# Patient Record
Sex: Female | Born: 1940 | Race: White | Hispanic: No | Smoking: Never smoker
Health system: Southern US, Community
[De-identification: ages and names within clinical notes are randomized; demographics above are authoritative.]

## PROBLEM LIST (undated history)

## (undated) DIAGNOSIS — F419 Anxiety disorder, unspecified: Secondary | ICD-10-CM

## (undated) DIAGNOSIS — K219 Gastro-esophageal reflux disease without esophagitis: Secondary | ICD-10-CM

## (undated) DIAGNOSIS — F32A Depression, unspecified: Secondary | ICD-10-CM

## (undated) DIAGNOSIS — E049 Nontoxic goiter, unspecified: Secondary | ICD-10-CM

## (undated) DIAGNOSIS — K5790 Diverticulosis of intestine, part unspecified, without perforation or abscess without bleeding: Secondary | ICD-10-CM

## (undated) DIAGNOSIS — C50919 Malignant neoplasm of unspecified site of unspecified female breast: Secondary | ICD-10-CM

## (undated) DIAGNOSIS — D126 Benign neoplasm of colon, unspecified: Secondary | ICD-10-CM

## (undated) DIAGNOSIS — M4800 Spinal stenosis, site unspecified: Secondary | ICD-10-CM

## (undated) DIAGNOSIS — M1990 Unspecified osteoarthritis, unspecified site: Secondary | ICD-10-CM

## (undated) DIAGNOSIS — E7800 Pure hypercholesterolemia, unspecified: Secondary | ICD-10-CM

## (undated) DIAGNOSIS — G4730 Sleep apnea, unspecified: Secondary | ICD-10-CM

## (undated) DIAGNOSIS — I10 Essential (primary) hypertension: Secondary | ICD-10-CM

## (undated) DIAGNOSIS — H02402 Unspecified ptosis of left eyelid: Secondary | ICD-10-CM

## (undated) DIAGNOSIS — M109 Gout, unspecified: Secondary | ICD-10-CM

## (undated) DIAGNOSIS — R112 Nausea with vomiting, unspecified: Secondary | ICD-10-CM

## (undated) DIAGNOSIS — M48061 Spinal stenosis, lumbar region without neurogenic claudication: Secondary | ICD-10-CM

## (undated) DIAGNOSIS — Z9889 Other specified postprocedural states: Secondary | ICD-10-CM

## (undated) DIAGNOSIS — E669 Obesity, unspecified: Secondary | ICD-10-CM

## (undated) DIAGNOSIS — R269 Unspecified abnormalities of gait and mobility: Principal | ICD-10-CM

## (undated) DIAGNOSIS — R5382 Chronic fatigue, unspecified: Secondary | ICD-10-CM

## (undated) DIAGNOSIS — F329 Major depressive disorder, single episode, unspecified: Secondary | ICD-10-CM

## (undated) DIAGNOSIS — R413 Other amnesia: Secondary | ICD-10-CM

## (undated) DIAGNOSIS — K559 Vascular disorder of intestine, unspecified: Secondary | ICD-10-CM

## (undated) HISTORY — DX: Spinal stenosis, lumbar region without neurogenic claudication: M48.061

## (undated) HISTORY — DX: Benign neoplasm of colon, unspecified: D12.6

## (undated) HISTORY — PX: ABDOMINAL HYSTERECTOMY: SHX81

## (undated) HISTORY — DX: Unspecified osteoarthritis, unspecified site: M19.90

## (undated) HISTORY — DX: Gout, unspecified: M10.9

## (undated) HISTORY — DX: Anxiety disorder, unspecified: F41.9

## (undated) HISTORY — DX: Unspecified abnormalities of gait and mobility: R26.9

## (undated) HISTORY — DX: Vascular disorder of intestine, unspecified: K55.9

## (undated) HISTORY — DX: Chronic fatigue, unspecified: R53.82

## (undated) HISTORY — DX: Spinal stenosis, site unspecified: M48.00

## (undated) HISTORY — PX: TUBAL LIGATION: SHX77

## (undated) HISTORY — DX: Major depressive disorder, single episode, unspecified: F32.9

## (undated) HISTORY — DX: Obesity, unspecified: E66.9

## (undated) HISTORY — DX: Other amnesia: R41.3

## (undated) HISTORY — DX: Essential (primary) hypertension: I10

## (undated) HISTORY — PX: BREAST CYST EXCISION: SHX579

## (undated) HISTORY — DX: Pure hypercholesterolemia, unspecified: E78.00

## (undated) HISTORY — DX: Depression, unspecified: F32.A

## (undated) HISTORY — DX: Gastro-esophageal reflux disease without esophagitis: K21.9

## (undated) HISTORY — DX: Diverticulosis of intestine, part unspecified, without perforation or abscess without bleeding: K57.90

## (undated) HISTORY — DX: Malignant neoplasm of unspecified site of unspecified female breast: C50.919

## (undated) HISTORY — DX: Sleep apnea, unspecified: G47.30

---

## 1968-12-26 HISTORY — PX: TUBAL LIGATION: SHX77

## 1998-06-29 ENCOUNTER — Ambulatory Visit: Admission: RE | Admit: 1998-06-29 | Discharge: 1998-06-29 | Payer: Self-pay | Admitting: Pulmonary Disease

## 2001-09-25 ENCOUNTER — Encounter: Payer: Self-pay | Admitting: Internal Medicine

## 2001-09-25 ENCOUNTER — Ambulatory Visit (HOSPITAL_COMMUNITY): Admission: RE | Admit: 2001-09-25 | Discharge: 2001-09-25 | Payer: Self-pay | Admitting: Internal Medicine

## 2001-10-01 ENCOUNTER — Encounter: Payer: Self-pay | Admitting: Internal Medicine

## 2001-10-01 ENCOUNTER — Ambulatory Visit (HOSPITAL_COMMUNITY): Admission: RE | Admit: 2001-10-01 | Discharge: 2001-10-01 | Payer: Self-pay | Admitting: Internal Medicine

## 2001-10-12 ENCOUNTER — Encounter: Payer: Self-pay | Admitting: General Surgery

## 2001-10-12 ENCOUNTER — Ambulatory Visit (HOSPITAL_COMMUNITY): Admission: RE | Admit: 2001-10-12 | Discharge: 2001-10-12 | Payer: Self-pay | Admitting: General Surgery

## 2002-03-21 ENCOUNTER — Ambulatory Visit (HOSPITAL_COMMUNITY): Admission: RE | Admit: 2002-03-21 | Discharge: 2002-03-21 | Payer: Self-pay | Admitting: Orthopaedic Surgery

## 2003-02-05 ENCOUNTER — Encounter: Payer: Self-pay | Admitting: Internal Medicine

## 2003-02-05 ENCOUNTER — Ambulatory Visit (HOSPITAL_COMMUNITY): Admission: RE | Admit: 2003-02-05 | Discharge: 2003-02-05 | Payer: Self-pay | Admitting: Internal Medicine

## 2003-02-12 ENCOUNTER — Encounter: Payer: Self-pay | Admitting: Internal Medicine

## 2003-02-12 ENCOUNTER — Ambulatory Visit (HOSPITAL_COMMUNITY): Admission: RE | Admit: 2003-02-12 | Discharge: 2003-02-12 | Payer: Self-pay | Admitting: Internal Medicine

## 2003-08-21 ENCOUNTER — Encounter: Payer: Self-pay | Admitting: Internal Medicine

## 2003-08-21 ENCOUNTER — Ambulatory Visit (HOSPITAL_COMMUNITY): Admission: RE | Admit: 2003-08-21 | Discharge: 2003-08-21 | Payer: Self-pay | Admitting: Internal Medicine

## 2003-08-26 ENCOUNTER — Ambulatory Visit (HOSPITAL_COMMUNITY): Admission: RE | Admit: 2003-08-26 | Discharge: 2003-08-26 | Payer: Self-pay | Admitting: Internal Medicine

## 2003-08-26 ENCOUNTER — Encounter: Payer: Self-pay | Admitting: Internal Medicine

## 2003-12-05 ENCOUNTER — Ambulatory Visit (HOSPITAL_COMMUNITY): Admission: RE | Admit: 2003-12-05 | Discharge: 2003-12-05 | Payer: Self-pay | Admitting: Internal Medicine

## 2004-09-21 ENCOUNTER — Ambulatory Visit (HOSPITAL_COMMUNITY): Admission: RE | Admit: 2004-09-21 | Discharge: 2004-09-21 | Payer: Self-pay | Admitting: Internal Medicine

## 2005-11-23 ENCOUNTER — Ambulatory Visit (HOSPITAL_COMMUNITY): Admission: RE | Admit: 2005-11-23 | Discharge: 2005-11-23 | Payer: Self-pay | Admitting: Internal Medicine

## 2006-12-05 ENCOUNTER — Ambulatory Visit (HOSPITAL_COMMUNITY): Admission: RE | Admit: 2006-12-05 | Discharge: 2006-12-05 | Payer: Self-pay | Admitting: Internal Medicine

## 2007-08-26 ENCOUNTER — Inpatient Hospital Stay (HOSPITAL_COMMUNITY): Admission: EM | Admit: 2007-08-26 | Discharge: 2007-08-28 | Payer: Self-pay | Admitting: Emergency Medicine

## 2007-08-27 ENCOUNTER — Ambulatory Visit: Payer: Self-pay | Admitting: Internal Medicine

## 2007-08-27 DIAGNOSIS — D126 Benign neoplasm of colon, unspecified: Secondary | ICD-10-CM

## 2007-08-27 HISTORY — PX: COLONOSCOPY: SHX174

## 2007-08-27 HISTORY — DX: Benign neoplasm of colon, unspecified: D12.6

## 2007-08-28 ENCOUNTER — Ambulatory Visit: Payer: Self-pay | Admitting: Gastroenterology

## 2007-09-03 ENCOUNTER — Ambulatory Visit: Payer: Self-pay | Admitting: Internal Medicine

## 2007-09-03 ENCOUNTER — Ambulatory Visit (HOSPITAL_COMMUNITY): Admission: RE | Admit: 2007-09-03 | Discharge: 2007-09-03 | Payer: Self-pay | Admitting: Internal Medicine

## 2007-09-03 ENCOUNTER — Encounter: Payer: Self-pay | Admitting: Internal Medicine

## 2008-04-11 ENCOUNTER — Ambulatory Visit (HOSPITAL_COMMUNITY): Admission: RE | Admit: 2008-04-11 | Discharge: 2008-04-11 | Payer: Self-pay | Admitting: Internal Medicine

## 2009-05-11 ENCOUNTER — Ambulatory Visit (HOSPITAL_COMMUNITY): Admission: RE | Admit: 2009-05-11 | Discharge: 2009-05-11 | Payer: Self-pay | Admitting: Internal Medicine

## 2009-05-18 ENCOUNTER — Ambulatory Visit (HOSPITAL_COMMUNITY): Admission: RE | Admit: 2009-05-18 | Discharge: 2009-05-18 | Payer: Self-pay | Admitting: Internal Medicine

## 2009-10-28 ENCOUNTER — Emergency Department (HOSPITAL_COMMUNITY): Admission: EM | Admit: 2009-10-28 | Discharge: 2009-10-28 | Payer: Self-pay | Admitting: Emergency Medicine

## 2010-07-02 ENCOUNTER — Ambulatory Visit (HOSPITAL_COMMUNITY): Admission: RE | Admit: 2010-07-02 | Discharge: 2010-07-02 | Payer: Self-pay | Admitting: Internal Medicine

## 2011-05-10 NOTE — H&P (Signed)
NAMETEDRA, COPPERNOLL                ACCOUNT NO.:  [id number]   MEDICAL RECORD NO.:  [id number]          PATIENT TYPE:  INP   LOCATION:  A310                          FACILITY:  [facility]   PHYSICIAN:  [name]. [name], MD       DATE OF BIRTH:  08/24/1948   DATE OF ADMISSION:  08/26/2007  DATE OF DISCHARGE:  09/02/2008LH                              HISTORY & PHYSICAL   CHIEF COMPLAINT:  Rectal bleeding.   HISTORY OF PRESENT ILLNESS:  This patient is a 70 year old white female  who presented to the emergency room after developing bloody diarrhea.  The patient had  been in her usual state of health until the morning of  admission.  She had gone to church and about 10 minutes into the service  felt lower abdominal discomfort.  She left the service, went home and  then developed diarrhea and nausea and vomiting.  She then developed  rectal bleeding.  She denied fever.  She had previously had a  colonoscopy in 2004 which showed no polyps.  She was evaluated in the  emergency room and ultimately had a CT scan which revealed evidence of  thickening of the wall of the descending colon consistent with colitis.   PAST MEDICAL HISTORY:  1. Hypertension.  2. Hyperlipidemia.  3. Sleep apnea.  4. Multinodular goiter.  5. Spinal stenosis.  6. GERD.  7. Depression/fatigue.  8. Hysterectomy.  9. Negative breast biopsies.   MEDICATIONS:  1. Plendil 10 mg daily.  2. Micardis HCT 80/25mg  daily.  3. Wellbutrin SR 150 mg b.i.d.  4. Prozac 60 mg daily.  5. Ritalin 10 mg b.i.d.  6. Multivitamin daily.  7. Calcium and vitamin D b.i.d.  8. Claritin 10 mg daily.  9. __________ .  10.Prilosec 20 mg daily.   ALLERGIES:  None.   SOCIAL HISTORY:  She does not smoke or drink.  She lives in [place] with her husband.  She does not use recreational drugs.   FAMILY HISTORY:  Her father had heart disease.  Her mother has had  atrial flutter and a meningioma.  A brother died of non-Hodgkin's  lymphoma  and a brother has had diabetes and heart disease.   REVIEW OF SYSTEMS:  No syncope, shortness of breath or chest pain.  She  did initially develop a bout of sweating.  No dysuria or hematuria.   PHYSICAL EXAMINATION:  VITAL SIGNS:  Temperature 98.3, pulse 102,  respirations 20, blood pressure 162/83.  GENERAL:  Alert and oriented.  HEENT:  No scleral icterus.  Pharynx is unremarkable.  NECK:  No JVD.  She has a multinodular goiter.  LUNGS:  Clear.  HEART:  Regular with no murmurs.  ABDOMEN:  Active bowel sounds.  Nondistended.  No hepatosplenomegaly.  There is mild diffuse tenderness.  EXTREMITIES:  No cyanosis, clubbing or edema.  NEUROLOGIC:  Intact.  LYMPH NODES:  No cervical or supraclavicular enlargement.  SKIN:  Warm and dry.   LABORATORY DATA:  White count 9,  hemoglobin 13.1, platelets 183,000.  Sodium 138, potassium 3.3, bicarb 27, BUN 6,  creatinine 0.7, glucose  110, calcium 8.4.  Prothrombin time 12.6.  Urinalysis reveals 7-10 white  cells, 7-10 red cells, many bacteria, negative nitrite, and a trace  leukocyte esterase.   IMPRESSION:  1. Colitis,  likely ischemic in origin.  She was started empirically      in the emergency room on Cipro and Flagyl.  She has been treated      with morphine, if needed, and Zofran, if needed.  A GI consultation      has been requested.  Stool studies will be obtained.  2. Hypokalemia.  Will supplement intravenously.  3. Hypertension.  Continue Micardis and Plendil.  4. Sleep apnea.  Continue CPAP.  5. Multinodular goiter.  6. Spinal stenosis.  7. GERD.  Continue PPI.  8. History of depression/fatigue.  Continue Prozac, Wellbutrin SR and      Ritalin.      [name]. [name], MD  Electronically Signed     ROF/MEDQ  D:  08/27/2007  T:  08/28/2007  Job:  [id number]

## 2011-05-10 NOTE — Op Note (Signed)
Crystal [name], Crystal [name]                ACCOUNT NO.:  [id number]   MEDICAL RECORD NO.:  [id number]          PATIENT TYPE:  AMB   LOCATION:  DAY                           FACILITY:  [facility]   PHYSICIAN:  R. Roetta Sessions, M.D. DATE OF BIRTH:  1941-08-16   DATE OF PROCEDURE:  08/31/2007  DATE OF DISCHARGE:                               OPERATIVE REPORT   PROCEDURE:  Colonoscopy with biopsy and snare polypectomy.   INDICATIONS FOR PROCEDURE:  A 70 year old lady admitted to the hospital  recently with abdominal cramps, diarrhea followed by a gross  hematochezia. CT demonstrated thickening of the descending colon. Her  symptoms have resolved with conservative therapy which would be expected  with ischemic or segmental colitis. She does not smoke. Colonoscopy is  now being done to further evaluate her symptoms and CT findings. This  approach has been discussed with the patient at length. The potential  risks, benefits, and alternatives have been reviewed, questions answered  and she is agreeable. Please see documentation in the medical records.   MONITORING:  O2 saturation, blood pressure, pulse and respirations were  monitored throughout the entire procedure.   CONSCIOUS SEDATION:  Versed 6 mg IV, Demerol 100 mg IV in divided doses.   INSTRUMENT:  Pentax video chip system.   FINDINGS:  Digital rectal exam revealed no abnormalities.   ENDOSCOPIC FINDINGS:  The prep was good.   COLON:  The colonic mucosa was surveyed from the rectosigmoid junction  through the left transverse and right colon to the area of the  appendiceal orifice, ileocecal valve and cecum. These structures were  well seen and photographed for the record. From this level the scope was  slowly withdrawn and all previously mentioned mucosal surfaces were  again seen. The patient had extensive left-sided diverticula. She had  inflammatory changes involving a good 20-cm segment if descending colon  with granularity, friability  and loss of normal vascular pattern. There  were no ulcers and this segment was markedly inflamed up stream of the  splenic flexure. The colonic mucosa appeared entirely normal until the  cecum was reached. In the cecum, there was a 6-mm polyp in the base with  the cecum adjacent to the appendix. It was partially removed with cold  snare technique and subsequently cold biopsied. The inflamed segment of  descending colon was biopsied for histologic study. The remainder of the  colonic mucosa appeared normal. The scope was pulled into the rectum  where a thorough examination of the rectal mucosa including a rectal  view of the anal verge demonstrated no abnormalities. The patient  tolerated the procedure well and was reacted in endoscopy.   IMPRESSION:  Normal rectum, left-sided diverticula, inflamed segment of  descending colon consistent with segmental ischemic colon, it is  biopsied. Cecal polyp status post removal as described above.   RECOMMENDATIONS:  1. Diverticulosis literature provided to Ms. Brubacher. Followup on path.  2. Her segmental colitis should be a self limiting phenomena. If she      has any future symptoms then further evaluation of her mesenteric  vascular supply would be in order.      Jonathon Bellows, M.D.  Electronically Signed     RMR/MEDQ  D:  09/03/2007  T:  09/04/2007  Job:  [id number]   cc:   Rhae Lerner. Margretta Ditty, M.D.  [address]  [place]  [place] [address]   [name]. [name], MD  Fax: [phone]

## 2011-05-10 NOTE — Consult Note (Signed)
[name], [name]                ACCOUNT NO.:  [id number]   MEDICAL RECORD NO.:  [id number]          PATIENT TYPE:  INP   LOCATION:  A310                          FACILITY:  [facility]   PHYSICIAN:  R. Roetta Sessions, M.D. DATE OF BIRTH:  1941-05-29   DATE OF CONSULTATION:  08/27/2007  DATE OF DISCHARGE:                                 CONSULTATION   REASON FOR CONSULTATION:  1. Abdominal pain.  2. Rectal bleeding.  3. Abnormal CT.   HISTORY OF PRESENT ILLNESS:  Crystal [name] is a pleasant 70-year-  old Caucasian female, who was in her usual state of health until she was  in church yesterday, late morning, and developed lower abdominal cramps  and an urgent need to have a bowel movement.  She went home and had  multiple loose, non-bloody stools, which rapidly turned into gross  blood.  She has had multiple bloody bowel movements over the past 24  hours.  She presented to the ED last night and saw Dr. Margretta Ditty, where  she underwent evaluation.   She was found to have a white count of 9000, H&H of 13.5 and 39.5.  She  had some mild diffuse abdominal pain and she underwent a CT scan of the  abdomen and pelvis, which demonstrated thickening and inflammatory  changes involving the descending colon from hepatic flexure down to the  sigmoid.  No obvious abscess or anything concerning for neoplastic  process.  Dr. Margretta Ditty called me, where he had felt this was most likely  ischemic colitis and has set her up for a colonoscopy later in the week,  but this morning I came in and noted that she subsequently had been  admitted to the hospital.  She has remained quite stable over night.  This morning, her white count is 9000, her H&H is 13.1 and 38.2, MCH  91.2.  Abdominal cramps have diminished with morphine and she has had a  couple of episodes, small amounts of blood per rectum this morning.   She is a nonsmoker.  She has never had any similar symptoms previously.  In reviewing the old  records, it is notable that I performed a screening  colonoscopy on this nice lady back on December 05, 2003.  She had a long-  capacious colon with sigmoid diverticular.  It was recommended that she  return for a routine repeat colonoscopy in ten years.   PAST MEDICAL HISTORY:  Her past medical history is significant for:  1. Hypertension.  2. Gastroesophageal reflux disease.  3. She has a history of osteoarthritis.   PAST SURGICAL HISTORY:  Past surgeries include:  1. Bilateral breast biopsies for benign disease.  2. Bilateral tubal ligation.  3. Partial hysterectomy.   CURRENT MEDICATIONS:  Bupropion, fluoxetine, Omeprazole, felodipine, ASA  81 mg daily, Micardis and HCTZ.   ALLERGIES:  No known drug allergies.   FAMILY HISTORY:  Negative for chronic GI or liver illness, presumably  negative for colon cancer or inflammatory bowel disease.   REVIEW OF SYSTEMS:  Negative for chest pain, dyspnea on exertion, no  fever or  chills.  No odynophagia or dysphagia.  Has not had any melena.  Chronically, does not have any bowel dysfunction.   PHYSICAL EXAMINATION:  GENERAL APPEARANCE:  A 70 year old lady, resting  comfortably, accompanied by her husband and sister.  VITAL SIGNS:  Temperature 98.3, pulse of 90, respiratory rate of 20, BP  140/68.  SKIN:  Warm and dry.  There is no jaundice.  HEENT EXAM:  No scleral icterus.  Conjunctivae are pink.  Oral cavity  with no lesions.  CHEST:  Lungs are clear to auscultation.  HEART:  Regular rate and rhythm without murmurs, gallops or rubs.  ABDOMEN:  Obese.  Positive bowel sounds, soft, really nontender to deep  palpation without appreciable mass or organomegaly.  EXTREMITIES:  Have no edema.   LABORATORY DATA:  CBC is as outlined above.  BMP today, sodium 138,  potassium 3.3, chloride 104, CO2 27, glucose 110, BUN 6, calcium 8.4.  ProTime on admission 0.9.  Urinalysis is negative.   I have reviewed the CT scan via telephone with Dr.  Margo Aye, a radiologist  down in [place].  Currently, the CT scan images are not available on  the computer system here.  Again, this confirms the thickening and  inflammatory changes about the descending segment with sparing of the  more distant proximal colon.  No obvious metastatic disease and no  abscess.  The mesenteric vasculature is patent with minimal plaquing.   IMPRESSION:  Crystal [name] is a very pleasant 70 year old lady, who  most certainly has presented with ischemic or segmental colitis.  Her CT  findings and clinical presentation are classic.  She does not really  appear to be acutely toxic at this time.  We discussed outpatient  evaluation to some length last evening with Dr. Margretta Ditty.   RECOMMENDATIONS:  Would rapidly advance her from a clear liquid to a low-  residue diet in the next 24 hours.  She has been started on Cipro and  Flagyl, but the antibiotics are really superfluous in this setting and I  have taken the liberty of stopping them.  Hopefully, this nice lady can  go home within the next 24 hours on relatively mild oral analgesics.  We  will make plans to go ahead and perform a diagnostic colonoscopy in the  very near future (i.e.: at  the end of the week), to confirm the diagnosis.  I explained to Mrs.  Crystal [name] and her family members the underlying cause of this illness and it  is almost always self-limiting and non-recurrent.  Her outlook I feel is  excellent.   I would like to thank Dr. Carylon Perches for allowing me to see this nice  lady today.      Jonathon Bellows, M.D.  Electronically Signed     RMR/MEDQ  D:  08/27/2007  T:  08/27/2007  Job:  [id number]   cc:   Rhae Lerner. Margretta Ditty, M.D.  [address]  [place]  [place] [address]   [name]. [name], MD  Fax: [phone]

## 2011-05-10 NOTE — Discharge Summary (Signed)
Crystal [name], Crystal [name]                ACCOUNT NO.:  [id number]   MEDICAL RECORD NO.:  [id number]          PATIENT TYPE:  INP   LOCATION:  A310                          FACILITY:  [facility]   PHYSICIAN:  [name]. [name], MD       DATE OF BIRTH:  1941/05/12   DATE OF ADMISSION:  08/26/2007  DATE OF DISCHARGE:  09/02/2008LH                               DISCHARGE SUMMARY   DISCHARGE DIAGNOSES:  1. Ischemic colitis.  2. Hypertension.  3. Gastroesophageal reflux disease.  4. Hypokalemia.  5. Depression/fatigue.  6. Urinary tract infection.  7. Gallstones.  8. Degenerative joint disease/spinal stenosis.   PROCEDURES:  Abdominal and pelvic CT.   HOSPITAL COURSE:  This patient is a 70 year old female who presented  with abdominal pain and hematochezia.  Her hemoglobin was 13.5.  She was  normotensive and afebrile.  She underwent a CT scan of the abdomen and  pelvis in the ER and was found to have wall thickening of the descending  colon.  This was felt to be consistent with ischemic colitis.  She was  seen in consultation by Dr. Jena Gauss.  A followup colonoscopy is planned.  Her repeat hemoglobins remained in the normal range.  She was mildly  hyperkalemic at 3.3 and was supplemented intravenously.  Aspirin was  held.  Her symptoms improved.  She was felt be stable for discharge on  the 2nd.   Her abdominal CT also revealed a hiatal hernia, gallstones and DJD of  the spine.   Her urinalysis revealed 7-10 white cells and red cells with many  bacteria.   Stool studies were obtained and are pending except for the lactoferrin  which was positive.   DISCHARGE MEDICATIONS:  1. Cipro 250 b.i.d. for 1 week.  2. Wellbutrin SR 150 mg b.i.d.  3. Prozac 60 mg daily  4. Prilosec 20 mg daily  5. Loratadine 10 mg daily  6. Plendil 10 mg daily  7. Micardis HCT 80/25 mg daily  8. Calcium with vitamin D b.i.d.  9. Multivitamin daily  10.Ritalin 10 mg b.i.d.  11.Fish oil  12.Horse chestnut  13.Osteo  Bi-Flex  14.Tylenol as needed.  15.She will hold aspirin for 1 week.   FOLLOW UP:  She will be seen by Dr. Jena Gauss for colonoscopy.  She will  follow up with me as scheduled.  She was encouraged to eat a low residue  diet.      [name]. [name], MD  Electronically Signed     ROF/MEDQ  D:  08/28/2007  T:  08/28/2007  Job:  [id number]   cc:   R. Roetta Sessions, M.D.  [address]  [place]  [place] [address]

## 2011-05-13 NOTE — H&P (Signed)
[facility]  Patient:    Crystal [name], Crystal [name] Visit Number: [id number] MRN: [id number]          Service Type: OUT Location: RAD Attending Physician:  Carylon Perches Dictated by:   Franky Macho, M.D. Admit Date:  10/01/2001 Discharge Date: 10/01/2001   CC:         Carylon Perches, M.D.   History and Physical  AGE:  70 years old.  CHIEF COMPLAINT:  Abnormal mammogram of left breast.  HISTORY OF PRESENT ILLNESS:  Patient is a 70 year old white female who was referred for evaluation and treatment of an abnormal mammogram of the left breast.  Microcalcifications were seen in the left breast, new since previous mammogram.  She has a history of breast biopsy on both breasts in the past, negative for malignancy.  She does not feel a lump.  No nipple discharge or family history of breast carcinoma is noted.  She has three children, first pregnant at age 35, never breast-fed.  She had a hysterectomy 12 years ago.  PAST MEDICAL HISTORY:  Hypertension, reflux disease, osteoarthritis.  PAST SURGICAL HISTORY:  Right breast biopsy, left breast biopsy, partial hysterectomy.  CURRENT MEDICATIONS:  Effexor, hydrochlorothiazide, Paxil, Plendil, Nexium, baby aspirin, calcium supplements, Unicap vitamin.  ALLERGIES:  No known drug allergies.  REVIEW OF SYSTEMS:  Patient denies drinking or smoking.  She denies any other cardiopulmonary difficulties, MI, chest pain, breathing disorder, shortness of breath, CVA, or diabetes mellitus.  PHYSICAL EXAMINATION:  GENERAL:  Patient is a well-developed, well-nourished white female in no acute distress.  She is afebrile.  VITAL SIGNS:  Stable.  LUNGS:  Clear to auscultation with equal breath sounds bilaterally.  HEART:  Examination reveals a regular rate and rhythm without S3, S4, or murmurs.  NECK:  Supple without lymphadenopathy.  BREASTS:  Right breast examination reveals no dominant mass, nipple discharge, or dimpling.  The  axilla is negative for palpable nodes.  Left breast examination reveals no dominant mass, nipple discharge, or dimpling.  The axilla is negative for palpable nodes.  LABORATORY DATA:  Mammography reveals suspicious microcalcifications in the lateral aspect of the left breast, new since 2001.  IMPRESSION:  Left breast mass, nonpalpable.  PLAN:  The patient is scheduled to undergo left breast biopsy after needle localization on October 12, 2001.  The risks and benefits of the procedure including bleeding and infection were fully explained to the patient, gained informed consent. Dictated by:   Franky Macho, M.D. Attending Physician:  Carylon Perches DD:  10/09/01 TD:  10/09/01 Job: [id number]

## 2011-05-13 NOTE — Op Note (Signed)
[facility]  Patient:    Crystal [name], Crystal [name] Visit Number: [id number] MRN: [id number]          Service Type: DSU Location: DAY Attending Physician:  Dalia Heading Dictated by:   Franky Macho, M.D. Proc. Date: 10/12/01 Admit Date:  10/12/2001   CC:         Carylon Perches, M.D.   Operative Report  AGE:  70 years old.  PREOPERATIVE DIAGNOSIS:  Left breast mass, nonpalpable.  POSTOPERATIVE DIAGNOSIS:  Left breast mass, nonpalpable.  PROCEDURE:  Left breast biopsy after needle localization.  SURGEON:  Franky Macho, M.D.  ANESTHESIA:  MAC.  INDICATIONS:  Patient is a 70 year old white female who presents with new microcalcifications in the left breast.  The risks and benefits of the procedure including bleeding, infection, and recurrence of the mass were fully explained to the patient, who gave informed consent.  DESCRIPTION OF PROCEDURE:  Patient was placed in the supine position after undergoing needle localization in the x-ray department.  The left breast was prepped and draped using the usual sterile technique with Betadine.  Xylocaine 1% was used for local anesthesia.  An incision was made where the guidewire was placed, which was at the 2 oclock position, just outside the areola.  The dissection was taken down and the guidewire was followed to a representative section of breast tissue. This was sent and the initial biopsy did not have the microcalcifications contained.  Additional breast tissue was removed and this did contain the microcalcifications that were suspicious.  Any bleeding was controlled using Bovie electrocautery.  The skin was closed using a 4-0 Vicryl subcuticular suture.  Steri-Strips and dry sterile dressing were applied.  All ______ and needle counts were correct at the end of the procedure.  The patient was awakened and transferred to PACU in stable condition.  COMPLICATIONS:  None.  SPECIMEN:  Left breast  mass.  BLOOD LOSS:  Minimal. Dictated by:   Franky Macho, M.D. Attending Physician:  Dalia Heading DD:  10/12/01 TD:  10/13/01 Job: [id number]

## 2011-05-13 NOTE — Op Note (Signed)
NAME:  Crystal [name], Crystal [name]                          ACCOUNT NO.:  [id number]   MEDICAL RECORD NO.:  [id number]                   PATIENT TYPE:  AMB   LOCATION:  DAY                                  FACILITY:  [facility]   PHYSICIAN:  R. Roetta Sessions, M.D.              DATE OF BIRTH:  1941/03/04   DATE OF PROCEDURE:  12/05/2003  DATE OF DISCHARGE:                                 OPERATIVE REPORT   PROCEDURE:  Screening colonoscopy.   INDICATIONS FOR PROCEDURE:  The patient is a 70 year old lady devoid of any  lower GI tract symptoms who is sent over by the courtesy of Dr. Carylon Perches  for colorectal cancer screening.  There is no family history of colorectal  neoplasia.  She has sigmoidoscopy performed by Dr. [name] previously but has  never had her entire lower GI tract imaged.  Consequently, colonoscopy is  now being performed.  This approach has been discussed with the patient at  length previously and again today at the bedside.  The potential risks,  benefits, and alternatives have been reviewed and questions answered.  She  is agreeable.  Please see my handwritten H&P for more information.   PROCEDURE:  O2 saturation, blood pressure, pulses, and respirations were  monitored throughout the entire procedure.  Conscious sedation was with  Versed 4 mg IV, Demerol 100 mg IV in divided doses.  The instrument used was  the Olympus video chip pediatric colonoscope.   FINDINGS:  Digital rectal examination revealed no abnormalities.   ENDOSCOPIC FINDINGS:  The prep was marginal, particularly in the right  colon.   Rectum:  Examination of the rectal mucosa including retroflex view of the  anal verge revealed no abnormalities.   Colon:  The colonic mucosa was surveyed from the rectosigmoid junction  through the left, transverse, right colon to the area of the appendiceal  orifice, ileocecal valve, and cecum.  These structures were well-seen and  photographed for the record.  From this level, the  scope was slowly  withdrawn.  All previously mentioned mucosal surfaces were again seen.   The patient had a relatively tight sigmoid colon with numerous left-sided  diverticula.  The more proximal colon was somewhat elongated and capacious.  The mucosa, however, appeared normal.  The patient tolerated the procedure  well and was reactive in endoscopy.   IMPRESSION:  1. Normal rectum.  2. Sigmoid diverticula.  3. Elongated, tortuous, capacious more proximal colon.  Otherwise normal     mucosa.   RECOMMENDATIONS:  1. Diverticulosis literature provided to Ms. Moronta.  2. Repeat colonoscopy in 10 years.      ___________________________________________                                            Jonathon Bellows, M.D.  RMR/MEDQ  D:  12/05/2003  T:  12/05/2003  Job:  [id number]   cc:   [name]. [name], M.D.  [address]  [place]  [place] [address]  Fax: [phone]

## 2011-05-28 ENCOUNTER — Inpatient Hospital Stay (HOSPITAL_COMMUNITY)
Admission: EM | Admit: 2011-05-28 | Discharge: 2011-05-30 | DRG: 394 | Disposition: A | Discharge status: Home / Self Care | Payer: Medicare Other | Attending: Internal Medicine | Admitting: Internal Medicine

## 2011-05-28 ENCOUNTER — Emergency Department (HOSPITAL_COMMUNITY): Payer: Medicare Other

## 2011-05-28 DIAGNOSIS — I10 Essential (primary) hypertension: Secondary | ICD-10-CM | POA: Diagnosis present

## 2011-05-28 DIAGNOSIS — K559 Vascular disorder of intestine, unspecified: Secondary | ICD-10-CM

## 2011-05-28 DIAGNOSIS — R5383 Other fatigue: Secondary | ICD-10-CM | POA: Diagnosis present

## 2011-05-28 DIAGNOSIS — E876 Hypokalemia: Secondary | ICD-10-CM | POA: Diagnosis not present

## 2011-05-28 DIAGNOSIS — F329 Major depressive disorder, single episode, unspecified: Secondary | ICD-10-CM | POA: Diagnosis present

## 2011-05-28 DIAGNOSIS — R109 Unspecified abdominal pain: Secondary | ICD-10-CM

## 2011-05-28 DIAGNOSIS — N390 Urinary tract infection, site not specified: Secondary | ICD-10-CM | POA: Diagnosis present

## 2011-05-28 DIAGNOSIS — K219 Gastro-esophageal reflux disease without esophagitis: Secondary | ICD-10-CM | POA: Diagnosis present

## 2011-05-28 DIAGNOSIS — R5381 Other malaise: Secondary | ICD-10-CM | POA: Diagnosis present

## 2011-05-28 DIAGNOSIS — M1990 Unspecified osteoarthritis, unspecified site: Secondary | ICD-10-CM | POA: Diagnosis present

## 2011-05-28 DIAGNOSIS — F3289 Other specified depressive episodes: Secondary | ICD-10-CM | POA: Diagnosis present

## 2011-05-28 LAB — TYPE AND SCREEN: Antibody Screen: NEGATIVE

## 2011-05-28 LAB — BASIC METABOLIC PANEL
BUN: 13 mg/dL (ref 6–23)
CO2: 28 mEq/L (ref 19–32)
Calcium: 10.2 mg/dL (ref 8.4–10.5)
Chloride: 97 mEq/L (ref 96–112)
GFR calc Af Amer: >60 mL/min (ref >60)
GFR calc non Af Amer: >60 mL/min (ref >60)
Sodium: 137 mEq/L (ref 135–145)

## 2011-05-28 LAB — URINALYSIS, ROUTINE W REFLEX MICROSCOPIC
Bilirubin Urine: NEGATIVE
Nitrite: NEGATIVE
Protein, ur: 100 mg/dL — AB
Urobilinogen, UA: 1 mg/dL (ref 0.0–1.0)
pH: 8.5 — ABNORMAL HIGH (ref 5.0–8.0)

## 2011-05-28 LAB — HEPATIC FUNCTION PANEL
ALT: 23 U/L (ref 0–35)
AST: 26 U/L (ref 0–37)
Bilirubin, Direct: 0.2 mg/dL (ref 0.0–0.3)

## 2011-05-28 LAB — URINE MICROSCOPIC-ADD ON

## 2011-05-28 LAB — DIFFERENTIAL
Basophils Relative: 0 % (ref 0–1)
Lymphs Abs: 1 10*3/uL (ref 0.7–4.0)
Monocytes Absolute: 0.9 10*3/uL (ref 0.1–1.0)
Neutrophils Relative %: 87 % — ABNORMAL HIGH (ref 43–77)

## 2011-05-28 LAB — CBC
HCT: 41.2 % (ref 36.0–46.0)
MCV: 92.6 fL (ref 78.0–100.0)
Platelets: 223 10*3/uL (ref 150–400)
RDW: 13.1 % (ref 11.5–15.5)

## 2011-05-28 MED ORDER — IOHEXOL 300 MG/ML  SOLN
100.0000 mL | Freq: Once | INTRAMUSCULAR | Status: AC | PRN
  Administered 2011-05-28: 100 mL via INTRAVENOUS

## 2011-05-29 LAB — DIFFERENTIAL
Basophils Absolute: 0 10*3/uL (ref 0.0–0.1)
Basophils Relative: 0 % (ref 0–1)
Lymphs Abs: 2.8 10*3/uL (ref 0.7–4.0)
Monocytes Absolute: 0.9 10*3/uL (ref 0.1–1.0)
Monocytes Relative: 8 % (ref 3–12)
Neutrophils Relative %: 67 % (ref 43–77)

## 2011-05-29 LAB — CBC
HCT: 37.6 % (ref 36.0–46.0)
MCHC: 33.5 g/dL (ref 30.0–36.0)
MCV: 93.3 fL (ref 78.0–100.0)
Platelets: 172 10*3/uL (ref 150–400)
RDW: 13.3 % (ref 11.5–15.5)
WBC: 11.6 10*3/uL — ABNORMAL HIGH (ref 4.0–10.5)

## 2011-05-29 LAB — URINE CULTURE: Colony Count: 30000

## 2011-05-29 LAB — BASIC METABOLIC PANEL
BUN: 7 mg/dL (ref 6–23)
Chloride: 99 mEq/L (ref 96–112)
Creatinine, Ser: 0.73 mg/dL (ref 0.4–1.2)
GFR calc non Af Amer: >60 mL/min (ref >60)
Potassium: 3.1 mEq/L — ABNORMAL LOW (ref 3.5–5.1)

## 2011-05-30 ENCOUNTER — Encounter: Payer: Self-pay | Admitting: Urgent Care

## 2011-05-30 ENCOUNTER — Telehealth: Payer: Self-pay | Admitting: Urgent Care

## 2011-05-30 LAB — CBC
HCT: 34.9 % — ABNORMAL LOW (ref 36.0–46.0)
Hemoglobin: 11.7 g/dL — ABNORMAL LOW (ref 12.0–15.0)
MCHC: 33.5 g/dL (ref 30.0–36.0)
MCV: 93.3 fL (ref 78.0–100.0)
RBC: 3.74 MIL/uL — ABNORMAL LOW (ref 3.87–5.11)

## 2011-05-30 LAB — DIFFERENTIAL
Eosinophils Relative: 4 % (ref 0–5)
Monocytes Relative: 10 % (ref 3–12)

## 2011-05-30 NOTE — Telephone Encounter (Signed)
Pt is aware of OV for 06/27/11 @ 3 with KJ

## 2011-05-30 NOTE — Telephone Encounter (Signed)
Pt being d/c'd from [facility] today. Will need FU OV w/ [place] 4 weeks re:FU ischemic colitis

## 2011-06-12 NOTE — Discharge Summary (Signed)
  [name], Crystal [name]                ACCOUNT NO.:  [id number]  MEDICAL RECORD NO.:  [id number]  LOCATION:  A302                          FACILITY:  [facility]  PHYSICIAN:  [name]. [name], MD       DATE OF BIRTH:  [birth year]  DATE OF ADMISSION:  05/28/2011 DATE OF DISCHARGE:  06/04/2012LH                              DISCHARGE SUMMARY   DISCHARGE DIAGNOSES: 1. Ischemic colitis. 2. Hypertension. 3. Depression. 4. Gastroesophageal reflux disease. 5. Hypokalemia. 6. Chronic fatigue. 7. Urinary tract infection. 8. Osteoarthritis. 9. Spinal stenosis.  PROCEDURES:  CT of the abdomen and pelvis.  DISCHARGE MEDICATIONS: 1. Cipro 500 mg b.i.d. for 5 days. 2. Bupropion SR 150 mg b.i.d. 3. Calcium carbonate b.i.d. 4. Felodipine 10 mg daily. 5. Fish oil 1000 mg b.i.d. 6. Fluoxetine 60 mg daily. 7. Hydrocodone/homatropine 5/1.5, 1 teaspoon q.4 p.r.n. 8. Loratadine 10 mg daily. 9. Micardis HCT 80/25 mg daily. 10.Omeprazole 20 mg daily. 11.Simvastatin 20 mg daily. 12.Tylenol Arthritis 2 tablets b.i.d. 13.Stop methylphenidate.  HOSPITAL COURSE:  This patient is a 70 year old female with history of ischemic colitis 4 years ago who presented with left-sided abdominal pain followed by rectal bleeding.  She presented to the emergency room where she was found to have a hemoglobin of 14 with a white count of 14.9.  She underwent a CT scan of the abdomen and pelvis which revealed diffuse acute colitis in the descending colon.  There is no evidence of perforation or obstruction.  She was seen in Gastroenterology consultation by Dr. Karilyn Cota.  Her hemoglobin dropped to 12.6, then 11.7. Her bleeding stopped.  Her abdominal pain resolved.  She was hemodynamically stable.  Her white count normalized at 8.6.  She had an abnormal urinalysis on admission with 21-50 white cells and too numerous to count RBCs and many bacteria.  Her culture has shown 30,000 colonies of multiple species.  She has been treated  with Cipro.  Her diet has been advanced to full liquids which she has tolerated well. She was hyperkalemic at 3.1 on the third.  She has been supplemented with oral potassium.  Her potassium had been normal at 3.6 on admission.  Her condition is significantly improved.  She is stable for discharge on the 4th.  She will be seen in followup in my office in a week.  She will stop aspirin.  She will also stop methylphenidate.     [name]. [name], MD     ROF/MEDQ  D:  05/30/2011  T:  05/30/2011  Job:  [id number]  Electronically Signed by Carylon Perches MD on 06/12/2011 10:43:48 AM

## 2011-06-20 NOTE — H&P (Signed)
  NAMEELEXIS, POLLAK                ACCOUNT NO.:  [id number]  MEDICAL RECORD NO.:  [id number]           PATIENT TYPE:  I  LOCATION:  A302                          FACILITY:  [facility]  PHYSICIAN:  GEBRESELASSIE NIDA, MD DATE OF BIRTH:  [birth year]  DATE OF ADMISSION:  05/28/2011 DATE OF DISCHARGE:  LH                             HISTORY & PHYSICAL   REASON FOR HOSPITALIZATION:  Descending colitis and urinary tract infection.  HISTORY OF PRESENT ILLNESS:  This is a 70 year old Caucasian female with medical history significant for hypertension, anxiety, prior episode of descending colitis   Dictation ended at this point.                                           ______________________________ Purcell Nails, MD     GN/MEDQ  D:  05/28/2011  T:  05/29/2011  Job:  [id number]  Electronically Signed by Purcell Nails MD on 06/20/2011 10:22:11 AM

## 2011-06-20 NOTE — H&P (Signed)
Crystal [name], HOLBEIN                ACCOUNT NO.:  [id number]  MEDICAL RECORD NO.:  [id number]           PATIENT TYPE:  I  LOCATION:  A302                          FACILITY:  [facility]  PHYSICIAN:  Tatyana Biber, MD DATE OF BIRTH:  14-Aug-1941  DATE OF ADMISSION:  05/28/2011 DATE OF DISCHARGE:  LH                             HISTORY & PHYSICAL   REASON FOR ADMISSION:  Colitis and urinary tract infection.  HISTORY OF PRESENT ILLNESS:  She is a 70 year old female patient with medical history significant for hypertension, hyperlipidemia, prior diverticulosis, and descending colitis who comes in complaining of abdominal pain which was progressive in onset, pattern was waxing and waning, mainly on the left lower quadrant.  Since last night, she had hematochezia of approximately 6 times, which triggered the ER visit. She did not have any vomiting, however, had some loose stools associated with the rectal bleeding.  Denies any trauma.  The patient had significant prior similar history approximately 5 years ago for which she was treated for descending colitis.  PAST MEDICAL HISTORY:  As above.  PAST SURGICAL HISTORY:  Colonoscopy, hysterectomy, and tubal ligation.  SOCIAL HISTORY:  Negative for smoking, alcohol, or drug use.  REVIEW OF SYSTEMS:  Negative for fevers or chills.  No nausea.  No vomiting.  No cough.  No shortness of breath.  She had abdominal pain, see HPI.  She has no rash.  No polyuria or polydipsia, however, she had sign of urinary tract infection on ER evaluation.  FAMILY HISTORY:  Noncontributory.  HOME MEDICATIONS: 1. Loratadine 10 mg once a day. 2. Fluoxetine 60 mg once a day. 3. Simvastatin 20 mg once a day. 4. Tylenol Arthritis. 5. Hydrocodone. 6. Calcium plus D. 7. Osteo Bi-Flex. 8. Fish oil. 9. Felodipine. 10.Omeprazole. 11.Methylene. 12.Bupropion. 13.Aspirin. 14.Micardis.  PHYSICAL EXAMINATION:  GENERAL:  She is alert and oriented x3. VITAL SIGNS:   Blood pressure 153/76, pulse rate 73, temperature 98.4, and oxygen saturation 95. HEENT:  Dry mucous membranes. NECK:  Negative for JVD or thyromegaly. CHEST:  Clear to auscultation bilaterally. CARDIOVASCULAR:  Normal S1 and S2.  No murmur, no gallop. ABDOMEN:  Tender on the left lower quadrant.  No rebound tenderness.  No sign of organomegaly or ascites. EXTREMITIES:  No edema. CENTRAL NERVOUS SYSTEM:  Nonfocal.  She moves all extremities with no limitation.  LABORATORY DATA:  Sodium 137, potassium 3.6, chloride 97, bicarb 28, BUN 13, creatinine 0.78, lipase 10, calcium 10.2.  AST and ALT normal.  WBC 14.9, hemoglobin 14, hematocrit 41, and platelet count 223.  Her CT of abdomen and pelvis with contrast showed diffuse acute colitis of the left descending colon, etiology unclear, suspect infectious inflammatory or ischemic.  She has no associated obstruction, perforation, or fluid collection.  She had small hiatal hernia and cystic enlargement of the right kidney.  ASSESSMENT AND PLAN: 1. 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  I will     keep the patient n.p.o., however, with dextrose support and     adequate hydration.  She will be allowed for ice chips by mouth.     Surgical consult  is already requested.  She may need mesenteric     angiography to evaluate atherosclerotic occlusion leading to     ischemic colitis. 2. Urinary tract infection as documented by urine studies which showed     21-50 wbc per high-powered field considered significant as well as     many bacteria per high powered field.  I will continue with Cipro     400 mg IV b.i.d. 3. Hypertension.  We will continue on her home medications involving     Micardis and felodipine. 4. Hyperlipidemia.  We will continue her home medications, simvastatin     20 mg daily. 5. DVT prophylaxis will not be done due to rectal bleeding. 6. Rectal bleeding due to descending colitis.  We will continue to      monitor hemoglobin/hematocrit every 12 hours. 7. Diet and nutrition.  N.p.o. for bowel rest and awaiting surgical     evaluation.                                           ______________________________ Purcell Nails, MD     GN/MEDQ  D:  05/28/2011  T:  05/29/2011  Job:  [id number]  Electronically Signed by Purcell Nails MD on 06/20/2011 10:22:06 AM

## 2011-06-26 NOTE — Consult Note (Signed)
NAMEGERLINE, Crystal [name]                ACCOUNT NO.:  [id number]  MEDICAL RECORD NO.:  [id number]           PATIENT TYPE:  I  LOCATION:  A302                          FACILITY:  [facility]  PHYSICIAN:  Lionel December, M.D.    DATE OF BIRTH:  May 16, 1941  DATE OF CONSULTATION:  05/28/2011 DATE OF DISCHARGE:                                CONSULTATION   REASON FOR CONSULTATION:  Ischemic colitis.  HISTORY OF PRESENT ILLNESS:  Crystal [name] is a 70 year old Caucasian female, who is admitted to [facility] via emergency room by Dr. Fransico Him, who is covering for Dr. [name].  She was in usual state of health until about 3 p.m. yesterday when she noted severe abdominal cramps.  She went to the bathroom, but did not have a bowel movement.  She then developed nausea and vomiting and began to have bowel movements.  Initially she had normal stool and then loose stools and then she began to pass blood. She had multiple bowel movements during the night.  She states she was up all night.  She came to emergency room about 7 a.m.  She was evaluated.  She had a leukocytosis with normal H and H.  She had abdominopelvic CT, which revealed thickening to descending colon, changes felt to be consistent with ischemic colitis.  She had similar changes on CT of September 2008 when she was admitted for similar condition.  The patient states that she is feeling better.  Her abdominal soreness is decreased.  She is hungry.  She was able to tolerate clear liquids. Since she has been on the floor she has had about 6 bowel movements and each time she has passed small clots in blood that is not large amount. She states she ate out yesterday and had lunch consisting of fried fat meat.  She states that her sister-in-law did not get sick though.  No history of prior antibiotic use or travel out of country.  She says generally her bowels move regularly.  She does not take any OTC NSAIDs. She has had good appetite and denies any  involuntary weight loss.  HOME MEDICATIONS: 1. Tylenol Arthritis 1.3 g b.i.d. 2. Micardis HCT 80/25 daily. 3. Methylphenidate 10 mg p.o. b.i.d. 4. Bupropion 150 mg p.o. b.i.d. 5. Fluoxetine 60 mg daily. 6. Fish oil 1 g p.o. b.i.d. 7. Hydrocodone/homatropine liquid 5 mg/1.5 mg per 5 mL q.4 p.r.n. for     cough. 8. Felodipine 10 mg p.o. daily. 9. Simvastatin 10 mg p.o. daily. 10.ASA 81 mg daily. 11.Calcium carbonate 1 tablet b.i.d. 12.Loratadine 10 mg daily p.r.n. 13.Omeprazole 20 mg p.o. q.a.m.  CURRENT MEDICATIONS: 1. Cipro 400 mg IV q.12 h. 2. Benicar 40 mg p.o. daily. 3. Bupropion 150 mg p.o. b.i.d. 4. Fluoxetine 60 mg p.o. daily. 5. She is also on IV fluids. 6. Zofran 4 mg IV q.6 p.r.n.  PAST MEDICAL HISTORY:  She has been hypertensive for more than 20 years. She has had symptoms of GERD for 2-3 years.  She has osteoarthrosis primarily involving joints of her hands and knees, but lately it has been doing well.  She has  history of depression and stress disorder. She has sleep apnea and uses CPAP.  History of obesity.  She states her peak weight was 270 pounds, she is now down to less than 240.  History of ischemic colitis in September 2008 when she had colonoscopy by Dr. Jena Gauss and had a 6-mm adenoma removed from her cecum and she had typical changes of ischemic colitis involving descending colon and she also had left-sided diverticulosis.  Her prior colonoscopy was in December 2004, also by Dr. Jena Gauss, for screening, and no polyps were found.  She has had 3 benign lesions removed from her breast, one from the right side and two from the left.  She had tubal ligation in 1970 and hysterectomy about 20 years ago.  History of mild thyroid enlargement possibly goiter.  ALLERGIES:  NK.  FAMILY HISTORY:  One brother died within a year of diagnosis of cancer at age 42, no details are available.  She has a brother age 4  who has had CABG and he is also diabetic.  Sister is in  good health.  SOCIAL HISTORY:  She is widowed.  She has three children, one daughter has fibromyalgia, other daughter and son are in good health.  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  She has been retired since 1998.  She has never smoked cigarettes or drank alcohol.  PHYSICAL EXAMINATION:  VITAL SIGNS:  Admission weight 110.7 kg, she is 65 inches tall, pulse 73 per minute, blood pressure 128/72, temperature is 98.4, and respirations 18. HEENT:  Conjunctivae is pink.  Sclerae nonicteric.  Oral pharyngeal mucosa is normal. NECK:  No neck masses or thyromegaly evident.  She has multiple small skin tags, primarily in the right side of the neck. CARDIAC:  With regular rhythm.  Normal S1 and S2.  No murmur or gallop noted. LUNGS:  Clear to auscultation. ABDOMEN:  Full, bowel sounds are normal.  No bruits noted.  On palpation, soft abdomen with mild-to-moderate tenderness in left upper quadrant without guarding or rebound.  No organomegaly or masses. RECTAL:  Deferred.  No peripheral edema or clubbing noted.  She does have some prominence to second MCP joints in both hands.  LAB DATA FROM ADMISSION:  WBC 14.9, H and H is 14 and 41.2, platelet count is 223 K, segs 87%, sodium 137, potassium 3.6, chloride 97, CO2 is 28, glucose 136, BUN 13, creatinine 0.78, bilirubin is 100, AP 84, SGOT 26, SGPT 23, total protein 8 with albumin of 4.6, calcium 10.2. Urinalysis revealed 21-50 WBCS, many bacteria, and urine culture is pending.  Abdominopelvic CT reviewed, it shows thickening to most of the descending colon with stranding surrounding colonic wall, no fluid collection noted.  Both celiac trunk and SMA wide opened.  There is some calcification at take off of celiac trunk.  ASSESSMENT:  Crystal [name] is a 70 year old Caucasian female who has typical signs and symptoms and CT findings of ischemic colitis.  This is  a second episode.  Her first episode was in September 2008 and confirmed via colonoscopy.  She does not appear to have obvious trigger or etiology.  While infection needs to be ruled out going through list of medicines my only concern would be one with methylphenidate.  Therefore, if possible this medicine should be discontinued.  She appears to be quite stable and I do not feel there is any need or indication for colonoscopy.  She possibly need one for surveillance in September next year.  The patient is on IV Cipro primarily for urinary tract infection, it may have some role in her ischemic colitis by causing decontamination and reducing the risk of translocation, bacteria, etc.  I do not feel we need to add any other agent at this time.  RECOMMENDATIONS: 1. Clear liquids. 2. We will repeat her CBC and BMET in a.m.  We will also obtain stool     sample for culture and O and P when she has diarrhea or nonbloody     stool. 3. The patient will be reevaluated in a.m. 4. We appreciate the opportunity to participate in the care of this     nice lady.          ______________________________ Lionel December, M.D.     NR/MEDQ  D:  05/28/2011  T:  05/29/2011  Job:  [id number]  cc:   [name]. [name], MD Fax: [phone]  R. Roetta Sessions, MD FACP [facility] [address] [place] [address]  Electronically Signed by Lionel December M.D. on 06/26/2011 12:36:20 PM

## 2011-06-27 ENCOUNTER — Encounter: Payer: Self-pay | Admitting: Urgent Care

## 2011-06-27 ENCOUNTER — Ambulatory Visit (INDEPENDENT_AMBULATORY_CARE_PROVIDER_SITE_OTHER): Payer: PRIVATE HEALTH INSURANCE | Admitting: Urgent Care

## 2011-06-27 VITALS — BP 153/72 | HR 78 | Temp 98.6°F | Ht 65.0 in | Wt 249.8 lb

## 2011-06-27 DIAGNOSIS — K559 Vascular disorder of intestine, unspecified: Secondary | ICD-10-CM

## 2011-06-27 DIAGNOSIS — D369 Benign neoplasm, unspecified site: Secondary | ICD-10-CM

## 2011-06-27 NOTE — Assessment & Plan Note (Signed)
Next colonoscopy 08/2012.

## 2011-06-27 NOTE — Patient Instructions (Signed)
Call if any further problems or to ER

## 2011-06-27 NOTE — Progress Notes (Signed)
Cc to Dr. Fagan 

## 2011-06-27 NOTE — Progress Notes (Signed)
Referring Provider: Carmie End), MD Primary Care Physician:  Carylon Perches, MD Primary Gastroenterologist:  Dr. Jena Gauss  Chief Complaint  Patient presents with  . Follow-up    ischemic colitis    HPI:  Crystal [name] is a 70 y.o. female here for follow up for ischemic colitis.  2nd bout of ischemic colitis admitted to [facility] 6/2.  She responded to supportive measures.  Last colonoscopy as below in 2008.  CT->left-sided colitis, celiac trunk/IMA wide open.  Some calcification off take off celiac trunk.  Pt denies diarrhea, rectal bleeding or constipation,  Appetite "too good."  Denies abd pain.  Overall, feeling very well.  Doing Zumba on a regular basis. Past Medical History  Diagnosis Date  . 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 Diverticulosis   . Tubular adenoma of colon 08/2007  . HTN (hypertension)   . Depression   . GERD (gastroesophageal reflux disease)   . Chronic fatigue   . OA (osteoarthritis)   . Spinal stenosis   . Sleep apnea     cpap  . Obesity    Past Surgical History  Procedure Date  . Breast cyst excision     benign  . Tubal ligation 1970    hysterectomy   Current Outpatient Prescriptions  Medication Sig Dispense Refill  . acetaminophen (TYLENOL ARTHRITIS PAIN) 650 MG CR tablet Take 650 mg by mouth 4 (four) times daily. 2 tabs in the morning and 2 tabs at night       . ALLERGY RELIEF 10 MG tablet       . aspirin 81 MG tablet Take 81 mg by mouth daily.        Marland Kitchen buPROPion (WELLBUTRIN SR) 150 MG 12 hr tablet       . calcium carbonate (OS-CAL) 600 MG TABS Take 600 mg by mouth 2 (two) times daily with a meal.        . felodipine (PLENDIL) 10 MG 24 hr tablet       . fish oil-omega-3 fatty acids 1000 MG capsule Take 2 g by mouth daily.        Marland Kitchen FLUoxetine (PROZAC) 20 MG capsule       . hydrochlorothiazide 25 MG tablet       . MICARDIS HCT 80-25 MG per tablet       . omeprazole (PRILOSEC) 20 MG capsule         Allergies as of 06/27/2011  . (No Known Allergies)   Review of Systems: Gen: Denies any fever, chills, sweats, anorexia, fatigue, weakness, malaise, weight loss, and sleep disorder CV: Denies chest pain, angina, palpitations, syncope, orthopnea, PND, peripheral edema, and claudication. Resp: Denies dyspnea at rest, dyspnea with exercise, cough, sputum, wheezing, coughing up blood, and pleurisy. GI: Denies vomiting blood, jaundice, and fecal incontinence.   Denies dysphagia or odynophagia. Derm: Denies rash, itching, dry skin, hives, moles, warts, or unhealing ulcers.  Psych: Denies depression, anxiety, memory loss, suicidal ideation, hallucinations, paranoia, and confusion. Heme: Denies bruising, bleeding, and enlarged lymph nodes.  Physical Exam: BP 153/72  Pulse 78  Temp(Src) 98.6 F (37 C) (Temporal)  Ht 5\' 5"  (1.651 m)  Wt 249 lb 12.8 oz (113.309 kg)  BMI 41.57 kg/m2 General:   Alert,  Well-developed, obese, pleasant and cooperative in NAD Head:  Normocephalic and atraumatic. Eyes:  Sclera clear, no icterus.   Conjunctiva pink. Mouth:  No deformity or lesions, dentition normal. Neck:  Supple; no  masses or thyromegaly. Heart:  Regular rate and rhythm; no murmurs, clicks, rubs,  or gallops. Abdomen:  Obese, Soft, nontender and nondistended. No masses, hepatosplenomegaly or hernias noted. Normal bowel sounds, without guarding, and without rebound.   Msk:  Symmetrical without gross deformities. Normal posture. Pulses:  Normal pulses noted. Extremities:  Without clubbing or edema. Neurologic:  Alert and  oriented x4;  grossly normal neurologically. Skin:  Intact without significant lesions or rashes. Cervical Nodes:  No significant cervical adenopathy. Psych:  Alert and cooperative. Normal mood and affect.

## 2011-06-27 NOTE — Assessment & Plan Note (Signed)
Resolved w/ supportive measures.  No distinct trigger.  2nd bout of ischemic colitis.  No significant mesenteric ischemia.  Hopefully, she will not have any further episodes.

## 2011-07-05 NOTE — Progress Notes (Signed)
Episode of ischemic colitis? 2o to volume depletion. Pt taking HCTZ 25 mg daily PLUS 25 mg in her MICARDIS. Advise pt to stay hydrated especially when she exercises. She needs to make sure her urine is light yellow. She may want to talk with Dr. [name] about alternative BP med. Continue ASA.

## 2011-07-13 NOTE — Progress Notes (Signed)
Reminder for TCS in epic

## 2011-09-14 ENCOUNTER — Other Ambulatory Visit (HOSPITAL_COMMUNITY): Payer: Self-pay | Admitting: Internal Medicine

## 2011-09-14 DIAGNOSIS — Z139 Encounter for screening, unspecified: Secondary | ICD-10-CM

## 2011-09-19 ENCOUNTER — Ambulatory Visit (HOSPITAL_COMMUNITY)
Admission: RE | Admit: 2011-09-19 | Discharge: 2011-09-19 | Disposition: A | Discharge status: Home / Self Care | Payer: Medicare Other | Source: Ambulatory Visit | Attending: Internal Medicine | Admitting: Internal Medicine

## 2011-09-19 DIAGNOSIS — Z1231 Encounter for screening mammogram for malignant neoplasm of breast: Secondary | ICD-10-CM | POA: Insufficient documentation

## 2011-09-19 DIAGNOSIS — Z139 Encounter for screening, unspecified: Secondary | ICD-10-CM

## 2011-10-07 LAB — URINE CULTURE

## 2011-10-07 LAB — CBC
HCT: 38.2
HCT: 36.8
HCT: 39.5
Hemoglobin: 12.6
MCHC: 34.1
MCHC: 34.4
MCV: 91.2
MCV: 90.1
MCV: 91
Platelets: 183
RBC: 4.08
RBC: 4.34
RDW: 13

## 2011-10-07 LAB — BASIC METABOLIC PANEL
BUN: 6
BUN: 16
CO2: 26
Chloride: 103
Creatinine, Ser: 0.74
GFR calc Af Amer: >60
GFR calc non Af Amer: >60
Glucose, Bld: 110 — ABNORMAL HIGH
Potassium: 3.3 — ABNORMAL LOW

## 2011-10-07 LAB — GIARDIA/CRYPTOSPORIDIUM SCREEN(EIA): Giardia Screen - EIA: NEGATIVE

## 2011-10-07 LAB — DIFFERENTIAL
Basophils Absolute: 0
Basophils Relative: 0
Basophils Relative: 0
Eosinophils Absolute: 0.1
Eosinophils Absolute: 0
Eosinophils Absolute: 0.1
Eosinophils Relative: 1
Eosinophils Relative: 0
Eosinophils Relative: 1
Lymphocytes Relative: 19
Lymphs Abs: 1
Monocytes Absolute: 0.4
Monocytes Absolute: 0.6
Monocytes Relative: 5
Monocytes Relative: 7
Neutrophils Relative %: 85 — ABNORMAL HIGH

## 2011-10-07 LAB — FECAL LACTOFERRIN, QUANT

## 2011-10-07 LAB — STOOL CULTURE

## 2011-10-07 LAB — PROTIME-INR: INR: 0.9

## 2011-10-07 LAB — CLOSTRIDIUM DIFFICILE EIA

## 2011-10-07 LAB — URINALYSIS, ROUTINE W REFLEX MICROSCOPIC
Protein, ur: NEGATIVE
Urobilinogen, UA: 0.2

## 2011-10-07 LAB — URINE MICROSCOPIC-ADD ON

## 2011-11-23 ENCOUNTER — Encounter (HOSPITAL_COMMUNITY): Payer: Self-pay | Admitting: Pharmacy Technician

## 2011-11-24 ENCOUNTER — Encounter (HOSPITAL_COMMUNITY): Payer: Self-pay

## 2011-11-24 ENCOUNTER — Encounter (HOSPITAL_COMMUNITY)
Admission: RE | Admit: 2011-11-24 | Discharge: 2011-11-24 | Disposition: A | Discharge status: Home / Self Care | Payer: Medicare Other | Source: Ambulatory Visit | Attending: Ophthalmology | Admitting: Ophthalmology

## 2011-11-24 HISTORY — DX: Nausea with vomiting, unspecified: Z98.890

## 2011-11-24 HISTORY — DX: Other specified postprocedural states: R11.2

## 2011-11-24 LAB — BASIC METABOLIC PANEL
CO2: 30 mEq/L (ref 19–32)
GFR calc non Af Amer: 70 mL/min — ABNORMAL LOW (ref >90)
Glucose, Bld: 85 mg/dL (ref 70–99)
Potassium: 4.1 mEq/L (ref 3.5–5.1)
Sodium: 138 mEq/L (ref 135–145)

## 2011-11-24 LAB — CBC
Hemoglobin: 13.3 g/dL (ref 12.0–15.0)
RBC: 4.22 MIL/uL (ref 3.87–5.11)

## 2011-11-24 NOTE — Patient Instructions (Signed)
20 SINIYAH EVANGELIST  11/24/2011   Your procedure is scheduled on:  Thursday, 12/01/11  Report to [facility] at 11:00 AM.  Call this number if you have problems the morning of surgery: [phone]   Remember:   Do not eat food:After Midnight.  May have clear liquids:until Midnight .  Clear liquids include soda, tea, black coffee, apple or grape juice, broth.  Take these medicines the morning of surgery with A SIP OF WATER: prozac, micardis, wellbutrin, prilosec   Do not wear jewelry, make-up or nail polish.  Do not wear lotions, powders, or perfumes. You may wear deodorant.  Do not shave 48 hours prior to surgery.  Do not bring valuables to the hospital.  Contacts, dentures or bridgework may not be worn into surgery.  Leave suitcase in the car. After surgery it may be brought to your room.  For patients admitted to the hospital, checkout time is 11:00 AM the day of discharge.   Patients discharged the day of surgery will not be allowed to drive home.  Name and phone number of your driver: driver  Special Instructions: Use eye drops as directed.   Please read over the following fact sheets that you were given: Anesthesia Post-op Instructions   Cataract A cataract is a clouding of the lens of the eye. It is most often related to aging. A cataract is not a "film" over the surface of the eye. The lens is inside the eye and changes size of the pupil. The lens can enlarge to let more light enter the eye in dark environments and contract the size of the pupil to let in bright light. The lens is the part of the eye that helps focus light on the retina. The retina is the eye's light-sensitive layer. It is in the back of the eye that sends visual signals to the brain. In a normal eye, light passes through the lens and gets focused on the retina. To help produce a sharp image, the lens must remain clear. When a lens becomes cloudy, vision is compromised by the degree and nature of the clouding. Certain  cataracts make people more near-sighted as they develop, others increase glare, and all reduce vision to some degree or another. A cataract that is so dense that it becomes milky white and a white opacity can be seen through the pupil. When the white color is seen, it is called a "mature" or "hyper-mature cataract." Such cataracts cause total blindness in the affected eye. The cataract must be removed to prevent damage to the eye itself. Some types of cataracts can cause a secondary disease of the eye, such as certain types of glaucoma. In the early stages, better lighting and eyeglasses may lessen vision problems caused by cataracts. At a certain point, surgery may be needed to improve vision. CAUSES   Aging. However, cataracts may occur at any age, even in newborns.   Certain drugs.   Trauma to the eye.   Certain diseases (such as diabetes).   Inherited or acquired medical syndromes.  SYMPTOMS   Gradual, progressive drop in vision in the affected eye. Cataracts may develop at different rates in each eye. Cataracts may even be in just one eye with the other unaffected.   Cataracts due to trauma may develop quickly, sometimes over a matter or days or even hours. The result is severe and rapid visual loss.  DIAGNOSIS  To detect a cataract, an eye doctor examines the lens. A well developed cataract  can be diagnosed without dilating the pupil. Early cataracts and others of a specific nature are best diagnosed with an exam of the eyes with the pupils dilated by drops. TREATMENT   For an early cataract, vision may improve by using different eyeglasses or stronger lighting.   If the above measures do not help, surgery is the only effective treatment. This treatment removes the cloudy lens and replaces it with a substitute lens (Intraocular lens, or IOL). Newly developed IOL technology allows the implanted lens to improve vision both at a distance and up close. Discuss with your eye surgeon about  the possibility of still needing glasses. Also discuss how visual coordination between both eyes will be affected.  A cataract needs to be removed only when vision loss interferes with your everyday activities such as driving, reading or watching TV. You and your eye doctor can make that decision together. In most cases, waiting until you are ready to have cataract surgery will not harm your eye. If you have cataracts in both eyes, only one should be removed at a time. This allows the operated eye to heal and be out of danger from serious problems (such as infection or poor wound healing) before having the other eye undergo surgery.  Sometimes, a cataract should be removed even if it does not cause problems with your vision. For example, a cataract should be removed if it prevents examination or treatment of another eye problem. Just as you cannot see out of the affected eye well, your doctor cannot see into your eye well through a cataract. The vast majority of people who have cataract surgery have better vision afterward. CATARACT REMOVAL There are two primary ways to remove a cataract. Your doctor can explain the differences and help determine which is best for you:  Phacoemulsification (small incision cataract surgery). This involves making a small cut (incision) on the edge of the clear, dome-shaped surface that covers the front of the eye (the cornea). An injection behind the eye or eye drops are given to make this a painless procedure. The doctor then inserts a tiny probe into the eye. This device emits ultrasound waves that soften and break up the cloudy center of the lens so it can be removed by suction. Most cataract surgery is done this way. The cuts are usually so small and performed in such a manner that often no sutures are needed to keep it closed.   Extracapsular surgery. Your doctor makes a slightly longer incision on the side of the cornea. The doctor removes the hard center of the lens.  The remainder of the lens is then removed by suction. In some cases, extremely fine sutures are needed which the doctor may, or may not remove in the office after the surgery.  When an IOL is implanted, it needs no care. It becomes a permanent part of your eye and cannot be seen or felt.  Some people cannot have an IOL. They may have problems during surgery, or maybe they have another eye disease. For these people, a soft contact lens may be suggested. If an IOL or contact lens cannot be used, very powerful and thick glasses are required after surgery. Since vision is very different through such thick glasses, it is important to have your doctor discuss the impact on your vision after any cataract surgery where there is no plan to implant an IOL. The normal lens of the eye is covered by a clear capsule. Both phacoemulsification and extracapsular surgery  require that the back surface of this lens capsule be left in place. This helps support IOLs and prevents the IOL from dislocating and falling back into the deeper interior of the eye. Right after surgery, and often permanently this "posterior capsule" remains clear. In some cases however, it can become cloudy, presenting the same type of visual compromise that the original cataract did since light is again obstructed as it passes through the clear IOL. This condition is often referred to as an "after-cataract." Fortunately, after-cataracts are easily treated using a painless and very fast laser treatment that is performed without anesthesia or incisions. It is done in a matter of minutes in an outpatient environment. Visual improvement is often immediate.  HOME CARE INSTRUCTIONS   Your surgeon will discuss pre and post operative care with you prior to surgery. The majority of people are able to do almost all normal activities right away. Although, it is often advised to avoid strenuous activity for a period of time.   Postoperative drops and careful  avoidance of infection will be needed. Many surgeons suggest the use of a protective shield during the first few days after surgery.   There is a very small incidence of complication from modern cataract surgery, but it can happen. Infection that spreads to the inside of the eye (endophthalmitis) can result in total visual loss and even loss of the eye itself. In extremely rare instances, the inflammation of endophthalmitis can spread to both eyes (sympathetic ophthalmia). Appropriate post-operative care under the close observation of your surgeon is essential to a successful outcome.  SEEK IMMEDIATE MEDICAL CARE IF:   You have any sudden drop of vision in the operated eye.   You have pain in the operated eye.   You see a large number of floating dots in the field of vision in the operated eye.   You see flashing lights, or if a portion of your side vision in any direction appears black (like a curtain being drawn into your field of vision) in the operated eye.  Document Released: 12/12/2005 Document Revised: 08/24/2011 Document Reviewed: 01/28/2008 [facility] Patient Information 2012 [place], [place].

## 2011-12-01 ENCOUNTER — Encounter (HOSPITAL_COMMUNITY): Payer: Self-pay | Admitting: Anesthesiology

## 2011-12-01 ENCOUNTER — Encounter (HOSPITAL_COMMUNITY): Payer: Self-pay | Admitting: Ophthalmology

## 2011-12-01 ENCOUNTER — Ambulatory Visit (HOSPITAL_COMMUNITY)
Admission: RE | Admit: 2011-12-01 | Discharge: 2011-12-01 | Disposition: A | Discharge status: Home / Self Care | Payer: Medicare Other | Source: Ambulatory Visit | Attending: Ophthalmology | Admitting: Ophthalmology

## 2011-12-01 ENCOUNTER — Encounter (HOSPITAL_COMMUNITY)
Admission: RE | Disposition: A | Discharge status: Home / Self Care | Payer: Self-pay | Source: Ambulatory Visit | Attending: Ophthalmology

## 2011-12-01 ENCOUNTER — Ambulatory Visit (HOSPITAL_COMMUNITY): Payer: Medicare Other | Admitting: Anesthesiology

## 2011-12-01 DIAGNOSIS — I10 Essential (primary) hypertension: Secondary | ICD-10-CM | POA: Insufficient documentation

## 2011-12-01 DIAGNOSIS — Z79899 Other long term (current) drug therapy: Secondary | ICD-10-CM | POA: Insufficient documentation

## 2011-12-01 DIAGNOSIS — Z01812 Encounter for preprocedural laboratory examination: Secondary | ICD-10-CM | POA: Insufficient documentation

## 2011-12-01 DIAGNOSIS — H2510 Age-related nuclear cataract, unspecified eye: Secondary | ICD-10-CM | POA: Insufficient documentation

## 2011-12-01 HISTORY — PX: CATARACT EXTRACTION W/PHACO: SHX586

## 2011-12-01 SURGERY — PHACOEMULSIFICATION, CATARACT, WITH IOL INSERTION
Anesthesia: Monitor Anesthesia Care | Site: Eye | Laterality: Right | Wound class: Clean

## 2011-12-01 MED ORDER — PHENYLEPHRINE HCL 2.5 % OP SOLN
1.0000 [drp] | OPHTHALMIC | Status: AC
  Administered 2011-12-01 (×3): 1 [drp] via OPHTHALMIC

## 2011-12-01 MED ORDER — NEOMYCIN-POLYMYXIN-DEXAMETH 3.5-10000-0.1 OP OINT
TOPICAL_OINTMENT | OPHTHALMIC | Status: AC
  Filled 2011-12-01: qty 3.5

## 2011-12-01 MED ORDER — LACTATED RINGERS IV SOLN
INTRAVENOUS | Status: DC | PRN
  Administered 2011-12-01: 12:00:00 via INTRAVENOUS

## 2011-12-01 MED ORDER — CYCLOPENTOLATE-PHENYLEPHRINE 0.2-1 % OP SOLN
OPHTHALMIC | Status: AC
  Administered 2011-12-01: 1 [drp] via OPHTHALMIC
  Filled 2011-12-01: qty 2

## 2011-12-01 MED ORDER — TETRACAINE HCL 0.5 % OP SOLN
OPHTHALMIC | Status: AC
  Administered 2011-12-01: 1 [drp] via OPHTHALMIC
  Filled 2011-12-01: qty 2

## 2011-12-01 MED ORDER — EPINEPHRINE HCL 1 MG/ML IJ SOLN
INTRAOCULAR | Status: DC | PRN
  Administered 2011-12-01: 13:00:00

## 2011-12-01 MED ORDER — BSS IO SOLN
INTRAOCULAR | Status: DC | PRN
  Administered 2011-12-01: 15 mL via INTRAOCULAR

## 2011-12-01 MED ORDER — LIDOCAINE HCL (PF) 1 % IJ SOLN
INTRAMUSCULAR | Status: AC
  Filled 2011-12-01: qty 2

## 2011-12-01 MED ORDER — LIDOCAINE HCL 3.5 % OP GEL
1.0000 "application " | Freq: Once | OPHTHALMIC | Status: AC
  Administered 2011-12-01: 1 via OPHTHALMIC

## 2011-12-01 MED ORDER — LIDOCAINE HCL (PF) 1 % IJ SOLN
INTRAMUSCULAR | Status: DC | PRN
  Administered 2011-12-01: .4 mL

## 2011-12-01 MED ORDER — MIDAZOLAM HCL 2 MG/2ML IJ SOLN
1.0000 mg | INTRAMUSCULAR | Status: DC | PRN
  Administered 2011-12-01: 2 mg via INTRAVENOUS

## 2011-12-01 MED ORDER — MIDAZOLAM HCL 2 MG/2ML IJ SOLN
INTRAMUSCULAR | Status: AC
  Filled 2011-12-01: qty 2

## 2011-12-01 MED ORDER — NEOMYCIN-POLYMYXIN-DEXAMETH 0.1 % OP OINT
TOPICAL_OINTMENT | OPHTHALMIC | Status: DC | PRN
  Administered 2011-12-01: 1 via OPHTHALMIC

## 2011-12-01 MED ORDER — PHENYLEPHRINE HCL 2.5 % OP SOLN
OPHTHALMIC | Status: AC
  Administered 2011-12-01: 1 [drp] via OPHTHALMIC
  Filled 2011-12-01: qty 2

## 2011-12-01 MED ORDER — EPINEPHRINE HCL 1 MG/ML IJ SOLN
INTRAMUSCULAR | Status: AC
  Filled 2011-12-01: qty 1

## 2011-12-01 MED ORDER — LIDOCAINE 3.5 % OP GEL OPTIME - NO CHARGE
OPHTHALMIC | Status: DC | PRN
  Administered 2011-12-01: 1 [drp] via OPHTHALMIC

## 2011-12-01 MED ORDER — CYCLOPENTOLATE-PHENYLEPHRINE 0.2-1 % OP SOLN
1.0000 [drp] | OPHTHALMIC | Status: AC
  Administered 2011-12-01 (×3): 1 [drp] via OPHTHALMIC

## 2011-12-01 MED ORDER — PROVISC 10 MG/ML IO SOLN: INTRAOCULAR | Status: DC | PRN

## 2011-12-01 MED ORDER — SODIUM HYALURONATE 16 MG/ML IO SOLN
INTRAOCULAR | Status: DC | PRN
  Administered 2011-12-01: 0.8 mL via INTRAOCULAR

## 2011-12-01 MED ORDER — POVIDONE-IODINE 5 % OP SOLN
OPHTHALMIC | Status: DC | PRN
  Administered 2011-12-01: 1 via OPHTHALMIC

## 2011-12-01 MED ORDER — LACTATED RINGERS IV SOLN
INTRAVENOUS | Status: DC
  Administered 2011-12-01: 1000 mL via INTRAVENOUS

## 2011-12-01 MED ORDER — LIDOCAINE HCL 3.5 % OP GEL
OPHTHALMIC | Status: AC
  Administered 2011-12-01: 1 via OPHTHALMIC
  Filled 2011-12-01: qty 5

## 2011-12-01 MED ORDER — TETRACAINE HCL 0.5 % OP SOLN
1.0000 [drp] | OPHTHALMIC | Status: AC
  Administered 2011-12-01 (×3): 1 [drp] via OPHTHALMIC

## 2011-12-01 SURGICAL SUPPLY — 33 items
CAPSULAR TENSION RING-AMO (OPHTHALMIC RELATED) IMPLANT
CLOTH BEACON ORANGE TIMEOUT ST (SAFETY) ×2 IMPLANT
EYE SHIELD UNIVERSAL CLEAR (GAUZE/BANDAGES/DRESSINGS) ×2 IMPLANT
GLOVE BIO SURGEON STRL SZ 6.5 (GLOVE) IMPLANT
GLOVE BIOGEL PI IND STRL 6.5 (GLOVE) ×2 IMPLANT
GLOVE BIOGEL PI IND STRL 7.0 (GLOVE) IMPLANT
GLOVE BIOGEL PI IND STRL 7.5 (GLOVE) IMPLANT
GLOVE BIOGEL PI INDICATOR 6.5 (GLOVE) ×2
GLOVE BIOGEL PI INDICATOR 7.0 (GLOVE)
GLOVE BIOGEL PI INDICATOR 7.5 (GLOVE)
GLOVE ECLIPSE 6.5 STRL STRAW (GLOVE) IMPLANT
GLOVE ECLIPSE 7.0 STRL STRAW (GLOVE) IMPLANT
GLOVE ECLIPSE 7.5 STRL STRAW (GLOVE) IMPLANT
GLOVE EXAM NITRILE LRG STRL (GLOVE) IMPLANT
GLOVE EXAM NITRILE MD LF STRL (GLOVE) ×2 IMPLANT
GLOVE SKINSENSE NS SZ6.5 (GLOVE)
GLOVE SKINSENSE NS SZ7.0 (GLOVE)
GLOVE SKINSENSE STRL SZ6.5 (GLOVE) IMPLANT
GLOVE SKINSENSE STRL SZ7.0 (GLOVE) IMPLANT
GOWN PREVENTION PLUS XLARGE (GOWN DISPOSABLE) ×2 IMPLANT
KIT VITRECTOMY (OPHTHALMIC RELATED) IMPLANT
PAD ARMBOARD 7.5X6 YLW CONV (MISCELLANEOUS) ×2 IMPLANT
PROC W NO LENS (INTRAOCULAR LENS)
PROC W SPEC LENS (INTRAOCULAR LENS)
PROCESS W NO LENS (INTRAOCULAR LENS) IMPLANT
PROCESS W SPEC LENS (INTRAOCULAR LENS) IMPLANT
RING MALYGIN (MISCELLANEOUS) IMPLANT
SIGHTPATH CAT PROC W REG LENS (Ophthalmic Related) ×2 IMPLANT
SYR TB 1ML LL NO SAFETY (SYRINGE) ×2 IMPLANT
TAPE SURG TRANSPORE 1 IN (GAUZE/BANDAGES/DRESSINGS) ×1 IMPLANT
TAPE SURGICAL TRANSPORE 1 IN (GAUZE/BANDAGES/DRESSINGS) ×1
VISCOELASTIC ADDITIONAL (OPHTHALMIC RELATED) IMPLANT
WATER STERILE IRR 250ML POUR (IV SOLUTION) ×2 IMPLANT

## 2011-12-01 NOTE — Anesthesia Procedure Notes (Signed)
Procedure Name: MAC Date/Time: 12/01/2011 12:32 PM Performed by: Carolyne Littles, Cloy Cozzens Pre-anesthesia Checklist: Patient identified, Patient being monitored, Emergency Drugs available, Timeout performed and Suction available Oxygen Delivery Method: Nasal Cannula

## 2011-12-01 NOTE — Anesthesia Postprocedure Evaluation (Signed)
  Anesthesia Post-op Note  Patient: Crystal [name]  Procedure(s) Performed:  CATARACT EXTRACTION PHACO AND INTRAOCULAR LENS PLACEMENT (IOC) - CDE=14.87  Patient Location: PACU and Short Stay  Anesthesia Type: MAC  Level of Consciousness: awake, alert  and oriented  Airway and Oxygen Therapy: Patient Spontanous Breathing  Post-op Pain: none  Post-op Assessment: Post-op Vital signs reviewed, Patient's Cardiovascular Status Stable and Respiratory Function Stable  Post-op Vital Signs: Reviewed and stable  Complications: No apparent anesthesia complications

## 2011-12-01 NOTE — H&P (Signed)
I have reviewed the H&P, the patient was re-examined, and I have identified no interval changes in medical condition and plan of care since the history and physical of record  

## 2011-12-01 NOTE — Transfer of Care (Signed)
Immediate Anesthesia Transfer of Care Note  Patient: Crystal [name]  Procedure(s) Performed:  CATARACT EXTRACTION PHACO AND INTRAOCULAR LENS PLACEMENT (IOC) - CDE=14.87  Patient Location: Short Stay  Anesthesia Type: MAC  Level of Consciousness: awake, alert  and oriented  Airway & Oxygen Therapy: Patient Spontanous Breathing  Post-op Assessment: Report given to PACU RN and Post -op Vital signs reviewed and stable  Post vital signs: Reviewed and stable  Complications: No apparent anesthesia complications

## 2011-12-01 NOTE — Anesthesia Preprocedure Evaluation (Signed)
Anesthesia Evaluation  Patient identified by MRN, date of birth, ID band  History of Anesthesia Complications (+) PONV  Airway Mallampati: II      Dental  (+) Teeth Intact   Pulmonary sleep apnea and Continuous Positive Airway Pressure Ventilation ,    Pulmonary exam normal       Cardiovascular hypertension, Pt. on medications Regular Normal    Neuro/Psych PSYCHIATRIC DISORDERS Depression    GI/Hepatic GERD-  Medicated and Controlled,  Endo/Other    Renal/GU      Musculoskeletal   Abdominal   Peds  Hematology   Anesthesia Other Findings   Reproductive/Obstetrics                           Anesthesia Physical Anesthesia Plan  ASA: III  Anesthesia Plan: MAC   Post-op Pain Management:    Induction:   Airway Management Planned: Nasal Cannula  Additional Equipment:   Intra-op Plan:   Post-operative Plan:   Informed Consent: I have reviewed the patients History and Physical, chart, labs and discussed the procedure including the risks, benefits and alternatives for the proposed anesthesia with the patient or authorized representative who has indicated his/her understanding and acceptance.     Plan Discussed with:   Anesthesia Plan Comments:         Anesthesia Quick Evaluation

## 2011-12-01 NOTE — Discharge Instructions (Signed)
Crystal [name]  12/01/2011     Instructions  1. Use medications as Instructed.  Shake well before use. Wait 5 minutes between drops.  {OPHTHALMIC ANTIBIOTICS:22167} 4 times a day x 1 week.  {OPHTHALMIC ANTI-INFLAMMATORY:22168} 2 times a day x 4 weeks.  {OPHTHALMIC STEROID:22169} 4 times a day - week 1   3 times a day - Week 2, 2 times a day- Week 3, 1 time a day - Week 4.  2. Do not rub the operative eye. Do not swim underwater for 2 weeks.  3. You may remove the clear shield and resume your normal activities the day after  Surgery. Your eyes may feel more comfortable if you wear dark glasses outside.  4. Call our office at [phone] if you have sudden change in vision, extreme redness or pain. Some fluctuation in vision is normal after surgery. If you have an emergency after hours, call Dr. Alto Denver at [phone].  5. It is important that you attend all of your follow-up appointments.        Follow-up:{follow up:32580} with Gemma Payor.   Dr. Lahoma Crocker: [phone]  Dr. Lita Mains: [phone]  Dr. Alto Denver: [phone]   If you find that you cannot contact your physician, but feel that your signs and   Symptoms warrant a physician's attention, call the Emergency Room at   [phone].   Other{NA AND GUYQIHKV:42595}.

## 2011-12-01 NOTE — Brief Op Note (Signed)
Pre-Op Dx: Cataract OD Post-Op Dx: Cataract OD Surgeon: Angelyse Heslin Anesthesia: Topical with MAC Implant: Lenstec, Model Softec HD Blood Loss: None Specimen: None Complications: None 

## 2011-12-01 NOTE — Preoperative (Signed)
Beta Blockers   Reason not to administer Beta Blockers:Not Applicable 

## 2011-12-02 NOTE — Op Note (Signed)
Crystal [name], Crystal [name] NO.:  [id number]  MEDICAL RECORD NO.:  [id number]  LOCATION:  [facility]                          FACILITY:  [facility]  PHYSICIAN:  Susanne Greenhouse, MD       DATE OF BIRTH:  [birth year]  DATE OF PROCEDURE:  12/01/2011 DATE OF DISCHARGE:  12/01/2011                              OPERATIVE REPORT   PREOPERATIVE DIAGNOSIS:  Nuclear cataract, right eye.  Diagnosis code 366.16.  POSTOPERATIVE DIAGNOSIS:  Nuclear cataract, right eye.  Diagnosis code 366.16.  OPERATION PERFORMED:  Phacoemulsification with posterior chamber intraocular lens implantation, right eye.  SURGEON:  Bonne Dolores. Meliss Fleek, MD  ANESTHESIA:  General endotracheal anesthesia.  OPERATIVE SUMMARY:  In the preoperative area, dilating drops were placed into the right eye.  The patient was then brought into the operating room where she was placed under general anesthesia.  The eye was then prepped and draped.  Beginning with a 75 blade, a paracentesis port was made at the surgeon's 2 o'clock position.  The anterior chamber was then filled with a 1% nonpreserved lidocaine solution with epinephrine.  This was followed by Viscoat to deepen the chamber.  A small fornix-based peritomy was performed superiorly.  Next, a single iris hook was placed through the limbus superiorly.  A 2.4-mm keratome blade was then used to make a clear corneal incision over the iris hook.  A bent cystotome needle and Utrata forceps were used to create a continuous tear capsulotomy.  Hydrodissection was performed using balanced salt solution on a fine cannula.  The lens nucleus was then removed using phacoemulsification in a quadrant cracking technique.  The cortical material was then removed with irrigation and aspiration.  The capsular bag and anterior chamber were refilled with Provisc.  The wound was widened to approximately 3 mm and a posterior chamber intraocular lens was placed into the capsular bag without difficulty  using an [employer] lens injecting system.  A single 10-0 nylon suture was then used to close the incision as well as stromal hydration.  The Provisc was removed from the anterior chamber and capsular bag with irrigation and aspiration.  At this point, the wounds were tested for leak, which were negative.  The anterior chamber remained deep and stable.  The patient tolerated the procedure well.  There were no operative complications, and she awoke from general anesthesia without problem.  No surgical specimens.  Prosthetic device used is a Lenstec posterior chamber lens, model Softec HD, power of 28.25, serial number is [id number].          ______________________________ Susanne Greenhouse, MD     KEH/MEDQ  D:  12/01/2011  T:  12/02/2011  Job:  [id number]

## 2011-12-08 ENCOUNTER — Encounter (HOSPITAL_COMMUNITY): Payer: Self-pay | Admitting: Ophthalmology

## 2012-01-31 ENCOUNTER — Encounter (HOSPITAL_COMMUNITY): Payer: Self-pay | Admitting: Pharmacy Technician

## 2012-02-02 NOTE — Patient Instructions (Addendum)
20 Crystal [name]  02/02/2012   Your procedure is scheduled on:  02/06/2012  Report to [facility] at  730  AM.  Call this number if you have problems the morning of surgery: [phone]   Remember:   Do not eat food:After Midnight.  May have clear liquids:until Midnight .  Clear liquids include soda, tea, black coffee, apple or grape juice, broth.  Take these medicines the morning of surgery with A SIP OF WATER:  Wellbutrin,felodipine,prozac,claritin,hyzaar,prilosec   Do not wear jewelry, make-up or nail polish.  Do not wear lotions, powders, or perfumes. You may wear deodorant.  Do not shave 48 hours prior to surgery.  Do not bring valuables to the hospital.  Contacts, dentures or bridgework may not be worn into surgery.  Leave suitcase in the car. After surgery it may be brought to your room.  For patients admitted to the hospital, checkout time is 11:00 AM the day of discharge.   Patients discharged the day of surgery will not be allowed to drive home.  Name and phone number of your driver: family  Special Instructions: N/A   Please read over the following fact sheets that you were given: Pain Booklet, Surgical Site Infection Prevention, Anesthesia Post-op Instructions and Care and Recovery After Surgery Cataract A cataract is a clouding of the lens of the eye. It is most often related to aging. A cataract is not a "film" over the surface of the eye. The lens is inside the eye and changes size of the pupil. The lens can enlarge to let more light enter the eye in dark environments and contract the size of the pupil to let in bright light. The lens is the part of the eye that helps focus light on the retina. The retina is the eye's light-sensitive layer. It is in the back of the eye that sends visual signals to the brain. In a normal eye, light passes through the lens and gets focused on the retina. To help produce a sharp image, the lens must remain clear. When a lens becomes cloudy, vision  is compromised by the degree and nature of the clouding. Certain cataracts make people more near-sighted as they develop, others increase glare, and all reduce vision to some degree or another. A cataract that is so dense that it becomes milky white and a white opacity can be seen through the pupil. When the white color is seen, it is called a "mature" or "hyper-mature cataract." Such cataracts cause total blindness in the affected eye. The cataract must be removed to prevent damage to the eye itself. Some types of cataracts can cause a secondary disease of the eye, such as certain types of glaucoma. In the early stages, better lighting and eyeglasses may lessen vision problems caused by cataracts. At a certain point, surgery may be needed to improve vision. CAUSES   Aging. However, cataracts may occur at any age, even in newborns.   Certain drugs.   Trauma to the eye.   Certain diseases (such as diabetes).   Inherited or acquired medical syndromes.  SYMPTOMS   Gradual, progressive drop in vision in the affected eye. Cataracts may develop at different rates in each eye. Cataracts may even be in just one eye with the other unaffected.   Cataracts due to trauma may develop quickly, sometimes over a matter or days or even hours. The result is severe and rapid visual loss.  DIAGNOSIS  To detect a cataract, an eye doctor examines the  lens. A well developed cataract can be diagnosed without dilating the pupil. Early cataracts and others of a specific nature are best diagnosed with an exam of the eyes with the pupils dilated by drops. TREATMENT   For an early cataract, vision may improve by using different eyeglasses or stronger lighting.   If the above measures do not help, surgery is the only effective treatment. This treatment removes the cloudy lens and replaces it with a substitute lens (Intraocular lens, or IOL). Newly developed IOL technology allows the implanted lens to improve vision both  at a distance and up close. Discuss with your eye surgeon about the possibility of still needing glasses. Also discuss how visual coordination between both eyes will be affected.  A cataract needs to be removed only when vision loss interferes with your everyday activities such as driving, reading or watching TV. You and your eye doctor can make that decision together. In most cases, waiting until you are ready to have cataract surgery will not harm your eye. If you have cataracts in both eyes, only one should be removed at a time. This allows the operated eye to heal and be out of danger from serious problems (such as infection or poor wound healing) before having the other eye undergo surgery.  Sometimes, a cataract should be removed even if it does not cause problems with your vision. For example, a cataract should be removed if it prevents examination or treatment of another eye problem. Just as you cannot see out of the affected eye well, your doctor cannot see into your eye well through a cataract. The vast majority of people who have cataract surgery have better vision afterward. CATARACT REMOVAL There are two primary ways to remove a cataract. Your doctor can explain the differences and help determine which is best for you:  Phacoemulsification (small incision cataract surgery). This involves making a small cut (incision) on the edge of the clear, dome-shaped surface that covers the front of the eye (the cornea). An injection behind the eye or eye drops are given to make this a painless procedure. The doctor then inserts a tiny probe into the eye. This device emits ultrasound waves that soften and break up the cloudy center of the lens so it can be removed by suction. Most cataract surgery is done this way. The cuts are usually so small and performed in such a manner that often no sutures are needed to keep it closed.   Extracapsular surgery. Your doctor makes a slightly longer incision on the side  of the cornea. The doctor removes the hard center of the lens. The remainder of the lens is then removed by suction. In some cases, extremely fine sutures are needed which the doctor may, or may not remove in the office after the surgery.  When an IOL is implanted, it needs no care. It becomes a permanent part of your eye and cannot be seen or felt.  Some people cannot have an IOL. They may have problems during surgery, or maybe they have another eye disease. For these people, a soft contact lens may be suggested. If an IOL or contact lens cannot be used, very powerful and thick glasses are required after surgery. Since vision is very different through such thick glasses, it is important to have your doctor discuss the impact on your vision after any cataract surgery where there is no plan to implant an IOL. The normal lens of the eye is covered by a clear capsule.  Both phacoemulsification and extracapsular surgery require that the back surface of this lens capsule be left in place. This helps support IOLs and prevents the IOL from dislocating and falling back into the deeper interior of the eye. Right after surgery, and often permanently this "posterior capsule" remains clear. In some cases however, it can become cloudy, presenting the same type of visual compromise that the original cataract did since light is again obstructed as it passes through the clear IOL. This condition is often referred to as an "after-cataract." Fortunately, after-cataracts are easily treated using a painless and very fast laser treatment that is performed without anesthesia or incisions. It is done in a matter of minutes in an outpatient environment. Visual improvement is often immediate.  HOME CARE INSTRUCTIONS   Your surgeon will discuss pre and post operative care with you prior to surgery. The majority of people are able to do almost all normal activities right away. Although, it is often advised to avoid strenuous activity for  a period of time.   Postoperative drops and careful avoidance of infection will be needed. Many surgeons suggest the use of a protective shield during the first few days after surgery.   There is a very small incidence of complication from modern cataract surgery, but it can happen. Infection that spreads to the inside of the eye (endophthalmitis) can result in total visual loss and even loss of the eye itself. In extremely rare instances, the inflammation of endophthalmitis can spread to both eyes (sympathetic ophthalmia). Appropriate post-operative care under the close observation of your surgeon is essential to a successful outcome.  SEEK IMMEDIATE MEDICAL CARE IF:   You have any sudden drop of vision in the operated eye.   You have pain in the operated eye.   You see a large number of floating dots in the field of vision in the operated eye.   You see flashing lights, or if a portion of your side vision in any direction appears black (like a curtain being drawn into your field of vision) in the operated eye.  Document Released: 12/12/2005 Document Revised: 08/24/2011 Document Reviewed: 01/28/2008 [facility] Patient Information 2012 [place], [place].PATIENT INSTRUCTIONS POST-ANESTHESIA  IMMEDIATELY FOLLOWING SURGERY:  Do not drive or operate machinery for the first twenty four hours after surgery.  Do not make any important decisions for twenty four hours after surgery or while taking narcotic pain medications or sedatives.  If you develop intractable nausea and vomiting or a severe headache please notify your doctor immediately.  FOLLOW-UP:  Please make an appointment with your surgeon as instructed. You do not need to follow up with anesthesia unless specifically instructed to do so.  WOUND CARE INSTRUCTIONS (if applicable):  Keep a dry clean dressing on the anesthesia/puncture wound site if there is drainage.  Once the wound has quit draining you may leave it open to air.  Generally you  should leave the bandage intact for twenty four hours unless there is drainage.  If the epidural site drains for more than 36-48 hours please call the anesthesia department.  QUESTIONS?:  Please feel free to call your physician or the hospital operator if you have any questions, and they will be happy to assist you.     [facility] Anesthesia Department [address] [place] [phone]

## 2012-02-03 ENCOUNTER — Encounter (HOSPITAL_COMMUNITY)
Admission: RE | Admit: 2012-02-03 | Discharge: 2012-02-03 | Disposition: A | Discharge status: Home / Self Care | Payer: Medicare Other | Source: Ambulatory Visit | Attending: Ophthalmology | Admitting: Ophthalmology

## 2012-02-03 ENCOUNTER — Encounter (HOSPITAL_COMMUNITY): Payer: Self-pay

## 2012-02-03 LAB — HEMOGLOBIN AND HEMATOCRIT, BLOOD: Hemoglobin: 13.1 g/dL (ref 12.0–15.0)

## 2012-02-03 LAB — BASIC METABOLIC PANEL
BUN: 18 mg/dL (ref 6–23)
Calcium: 9.7 mg/dL (ref 8.4–10.5)
GFR calc Af Amer: >90 mL/min (ref >90)
GFR calc non Af Amer: 82 mL/min — ABNORMAL LOW (ref >90)
Glucose, Bld: 97 mg/dL (ref 70–99)
Potassium: 4.2 mEq/L (ref 3.5–5.1)
Sodium: 141 mEq/L (ref 135–145)

## 2012-02-03 MED ORDER — TETRACAINE HCL 0.5 % OP SOLN
OPHTHALMIC | Status: AC
  Filled 2012-02-03: qty 2

## 2012-02-03 MED ORDER — CYCLOPENTOLATE-PHENYLEPHRINE 0.2-1 % OP SOLN
OPHTHALMIC | Status: AC
  Filled 2012-02-03: qty 2

## 2012-02-03 MED ORDER — LIDOCAINE HCL 3.5 % OP GEL
OPHTHALMIC | Status: AC
  Filled 2012-02-03: qty 5

## 2012-02-03 MED ORDER — NEOMYCIN-POLYMYXIN-DEXAMETH 3.5-10000-0.1 OP OINT
TOPICAL_OINTMENT | OPHTHALMIC | Status: AC
  Filled 2012-02-03: qty 3.5

## 2012-02-06 ENCOUNTER — Encounter (HOSPITAL_COMMUNITY)
Admission: RE | Disposition: A | Discharge status: Home / Self Care | Payer: Self-pay | Source: Ambulatory Visit | Attending: Ophthalmology

## 2012-02-06 ENCOUNTER — Ambulatory Visit (HOSPITAL_COMMUNITY): Payer: Medicare Other | Admitting: Anesthesiology

## 2012-02-06 ENCOUNTER — Ambulatory Visit (HOSPITAL_COMMUNITY)
Admission: RE | Admit: 2012-02-06 | Discharge: 2012-02-06 | Disposition: A | Discharge status: Home / Self Care | Payer: Medicare Other | Source: Ambulatory Visit | Attending: Ophthalmology | Admitting: Ophthalmology

## 2012-02-06 ENCOUNTER — Encounter (HOSPITAL_COMMUNITY): Payer: Self-pay | Admitting: Anesthesiology

## 2012-02-06 ENCOUNTER — Encounter (HOSPITAL_COMMUNITY): Payer: Self-pay | Admitting: Ophthalmology

## 2012-02-06 DIAGNOSIS — H2510 Age-related nuclear cataract, unspecified eye: Secondary | ICD-10-CM | POA: Insufficient documentation

## 2012-02-06 DIAGNOSIS — G4733 Obstructive sleep apnea (adult) (pediatric): Secondary | ICD-10-CM | POA: Insufficient documentation

## 2012-02-06 DIAGNOSIS — Z79899 Other long term (current) drug therapy: Secondary | ICD-10-CM | POA: Insufficient documentation

## 2012-02-06 HISTORY — PX: CATARACT EXTRACTION W/PHACO: SHX586

## 2012-02-06 SURGERY — PHACOEMULSIFICATION, CATARACT, WITH IOL INSERTION
Anesthesia: Monitor Anesthesia Care | Site: Eye | Laterality: Left | Wound class: Clean

## 2012-02-06 SURGERY — Surgical Case
Anesthesia: *Unknown

## 2012-02-06 MED ORDER — LIDOCAINE HCL 3.5 % OP GEL
1.0000 "application " | Freq: Once | OPHTHALMIC | Status: AC
  Administered 2012-02-06: 1 via OPHTHALMIC

## 2012-02-06 MED ORDER — PHENYLEPHRINE HCL 2.5 % OP SOLN
1.0000 [drp] | OPHTHALMIC | Status: AC
  Administered 2012-02-06 (×3): 1 [drp] via OPHTHALMIC

## 2012-02-06 MED ORDER — LIDOCAINE HCL 3.5 % OP GEL
OPHTHALMIC | Status: AC
  Filled 2012-02-06: qty 5

## 2012-02-06 MED ORDER — MIDAZOLAM HCL 2 MG/2ML IJ SOLN
INTRAMUSCULAR | Status: AC
  Filled 2012-02-06: qty 2

## 2012-02-06 MED ORDER — LACTATED RINGERS IV SOLN
INTRAVENOUS | Status: DC
  Administered 2012-02-06: 09:00:00 via INTRAVENOUS

## 2012-02-06 MED ORDER — TETRACAINE HCL 0.5 % OP SOLN
1.0000 [drp] | OPHTHALMIC | Status: AC
  Administered 2012-02-06 (×3): 1 [drp] via OPHTHALMIC

## 2012-02-06 MED ORDER — EPINEPHRINE HCL 1 MG/ML IJ SOLN
INTRAMUSCULAR | Status: AC
  Filled 2012-02-06: qty 1

## 2012-02-06 MED ORDER — FENTANYL CITRATE 0.05 MG/ML IJ SOLN: 25.0000 ug | INTRAMUSCULAR | Status: DC | PRN

## 2012-02-06 MED ORDER — PROVISC 10 MG/ML IO SOLN
INTRAOCULAR | Status: DC | PRN
  Administered 2012-02-06: 8.5 mg via INTRAOCULAR

## 2012-02-06 MED ORDER — PHENYLEPHRINE HCL 2.5 % OP SOLN
OPHTHALMIC | Status: AC
  Administered 2012-02-06: 1 [drp] via OPHTHALMIC
  Filled 2012-02-06: qty 2

## 2012-02-06 MED ORDER — POVIDONE-IODINE 5 % OP SOLN
OPHTHALMIC | Status: DC | PRN
  Administered 2012-02-06: 1 via OPHTHALMIC

## 2012-02-06 MED ORDER — LIDOCAINE 3.5 % OP GEL OPTIME - NO CHARGE
OPHTHALMIC | Status: DC | PRN
  Administered 2012-02-06: 2 [drp] via OPHTHALMIC

## 2012-02-06 MED ORDER — LIDOCAINE HCL (PF) 1 % IJ SOLN
INTRAMUSCULAR | Status: DC | PRN
  Administered 2012-02-06: .5 mL

## 2012-02-06 MED ORDER — ONDANSETRON HCL 4 MG/2ML IJ SOLN: 4.0000 mg | Freq: Once | INTRAMUSCULAR | Status: DC | PRN

## 2012-02-06 MED ORDER — CYCLOPENTOLATE-PHENYLEPHRINE 0.2-1 % OP SOLN
1.0000 [drp] | OPHTHALMIC | Status: AC
  Administered 2012-02-06 (×3): 1 [drp] via OPHTHALMIC

## 2012-02-06 MED ORDER — MIDAZOLAM HCL 2 MG/2ML IJ SOLN
1.0000 mg | INTRAMUSCULAR | Status: DC | PRN
  Administered 2012-02-06: 2 mg via INTRAVENOUS

## 2012-02-06 MED ORDER — BSS IO SOLN
INTRAOCULAR | Status: DC | PRN
  Administered 2012-02-06: 15 mL via INTRAOCULAR

## 2012-02-06 MED ORDER — EPINEPHRINE HCL 1 MG/ML IJ SOLN
INTRAOCULAR | Status: DC | PRN
  Administered 2012-02-06: 09:00:00

## 2012-02-06 MED ORDER — NEOMYCIN-POLYMYXIN-DEXAMETH 0.1 % OP OINT
TOPICAL_OINTMENT | OPHTHALMIC | Status: DC | PRN
  Administered 2012-02-06: 1 via OPHTHALMIC

## 2012-02-06 SURGICAL SUPPLY — 30 items
CAPSULAR TENSION RING-AMO (OPHTHALMIC RELATED) IMPLANT
CLOTH BEACON ORANGE TIMEOUT ST (SAFETY) ×2 IMPLANT
EYE SHIELD UNIVERSAL CLEAR (GAUZE/BANDAGES/DRESSINGS) ×2 IMPLANT
GLOVE BIO SURGEON STRL SZ 6.5 (GLOVE) IMPLANT
GLOVE BIOGEL PI IND STRL 6.5 (GLOVE) ×1 IMPLANT
GLOVE BIOGEL PI IND STRL 7.0 (GLOVE) IMPLANT
GLOVE BIOGEL PI IND STRL 7.5 (GLOVE) IMPLANT
GLOVE BIOGEL PI INDICATOR 6.5 (GLOVE) ×1
GLOVE BIOGEL PI INDICATOR 7.0 (GLOVE)
GLOVE BIOGEL PI INDICATOR 7.5 (GLOVE)
GLOVE ECLIPSE 6.5 STRL STRAW (GLOVE) IMPLANT
GLOVE ECLIPSE 7.0 STRL STRAW (GLOVE) IMPLANT
GLOVE ECLIPSE 7.5 STRL STRAW (GLOVE) IMPLANT
GLOVE EXAM NITRILE LRG STRL (GLOVE) IMPLANT
GLOVE EXAM NITRILE MD LF STRL (GLOVE) ×2 IMPLANT
GLOVE SKINSENSE NS SZ6.5 (GLOVE)
GLOVE SKINSENSE NS SZ7.0 (GLOVE)
GLOVE SKINSENSE STRL SZ6.5 (GLOVE) IMPLANT
GLOVE SKINSENSE STRL SZ7.0 (GLOVE) IMPLANT
KIT VITRECTOMY (OPHTHALMIC RELATED) IMPLANT
PAD ARMBOARD 7.5X6 YLW CONV (MISCELLANEOUS) ×2 IMPLANT
PROC W NO LENS (INTRAOCULAR LENS)
PROC W SPEC LENS (INTRAOCULAR LENS)
PROCESS W NO LENS (INTRAOCULAR LENS) IMPLANT
PROCESS W SPEC LENS (INTRAOCULAR LENS) IMPLANT
RING MALYGIN (MISCELLANEOUS) IMPLANT
SIGHTPATH CAT PROC W REG LENS (Ophthalmic Related) ×2 IMPLANT
SYR TB 1ML LL NO SAFETY (SYRINGE) ×2 IMPLANT
VISCOELASTIC ADDITIONAL (OPHTHALMIC RELATED) IMPLANT
WATER STERILE IRR 250ML POUR (IV SOLUTION) ×2 IMPLANT

## 2012-02-06 NOTE — Anesthesia Preprocedure Evaluation (Signed)
Anesthesia Evaluation  Patient identified by MRN, date of birth, ID band Patient awake    History of Anesthesia Complications (+) PONV  Airway Mallampati: II      Dental  (+) Teeth Intact   Pulmonary sleep apnea and Continuous Positive Airway Pressure Ventilation ,    Pulmonary exam normal       Cardiovascular hypertension, Pt. on medications Regular Normal    Neuro/Psych PSYCHIATRIC DISORDERS Depression    GI/Hepatic GERD-  Medicated and Controlled,  Endo/Other    Renal/GU      Musculoskeletal   Abdominal   Peds  Hematology   Anesthesia Other Findings   Reproductive/Obstetrics                           Anesthesia Physical Anesthesia Plan  ASA: III  Anesthesia Plan: MAC   Post-op Pain Management:    Induction: Intravenous  Airway Management Planned: Nasal Cannula  Additional Equipment:   Intra-op Plan:   Post-operative Plan:   Informed Consent: I have reviewed the patients History and Physical, chart, labs and discussed the procedure including the risks, benefits and alternatives for the proposed anesthesia with the patient or authorized representative who has indicated his/her understanding and acceptance.     Plan Discussed with:   Anesthesia Plan Comments:         Anesthesia Quick Evaluation

## 2012-02-06 NOTE — Anesthesia Postprocedure Evaluation (Signed)
  Anesthesia Post-op Note  Patient: Crystal [name]  Procedure(s) Performed:  CATARACT EXTRACTION PHACO AND INTRAOCULAR LENS PLACEMENT (IOC) - CDE 13.00  Patient Location: PACU and Short Stay  Anesthesia Type: MAC  Level of Consciousness: awake, alert , oriented and patient cooperative  Airway and Oxygen Therapy: Patient Spontanous Breathing  Post-op Pain: none  Post-op Assessment: Post-op Vital signs reviewed, Patient's Cardiovascular Status Stable, Respiratory Function Stable, Patent Airway and No signs of Nausea or vomiting  Post-op Vital Signs: Reviewed and stable  Complications: No apparent anesthesia complications

## 2012-02-06 NOTE — Transfer of Care (Signed)
Immediate Anesthesia Transfer of Care Note  Patient: Crystal [name]  Procedure(s) Performed:  CATARACT EXTRACTION PHACO AND INTRAOCULAR LENS PLACEMENT (IOC) - CDE 13.00  Patient Location: PACU and Short Stay  Anesthesia Type: MAC  Level of Consciousness: awake, alert , oriented and patient cooperative  Airway & Oxygen Therapy: Patient Spontanous Breathing  Post-op Assessment: Report given to PACU RN, Post -op Vital signs reviewed and stable and Patient moving all extremities  Post vital signs: Reviewed and stable  Complications: No apparent anesthesia complications

## 2012-02-06 NOTE — Brief Op Note (Signed)
Pre-Op Dx: Cataract OS Post-Op Dx: Cataract OS Surgeon: Leotha Westermeyer Anesthesia: Topical with MAC Implant: Lenstec, Model Softec HD Specimen: None Complications: None 

## 2012-02-06 NOTE — H&P (Signed)
I have reviewed the H&P, the patient was re-examined, and I have identified no interval changes in medical condition and plan of care since the history and physical of record  

## 2012-02-07 NOTE — Op Note (Signed)
[name], [name]                ACCOUNT NO.:  [id number]  MEDICAL RECORD NO.:  [id number]  LOCATION:  [facility]                          FACILITY:  [facility]  PHYSICIAN:  Susanne Greenhouse, MD       DATE OF BIRTH:  [birth year]  DATE OF PROCEDURE:  02/06/2012 DATE OF DISCHARGE:  02/06/2012                              OPERATIVE REPORT   PREOPERATIVE DIAGNOSIS:  Nuclear cataract, left eye, diagnosis code 366.16.  POSTOPERATIVE DIAGNOSIS:  Nuclear cataract, left eye, diagnosis code 366.16.  OPERATION PERFORMED:  Phacoemulsification with posterior chamber intraocular lens implantation, left eye.  SURGEON:  Susanne Greenhouse, MD  ANESTHESIA:  General endotracheal anesthesia.  OPERATIVE SUMMARY:  In the preoperative area, dilating drops were placed into the left eye.  The patient was then brought into the operating room where she was placed under general anesthesia.  The eye was then prepped and draped.  Beginning with a 75 blade, a paracentesis port was made at the surgeon's 2 o'clock position.  The anterior chamber was then filled with a 1% nonpreserved lidocaine solution with epinephrine.  This was followed by Viscoat to deepen the chamber.  A small fornix-based peritomy was performed superiorly.  Next, a single iris hook was placed through the limbus superiorly.  A 2.4-mm keratome blade was then used to make a clear corneal incision over the iris hook.  A bent cystotome needle and Utrata forceps were used to create a continuous tear capsulotomy.  Hydrodissection was performed using balanced salt solution on a fine cannula.  The lens nucleus was then removed using phacoemulsification in a quadrant cracking technique.  The cortical material was then removed with irrigation and aspiration.  The capsular bag and anterior chamber were refilled with Provisc.  The wound was widened to approximately 3 mm and a posterior chamber intraocular lens was placed into the capsular bag without difficulty using  an [employer] lens injecting system.  A single 10-0 nylon suture was then used to close the incision as well as stromal hydration.  The Provisc was removed from the anterior chamber and capsular bag with irrigation and aspiration.  At this point, the wounds were tested for leak, which were negative.  The anterior chamber remained deep and stable.  The patient tolerated the procedure well.  There were no operative complications, and she awoke from general anesthesia without problem.  No surgical specimens.  Prosthetic device used is a Lenstec posterior chamber lens, model Softec HD, power of 20.25, serial number is [id number].          ______________________________ Susanne Greenhouse, MD     KEH/MEDQ  D:  02/06/2012  T:  02/07/2012  Job:  [id number]

## 2012-02-08 ENCOUNTER — Encounter (HOSPITAL_COMMUNITY): Payer: Self-pay | Admitting: Ophthalmology

## 2012-09-04 ENCOUNTER — Encounter: Payer: Self-pay | Admitting: *Deleted

## 2012-09-27 ENCOUNTER — Other Ambulatory Visit (HOSPITAL_COMMUNITY): Payer: Self-pay | Admitting: Internal Medicine

## 2012-09-27 DIAGNOSIS — Z139 Encounter for screening, unspecified: Secondary | ICD-10-CM

## 2012-10-02 ENCOUNTER — Ambulatory Visit (HOSPITAL_COMMUNITY): Payer: Medicare Other

## 2012-10-03 ENCOUNTER — Other Ambulatory Visit: Payer: Self-pay | Admitting: Internal Medicine

## 2012-10-03 ENCOUNTER — Encounter: Payer: Self-pay | Admitting: Gastroenterology

## 2012-10-03 ENCOUNTER — Ambulatory Visit (INDEPENDENT_AMBULATORY_CARE_PROVIDER_SITE_OTHER): Payer: Medicare Other | Admitting: Gastroenterology

## 2012-10-03 VITALS — BP 129/63 | HR 71 | Temp 97.6°F | Ht 64.0 in | Wt 244.0 lb

## 2012-10-03 DIAGNOSIS — D369 Benign neoplasm, unspecified site: Secondary | ICD-10-CM

## 2012-10-03 MED ORDER — PEG-KCL-NACL-NASULF-NA ASC-C 100 G PO SOLR: 1.0000 | ORAL | Status: DC

## 2012-10-03 NOTE — Progress Notes (Signed)
Referring Provider: Carylon Perches, MD Primary Care Physician:  Carylon Perches, MD Primary Gastroenterologist: Dr. Jena Gauss   Chief Complaint  Patient presents with  . Colonoscopy    HPI:   71 year old female with hx of ischemic colitis, last TCS in Sept 2008 with tubular adenoma. Needs to schedule surveillance. Intermittent constipation but "nothing drastic". No diarrhea. No abdominal pain. No blood in stool. Appetite good. Trying to cut back, down 5 lbs from July 2012. Going to line dancing twice a week. Goes Tues and Thurs night. No upper GI symptoms.   Past Medical History  Diagnosis Date  . 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 Diverticulosis   . Tubular adenoma of colon 08/2007  . HTN (hypertension)   . Depression   . GERD (gastroesophageal reflux disease)   . Chronic fatigue   . OA (osteoarthritis)   . Spinal stenosis   . Sleep apnea     cpap  . Obesity   . PONV (postoperative nausea and vomiting)     Past Surgical History  Procedure Date  . Tubal ligation 1970    hysterectomy  . Abdominal hysterectomy     partial  . Breast cyst excision     benign, left x2, right x1  . Cataract extraction w/phaco 12/01/2011    Procedure: CATARACT EXTRACTION PHACO AND INTRAOCULAR LENS PLACEMENT (IOC);  Surgeon: Gemma Payor;  Location: [facility];  Service: Ophthalmology;  Laterality: Right;  CDE=14.87  . Cataract extraction w/phaco 02/06/2012    Procedure: CATARACT EXTRACTION PHACO AND INTRAOCULAR LENS PLACEMENT (IOC);  Surgeon: Gemma Payor, MD;  Location: [facility];  Service: Ophthalmology;  Laterality: Left;  CDE 13.00  . Colonoscopy Sept 2008    Last colonoscopy/bx by Dr Rourk->(08/31/07) descending colon TUBULAR ADENOMA    Current Outpatient Prescriptions  Medication Sig Dispense Refill  . acetaminophen (TYLENOL ARTHRITIS PAIN) 650 MG CR tablet Take 650 mg by mouth 2 (two) times daily. 2 tabs in the morning and 2 tabs at night      .  amLODipine (NORVASC) 10 MG tablet Take 10 mg by mouth daily.       Marland Kitchen aspirin EC 81 MG tablet Take 81 mg by mouth daily.        Marland Kitchen buPROPion (WELLBUTRIN SR) 150 MG 12 hr tablet Take 150 mg by mouth 2 (two) times daily.       . cycloSPORINE (RESTASIS) 0.05 % ophthalmic emulsion Place 1 drop into both eyes 2 (two) times daily as needed. For dry eyes      . felodipine (PLENDIL) 10 MG 24 hr tablet Take 10 mg by mouth daily.       Marland Kitchen FLUoxetine (PROZAC) 20 MG capsule Take 60 mg by mouth daily.       Marland Kitchen loratadine (CLARITIN) 10 MG tablet Take 10 mg by mouth daily.        Marland Kitchen losartan-hydrochlorothiazide (HYZAAR) 100-25 MG per tablet Take 1 tablet by mouth daily.      Marland Kitchen omeprazole (PRILOSEC) 20 MG capsule Take 20 mg by mouth daily.       . simvastatin (ZOCOR) 20 MG tablet Take 20 mg by mouth at bedtime.          Allergies as of 10/03/2012  . (No Known Allergies)    Family History  Problem Relation Age of Onset  . Cancer Brother 35  . Coronary artery disease Brother   . Diabetes Sister   . Anesthesia problems Neg Hx   .  Hypotension Neg Hx   . Pseudochol deficiency Neg Hx   . Malignant hyperthermia Neg Hx   . Colon cancer Neg Hx     History   Social History  . Marital Status: Widowed    Spouse Name: N/A    Number of Children: 2  . Years of Education: N/A   Occupational History  . Lab Tech-retired    Social History Main Topics  . Smoking status: Never Smoker   . Smokeless tobacco: None  . Alcohol Use: No  . Drug Use: No  . Sexually Active: Yes    Birth Control/ Protection: Surgical   Other Topics Concern  . None   Social History Narrative  . None    Review of Systems: Gen: Denies fever, chills, anorexia. Denies fatigue, weakness, weight loss.  CV: Denies chest pain, palpitations, syncope, peripheral edema, and claudication. Resp: Denies dyspnea at rest, cough, wheezing, coughing up blood, and pleurisy. GI: Denies vomiting blood, jaundice, and fecal incontinence.   Denies  dysphagia or odynophagia. Derm: Denies rash, itching, dry skin Psych: Denies depression, anxiety, memory loss, confusion. No homicidal or suicidal ideation.  Heme: Denies bruising, bleeding, and enlarged lymph nodes.  Physical Exam: BP 129/63  Pulse 71  Temp 97.6 F (36.4 C) (Temporal)  Ht 5\' 4"  (1.626 m)  Wt 244 lb (110.678 kg)  BMI 41.88 kg/m2 General:   Alert and oriented. No distress noted. Pleasant and cooperative.  Head:  Normocephalic and atraumatic. Eyes:  Conjuctiva clear without scleral icterus. Mouth:  Oral mucosa pink and moist. Good dentition. No lesions. Neck:  Supple, without mass or thyromegaly. Heart:  S1, S2 present without murmurs, rubs, or gallops. Regular rate and rhythm. Abdomen:  +BS, soft, non-tender and non-distended. No rebound or guarding. No HSM or masses noted. Msk:  Symmetrical without gross deformities. Normal posture. Extremities:  Without edema. Neurologic:  Alert and  oriented x4;  grossly normal neurologically. Skin:  Intact without significant lesions or rashes. Cervical Nodes:  No significant cervical adenopathy. Psych:  Alert and cooperative. Normal mood and affect.

## 2012-10-03 NOTE — Patient Instructions (Addendum)
We have set you up for a colonoscopy with Dr. Rourk in the near future.  Further recommendations to follow once this is completed.  

## 2012-10-03 NOTE — Assessment & Plan Note (Signed)
71 year old female due for surveillance colonoscopy. Tubular adenoma in Sept 2008. Hx of ischemic colitis, last episode in June 2012. Doing well without any lower or upper GI symptoms.   Proceed with TCS with Dr. Jena Gauss in near future: the risks, benefits, and alternatives have been discussed with the patient in detail. The patient states understanding and desires to proceed.

## 2012-10-04 ENCOUNTER — Ambulatory Visit (HOSPITAL_COMMUNITY)
Admission: RE | Admit: 2012-10-04 | Discharge: 2012-10-04 | Disposition: A | Discharge status: Home / Self Care | Payer: Medicare Other | Source: Ambulatory Visit | Attending: Internal Medicine | Admitting: Internal Medicine

## 2012-10-04 DIAGNOSIS — Z1231 Encounter for screening mammogram for malignant neoplasm of breast: Secondary | ICD-10-CM | POA: Insufficient documentation

## 2012-10-04 DIAGNOSIS — Z139 Encounter for screening, unspecified: Secondary | ICD-10-CM

## 2012-10-04 NOTE — Progress Notes (Signed)
Faxed to PCP

## 2012-10-17 ENCOUNTER — Encounter (HOSPITAL_COMMUNITY): Payer: Self-pay | Admitting: Pharmacy Technician

## 2012-10-24 MED ORDER — SODIUM CHLORIDE 0.45 % IV SOLN
INTRAVENOUS | Status: DC
  Administered 2012-10-25: 10:00:00 via INTRAVENOUS

## 2012-10-25 ENCOUNTER — Encounter (HOSPITAL_COMMUNITY): Payer: Self-pay

## 2012-10-25 ENCOUNTER — Encounter (HOSPITAL_COMMUNITY)
Admission: RE | Disposition: A | Discharge status: Home / Self Care | Payer: Self-pay | Source: Ambulatory Visit | Attending: Internal Medicine

## 2012-10-25 ENCOUNTER — Ambulatory Visit (HOSPITAL_COMMUNITY)
Admission: RE | Admit: 2012-10-25 | Discharge: 2012-10-25 | Disposition: A | Discharge status: Home / Self Care | Payer: Medicare Other | Source: Ambulatory Visit | Attending: Internal Medicine | Admitting: Internal Medicine

## 2012-10-25 DIAGNOSIS — Z860100 Personal history of colon polyps, unspecified: Secondary | ICD-10-CM | POA: Insufficient documentation

## 2012-10-25 DIAGNOSIS — K648 Other hemorrhoids: Secondary | ICD-10-CM | POA: Insufficient documentation

## 2012-10-25 DIAGNOSIS — I10 Essential (primary) hypertension: Secondary | ICD-10-CM | POA: Insufficient documentation

## 2012-10-25 DIAGNOSIS — D126 Benign neoplasm of colon, unspecified: Secondary | ICD-10-CM

## 2012-10-25 DIAGNOSIS — K5730 Diverticulosis of large intestine without perforation or abscess without bleeding: Secondary | ICD-10-CM | POA: Insufficient documentation

## 2012-10-25 DIAGNOSIS — D369 Benign neoplasm, unspecified site: Secondary | ICD-10-CM

## 2012-10-25 DIAGNOSIS — Z1211 Encounter for screening for malignant neoplasm of colon: Secondary | ICD-10-CM

## 2012-10-25 HISTORY — PX: COLONOSCOPY: SHX5424

## 2012-10-25 SURGERY — COLONOSCOPY
Anesthesia: Moderate Sedation

## 2012-10-25 MED ORDER — MIDAZOLAM HCL 5 MG/5ML IJ SOLN
INTRAMUSCULAR | Status: DC | PRN
  Administered 2012-10-25: 1 mg via INTRAVENOUS
  Administered 2012-10-25 (×2): 2 mg via INTRAVENOUS

## 2012-10-25 MED ORDER — MIDAZOLAM HCL 5 MG/5ML IJ SOLN
INTRAMUSCULAR | Status: AC
  Filled 2012-10-25: qty 10

## 2012-10-25 MED ORDER — MEPERIDINE HCL 100 MG/ML IJ SOLN
INTRAMUSCULAR | Status: DC | PRN
  Administered 2012-10-25 (×2): 25 mg via INTRAVENOUS
  Administered 2012-10-25: 50 mg via INTRAVENOUS

## 2012-10-25 MED ORDER — STERILE WATER FOR IRRIGATION IR SOLN
Status: DC | PRN
  Administered 2012-10-25: 12:00:00

## 2012-10-25 MED ORDER — MEPERIDINE HCL 100 MG/ML IJ SOLN
INTRAMUSCULAR | Status: AC
  Filled 2012-10-25: qty 2

## 2012-10-25 NOTE — H&P (View-Only) (Signed)
Referring Provider: Fagan, Roy, MD Primary Care Physician:  FAGAN,ROY, MD Primary Gastroenterologist: Dr. Rourk   Chief Complaint  Patient presents with  . Colonoscopy    HPI:   71-year-old female with hx of ischemic colitis, last TCS in Sept 2008 with tubular adenoma. Needs to schedule surveillance. Intermittent constipation but "nothing drastic". No diarrhea. No abdominal pain. No blood in stool. Appetite good. Trying to cut back, down 5 lbs from July 2012. Going to line dancing twice a week. Goes Tues and Thurs night. No upper GI symptoms.   Past Medical History  Diagnosis Date  . 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 Diverticulosis   . Tubular adenoma of colon 08/2007  . HTN (hypertension)   . Depression   . GERD (gastroesophageal reflux disease)   . Chronic fatigue   . OA (osteoarthritis)   . Spinal stenosis   . Sleep apnea     cpap  . Obesity   . PONV (postoperative nausea and vomiting)     Past Surgical History  Procedure Date  . Tubal ligation 1970    hysterectomy  . Abdominal hysterectomy     partial  . Breast cyst excision     benign, left x2, right x1  . Cataract extraction w/phaco 12/01/2011    Procedure: CATARACT EXTRACTION PHACO AND INTRAOCULAR LENS PLACEMENT (IOC);  Surgeon: Kerry Hunt;  Location: [facility];  Service: Ophthalmology;  Laterality: Right;  CDE=14.87  . Cataract extraction w/phaco 02/06/2012    Procedure: CATARACT EXTRACTION PHACO AND INTRAOCULAR LENS PLACEMENT (IOC);  Surgeon: Kerry Hunt, MD;  Location: [facility];  Service: Ophthalmology;  Laterality: Left;  CDE 13.00  . Colonoscopy Sept 2008    Last colonoscopy/bx by Dr Rourk->(08/31/07) descending colon TUBULAR ADENOMA    Current Outpatient Prescriptions  Medication Sig Dispense Refill  . acetaminophen (TYLENOL ARTHRITIS PAIN) 650 MG CR tablet Take 650 mg by mouth 2 (two) times daily. 2 tabs in the morning and 2 tabs at night      .  amLODipine (NORVASC) 10 MG tablet Take 10 mg by mouth daily.       . aspirin EC 81 MG tablet Take 81 mg by mouth daily.        . buPROPion (WELLBUTRIN SR) 150 MG 12 hr tablet Take 150 mg by mouth 2 (two) times daily.       . cycloSPORINE (RESTASIS) 0.05 % ophthalmic emulsion Place 1 drop into both eyes 2 (two) times daily as needed. For dry eyes      . felodipine (PLENDIL) 10 MG 24 hr tablet Take 10 mg by mouth daily.       . FLUoxetine (PROZAC) 20 MG capsule Take 60 mg by mouth daily.       . loratadine (CLARITIN) 10 MG tablet Take 10 mg by mouth daily.        . losartan-hydrochlorothiazide (HYZAAR) 100-25 MG per tablet Take 1 tablet by mouth daily.      . omeprazole (PRILOSEC) 20 MG capsule Take 20 mg by mouth daily.       . simvastatin (ZOCOR) 20 MG tablet Take 20 mg by mouth at bedtime.          Allergies as of 10/03/2012  . (No Known Allergies)    Family History  Problem Relation Age of Onset  . Cancer Brother 55  . Coronary artery disease Brother   . Diabetes Sister   . Anesthesia problems Neg Hx   .   Hypotension Neg Hx   . Pseudochol deficiency Neg Hx   . Malignant hyperthermia Neg Hx   . Colon cancer Neg Hx     History   Social History  . Marital Status: Widowed    Spouse Name: N/A    Number of Children: 2  . Years of Education: N/A   Occupational History  . Lab Tech-retired    Social History Main Topics  . Smoking status: Never Smoker   . Smokeless tobacco: None  . Alcohol Use: No  . Drug Use: No  . Sexually Active: Yes    Birth Control/ Protection: Surgical   Other Topics Concern  . None   Social History Narrative  . None    Review of Systems: Gen: Denies fever, chills, anorexia. Denies fatigue, weakness, weight loss.  CV: Denies chest pain, palpitations, syncope, peripheral edema, and claudication. Resp: Denies dyspnea at rest, cough, wheezing, coughing up blood, and pleurisy. GI: Denies vomiting blood, jaundice, and fecal incontinence.   Denies  dysphagia or odynophagia. Derm: Denies rash, itching, dry skin Psych: Denies depression, anxiety, memory loss, confusion. No homicidal or suicidal ideation.  Heme: Denies bruising, bleeding, and enlarged lymph nodes.  Physical Exam: BP 129/63  Pulse 71  Temp 97.6 F (36.4 C) (Temporal)  Ht 5' 4" (1.626 m)  Wt 244 lb (110.678 kg)  BMI 41.88 kg/m2 General:   Alert and oriented. No distress noted. Pleasant and cooperative.  Head:  Normocephalic and atraumatic. Eyes:  Conjuctiva clear without scleral icterus. Mouth:  Oral mucosa pink and moist. Good dentition. No lesions. Neck:  Supple, without mass or thyromegaly. Heart:  S1, S2 present without murmurs, rubs, or gallops. Regular rate and rhythm. Abdomen:  +BS, soft, non-tender and non-distended. No rebound or guarding. No HSM or masses noted. Msk:  Symmetrical without gross deformities. Normal posture. Extremities:  Without edema. Neurologic:  Alert and  oriented x4;  grossly normal neurologically. Skin:  Intact without significant lesions or rashes. Cervical Nodes:  No significant cervical adenopathy. Psych:  Alert and cooperative. Normal mood and affect.  

## 2012-10-25 NOTE — Op Note (Signed)
[facility] [address] [place], [address]   COLONOSCOPY PROCEDURE REPORT  PATIENT: Crystal [name], Crystal [name]  MR#:         [id number] BIRTHDATE: June 14, 1941 , 71  yrs. old GENDER: Female ENDOSCOPIST: R.  Roetta Sessions, MD FACP FACG REFERRED BY:  Carylon Perches, M.D. PROCEDURE DATE:  10/25/2012 PROCEDURE:     Colonoscopy with multiple snare polypectomy  INDICATIONS: history of colonic adenoma  INFORMED CONSENT:  The risks, benefits, alternatives and imponderables including but not limited to bleeding, perforation as well as the possibility of a missed lesion have been reviewed.  The potential for biopsy, lesion removal, etc. have also been discussed.  Questions have been answered.  All parties agreeable. Please see the history and physical in the medical record for more information.  MEDICATIONS: Versed 5 mg IV and Demerol 100 mg IV in divided doses.  DESCRIPTION OF PROCEDURE:  After a digital rectal exam was performed, the Pentax Colonoscope [id number]  colonoscope was advanced from the anus through the rectum and colon to the area of the cecum, ileocecal valve and appendiceal orifice.  The cecum was deeply intubated.  These structures were well-seen and photographed for the record.  From the level of the cecum and ileocecal valve, the scope was slowly and cautiously withdrawn.  The mucosal surfaces were carefully surveyed utilizing scope tip deflection to facilitate fold flattening as needed.  The scope was pulled down into the rectum where a thorough examination including retroflexion was performed.    FINDINGS:  Adequate preparation. Internal hemorrhoids; otherwise, normal rectum. Densely populated left-sided diverticula; multiple colonic polyps found-one in the cecum, one in the ascending segment, one at the splenic flexure and a single polyp in the midsigmoid segment-all approximate 5 mm in dimensions.  THERAPEUTIC / DIAGNOSTIC MANEUVERS PERFORMED:  All  above-mentioned polyps were either hot or cold snared removed  COMPLICATIONS: none  CECAL WITHDRAWAL TIME:  19 minutes  IMPRESSION:  Colonic polyps-removed as described above. Colonic diverticulosis. Internal hemorrhoids.  RECOMMENDATIONS: Followup on pathology   _______________________________ eSigned:  R. Roetta Sessions, MD FACP [facility] 10/25/2012 1:02 PM   CC:    PATIENT NAME:  Crystal [name], Crystal [name] MR#: [id number]

## 2012-10-25 NOTE — Interval H&P Note (Signed)
History and Physical Interval Note:  10/25/2012 12:03 PM  Crystal [name]  has presented today for surgery, with the diagnosis of Adenomatous polyp  The various methods of treatment have been discussed with the patient and family. After consideration of risks, benefits and other options for treatment, the patient has consented to  Procedure(s) (LRB) with comments: COLONOSCOPY (N/A) - 10:15-changed to 10:30 Soledad Gerlach notified as a surgical intervention .  The patient's history has been reviewed, patient examined, no change in status, stable for surgery.  I have reviewed the patient's chart and labs.  Questions were answered to the patient's satisfaction.     Eula Listen

## 2012-10-27 ENCOUNTER — Encounter: Payer: Self-pay | Admitting: Internal Medicine

## 2012-10-29 ENCOUNTER — Encounter: Payer: Self-pay | Admitting: *Deleted

## 2012-10-31 ENCOUNTER — Emergency Department (HOSPITAL_COMMUNITY)
Admission: EM | Admit: 2012-10-31 | Discharge: 2012-10-31 | Disposition: A | Discharge status: Home / Self Care | Payer: Medicare Other | Attending: Emergency Medicine | Admitting: Emergency Medicine

## 2012-10-31 ENCOUNTER — Emergency Department (HOSPITAL_COMMUNITY): Payer: Medicare Other

## 2012-10-31 ENCOUNTER — Encounter (HOSPITAL_COMMUNITY): Payer: Self-pay | Admitting: Emergency Medicine

## 2012-10-31 DIAGNOSIS — E669 Obesity, unspecified: Secondary | ICD-10-CM | POA: Insufficient documentation

## 2012-10-31 DIAGNOSIS — F329 Major depressive disorder, single episode, unspecified: Secondary | ICD-10-CM | POA: Insufficient documentation

## 2012-10-31 DIAGNOSIS — Z79899 Other long term (current) drug therapy: Secondary | ICD-10-CM | POA: Insufficient documentation

## 2012-10-31 DIAGNOSIS — M1710 Unilateral primary osteoarthritis, unspecified knee: Secondary | ICD-10-CM

## 2012-10-31 DIAGNOSIS — Z7982 Long term (current) use of aspirin: Secondary | ICD-10-CM | POA: Insufficient documentation

## 2012-10-31 DIAGNOSIS — IMO0002 Reserved for concepts with insufficient information to code with codable children: Secondary | ICD-10-CM | POA: Insufficient documentation

## 2012-10-31 DIAGNOSIS — Z90710 Acquired absence of both cervix and uterus: Secondary | ICD-10-CM | POA: Insufficient documentation

## 2012-10-31 DIAGNOSIS — K559 Vascular disorder of intestine, unspecified: Secondary | ICD-10-CM | POA: Insufficient documentation

## 2012-10-31 DIAGNOSIS — I10 Essential (primary) hypertension: Secondary | ICD-10-CM | POA: Insufficient documentation

## 2012-10-31 DIAGNOSIS — F3289 Other specified depressive episodes: Secondary | ICD-10-CM | POA: Insufficient documentation

## 2012-10-31 DIAGNOSIS — G4730 Sleep apnea, unspecified: Secondary | ICD-10-CM | POA: Insufficient documentation

## 2012-10-31 DIAGNOSIS — K219 Gastro-esophageal reflux disease without esophagitis: Secondary | ICD-10-CM | POA: Insufficient documentation

## 2012-10-31 DIAGNOSIS — Z8719 Personal history of other diseases of the digestive system: Secondary | ICD-10-CM | POA: Insufficient documentation

## 2012-10-31 MED ORDER — HYDROCODONE-ACETAMINOPHEN 5-325 MG PO TABS: ORAL_TABLET | ORAL | Status: DC

## 2012-10-31 NOTE — ED Notes (Signed)
Pt was line dancing, made a step then knee gave away.  Knee hurts when moved certain ways.

## 2012-10-31 NOTE — ED Provider Notes (Signed)
History     CSN: [id number]  Arrival date & time 10/31/12  1035   First MD Initiated Contact with Patient 10/31/12 1312      Chief Complaint  Patient presents with  . Knee Pain    (Consider location/radiation/quality/duration/timing/severity/associated sxs/prior treatment) HPI Comments: Patient is a 71 year old female who was dancing on yesterday during exercise class when her knee" gave away".  Patient is a 71 y.o. female presenting with knee pain. The history is provided by the patient.  Knee Pain This is a new problem. The current episode started yesterday. The problem has been unchanged. Associated symptoms include arthralgias. Pertinent negatives include no abdominal pain, chest pain, coughing or neck pain. The symptoms are aggravated by walking and standing. She has tried nothing for the symptoms. The treatment provided no relief.    Past Medical History  Diagnosis Date  . 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 Diverticulosis   . Tubular adenoma of colon 08/2007  . HTN (hypertension)   . Depression   . GERD (gastroesophageal reflux disease)   . Chronic fatigue   . OA (osteoarthritis)   . Spinal stenosis   . Sleep apnea     cpap  . Obesity   . PONV (postoperative nausea and vomiting)     Past Surgical History  Procedure Date  . Tubal ligation 1970    hysterectomy  . Abdominal hysterectomy     partial  . Breast cyst excision     benign, left x2, right x1  . Cataract extraction w/phaco 12/01/2011    Procedure: CATARACT EXTRACTION PHACO AND INTRAOCULAR LENS PLACEMENT (IOC);  Surgeon: Gemma Payor;  Location: [facility];  Service: Ophthalmology;  Laterality: Right;  CDE=14.87  . Cataract extraction w/phaco 02/06/2012    Procedure: CATARACT EXTRACTION PHACO AND INTRAOCULAR LENS PLACEMENT (IOC);  Surgeon: Gemma Payor, MD;  Location: [facility];  Service: Ophthalmology;  Laterality: Left;  CDE 13.00  . Colonoscopy Sept  2008    Last colonoscopy/bx by Dr Rourk->(08/31/07) descending colon TUBULAR ADENOMA  . Colonoscopy 10/25/2012    Procedure: COLONOSCOPY;  Surgeon: Corbin Ade, MD;  Location: [facility];  Service: Endoscopy;  Laterality: N/A;  10:15-changed to 10:30 Soledad Gerlach notified    Family History  Problem Relation Age of Onset  . Cancer Brother 98  . Coronary artery disease Brother   . Diabetes Sister   . Anesthesia problems Neg Hx   . Hypotension Neg Hx   . Pseudochol deficiency Neg Hx   . Malignant hyperthermia Neg Hx   . Colon cancer Neg Hx     History  Substance Use Topics  . Smoking status: Never Smoker   . Smokeless tobacco: Not on file  . Alcohol Use: No    OB History    Grav Para Term Preterm Abortions TAB SAB Ect Mult Living                  Review of Systems  Constitutional: Negative for activity change.       All ROS Neg except as noted in HPI  HENT: Negative for nosebleeds and neck pain.   Eyes: Negative for photophobia and discharge.  Respiratory: Negative for cough, shortness of breath and wheezing.   Cardiovascular: Negative for chest pain and palpitations.  Gastrointestinal: Negative for abdominal pain and blood in stool.  Genitourinary: Negative for dysuria, frequency and hematuria.  Musculoskeletal: Positive for arthralgias. Negative for back pain.  Skin:  Negative.   Neurological: Negative for dizziness, seizures and speech difficulty.  Psychiatric/Behavioral: Negative for hallucinations and confusion.    Allergies  Review of patient's allergies indicates no known allergies.  Home Medications   Current Outpatient Rx  Name  Route  Sig  Dispense  Refill  . ACETAMINOPHEN ER 650 MG PO TBCR   Oral   Take 1,300 mg by mouth 2 (two) times daily.          Marland Kitchen AMLODIPINE BESYLATE 10 MG PO TABS   Oral   Take 10 mg by mouth daily.          . ASPIRIN EC 81 MG PO TBEC   Oral   Take 81 mg by mouth daily.           . BUPROPION HCL ER (SR) 150 MG PO  TB12   Oral   Take 150 mg by mouth 2 (two) times daily.          . CYCLOSPORINE 0.05 % OP EMUL   Both Eyes   Place 1 drop into both eyes 2 (two) times daily as needed. For dry eyes         . FELODIPINE ER 10 MG PO TB24   Oral   Take 10 mg by mouth daily.          Marland Kitchen FLUOXETINE HCL 20 MG PO CAPS   Oral   Take 60 mg by mouth daily.          Marland Kitchen LORATADINE 10 MG PO TABS   Oral   Take 10 mg by mouth daily.           Marland Kitchen LOSARTAN POTASSIUM-HCTZ 100-25 MG PO TABS   Oral   Take 1 tablet by mouth daily.         Marland Kitchen OMEPRAZOLE 20 MG PO CPDR   Oral   Take 20 mg by mouth daily.          Marland Kitchen SIMVASTATIN 20 MG PO TABS   Oral   Take 20 mg by mouth at bedtime.             BP 134/63  Pulse 69  Temp 98 F (36.7 C) (Oral)  Resp 20  Ht 5\' 4"  (1.626 m)  Wt 240 lb (108.863 kg)  BMI 41.20 kg/m2  SpO2 97%  Physical Exam  Nursing note and vitals reviewed. Constitutional: She is oriented to person, place, and time. She appears well-developed and well-nourished.  Non-toxic appearance.  HENT:  Head: Normocephalic.  Right Ear: Tympanic membrane and external ear normal.  Left Ear: Tympanic membrane and external ear normal.  Eyes: EOM and lids are normal. Pupils are equal, round, and reactive to light.  Neck: Normal range of motion. Neck supple. Carotid bruit is not present.  Cardiovascular: Normal rate, regular rhythm, normal heart sounds, intact distal pulses and normal pulses.   Pulmonary/Chest: Breath sounds normal. No respiratory distress.  Abdominal: Soft. Bowel sounds are normal. There is no tenderness. There is no guarding.  Musculoskeletal: Normal range of motion.       There is no swelling or deformity of the quadricep area. There is degenerative changes of the patella of the left knee. There is no effusion appreciated. There is no posterior mass present. There is soreness of the anterior tibial tuberosity, but this area is not hot. There is crepitus present. There is  soreness to palpation of the knee. There is good range of motion of the left hip, left ankle and  toes.  Lymphadenopathy:       Head (right side): No submandibular adenopathy present.       Head (left side): No submandibular adenopathy present.    She has no cervical adenopathy.  Neurological: She is alert and oriented to person, place, and time. She has normal strength. No cranial nerve deficit or sensory deficit.  Skin: Skin is warm and dry.  Psychiatric: She has a normal mood and affect. Her speech is normal.    ED Course  Procedures (including critical care time)  Labs Reviewed - No data to display Dg Knee Complete 4 Views Left  10/31/2012  *RADIOLOGY REPORT*  Clinical Data: Twisting leg injury.  LEFT KNEE - COMPLETE 4+ VIEW  Comparison: Report from 08/21/2003  Findings: Prominent loss of medial articular space noted with marginal spurring.  Patellofemoral spurring noted with a trace knee effusion.  Loss of patellofemoral articular space is present. There is relative preservation of articular space in the lateral compartment.  IMPRESSION: 1.  Osteoarthritis particularly effecting the medial compartment of patellofemoral joint.  2.  Trace knee effusion.   Original Report Authenticated By: Gaylyn Rong, M.D.      No diagnosis found.    MDM  I have reviewed nursing notes, vital signs, and all appropriate lab and imaging results for this patient. X-ray of the left knee reveals advance osteoarthritis with patellofemoral spurring of being present. There is a trace effusion present. There is loss of patellofemoral articular space also present. The patient was made aware of the x-ray findings. She has a knee support she would like to use. A shunt advised to use Tylenol Extra Strength every 4 hours for soreness. We'll also recommend diclofenac gel until the patient can see orthopedics. Patient advised to use a cane when walking and is especially when dancing.       Kathie Dike,  [place] 10/31/12 [phone]

## 2012-11-01 NOTE — ED Provider Notes (Signed)
Medical screening examination/treatment/procedure(s) were conducted as a shared visit with non-physician practitioner(s) and myself.  I personally evaluated the patient during the encounter  Pt well appearing, appropriate for f/u with orthopedics.  Stable for d/c   Joya Gaskins, MD 11/01/12 1304

## 2012-11-12 ENCOUNTER — Encounter: Payer: Self-pay | Admitting: Orthopedic Surgery

## 2012-11-12 ENCOUNTER — Ambulatory Visit (INDEPENDENT_AMBULATORY_CARE_PROVIDER_SITE_OTHER): Payer: Medicare Other | Admitting: Orthopedic Surgery

## 2012-11-12 VITALS — Ht 64.0 in | Wt 240.0 lb

## 2012-11-12 DIAGNOSIS — S8390XA Sprain of unspecified site of unspecified knee, initial encounter: Secondary | ICD-10-CM

## 2012-11-12 DIAGNOSIS — IMO0002 Reserved for concepts with insufficient information to code with codable children: Secondary | ICD-10-CM

## 2012-11-12 NOTE — Progress Notes (Signed)
Patient ID: Crystal [name], female   DOB: 1941-07-10, 71 y.o.   MRN: [id number] Chief Complaint  Patient presents with  . Knee Pain    Left knee pain, DOI 10-30-12.   The patient is line dancing. The knee gave way when she twisted it on October 31, 1999 1310 x-rays. They were negative for acute fracture.  She was treated with Voltaren gel and ice and rest. Symptoms came on suddenly. They were associated with throbbing 7/10. Her main pain, but the pain has since resolved.  Her review of systems is notable for joint pain, and easy bruising.  She has a medical history as follows Past Medical History  Diagnosis Date  . 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 Diverticulosis   . Tubular adenoma of colon 08/2007  . HTN (hypertension)   . Depression   . GERD (gastroesophageal reflux disease)   . Chronic fatigue   . OA (osteoarthritis)   . Spinal stenosis   . Sleep apnea     cpap  . Obesity   . PONV (postoperative nausea and vomiting)     Ht 5\' 4"  (1.626 m)  Wt 240 lb (108.863 kg)  BMI 41.20 kg/m2  The LEFT knee looks good. There is no swelling or tenderness. She has full range of motion. Normal strength. All ligaments are stable. [place] Scrape sign is negative. There is no tenderness in the joint. Neurovascular exam is normal.  X-rays again were negative. Report read negative as well. I agree with that report. 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  No residual symptoms that  Followup as needed

## 2012-11-12 NOTE — Patient Instructions (Addendum)
activities as tolerated 

## 2013-09-09 ENCOUNTER — Other Ambulatory Visit (HOSPITAL_COMMUNITY): Payer: Self-pay | Admitting: Internal Medicine

## 2013-09-09 ENCOUNTER — Ambulatory Visit (HOSPITAL_COMMUNITY)
Admission: RE | Admit: 2013-09-09 | Discharge: 2013-09-09 | Disposition: A | Discharge status: Home / Self Care | Payer: Medicare Other | Source: Ambulatory Visit | Attending: Internal Medicine | Admitting: Internal Medicine

## 2013-09-09 DIAGNOSIS — M25561 Pain in right knee: Secondary | ICD-10-CM

## 2013-09-09 DIAGNOSIS — M25569 Pain in unspecified knee: Secondary | ICD-10-CM | POA: Insufficient documentation

## 2013-11-26 ENCOUNTER — Other Ambulatory Visit (HOSPITAL_COMMUNITY): Payer: Self-pay | Admitting: Internal Medicine

## 2013-11-26 DIAGNOSIS — Z139 Encounter for screening, unspecified: Secondary | ICD-10-CM

## 2013-11-28 ENCOUNTER — Ambulatory Visit (HOSPITAL_COMMUNITY): Payer: Medicare Other

## 2013-12-02 ENCOUNTER — Ambulatory Visit (HOSPITAL_COMMUNITY)
Admission: RE | Admit: 2013-12-02 | Discharge: 2013-12-02 | Disposition: A | Discharge status: Home / Self Care | Payer: Medicare Other | Source: Ambulatory Visit | Attending: Internal Medicine | Admitting: Internal Medicine

## 2013-12-02 DIAGNOSIS — Z139 Encounter for screening, unspecified: Secondary | ICD-10-CM

## 2013-12-02 DIAGNOSIS — Z1231 Encounter for screening mammogram for malignant neoplasm of breast: Secondary | ICD-10-CM | POA: Insufficient documentation

## 2014-01-24 ENCOUNTER — Encounter (HOSPITAL_COMMUNITY): Payer: Self-pay | Admitting: Emergency Medicine

## 2014-01-24 ENCOUNTER — Emergency Department (HOSPITAL_COMMUNITY)
Admission: EM | Admit: 2014-01-24 | Discharge: 2014-01-24 | Disposition: A | Discharge status: Home / Self Care | Payer: Medicare PPO | Attending: Emergency Medicine | Admitting: Emergency Medicine

## 2014-01-24 DIAGNOSIS — Z9889 Other specified postprocedural states: Secondary | ICD-10-CM | POA: Insufficient documentation

## 2014-01-24 DIAGNOSIS — K219 Gastro-esophageal reflux disease without esophagitis: Secondary | ICD-10-CM | POA: Insufficient documentation

## 2014-01-24 DIAGNOSIS — Z860100 Personal history of colon polyps, unspecified: Secondary | ICD-10-CM | POA: Insufficient documentation

## 2014-01-24 DIAGNOSIS — Z79899 Other long term (current) drug therapy: Secondary | ICD-10-CM | POA: Insufficient documentation

## 2014-01-24 DIAGNOSIS — Z7982 Long term (current) use of aspirin: Secondary | ICD-10-CM | POA: Insufficient documentation

## 2014-01-24 DIAGNOSIS — F329 Major depressive disorder, single episode, unspecified: Secondary | ICD-10-CM | POA: Insufficient documentation

## 2014-01-24 DIAGNOSIS — I10 Essential (primary) hypertension: Secondary | ICD-10-CM | POA: Insufficient documentation

## 2014-01-24 DIAGNOSIS — R1032 Left lower quadrant pain: Secondary | ICD-10-CM

## 2014-01-24 DIAGNOSIS — K5733 Diverticulitis of large intestine without perforation or abscess with bleeding: Secondary | ICD-10-CM | POA: Insufficient documentation

## 2014-01-24 DIAGNOSIS — K5792 Diverticulitis of intestine, part unspecified, without perforation or abscess without bleeding: Secondary | ICD-10-CM

## 2014-01-24 DIAGNOSIS — E669 Obesity, unspecified: Secondary | ICD-10-CM | POA: Insufficient documentation

## 2014-01-24 DIAGNOSIS — Z90710 Acquired absence of both cervix and uterus: Secondary | ICD-10-CM | POA: Insufficient documentation

## 2014-01-24 DIAGNOSIS — G4730 Sleep apnea, unspecified: Secondary | ICD-10-CM | POA: Insufficient documentation

## 2014-01-24 DIAGNOSIS — M1990 Unspecified osteoarthritis, unspecified site: Secondary | ICD-10-CM | POA: Insufficient documentation

## 2014-01-24 DIAGNOSIS — F3289 Other specified depressive episodes: Secondary | ICD-10-CM | POA: Insufficient documentation

## 2014-01-24 LAB — URINALYSIS, ROUTINE W REFLEX MICROSCOPIC
BILIRUBIN URINE: NEGATIVE
Glucose, UA: NEGATIVE mg/dL
KETONES UR: NEGATIVE mg/dL
Nitrite: NEGATIVE
PROTEIN: NEGATIVE mg/dL
Specific Gravity, Urine: 1.015 (ref 1.005–1.030)
UROBILINOGEN UA: 0.2 mg/dL (ref 0.0–1.0)
pH: 6 (ref 5.0–8.0)

## 2014-01-24 LAB — COMPREHENSIVE METABOLIC PANEL
ALK PHOS: 80 U/L (ref 39–117)
ALT: 19 U/L (ref 0–35)
AST: 25 U/L (ref 0–37)
Albumin: 4.1 g/dL (ref 3.5–5.2)
BILIRUBIN TOTAL: 0.7 mg/dL (ref 0.3–1.2)
BUN: 21 mg/dL (ref 6–23)
CHLORIDE: 100 meq/L (ref 96–112)
CO2: 28 meq/L (ref 19–32)
CREATININE: 0.76 mg/dL (ref 0.50–1.10)
Calcium: 8.7 mg/dL (ref 8.4–10.5)
GFR calc Af Amer: >90 mL/min (ref >90)
GFR, EST NON AFRICAN AMERICAN: 82 mL/min — AB (ref >90)
Glucose, Bld: 112 mg/dL — ABNORMAL HIGH (ref 70–99)
Potassium: 4.2 mEq/L (ref 3.7–5.3)
SODIUM: 140 meq/L (ref 137–147)
Total Protein: 7.5 g/dL (ref 6.0–8.3)

## 2014-01-24 LAB — CBC WITH DIFFERENTIAL/PLATELET
BASOS ABS: 0 10*3/uL (ref 0.0–0.1)
Basophils Relative: 0 % (ref 0–1)
Eosinophils Absolute: 0 10*3/uL (ref 0.0–0.7)
Eosinophils Relative: 0 % (ref 0–5)
HEMATOCRIT: 39.1 % (ref 36.0–46.0)
Hemoglobin: 13.4 g/dL (ref 12.0–15.0)
LYMPHS PCT: 11 % — AB (ref 12–46)
Lymphs Abs: 1.2 10*3/uL (ref 0.7–4.0)
MCH: 32.2 pg (ref 26.0–34.0)
MCHC: 34.3 g/dL (ref 30.0–36.0)
MCV: 94 fL (ref 78.0–100.0)
MONO ABS: 1.1 10*3/uL — AB (ref 0.1–1.0)
Monocytes Relative: 11 % (ref 3–12)
NEUTROS ABS: 8.1 10*3/uL — AB (ref 1.7–7.7)
Neutrophils Relative %: 78 % — ABNORMAL HIGH (ref 43–77)
Platelets: 244 10*3/uL (ref 150–400)
RBC: 4.16 MIL/uL (ref 3.87–5.11)
RDW: 12.7 % (ref 11.5–15.5)
WBC: 10.4 10*3/uL (ref 4.0–10.5)

## 2014-01-24 LAB — URINE MICROSCOPIC-ADD ON

## 2014-01-24 MED ORDER — HYDROCODONE-ACETAMINOPHEN 5-325 MG PO TABS: 2.0000 | ORAL_TABLET | ORAL | Status: DC | PRN

## 2014-01-24 MED ORDER — METRONIDAZOLE 500 MG PO TABS: 500.0000 mg | ORAL_TABLET | Freq: Three times a day (TID) | ORAL | Status: DC

## 2014-01-24 MED ORDER — CIPROFLOXACIN HCL 500 MG PO TABS: 500.0000 mg | ORAL_TABLET | Freq: Two times a day (BID) | ORAL | Status: DC

## 2014-01-24 NOTE — Discharge Instructions (Signed)
Cipro and Flagyl as prescribed.  Hydrocodone as prescribed as needed for pain.  Return to the emergency department if you develop worsening pain, worsening bloody stools, high fever, or other new or concerning symptoms. Also, return to the ER if you're not improving in the next 48 hours.   Diverticulitis A diverticulum is a small pouch or sac on the colon. Diverticulosis is the presence of these diverticula on the colon. Diverticulitis is the irritation (inflammation) or infection of diverticula. CAUSES  The colon and its diverticula contain bacteria. If food particles block the tiny opening to a diverticulum, the bacteria inside can grow and cause an increase in pressure. This leads to infection and inflammation and is called diverticulitis. SYMPTOMS   Abdominal pain and tenderness. Usually, the pain is located on the left side of your abdomen. However, it could be located elsewhere.  Fever.  Bloating.  Feeling sick to your stomach (nausea).  Throwing up (vomiting).  Abnormal stools. DIAGNOSIS  Your caregiver will take a history and perform a physical exam. Since many things can cause abdominal pain, other tests may be necessary. Tests may include:  Blood tests.  Urine tests.  X-ray of the abdomen.  CT scan of the abdomen. Sometimes, surgery is needed to determine if diverticulitis or other conditions are causing your symptoms. TREATMENT  Most of the time, you can be treated without surgery. Treatment includes:  Resting the bowels by only having liquids for a few days. As you improve, you will need to eat a low-fiber diet.  Intravenous (IV) fluids if you are losing body fluids (dehydrated).  Antibiotic medicines that treat infections may be given.  Pain and nausea medicine, if needed.  Surgery if the inflamed diverticulum has burst. HOME CARE INSTRUCTIONS   Try a clear liquid diet (broth, tea, or water for as long as directed by your caregiver). You may then  gradually begin a low-fiber diet as tolerated.  A low-fiber diet is a diet with less than 10 grams of fiber. Choose the foods below to reduce fiber in the diet:  White breads, cereals, rice, and pasta.  Cooked fruits and vegetables or soft fresh fruits and vegetables without the skin.  Ground or well-cooked tender beef, ham, veal, lamb, pork, or poultry.  Eggs and seafood.  After your diverticulitis symptoms have improved, your caregiver may put you on a high-fiber diet. A high-fiber diet includes 14 grams of fiber for every 1000 calories consumed. For a standard 2000 calorie diet, you would need 28 grams of fiber. Follow these diet guidelines to help you increase the fiber in your diet. It is important to slowly increase the amount fiber in your diet to avoid gas, constipation, and bloating.  Choose whole-grain breads, cereals, pasta, and brown rice.  Choose fresh fruits and vegetables with the skin on. Do not overcook vegetables because the more vegetables are cooked, the more fiber is lost.  Choose more nuts, seeds, legumes, dried peas, beans, and lentils.  Look for food products that have greater than 3 grams of fiber per serving on the Nutrition Facts label.  Take all medicine as directed by your caregiver.  If your caregiver has given you a follow-up appointment, it is very important that you go. Not going could result in lasting (chronic) or permanent injury, pain, and disability. If there is any problem keeping the appointment, call to reschedule. SEEK MEDICAL CARE IF:   Your pain does not improve.  You have a hard time advancing your diet beyond  clear liquids.  Your bowel movements do not return to normal. SEEK IMMEDIATE MEDICAL CARE IF:   Your pain becomes worse.  You have an oral temperature above 102 F (38.9 C), not controlled by medicine.  You have repeated vomiting.  You have bloody or black, tarry stools.  Symptoms that brought you to your caregiver become  worse or are not getting better. MAKE SURE YOU:   Understand these instructions.  Will watch your condition.  Will get help right away if you are not doing well or get worse. Document Released: 09/21/2005 Document Revised: 03/05/2012 Document Reviewed: 01/17/2011 [facility] Patient Information 2014 [place].

## 2014-01-24 NOTE — ED Notes (Signed)
Pt states she has colitis attacks from time to time, usually has lots of bleeding associated with attacks but states at the moment she only has small amt of blood in stool with abdominal pain, she wants to get checked out before it progresses.

## 2014-01-24 NOTE — ED Provider Notes (Signed)
CSN: [id number]     Arrival date & time 01/24/14  [id number] History  This chart was scribed for Veryl Speak, MD by Roe Coombs, ED Scribe. The patient was seen in room APA05/APA05. Patient's care was started at 9:50 AM.    Chief Complaint  Patient presents with  . Rectal Bleeding  . Emesis  . Abdominal Pain    The history is provided by the patient. No language interpreter was used.    HPI Comments: Crystal [name] is a 73 y.o. female with history of ischemic colitis who presents to the Emergency Department complaining of intermittent lower abdominal cramping onset last night. She also reports seeing dark blood in her stools last night. She denies fever, chest pain, shortness of breath. She was hospitalized in 2013 with similar symptoms which was diagnosed as ischemic colitis and she was treated with antibiotics. She had a colonoscopy last year that was unremarkable. She has a history of abdominal hysterectomy and tubal ligation, but no other abdominal surgeries.    Past Medical History  Diagnosis Date  . 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 Diverticulosis   . Tubular adenoma of colon 08/2007  . HTN (hypertension)   . Depression   . GERD (gastroesophageal reflux disease)   . Chronic fatigue   . OA (osteoarthritis)   . Spinal stenosis   . Sleep apnea     cpap  . Obesity   . PONV (postoperative nausea and vomiting)    Past Surgical History  Procedure Laterality Date  . Tubal ligation  1970    hysterectomy  . Abdominal hysterectomy      partial  . Breast cyst excision      benign, left x2, right x1  . Cataract extraction w/phaco  12/01/2011    Procedure: CATARACT EXTRACTION PHACO AND INTRAOCULAR LENS PLACEMENT (IOC);  Surgeon: Tonny Branch;  Location: [facility];  Service: Ophthalmology;  Laterality: Right;  CDE=14.87  . Cataract extraction w/phaco  02/06/2012    Procedure: CATARACT EXTRACTION PHACO AND INTRAOCULAR LENS  PLACEMENT (IOC);  Surgeon: Tonny Branch, MD;  Location: [facility];  Service: Ophthalmology;  Laterality: Left;  CDE 13.00  . Colonoscopy  Sept 2008    Last colonoscopy/bx by Dr Rourk->(08/31/07) descending colon TUBULAR ADENOMA  . Colonoscopy  10/25/2012    Procedure: COLONOSCOPY;  Surgeon: Daneil Dolin, MD;  Location: [facility];  Service: Endoscopy;  Laterality: N/A;  10:15-changed to 10:30 Darius Bump notified   Family History  Problem Relation Age of Onset  . Cancer Brother 75  . Coronary artery disease Brother   . Diabetes Sister   . Anesthesia problems Neg Hx   . Hypotension Neg Hx   . Pseudochol deficiency Neg Hx   . Malignant hyperthermia Neg Hx   . Colon cancer Neg Hx    History  Substance Use Topics  . Smoking status: Never Smoker   . Smokeless tobacco: Never Used  . Alcohol Use: No   OB History   Grav Para Term Preterm Abortions TAB SAB Ect Mult Living                 Review of Systems A complete 10 system review of systems was obtained and all systems are negative except as noted in the HPI and PMH.   Allergies  Review of patient's allergies indicates no known allergies.  Home Medications   Current Outpatient Rx  Name  Route  Sig  Dispense  Refill  . acetaminophen (TYLENOL ARTHRITIS PAIN) 650 MG CR tablet   Oral   Take 1,300 mg by mouth 2 (two) times daily.          Marland Kitchen amLODipine (NORVASC) 10 MG tablet   Oral   Take 10 mg by mouth daily.          Marland Kitchen aspirin EC 81 MG tablet   Oral   Take 81 mg by mouth daily.           Marland Kitchen buPROPion (WELLBUTRIN SR) 150 MG 12 hr tablet   Oral   Take 150 mg by mouth 2 (two) times daily.          . cycloSPORINE (RESTASIS) 0.05 % ophthalmic emulsion   Both Eyes   Place 1 drop into both eyes 2 (two) times daily as needed. For dry eyes         . felodipine (PLENDIL) 10 MG 24 hr tablet   Oral   Take 10 mg by mouth daily.          Marland Kitchen FLUoxetine (PROZAC) 20 MG capsule   Oral   Take 60 mg by mouth daily.           Marland Kitchen HYDROcodone-acetaminophen (NORCO) 5-325 MG per tablet      1 at hs or q6h prn pain   15 tablet   0   . loratadine (CLARITIN) 10 MG tablet   Oral   Take 10 mg by mouth daily.           Marland Kitchen losartan-hydrochlorothiazide (HYZAAR) 100-25 MG per tablet   Oral   Take 1 tablet by mouth daily.         Marland Kitchen omeprazole (PRILOSEC) 20 MG capsule   Oral   Take 20 mg by mouth daily.          . simvastatin (ZOCOR) 20 MG tablet   Oral   Take 20 mg by mouth at bedtime.            Triage Vitals: BP 145/82  Pulse 102  Temp(Src) 99 F (37.2 C) (Oral)  Resp 20  Ht 5\' 3"  (1.6 m)  Wt 245 lb (111.131 kg)  BMI 43.41 kg/m2  SpO2 97% Physical Exam  Constitutional: She is oriented to person, place, and time. She appears well-developed and well-nourished. No distress.  HENT:  Head: Normocephalic and atraumatic.  Eyes: Conjunctivae and EOM are normal. Pupils are equal, round, and reactive to light.  Neck: Normal range of motion. Neck supple.  Cardiovascular: Normal rate, regular rhythm and normal heart sounds.   Pulmonary/Chest: Effort normal and breath sounds normal. No respiratory distress. She has no wheezes. She has no rales.  Abdominal: Soft. Bowel sounds are normal. There is tenderness. There is no rebound and no guarding.  Tenderness to palpation of LLQ.  Musculoskeletal: Normal range of motion. She exhibits no edema and no tenderness.  Neurological: She is alert and oriented to person, place, and time.  Skin: Skin is warm and dry.  Psychiatric: She has a normal mood and affect. Her behavior is normal.    ED Course  Procedures (including critical care time) DIAGNOSTIC STUDIES: Oxygen Saturation is 97% on room air, adequate by my interpretation.    COORDINATION OF CARE: 9:57 AM- Patient informed of current plan for treatment and evaluation and agrees with plan at this time.     Labs Review Labs Reviewed - No data to display Imaging Review No results found.  MDM  No  diagnosis found. Patient presents here with complaints of a several day history of left lower quadrant pain, loose stools that are tinged with blood, and generally not feeling well. She has a history of colitis and subsequent colonoscopy revealed diverticulosis. I suspect that her symptoms are related to one of these. Her laboratory studies are essentially unremarkable and urine is clear. There is tenderness to palpation in the left lower quadrant however there is no rebound or guarding and no evidence for an acute abdomen. I will treat with Cipro and Flagyl and pain medication and give this time. If she does not improve or worsens over the next 48 hours, she understands to return to the ER for a CT scan.   I personally performed the services described in this documentation, which was scribed in my presence. The recorded information has been reviewed and is accurate.      Veryl Speak, MD 01/24/14 [phone]

## 2014-01-24 NOTE — ED Notes (Signed)
Patient given discharge instruction, verbalized understand. Patient ambulatory out of the department.  

## 2014-01-24 NOTE — ED Notes (Signed)
Patient c/o abd pain with nausea, vomiting, diarrhea, and chills. Patient reports noting some dark blood in stools last night. Per patient she had abd cramping last night but now has abd pressure. Patient reports having colitis a year ago that felt like this.

## 2014-11-24 ENCOUNTER — Ambulatory Visit (HOSPITAL_COMMUNITY)
Admission: RE | Admit: 2014-11-24 | Discharge: 2014-11-24 | Disposition: A | Discharge status: Home / Self Care | Payer: Commercial Managed Care - HMO | Source: Ambulatory Visit | Attending: Internal Medicine | Admitting: Internal Medicine

## 2014-11-24 ENCOUNTER — Other Ambulatory Visit (HOSPITAL_COMMUNITY): Payer: Self-pay | Admitting: Internal Medicine

## 2014-11-24 DIAGNOSIS — J9811 Atelectasis: Secondary | ICD-10-CM | POA: Insufficient documentation

## 2014-11-24 DIAGNOSIS — R05 Cough: Secondary | ICD-10-CM | POA: Insufficient documentation

## 2014-11-24 DIAGNOSIS — R0602 Shortness of breath: Secondary | ICD-10-CM | POA: Insufficient documentation

## 2014-11-24 DIAGNOSIS — R059 Cough, unspecified: Secondary | ICD-10-CM

## 2015-01-14 DIAGNOSIS — E785 Hyperlipidemia, unspecified: Secondary | ICD-10-CM | POA: Diagnosis not present

## 2015-01-14 DIAGNOSIS — Z79899 Other long term (current) drug therapy: Secondary | ICD-10-CM | POA: Diagnosis not present

## 2015-01-14 DIAGNOSIS — E040 Nontoxic diffuse goiter: Secondary | ICD-10-CM | POA: Diagnosis not present

## 2015-01-14 DIAGNOSIS — I10 Essential (primary) hypertension: Secondary | ICD-10-CM | POA: Diagnosis not present

## 2015-01-15 ENCOUNTER — Other Ambulatory Visit (HOSPITAL_COMMUNITY): Payer: Self-pay | Admitting: Internal Medicine

## 2015-01-15 DIAGNOSIS — Z1231 Encounter for screening mammogram for malignant neoplasm of breast: Secondary | ICD-10-CM

## 2015-01-21 ENCOUNTER — Ambulatory Visit (HOSPITAL_COMMUNITY)
Admission: RE | Admit: 2015-01-21 | Discharge: 2015-01-21 | Disposition: A | Discharge status: Home / Self Care | Payer: Commercial Managed Care - HMO | Source: Ambulatory Visit | Attending: Internal Medicine | Admitting: Internal Medicine

## 2015-01-21 DIAGNOSIS — Z1231 Encounter for screening mammogram for malignant neoplasm of breast: Secondary | ICD-10-CM | POA: Diagnosis not present

## 2015-01-21 DIAGNOSIS — R928 Other abnormal and inconclusive findings on diagnostic imaging of breast: Secondary | ICD-10-CM | POA: Diagnosis not present

## 2015-01-22 ENCOUNTER — Other Ambulatory Visit: Payer: Self-pay | Admitting: Internal Medicine

## 2015-01-22 DIAGNOSIS — R928 Other abnormal and inconclusive findings on diagnostic imaging of breast: Secondary | ICD-10-CM

## 2015-02-02 DIAGNOSIS — E785 Hyperlipidemia, unspecified: Secondary | ICD-10-CM | POA: Diagnosis not present

## 2015-02-02 DIAGNOSIS — E049 Nontoxic goiter, unspecified: Secondary | ICD-10-CM | POA: Diagnosis not present

## 2015-02-02 DIAGNOSIS — M1990 Unspecified osteoarthritis, unspecified site: Secondary | ICD-10-CM | POA: Diagnosis not present

## 2015-02-02 DIAGNOSIS — I10 Essential (primary) hypertension: Secondary | ICD-10-CM | POA: Diagnosis not present

## 2015-02-11 ENCOUNTER — Other Ambulatory Visit: Payer: Self-pay | Admitting: Internal Medicine

## 2015-02-11 ENCOUNTER — Ambulatory Visit
Admission: RE | Admit: 2015-02-11 | Discharge: 2015-02-11 | Disposition: A | Discharge status: Home / Self Care | Payer: Commercial Managed Care - HMO | Source: Ambulatory Visit | Attending: Internal Medicine | Admitting: Internal Medicine

## 2015-02-11 ENCOUNTER — Ambulatory Visit
Admission: RE | Admit: 2015-02-11 | Discharge: 2015-02-11 | Disposition: A | Discharge status: Home / Self Care | Payer: PRIVATE HEALTH INSURANCE | Source: Ambulatory Visit | Attending: Internal Medicine | Admitting: Internal Medicine

## 2015-02-11 DIAGNOSIS — N6310 Unspecified lump in the right breast, unspecified quadrant: Secondary | ICD-10-CM

## 2015-02-11 DIAGNOSIS — R922 Inconclusive mammogram: Secondary | ICD-10-CM | POA: Diagnosis not present

## 2015-02-11 DIAGNOSIS — R928 Other abnormal and inconclusive findings on diagnostic imaging of breast: Secondary | ICD-10-CM

## 2015-02-16 ENCOUNTER — Other Ambulatory Visit: Payer: Self-pay | Admitting: Internal Medicine

## 2015-02-16 DIAGNOSIS — N6310 Unspecified lump in the right breast, unspecified quadrant: Secondary | ICD-10-CM

## 2015-02-19 ENCOUNTER — Ambulatory Visit
Admission: RE | Admit: 2015-02-19 | Discharge: 2015-02-19 | Disposition: A | Discharge status: Home / Self Care | Payer: Commercial Managed Care - HMO | Source: Ambulatory Visit | Attending: Internal Medicine | Admitting: Internal Medicine

## 2015-02-19 DIAGNOSIS — N6310 Unspecified lump in the right breast, unspecified quadrant: Secondary | ICD-10-CM

## 2015-02-19 DIAGNOSIS — C50411 Malignant neoplasm of upper-outer quadrant of right female breast: Secondary | ICD-10-CM | POA: Diagnosis not present

## 2015-02-19 DIAGNOSIS — N63 Unspecified lump in breast: Secondary | ICD-10-CM | POA: Diagnosis not present

## 2015-02-19 DIAGNOSIS — D0511 Intraductal carcinoma in situ of right breast: Secondary | ICD-10-CM | POA: Diagnosis not present

## 2015-02-20 ENCOUNTER — Other Ambulatory Visit: Payer: Self-pay | Admitting: Internal Medicine

## 2015-02-20 DIAGNOSIS — C50911 Malignant neoplasm of unspecified site of right female breast: Secondary | ICD-10-CM

## 2015-02-26 ENCOUNTER — Ambulatory Visit
Admission: RE | Admit: 2015-02-26 | Discharge: 2015-02-26 | Disposition: A | Discharge status: Home / Self Care | Payer: Commercial Managed Care - HMO | Source: Ambulatory Visit | Attending: Internal Medicine | Admitting: Internal Medicine

## 2015-02-26 DIAGNOSIS — C50911 Malignant neoplasm of unspecified site of right female breast: Secondary | ICD-10-CM | POA: Diagnosis not present

## 2015-02-26 MED ORDER — GADOBENATE DIMEGLUMINE 529 MG/ML IV SOLN
20.0000 mL | Freq: Once | INTRAVENOUS | Status: AC | PRN
  Administered 2015-02-26: 20 mL via INTRAVENOUS

## 2015-02-27 ENCOUNTER — Other Ambulatory Visit: Payer: Self-pay | Admitting: Internal Medicine

## 2015-02-27 DIAGNOSIS — C50411 Malignant neoplasm of upper-outer quadrant of right female breast: Secondary | ICD-10-CM | POA: Diagnosis not present

## 2015-02-27 DIAGNOSIS — D0511 Intraductal carcinoma in situ of right breast: Secondary | ICD-10-CM | POA: Diagnosis not present

## 2015-02-27 DIAGNOSIS — R928 Other abnormal and inconclusive findings on diagnostic imaging of breast: Secondary | ICD-10-CM

## 2015-03-02 ENCOUNTER — Ambulatory Visit
Admission: RE | Admit: 2015-03-02 | Discharge: 2015-03-02 | Disposition: A | Discharge status: Home / Self Care | Payer: Commercial Managed Care - HMO | Source: Ambulatory Visit | Attending: Internal Medicine | Admitting: Internal Medicine

## 2015-03-02 DIAGNOSIS — R928 Other abnormal and inconclusive findings on diagnostic imaging of breast: Secondary | ICD-10-CM

## 2015-03-05 DIAGNOSIS — C50911 Malignant neoplasm of unspecified site of right female breast: Secondary | ICD-10-CM | POA: Diagnosis not present

## 2015-03-10 ENCOUNTER — Other Ambulatory Visit (HOSPITAL_COMMUNITY): Payer: Self-pay | Admitting: General Surgery

## 2015-03-10 ENCOUNTER — Other Ambulatory Visit (HOSPITAL_COMMUNITY): Payer: Self-pay | Admitting: Orthopedic Surgery

## 2015-03-10 DIAGNOSIS — C50911 Malignant neoplasm of unspecified site of right female breast: Secondary | ICD-10-CM

## 2015-03-10 NOTE — H&P (Signed)
  NTS SOAP Note  Vital Signs:  Vitals as of: 1/61/0960: Systolic 454: Diastolic 82: Heart Rate 92: Temp 98.31F: Height 45f 4in: Weight 238Lbs 0 Ounces: BMI 40.85  BMI : 40.85 kg/m2  Subjective: This 74year old female presents for of a right breast cancer.  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  No family h/o breast cancer.  Found on routine mammography.  Review of Symptoms:  Constitutional:unremarkable   Head:unremarkable Eyes:unremarkable   sinus problems Cardiovascular:  unremarkable Respiratory:unremarkable Gastrointestinal:  unremarkable   Genitourinary:unremarkable   joint,  neck,  and back pain Skin:unremarkable enlarged thryoid gland Allergic/Immunologic:unremarkable   Past Medical History:  Reviewed  Past Medical History  Surgical History: BTL,  TAH Medical Problems: HTN,  reflux,  high cholesterol Allergies: nkda Medications: fluoxetine,  losartan,  baby asa,  omeprazolek,  simvastatin,  restasis,  bupropion,  amlodipine   Social History:Reviewed  Social History  Preferred Language: English Race:  White Ethnicity: Not Hispanic / Latino Age: [age]year Marital Status:  W Alcohol: no   Smoking Status: Never smoker reviewed on 03/05/2015 Functional Status reviewed on 03/05/2015 ------------------------------------------------ Bathing: Normal Cooking: Normal Dressing: Normal Driving: Normal Eating: Normal Managing Meds: Normal Oral Care: Normal Shopping: Normal Toileting: Normal Transferring: Normal Walking: Normal Cognitive Status reviewed on 03/05/2015 ------------------------------------------------ Attention: Normal Decision Making: Normal Language: Normal Memory: Normal Motor: Normal Perception: Normal Problem Solving: Normal Visual and Spatial: Normal   Family History:Reviewed  Family Health History Mother, Deceased; Healthy;  Father, Deceased; Healthy;      Objective Information: General:Well appearing, well nourished in no distress. Slightly enlarged thryoid gland,  no apparant tracheal deviation Heart:RRR, no murmur Lungs:  CTA bilaterally, no wheezes, rhonchi, rales.  Breathing unlabored. Palpable dominant mass in the lateral aspect of the right breast,  10 o'clock position.  No nipple discharge,  dimpling.  Axilla negative for palpable nodes.  Left breast negative. MRI of breast reviewed Assessment:Right breast carcinoma,  clinical stage 1  Diagnoses: 174.9  C50.911 Primary malignant neoplasm of female breast (Malignant neoplasm of unspecified site of right female breast)  Procedures: [phone]- OFFICE OUTPATIENT NEW 30 MINUTES    Plan:  Discussed surgical options including modified radical mastectomy vs lumpectomy,  sentinel lymph node biopsy,  XRT with patient and family.  All questions answered.  Patient would like to proceed with right partial mastectomy after needle localization, sentinel lymph node biopsy, possible axillary dissection on 03/18/15.   Patient Education:Alternative treatments to surgery were discussed with patient (and family).  Risks and benefits  of procedure including bleeding, infection, nerve injury, arm swelling, and possibility of unclear margins were fully explained to the patient (and family) who gave informed consent. Patient/family questions were addressed.

## 2015-03-11 DIAGNOSIS — L03039 Cellulitis of unspecified toe: Secondary | ICD-10-CM | POA: Diagnosis not present

## 2015-03-13 NOTE — Patient Instructions (Signed)
Crystal [name]  03/13/2015   Your procedure is scheduled on:  03/18/2015  Report to [facility] at 9:00 AM.  Call this number if you have problems the morning of surgery: [phone]   Remember:   Do not eat food or drink liquids after midnight.   Take these medicines the morning of surgery with A SIP OF WATER: Amlodipine, Wellbutrin, Restasis, Prozac, Flonase, Claritin  Prilosec   Do not wear jewelry, make-up or nail polish.  Do not wear lotions, powders, or perfumes. You may wear deodorant.  Do not shave 48 hours prior to surgery. Men may shave face and neck.  Do not bring valuables to the hospital.  [facility] is not responsible for any belongings or valuables.               Contacts, dentures or bridgework may not be worn into surgery.  Leave suitcase in the car. After surgery it may be brought to your room.  For patients admitted to the hospital, discharge time is determined by your treatment team.               Patients discharged the day of surgery will not be allowed to drive home.  Name and phone number of your driver:   Special Instructions: Shower using CHG 2 nights before surgery and the night before surgery.  If you shower the day of surgery use CHG.  Use special wash - you have one bottle of CHG for all showers.  You should use approximately 1/3 of the bottle for each shower.   Please read over the following fact sheets that you were given: Surgical Site Infection Prevention and Anesthesia Post-op Instructions   PATIENT INSTRUCTIONS POST-ANESTHESIA  IMMEDIATELY FOLLOWING SURGERY:  Do not drive or operate machinery for the first twenty four hours after surgery.  Do not make any important decisions for twenty four hours after surgery or while taking narcotic pain medications or sedatives.  If you develop intractable nausea and vomiting or a severe headache please notify your doctor immediately.  FOLLOW-UP:  Please make an appointment with your surgeon as instructed. You  do not need to follow up with anesthesia unless specifically instructed to do so.  WOUND CARE INSTRUCTIONS (if applicable):  Keep a dry clean dressing on the anesthesia/puncture wound site if there is drainage.  Once the wound has quit draining you may leave it open to air.  Generally you should leave the bandage intact for twenty four hours unless there is drainage.  If the epidural site drains for more than 36-48 hours please call the anesthesia department.  QUESTIONS?:  Please feel free to call your physician or the hospital operator if you have any questions, and they will be happy to assist you.

## 2015-03-16 ENCOUNTER — Encounter (HOSPITAL_COMMUNITY): Payer: Self-pay

## 2015-03-16 ENCOUNTER — Encounter (HOSPITAL_COMMUNITY)
Admission: RE | Admit: 2015-03-16 | Discharge: 2015-03-16 | Disposition: A | Discharge status: Home / Self Care | Payer: Commercial Managed Care - HMO | Source: Ambulatory Visit | Attending: General Surgery | Admitting: General Surgery

## 2015-03-16 ENCOUNTER — Other Ambulatory Visit: Payer: Self-pay

## 2015-03-16 ENCOUNTER — Ambulatory Visit (HOSPITAL_COMMUNITY)
Admission: RE | Admit: 2015-03-16 | Discharge: 2015-03-16 | Disposition: A | Discharge status: Home / Self Care | Payer: Commercial Managed Care - HMO | Source: Ambulatory Visit | Attending: General Surgery | Admitting: General Surgery

## 2015-03-16 VITALS — BP 113/56 | HR 84 | Temp 98.2°F | Resp 20 | Ht 64.0 in | Wt 235.0 lb

## 2015-03-16 DIAGNOSIS — C50911 Malignant neoplasm of unspecified site of right female breast: Secondary | ICD-10-CM

## 2015-03-16 DIAGNOSIS — Z01818 Encounter for other preprocedural examination: Secondary | ICD-10-CM | POA: Insufficient documentation

## 2015-03-16 DIAGNOSIS — C50919 Malignant neoplasm of unspecified site of unspecified female breast: Secondary | ICD-10-CM

## 2015-03-16 DIAGNOSIS — Z01811 Encounter for preprocedural respiratory examination: Secondary | ICD-10-CM | POA: Insufficient documentation

## 2015-03-16 HISTORY — DX: Nontoxic goiter, unspecified: E04.9

## 2015-03-16 LAB — COMPREHENSIVE METABOLIC PANEL
ALK PHOS: 74 U/L (ref 39–117)
ALT: 35 U/L (ref 0–35)
AST: 40 U/L — ABNORMAL HIGH (ref 0–37)
Albumin: 4.4 g/dL (ref 3.5–5.2)
Anion gap: 11 (ref 5–15)
BUN: 14 mg/dL (ref 6–23)
CO2: 24 mmol/L (ref 19–32)
Calcium: 9.5 mg/dL (ref 8.4–10.5)
Chloride: 103 mmol/L (ref 96–112)
Creatinine, Ser: 0.97 mg/dL (ref 0.50–1.10)
GFR calc Af Amer: 65 mL/min — ABNORMAL LOW (ref >90)
GFR, EST NON AFRICAN AMERICAN: 56 mL/min — AB (ref >90)
GLUCOSE: 79 mg/dL (ref 70–99)
POTASSIUM: 4.7 mmol/L (ref 3.5–5.1)
SODIUM: 138 mmol/L (ref 135–145)
TOTAL PROTEIN: 7.2 g/dL (ref 6.0–8.3)
Total Bilirubin: 0.7 mg/dL (ref 0.3–1.2)

## 2015-03-16 LAB — CBC WITH DIFFERENTIAL/PLATELET
BASOS ABS: 0 10*3/uL (ref 0.0–0.1)
Basophils Relative: 1 % (ref 0–1)
EOS PCT: 7 % — AB (ref 0–5)
Eosinophils Absolute: 0.3 10*3/uL (ref 0.0–0.7)
HEMATOCRIT: 41.3 % (ref 36.0–46.0)
HEMOGLOBIN: 14 g/dL (ref 12.0–15.0)
Lymphocytes Relative: 28 % (ref 12–46)
Lymphs Abs: 1.1 10*3/uL (ref 0.7–4.0)
MCH: 32.2 pg (ref 26.0–34.0)
MCHC: 33.9 g/dL (ref 30.0–36.0)
MCV: 94.9 fL (ref 78.0–100.0)
Monocytes Absolute: 0.6 10*3/uL (ref 0.1–1.0)
Monocytes Relative: 14 % — ABNORMAL HIGH (ref 3–12)
NEUTROS ABS: 2 10*3/uL (ref 1.7–7.7)
Neutrophils Relative %: 50 % (ref 43–77)
Platelets: 257 10*3/uL (ref 150–400)
RBC: 4.35 MIL/uL (ref 3.87–5.11)
RDW: 14 % (ref 11.5–15.5)
WBC: 3.9 10*3/uL — ABNORMAL LOW (ref 4.0–10.5)

## 2015-03-16 NOTE — Pre-Procedure Instructions (Signed)
Patient given information to sign up for my chart at home. 

## 2015-03-18 ENCOUNTER — Ambulatory Visit (HOSPITAL_COMMUNITY)
Admission: RE | Admit: 2015-03-18 | Discharge: 2015-03-18 | Disposition: A | Discharge status: Home / Self Care | Payer: Commercial Managed Care - HMO | Source: Ambulatory Visit | Attending: General Surgery | Admitting: General Surgery

## 2015-03-18 ENCOUNTER — Ambulatory Visit (HOSPITAL_COMMUNITY): Payer: Commercial Managed Care - HMO | Admitting: Anesthesiology

## 2015-03-18 ENCOUNTER — Ambulatory Visit (HOSPITAL_COMMUNITY)
Admission: RE | Admit: 2015-03-18 | Discharge: 2015-03-18 | Disposition: A | Discharge status: Home / Self Care | Payer: Commercial Managed Care - HMO | Source: Ambulatory Visit | Attending: Orthopedic Surgery | Admitting: Orthopedic Surgery

## 2015-03-18 ENCOUNTER — Encounter (HOSPITAL_COMMUNITY): Payer: Commercial Managed Care - HMO

## 2015-03-18 ENCOUNTER — Other Ambulatory Visit (HOSPITAL_COMMUNITY): Payer: Self-pay | Admitting: General Surgery

## 2015-03-18 ENCOUNTER — Encounter (HOSPITAL_COMMUNITY): Payer: Self-pay | Admitting: *Deleted

## 2015-03-18 ENCOUNTER — Encounter (HOSPITAL_COMMUNITY)
Admission: RE | Admit: 2015-03-18 | Discharge: 2015-03-18 | Disposition: A | Discharge status: Home / Self Care | Payer: Commercial Managed Care - HMO | Source: Ambulatory Visit | Attending: General Surgery | Admitting: General Surgery

## 2015-03-18 ENCOUNTER — Encounter (HOSPITAL_COMMUNITY)
Admission: RE | Disposition: A | Discharge status: Home / Self Care | Payer: Self-pay | Source: Ambulatory Visit | Attending: General Surgery

## 2015-03-18 DIAGNOSIS — C50911 Malignant neoplasm of unspecified site of right female breast: Secondary | ICD-10-CM

## 2015-03-18 DIAGNOSIS — N6310 Unspecified lump in the right breast, unspecified quadrant: Secondary | ICD-10-CM

## 2015-03-18 HISTORY — PX: PARTIAL MASTECTOMY WITH NEEDLE LOCALIZATION AND AXILLARY SENTINEL LYMPH NODE BX: SHX6009

## 2015-03-18 SURGERY — PARTIAL MASTECTOMY WITH NEEDLE LOCALIZATION AND AXILLARY SENTINEL LYMPH NODE BX
Anesthesia: General | Site: Breast | Laterality: Right

## 2015-03-18 MED ORDER — ROCURONIUM BROMIDE 100 MG/10ML IV SOLN
INTRAVENOUS | Status: DC | PRN
  Administered 2015-03-18: 25 mg via INTRAVENOUS
  Administered 2015-03-18: 5 mg via INTRAVENOUS

## 2015-03-18 MED ORDER — PROPOFOL 10 MG/ML IV BOLUS
INTRAVENOUS | Status: AC
  Filled 2015-03-18: qty 20

## 2015-03-18 MED ORDER — GLYCOPYRROLATE 0.2 MG/ML IJ SOLN
INTRAMUSCULAR | Status: DC | PRN
  Administered 2015-03-18: .4 mg via INTRAVENOUS

## 2015-03-18 MED ORDER — CEFAZOLIN SODIUM-DEXTROSE 2-3 GM-% IV SOLR
INTRAVENOUS | Status: AC
  Filled 2015-03-18: qty 50

## 2015-03-18 MED ORDER — CEFAZOLIN SODIUM-DEXTROSE 2-3 GM-% IV SOLR
2.0000 g | INTRAVENOUS | Status: AC
  Administered 2015-03-18: 2 g via INTRAVENOUS

## 2015-03-18 MED ORDER — LACTATED RINGERS IV SOLN
INTRAVENOUS | Status: DC
  Administered 2015-03-18: 10:00:00 via INTRAVENOUS

## 2015-03-18 MED ORDER — TECHNETIUM TC 99M SULFUR COLLOID FILTERED
0.5000 | Freq: Once | INTRAVENOUS | Status: AC | PRN
  Administered 2015-03-18: 0.5 via INTRADERMAL

## 2015-03-18 MED ORDER — LIDOCAINE HCL 1 % IJ SOLN
INTRAMUSCULAR | Status: DC | PRN
  Administered 2015-03-18: 30 mg via INTRADERMAL
  Administered 2015-03-18: 100 mg via INTRADERMAL
  Administered 2015-03-18: 40 mg via INTRADERMAL

## 2015-03-18 MED ORDER — SODIUM CHLORIDE 0.9 % IJ SOLN
INTRAMUSCULAR | Status: DC | PRN
  Administered 2015-03-18: 3 mL

## 2015-03-18 MED ORDER — METHYLENE BLUE 1 % INJ SOLN
INTRAMUSCULAR | Status: AC
  Filled 2015-03-18: qty 2

## 2015-03-18 MED ORDER — ONDANSETRON HCL 4 MG/2ML IJ SOLN
4.0000 mg | Freq: Once | INTRAMUSCULAR | Status: AC
  Administered 2015-03-18: 4 mg via INTRAVENOUS

## 2015-03-18 MED ORDER — FENTANYL CITRATE 0.05 MG/ML IJ SOLN
INTRAMUSCULAR | Status: DC | PRN
  Administered 2015-03-18 (×4): 50 ug via INTRAVENOUS

## 2015-03-18 MED ORDER — SODIUM CHLORIDE 0.9 % IJ SOLN
INTRAMUSCULAR | Status: AC
  Filled 2015-03-18: qty 10

## 2015-03-18 MED ORDER — 0.9 % SODIUM CHLORIDE (POUR BTL) OPTIME
TOPICAL | Status: DC | PRN
  Administered 2015-03-18: 1000 mL

## 2015-03-18 MED ORDER — CHLORHEXIDINE GLUCONATE 4 % EX LIQD: 1.0000 "application " | Freq: Once | CUTANEOUS | Status: DC

## 2015-03-18 MED ORDER — MIDAZOLAM HCL 2 MG/2ML IJ SOLN
INTRAMUSCULAR | Status: AC
  Filled 2015-03-18: qty 2

## 2015-03-18 MED ORDER — NEOSTIGMINE METHYLSULFATE 10 MG/10ML IV SOLN
INTRAVENOUS | Status: AC
  Filled 2015-03-18: qty 1

## 2015-03-18 MED ORDER — LIDOCAINE HCL (PF) 1 % IJ SOLN
INTRAMUSCULAR | Status: AC
  Filled 2015-03-18: qty 5

## 2015-03-18 MED ORDER — EPHEDRINE SULFATE 50 MG/ML IJ SOLN
INTRAMUSCULAR | Status: AC
  Filled 2015-03-18: qty 1

## 2015-03-18 MED ORDER — NEOSTIGMINE METHYLSULFATE 10 MG/10ML IV SOLN
INTRAVENOUS | Status: DC | PRN
  Administered 2015-03-18: 2 mg via INTRAVENOUS

## 2015-03-18 MED ORDER — FENTANYL CITRATE 0.05 MG/ML IJ SOLN: 25.0000 ug | INTRAMUSCULAR | Status: DC | PRN

## 2015-03-18 MED ORDER — MIDAZOLAM HCL 2 MG/2ML IJ SOLN
1.0000 mg | INTRAMUSCULAR | Status: DC | PRN
  Administered 2015-03-18: 2 mg via INTRAVENOUS

## 2015-03-18 MED ORDER — ONDANSETRON HCL 4 MG/2ML IJ SOLN
INTRAMUSCULAR | Status: AC
  Filled 2015-03-18: qty 2

## 2015-03-18 MED ORDER — ROCURONIUM BROMIDE 50 MG/5ML IV SOLN
INTRAVENOUS | Status: AC
  Filled 2015-03-18: qty 1

## 2015-03-18 MED ORDER — BUPIVACAINE HCL (PF) 0.5 % IJ SOLN
INTRAMUSCULAR | Status: AC
  Filled 2015-03-18: qty 30

## 2015-03-18 MED ORDER — FENTANYL CITRATE 0.05 MG/ML IJ SOLN
INTRAMUSCULAR | Status: AC
  Filled 2015-03-18: qty 5

## 2015-03-18 MED ORDER — BUPIVACAINE HCL (PF) 0.5 % IJ SOLN
INTRAMUSCULAR | Status: DC | PRN
  Administered 2015-03-18: 6 mL

## 2015-03-18 MED ORDER — PENTAFLUOROPROP-TETRAFLUOROETH EX AERO
INHALATION_SPRAY | CUTANEOUS | Status: AC
  Filled 2015-03-18: qty 103.5

## 2015-03-18 MED ORDER — ONDANSETRON HCL 4 MG/2ML IJ SOLN: 4.0000 mg | Freq: Once | INTRAMUSCULAR | Status: DC | PRN

## 2015-03-18 MED ORDER — GLYCOPYRROLATE 0.2 MG/ML IJ SOLN
INTRAMUSCULAR | Status: AC
  Filled 2015-03-18: qty 2

## 2015-03-18 MED ORDER — LIDOCAINE HCL (PF) 2 % IJ SOLN
INTRAMUSCULAR | Status: AC
  Filled 2015-03-18: qty 10

## 2015-03-18 SURGICAL SUPPLY — 40 items
APPLIER CLIP 9.375 SM OPEN (CLIP) ×3
APR CLP SM 9.3 20 MLT OPN (CLIP) ×1
BAG HAMPER (MISCELLANEOUS) ×3 IMPLANT
BNDG CMPR 82X61 PLY HI ABS (GAUZE/BANDAGES/DRESSINGS)
BNDG CONFORM 6X.82 1P STRL (GAUZE/BANDAGES/DRESSINGS) IMPLANT
CLIP APPLIE 9.375 SM OPEN (CLIP) ×1 IMPLANT
CLOSURE WOUND 1/4 X3 (GAUZE/BANDAGES/DRESSINGS)
CLOTH BEACON ORANGE TIMEOUT ST (SAFETY) ×3 IMPLANT
CONT SPECI 4OZ STER CLIK (MISCELLANEOUS) ×3 IMPLANT
COVER LIGHT HANDLE STERIS (MISCELLANEOUS) ×6 IMPLANT
COVER PROBE W GEL 5X96 (DRAPES) ×3 IMPLANT
DECANTER SPIKE VIAL GLASS SM (MISCELLANEOUS) ×3 IMPLANT
DURAPREP 26ML APPLICATOR (WOUND CARE) ×3 IMPLANT
ELECT REM PT RETURN 9FT ADLT (ELECTROSURGICAL) ×3
ELECTRODE REM PT RTRN 9FT ADLT (ELECTROSURGICAL) ×1 IMPLANT
GLOVE BIOGEL PI IND STRL 7.0 (GLOVE) ×1 IMPLANT
GLOVE BIOGEL PI INDICATOR 7.0 (GLOVE) ×2
GLOVE ECLIPSE 6.5 STRL STRAW (GLOVE) ×3 IMPLANT
GLOVE EXAM NITRILE LRG STRL (GLOVE) ×3 IMPLANT
GLOVE SURG SS PI 7.5 STRL IVOR (GLOVE) ×6 IMPLANT
GOWN STRL REUS W/TWL LRG LVL3 (GOWN DISPOSABLE) ×9 IMPLANT
KIT ROOM TURNOVER APOR (KITS) ×3 IMPLANT
LIQUID BAND (GAUZE/BANDAGES/DRESSINGS) ×3 IMPLANT
MANIFOLD NEPTUNE II (INSTRUMENTS) ×3 IMPLANT
NEEDLE HYPO 18GX1.5 BLUNT FILL (NEEDLE) ×3 IMPLANT
NEEDLE HYPO 25X1 1.5 SAFETY (NEEDLE) ×3 IMPLANT
NS IRRIG 1000ML POUR BTL (IV SOLUTION) ×3 IMPLANT
PACK MINOR (CUSTOM PROCEDURE TRAY) ×3 IMPLANT
PAD ARMBOARD 7.5X6 YLW CONV (MISCELLANEOUS) ×3 IMPLANT
SET BASIN LINEN APH (SET/KITS/TRAYS/PACK) ×3 IMPLANT
SPONGE GAUZE 2X2 8PLY STER LF (GAUZE/BANDAGES/DRESSINGS)
SPONGE GAUZE 2X2 8PLY STRL LF (GAUZE/BANDAGES/DRESSINGS) IMPLANT
SPONGE LAP 18X18 X RAY DECT (DISPOSABLE) ×3 IMPLANT
STOCKINETTE IMPERVIOUS LG (DRAPES) IMPLANT
STRIP CLOSURE SKIN 1/4X3 (GAUZE/BANDAGES/DRESSINGS) IMPLANT
SUT SILK 0 FSL (SUTURE) ×3 IMPLANT
SUT VIC AB 3-0 SH 27 (SUTURE) ×3
SUT VIC AB 3-0 SH 27X BRD (SUTURE) ×1 IMPLANT
SUT VIC AB 4-0 PS2 27 (SUTURE) ×6 IMPLANT
SYRINGE 10CC LL (SYRINGE) ×3 IMPLANT

## 2015-03-18 NOTE — Discharge Instructions (Signed)
Segmental Mastectomy and Axillary Lymph Node Dissection °Care After °These discharge instructions provide you with general information on caring for yourself after you leave the hospital. While your treatment has been planned according to the most current medical practices available, unavoidable complications occasionally occur. It is important that you know the warning signs of complications so that you can seek treatment. Please read the instructions outlined below and refer to this sheet in the next few weeks. Your doctor may also give you specific information. If you have any complications or questions after discharge, please call your doctor.  °ACTIVITY °· Your doctor will tell you when you may resume strenuous activities, driving and sports. After the drain(s) are removed, you may do light housework, but avoid heavy lifting, carrying or pushing (you should not be lifting anything heavier than 5 lbs.). °· Take frequent rest periods. You may tire more easily than usual. Always rest and elevate the arm affected by your surgery for a period of time equal to your activity time. °· Continue doing the exercises given to you by the physical therapist/occupational therapist even after full range of motion has returned. The amount of time this takes will vary from person to person. Generally, after normal range of motion has returned, some stiffness and soreness may persist for 2-3 months. This is normal and should subside. °· Begin sports or strenuous activities in moderation, giving you a chance to rebuild your endurance. °· Continue to be cautious of heavy lifting or carrying (not more than 10 lbs.) with the affected arm. You may return to work as recommended by your doctor. °NUTRITION °· You may resume your normal diet. °· Make sure you drink plenty of fluids (6-8 glasses per day). °· Eat a well-balanced diet. °· Daily portions of food from the grains, vegetables, fruits, milk, and meat and beans families are  necessary for your health. You may visit http://fnic.nal.usda.gov/ for more dietary information. °HYGIENE °· You may wash your hair. °· If your incision (the cut from surgery) is healed, you may shower or take a bath, unless instructed otherwise by your caregiver. °FEVER °If you feel feverish or have shaking chills, take your temperature. If your temperature is 102° F (38.9° C) or above, call your caregiver. The fever may mean there is an infection. If you call early, infection can be treated with antibiotics and hospitalization may be avoided. °PAIN CONTROL °· Mild discomfort may occur. You may need to take an "over-the-counter" pain medication or a medication prescribed by your doctor. °· Call your doctor if you experience increased pain. °INCISION CARE °Check your incision daily for increased redness, drainage, swelling or separation of skin edges. Call your doctor if any of the above are noted. °DRAIN CARE °If you have a drain in place, ask for instructions for "Surgical Drain Care". °ARM AND HAND CARE °· Since the lymph nodes under your arm have been removed, there may be a greater chance for the arm to swell. °· Avoid, if possible, having blood pressures taken, blood drawn or injections given in the affected arm. °· Use hand lotion to soften fingernail cuticles instead of cutting them to avoid cutting yourself. °· Use an electric shaver to shave your underarms. °· You may use a deodorant after the incision has completely healed. °· Be careful not to cut yourself when cooking, sewing or gardening. °· Do not weigh your arm straight down with a package or your purse. °Note: Follow the exercises and instructions given to you by the physical   therapist/occupational therapist and your treating doctor.  °Document Released: 07/26/2004 Document Revised: 03/05/2012 Document Reviewed: 03/04/2008 °ExitCare® Patient Information ©2015 [employer]. This information is not intended to replace advice given to you by your  health care provider. Make sure you discuss any questions you have with your health care provider. ° °

## 2015-03-18 NOTE — Anesthesia Procedure Notes (Signed)
Procedure Name: Intubation Date/Time: 03/18/2015 10:42 AM Performed by: Tressie Stalker E Pre-anesthesia Checklist: Patient identified, Patient being monitored, Timeout performed, Emergency Drugs available and Suction available Patient Re-evaluated:Patient Re-evaluated prior to inductionOxygen Delivery Method: Circle System Utilized Preoxygenation: Pre-oxygenation with 100% oxygen Intubation Type: IV induction Ventilation: Mask ventilation without difficulty Laryngoscope Size: Mac and 3 Grade View: Grade I Tube type: Oral Tube size: 7.0 mm Number of attempts: 1 Airway Equipment and Method: Stylet Placement Confirmation: ETT inserted through vocal cords under direct vision,  positive ETCO2 and breath sounds checked- equal and bilateral Secured at: 22 cm Tube secured with: Tape Dental Injury: Teeth and Oropharynx as per pre-operative assessment

## 2015-03-18 NOTE — Anesthesia Preprocedure Evaluation (Signed)
Anesthesia Evaluation  Patient identified by MRN, date of birth, ID band Patient awake    Reviewed: Allergy & Precautions, Patient's Chart, lab work & pertinent test results  History of Anesthesia Complications (+) PONV and history of anesthetic complications  Airway Mallampati: II       Dental  (+) Teeth Intact   Pulmonary sleep apnea and Continuous Positive Airway Pressure Ventilation ,    Pulmonary exam normal       Cardiovascular hypertension, Pt. on medications Rhythm:Regular Rate:Normal     Neuro/Psych PSYCHIATRIC DISORDERS Depression    GI/Hepatic GERD-  Medicated and Controlled,  Endo/Other    Renal/GU      Musculoskeletal   Abdominal   Peds  Hematology   Anesthesia Other Findings   Reproductive/Obstetrics                             Anesthesia Physical Anesthesia Plan  ASA: III  Anesthesia Plan:    Post-op Pain Management:    Induction: Intravenous  Airway Management Planned: Oral ETT  Additional Equipment:   Intra-op Plan:   Post-operative Plan: Extubation in OR  Informed Consent: I have reviewed the patients History and Physical, chart, labs and discussed the procedure including the risks, benefits and alternatives for the proposed anesthesia with the patient or authorized representative who has indicated his/her understanding and acceptance.     Plan Discussed with:   Anesthesia Plan Comments:         Anesthesia Quick Evaluation

## 2015-03-18 NOTE — Progress Notes (Signed)
Awake. Denies right breast and axilla pain. C/O back and neck discomfort. Wants to go home.

## 2015-03-18 NOTE — OR Nursing (Signed)
To mammo for needle loc

## 2015-03-18 NOTE — Transfer of Care (Signed)
Immediate Anesthesia Transfer of Care Note  Patient: Crystal [name]  Procedure(s) Performed: Procedure(s): PARTIAL MASTECTOMY AFTER NEEDLE LOCALIZATION AND AXILLARY SENTINEL LYMPH NODE BX (Right)  Patient Location: PACU  Anesthesia Type:General  Level of Consciousness: awake, alert  and oriented  Airway & Oxygen Therapy: Patient Spontanous Breathing and Patient connected to face mask oxygen  Post-op Assessment: Report given to RN  Post vital signs: Reviewed and stable  Last Vitals:  Filed Vitals:   03/18/15 1025  BP: 175/83  Pulse:   Temp:   Resp: 22    Complications: No apparent anesthesia complications

## 2015-03-18 NOTE — Interval H&P Note (Signed)
History and Physical Interval Note:  03/18/2015 10:03 AM  Crystal [name]  has presented today for surgery, with the diagnosis of right breast cancer  The various methods of treatment have been discussed with the patient and family. After consideration of risks, benefits and other options for treatment, the patient has consented to  Procedure(s): PARTIAL MASTECTOMY WITH NEEDLE LOCALIZATION AND AXILLARY SENTINEL LYMPH NODE BX (Right) as a surgical intervention .  The patient's history has been reviewed, patient examined, no change in status, stable for surgery.  I have reviewed the patient's chart and labs.  Questions were answered to the patient's satisfaction.     Aviva Signs A

## 2015-03-18 NOTE — OR Nursing (Signed)
[facility] med in to do sentinel node injection.Clotilde Dieter notified

## 2015-03-18 NOTE — Op Note (Signed)
Crystal [name] H Date of birth Mar 02, 1941 Date of surgery 03/18/2015   Preop Diagnosis:  Right breast carcinoma  Postop Diagnosis:  Same  Procedure:  Right partial mastectomy after needle localization, sentinel lymph node biopsy  Surgeon:  Aviva Signs, M.D.  Anes:  Gen. endotracheal  Indications:  Patient is a 74 year old white female who was recently found to have ductal carcinoma the right breast. After extensive discussion with the patient, she elected to proceed with a right partial mastectomy after needle localization, sentinel lymph node biopsy, possible axillary dissection. Risks and benefits of the procedures including bleeding, infection, nerve injury, and the possibility of the need for further excision due to unclear margins were fully explained to the patient, who gave informed consent.  Procedure note:  The patient was placed the supine position after undergoing needle localization in the radiology department as well as radioactive nuclide injection. After induction of general endotracheal anesthesia, methylene blue was injected into the site of the needle localization and massaged into the breast for 5 minutes. The right breast was then prepped and draped using usual sterile technique with ChloraPrep. Surgical site confirmation was performed.  Using the neoprobe, I dissection was performed in the right axilla. A blue lymph node was found with high counts. Any vessels were ligated using small clips. The blue lymph node was removed and had very high counts ex vivo. It was sent to pathology further examination. Basin counts were less than 10% of the in vivo count of the lymph node. Frozen section was negative for malignancy. The incision was injected with 0.5% Sensorcaine. The subcutaneous layer was reapproximated using a 3-0 Vicryl interrupted suture. The skin was closed using a 4-0 Vicryl subcuticular suture. Liquiband was applied at the end of both procedures.  Next, a right  partial mastectomy was performed. Using the guidewire, a curvilinear incision was made along the lateral aspect of the right breast. The dissection was taken down to the area of concern. The right breast tissue along with the wire were removed in total without difficulty. A short suture was placed superiorly and a long suture placed laterally for orientation purposes. The specimen was sent to radiology for specimen radiography. The previously placed clip and suspicious lesion were both in the specimen removed. The specimen was then sent to pathology further examination. Any bleeding was controlled using Bovie electrocautery. The wound was irrigated with normal saline. The skin was injected with 0.5% Sensorcaine. The skin was closed using a 4-0 Vicryl subcuticular suture.  Liquiband was applied.  All tape and needle counts were correct at the end of the procedures. The patient was extubated in the operating room and transferred to PACU in stable condition.  Complications:  None  EBL:  Minimal  Specimen:  Right sentinel lymph node biopsy, axilla Right breast tissue

## 2015-03-18 NOTE — Anesthesia Postprocedure Evaluation (Signed)
  Anesthesia Post-op Note  Patient: Crystal [name]  Procedure(s) Performed: Procedure(s): PARTIAL MASTECTOMY AFTER NEEDLE LOCALIZATION AND AXILLARY SENTINEL LYMPH NODE BX (Right)  Patient Location: PACU  Anesthesia Type:General  Level of Consciousness: awake and alert   Airway and Oxygen Therapy: Patient Spontanous Breathing and Patient connected to face mask oxygen  Post-op Pain: mild  Post-op Assessment: Post-op Vital signs reviewed, Patient's Cardiovascular Status Stable, Respiratory Function Stable, Patent Airway and No signs of Nausea or vomiting  Post-op Vital Signs: Reviewed and stable  Last Vitals:  Filed Vitals:   03/18/15 1025  BP: 175/83  Pulse:   Temp:   Resp: 22    Complications: No apparent anesthesia complications

## 2015-03-19 ENCOUNTER — Encounter (HOSPITAL_COMMUNITY): Payer: Self-pay | Admitting: General Surgery

## 2015-03-21 DIAGNOSIS — C50919 Malignant neoplasm of unspecified site of unspecified female breast: Secondary | ICD-10-CM

## 2015-03-21 HISTORY — DX: Malignant neoplasm of unspecified site of unspecified female breast: C50.919

## 2015-03-30 ENCOUNTER — Ambulatory Visit (HOSPITAL_COMMUNITY): Payer: Commercial Managed Care - HMO | Admitting: Hematology & Oncology

## 2015-03-31 ENCOUNTER — Other Ambulatory Visit (HOSPITAL_COMMUNITY): Payer: Self-pay | Admitting: *Deleted

## 2015-03-31 ENCOUNTER — Encounter (HOSPITAL_COMMUNITY): Payer: Commercial Managed Care - HMO | Attending: Hematology & Oncology | Admitting: Hematology & Oncology

## 2015-03-31 ENCOUNTER — Telehealth (HOSPITAL_COMMUNITY): Payer: Self-pay | Admitting: *Deleted

## 2015-03-31 ENCOUNTER — Encounter (HOSPITAL_COMMUNITY): Payer: Self-pay | Admitting: Hematology & Oncology

## 2015-03-31 VITALS — BP 129/50 | HR 66 | Temp 98.9°F | Resp 16 | Ht 64.25 in | Wt 235.6 lb

## 2015-03-31 DIAGNOSIS — Z170 Estrogen receptor positive status [ER+]: Secondary | ICD-10-CM

## 2015-03-31 DIAGNOSIS — C50411 Malignant neoplasm of upper-outer quadrant of right female breast: Secondary | ICD-10-CM

## 2015-03-31 DIAGNOSIS — I10 Essential (primary) hypertension: Secondary | ICD-10-CM

## 2015-03-31 DIAGNOSIS — C50919 Malignant neoplasm of unspecified site of unspecified female breast: Secondary | ICD-10-CM | POA: Insufficient documentation

## 2015-03-31 DIAGNOSIS — C50911 Malignant neoplasm of unspecified site of right female breast: Secondary | ICD-10-CM

## 2015-03-31 NOTE — Progress Notes (Signed)
[facility] CONSULT NOTE  Patient Care Team: Asencion Noble, MD as PCP - General (Internal Medicine) Daneil Dolin, MD (Gastroenterology)  CHIEF COMPLAINTS/PURPOSE OF CONSULTATION:  Right Partial mastectomy with needle localization and axillary sentinel node on 03/18/2015 with Dr. 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 female is here because of newly diagnosed invasive ductal carcinoma of the Right breast. Has done well since surgery with some mild drainage from the surgical site. She denies fever or chills. She denies any pain. He is here today for additional discussion of treatment. She states she is aware that she will need radiation therapy. She states Dr. Arnoldo Morale explained her surgical options very well.  She reports routine screening mammograms although she was a month late this last year. She denies a palpable breast mass prior to her mammogram.  MEDICAL HISTORY:  Past Medical History  Diagnosis Date  . 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 Diverticulosis   . Tubular adenoma of colon 08/2007  . HTN (hypertension)   . Depression   . GERD (gastroesophageal reflux disease)   . Chronic fatigue   . OA (osteoarthritis)   . Spinal stenosis   . Sleep apnea     cpap  . Obesity   . PONV (postoperative nausea and vomiting)   . Enlarged thyroid     SURGICAL HISTORY: Past Surgical History  Procedure Laterality Date  . Tubal ligation  1970    hysterectomy  . Abdominal hysterectomy      partial  . Breast cyst excision      benign, left x2, right x1  . Cataract extraction w/phaco  12/01/2011    Procedure: CATARACT EXTRACTION PHACO AND INTRAOCULAR LENS PLACEMENT (IOC);  Surgeon: Tonny Branch;  Location: [facility];   Service: Ophthalmology;  Laterality: Right;  CDE=14.87  . Cataract extraction w/phaco  02/06/2012    Procedure: CATARACT EXTRACTION PHACO AND INTRAOCULAR LENS PLACEMENT (IOC);  Surgeon: Tonny Branch, MD;  Location: [facility];  Service: Ophthalmology;  Laterality: Left;  CDE 13.00  . Colonoscopy  Sept 2008    Last colonoscopy/bx by Dr Rourk->(08/31/07) descending colon TUBULAR ADENOMA  . Colonoscopy  10/25/2012    Procedure: COLONOSCOPY;  Surgeon: Daneil Dolin, MD;  Location: [facility];  Service: Endoscopy;  Laterality: N/A;  10:15-changed to 10:30 Darius Bump notified  . Partial mastectomy with needle localization and axillary sentinel lymph node bx Right 03/18/2015    Procedure: PARTIAL MASTECTOMY AFTER NEEDLE LOCALIZATION AND AXILLARY SENTINEL LYMPH NODE BX;  Surgeon: Aviva Signs Md, MD;  Location: [facility];  Service: General;  Laterality: Right;    SOCIAL HISTORY: History   Social History  . Marital Status: Widowed    Spouse Name: N/A  . Number of Children: 2  . Years of Education: N/A   Occupational History  . Lab Tech-retired    Social History Main Topics  . Smoking status: Never Smoker   . Smokeless tobacco: Never Used  . Alcohol Use: No  . Drug Use: No  . Sexual Activity: No   Other Topics Concern  . Not on file   Social History Narrative   she has 3 children. She is widowed, her husband died from prostate cancer 6 years ago. They were  married for 49 years. She has 5 grandchildren. She was born here in [place]. She is a nonsmoker, never smoker. She does not drink alcohol. She enjoys reading, puzzles, and playing solitaire  FAMILY HISTORY: Family History  Problem Relation Age of Onset  . Cancer Brother 57  . Coronary artery disease Brother   . Diabetes Sister   . Anesthesia problems Neg Hx   . Hypotension Neg Hx   . Pseudochol deficiency Neg Hx   . Malignant hyperthermia Neg Hx   . Colon cancer Neg Hx    indicated that only one of her two brothers is alive.  2  brothers are deceased, one at the age of 48 from metastatic cancer of unknown primary. A second brother died from complications of heart disease. He was 77. She has one sister who is alive and has arthritis but is otherwise healthy. Her mother died at the age of 26 from age-related causes. Father died at 57 from complications of heart disease.   ALLERGIES:  has No Known Allergies.  MEDICATIONS:  Current Outpatient Prescriptions  Medication Sig Dispense Refill  . acetaminophen (TYLENOL ARTHRITIS PAIN) 650 MG CR tablet Take 1,300 mg by mouth 2 (two) times daily.     Marland Kitchen amLODipine (NORVASC) 10 MG tablet Take 10 mg by mouth daily.     Marland Kitchen aspirin EC 81 MG tablet Take 81 mg by mouth daily.      Marland Kitchen buPROPion (WELLBUTRIN SR) 150 MG 12 hr tablet Take 150 mg by mouth 2 (two) times daily.     . cycloSPORINE (RESTASIS) 0.05 % ophthalmic emulsion Place 1 drop into both eyes 2 (two) times daily as needed. For dry eyes    . FLUoxetine (PROZAC) 20 MG capsule Take 60 mg by mouth daily.     . fluticasone (FLONASE) 50 MCG/ACT nasal spray Place 1 spray into both nostrils daily.    Marland Kitchen HYDROMET 5-1.5 MG/5ML syrup Take 5 mLs by mouth 2 (two) times daily as needed for cough.     . loratadine (CLARITIN) 10 MG tablet Take 10 mg by mouth daily.      Marland Kitchen losartan-hydrochlorothiazide (HYZAAR) 100-25 MG per tablet Take 1 tablet by mouth daily.    Marland Kitchen omeprazole (PRILOSEC) 20 MG capsule Take 20 mg by mouth daily.     . simvastatin (ZOCOR) 10 MG tablet Take 10 mg by mouth daily.     No current facility-administered medications for this visit.    Review of Systems  Constitutional: Negative.   HENT: Negative.   Eyes: Negative.   Respiratory: Negative.   Cardiovascular: Negative.   Gastrointestinal: Negative.   Genitourinary: Negative.   Musculoskeletal: Positive for joint pain.  Skin: Negative.   Neurological: Negative.   Endo/Heme/Allergies: Negative.   Psychiatric/Behavioral: Negative.     PHYSICAL EXAMINATION: ECOG  PERFORMANCE STATUS: 0 - Asymptomatic  There were no vitals filed for this visit. There were no vitals filed for this visit.   Physical Exam  Constitutional: She is oriented to person, place, and time and well-developed, well-nourished, and in no distress.  Obese  HENT:  Head: Normocephalic and atraumatic.  Nose: Nose normal.  Mouth/Throat: Oropharynx is clear and moist. No oropharyngeal exudate.  Eyes: Conjunctivae and EOM are normal. Pupils are equal, round, and reactive to light. Right eye exhibits no discharge. Left eye exhibits no discharge. No scleral icterus.  Neck: Normal range of motion. Neck supple. No tracheal deviation present. No thyromegaly present.  Cardiovascular: Normal rate, regular rhythm and normal heart  sounds.  Exam reveals no gallop and no friction rub.   No murmur heard. Pulmonary/Chest: Effort normal and breath sounds normal. She has no wheezes. She has no rales.  Abdominal: Soft. Bowel sounds are normal. She exhibits no distension and no mass. There is no tenderness. There is no rebound and no guarding.  Musculoskeletal: Normal range of motion. She exhibits no edema.  Lymphadenopathy:    She has no cervical adenopathy.  Neurological: She is alert and oriented to person, place, and time. She has normal reflexes. No cranial nerve deficit. Gait normal. Coordination normal.  Skin: Skin is warm and dry. No rash noted.  Psychiatric: Mood, memory, affect and judgment normal.  Nursing note and vitals reviewed. Breast: R breast surgical site is C/D/I with mild serosanguinous drainage noted. No significant warmth or erythema   LABORATORY DATA:  I have reviewed the data as listed Lab Results  Component Value Date   WBC 3.9* 03/16/2015   HGB 14.0 03/16/2015   HCT 41.3 03/16/2015   MCV 94.9 03/16/2015   PLT 257 03/16/2015     Chemistry      Component Value Date/Time   NA 138 03/16/2015 1010   K 4.7 03/16/2015 1010   CL 103 03/16/2015 1010   CO2 24 03/16/2015  1010   BUN 14 03/16/2015 1010   CREATININE 0.97 03/16/2015 1010      Component Value Date/Time   CALCIUM 9.5 03/16/2015 1010   ALKPHOS 74 03/16/2015 1010   AST 40* 03/16/2015 1010   ALT 35 03/16/2015 1010   BILITOT 0.7 03/16/2015 1010       RADIOGRAPHIC STUDIES: I have personally reviewed the radiological images as listed and agreed with the findings in the report.  CLINICAL DATA: Screening.  EXAM: 01/21/2015 DIGITAL SCREENING BILATERAL MAMMOGRAM WITH CAD  COMPARISON: Previous exam(s) RECOMMENDATION: Diagnostic mammogram and possibly ultrasound of the right breast. (Code:FI-R-20M)  The patient will be contacted regarding the findings, and additional imaging will be scheduled.  BI-RADS CATEGORY 0: Incomplete. Need additional imaging evaluation and/or prior mammograms for comparison.   Electronically Signed  By: Lovey Newcomer M.D.  On: 01/21/2015 16:15  CLINICAL DATA: Patient recalled from screening for right breast mass.  EXAM: 02/11/2015 DIGITAL DIAGNOSTIC RIGHT MAMMOGRAM WITH CAD  ULTRASOUND RIGHT BREAST CLINICAL DATA: Biopsy proven invasive and in situ ductal carcinoma in the 10 o'clock region of the right breast.  LABS: None obtained at the time of imaging. IMPRESSION: Suspicious right breast mass.  RECOMMENDATION: Ultrasound-guided core needle biopsy right breast mass.  Scheduled for 02/19/2014 at 11 a.m.  I have discussed the findings and recommendations with the patient. Results were also provided in writing at the conclusion of the visit. If applicable, a reminder letter will be sent to the patient regarding the next appointment.  BI-RADS CATEGORY 4: Suspicious.   Electronically Signed  By: Lovey Newcomer M.D.  On: 02/11/2015 11:49   EXAM: 02/26/2015 BILATERAL BREAST MRI WITH AND WITHOUT CONTRAST IMPRESSION: 1.1 cm enhancing mass in the upper-outer quadrant of right breast corresponding with the biopsy proven invasive  and in situ ductal carcinoma.  RECOMMENDATION: Treatment planning of the known right breast cancer is recommended.  BI-RADS CATEGORY 6: Known biopsy-proven malignancy.   Electronically Signed  By: Lillia Mountain M.D.  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 Largest tumor dimension was 0.9 cm. We reviewed the types of breast cancer in detail today. We reviewed general treatment of estrogen receptor dependent breast cancers including surgical treatment, IV chemotherapy if needed, adjuvant radiation and adjuvant endocrine therapy.  In the size of her tumor and the histology of her tumor I feel that adjuvant IV chemotherapy will most likely not be needed however we did discuss sending the ONCOTYPE assay. I feel that is reasonable and we will regroup once it is available to review. Even if she is intermediate risk I would be hesitant to offer her IV therapy. I am going to refer her to radiation oncology for consultation. We will then see her back within the next 2 weeks to review the results of her ONCOTYPE. I advised her we would discuss the specifics of endocrine therapy at her next follow-up or after the completion of adjuvant radiation.  She was provided with reading information. The patient. her daughter and myself spent a significant amount of time reviewing the information. I answered all of their questions and advised them to write down any additional questions they may develop over the next week or 2 prior to next follow-up. They met with our social worker and patient navigator.  All questions were answered. The patient knows to call the clinic with any problems, questions or concerns.  This note was electronically signed.    Molli Hazard, MD  03/31/2015 7:39 AM

## 2015-03-31 NOTE — Patient Instructions (Signed)
[place] at [facility] Discharge Instructions  RECOMMENDATIONS MADE BY THE CONSULTANT AND ANY TEST RESULTS WILL BE SENT TO YOUR REFERRING PHYSICIAN.  Exam and discussion by Dr. Whitney Muse. Will have ONCOTYPE performed to determine your risk factors. Will make a referral to [facility] for Radiation consultation.  They will call you with the date and time of your appointment.  Follow-up in 2 weeks with Dr. Whitney Muse.  Thank you for choosing [place] at [facility] to provide your oncology and hematology care.  To afford each patient quality time with our provider, please arrive at least 15 minutes before your scheduled appointment time.    You need to re-schedule your appointment should you arrive 10 or more minutes late.  We strive to give you quality time with our providers, and arriving late affects you and other patients whose appointments are after yours.  Also, if you no show three or more times for appointments you may be dismissed from the clinic at the providers discretion.     Again, thank you for choosing [facility].  Our hope is that these requests will decrease the amount of time that you wait before being seen by our physicians.       _____________________________________________________________  Should you have questions after your visit to [facility], please contact our office at [phone] between the hours of 8:30 a.m. and 4:30 p.m.  Voicemails left after 4:30 p.m. will not be returned until the following business day.  For prescription refill requests, have your pharmacy contact our office.

## 2015-03-31 NOTE — Telephone Encounter (Signed)
Oncotype requisition submitted to [facility]. Alyse Low said they received it.

## 2015-04-01 ENCOUNTER — Encounter (HOSPITAL_COMMUNITY): Payer: Self-pay | Admitting: Lab

## 2015-04-01 NOTE — Progress Notes (Signed)
Referral sent to [facility].  Records faxed on 4/6

## 2015-04-08 ENCOUNTER — Encounter (HOSPITAL_COMMUNITY): Payer: Self-pay

## 2015-04-08 DIAGNOSIS — C50919 Malignant neoplasm of unspecified site of unspecified female breast: Secondary | ICD-10-CM | POA: Diagnosis not present

## 2015-04-10 DIAGNOSIS — Z7982 Long term (current) use of aspirin: Secondary | ICD-10-CM | POA: Diagnosis not present

## 2015-04-10 DIAGNOSIS — Z170 Estrogen receptor positive status [ER+]: Secondary | ICD-10-CM | POA: Diagnosis not present

## 2015-04-10 DIAGNOSIS — K219 Gastro-esophageal reflux disease without esophagitis: Secondary | ICD-10-CM | POA: Diagnosis not present

## 2015-04-10 DIAGNOSIS — F329 Major depressive disorder, single episode, unspecified: Secondary | ICD-10-CM | POA: Diagnosis not present

## 2015-04-10 DIAGNOSIS — F419 Anxiety disorder, unspecified: Secondary | ICD-10-CM | POA: Diagnosis not present

## 2015-04-10 DIAGNOSIS — I10 Essential (primary) hypertension: Secondary | ICD-10-CM | POA: Diagnosis not present

## 2015-04-10 DIAGNOSIS — Z79899 Other long term (current) drug therapy: Secondary | ICD-10-CM | POA: Diagnosis not present

## 2015-04-10 DIAGNOSIS — M1990 Unspecified osteoarthritis, unspecified site: Secondary | ICD-10-CM | POA: Diagnosis not present

## 2015-04-10 DIAGNOSIS — C50411 Malignant neoplasm of upper-outer quadrant of right female breast: Secondary | ICD-10-CM | POA: Diagnosis not present

## 2015-04-14 ENCOUNTER — Encounter (HOSPITAL_BASED_OUTPATIENT_CLINIC_OR_DEPARTMENT_OTHER): Payer: Commercial Managed Care - HMO | Admitting: Hematology & Oncology

## 2015-04-14 ENCOUNTER — Ambulatory Visit (HOSPITAL_COMMUNITY): Payer: Commercial Managed Care - HMO | Admitting: Hematology & Oncology

## 2015-04-14 VITALS — BP 139/94 | HR 95 | Temp 98.7°F | Resp 20 | Wt 241.6 lb

## 2015-04-14 DIAGNOSIS — C50411 Malignant neoplasm of upper-outer quadrant of right female breast: Secondary | ICD-10-CM

## 2015-04-14 DIAGNOSIS — C50919 Malignant neoplasm of unspecified site of unspecified female breast: Secondary | ICD-10-CM

## 2015-04-14 NOTE — Progress Notes (Signed)
[facility] CONSULT NOTE  Patient Care Team: Asencion Noble, MD as PCP - General (Internal Medicine) Daneil Dolin, MD (Gastroenterology)  CHIEF COMPLAINTS/PURPOSE OF CONSULTATION:  Right Partial mastectomy with needle localization and axillary sentinel node on 03/18/2015 with Dr. 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    HISTORY OF PRESENTING ILLNESS:  Crystal [name] 74 y.o. 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 She has done well from a surgical perspective. She denies fever or chills. She denies any pain. She is here today to review the results of her oncotype testing.  She reports routine screening mammograms although she was a month late this last year. She denies a palpable breast mass prior to her mammogram.  MEDICAL HISTORY:  Past Medical History  Diagnosis Date  . 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 Diverticulosis   . Tubular adenoma of colon 08/2007  . HTN (hypertension)   . Depression   . GERD (gastroesophageal reflux disease)   . Chronic fatigue   . OA (osteoarthritis)   . Spinal stenosis   . Sleep apnea     cpap  . Obesity   . PONV (postoperative nausea and vomiting)   . Enlarged thyroid   . Breast cancer 03/21/15    right    SURGICAL HISTORY: Past Surgical History  Procedure Laterality Date  . Tubal ligation  1970    hysterectomy  . Abdominal hysterectomy      partial  . Breast cyst excision      benign, left x2, right x1  . Cataract extraction w/phaco  12/01/2011    Procedure: CATARACT EXTRACTION PHACO AND INTRAOCULAR LENS PLACEMENT (IOC);  Surgeon: Tonny Branch;  Location: [facility];   Service: Ophthalmology;  Laterality: Right;  CDE=14.87  . Cataract extraction w/phaco  02/06/2012    Procedure: CATARACT EXTRACTION PHACO AND INTRAOCULAR LENS PLACEMENT (IOC);  Surgeon: Tonny Branch, MD;  Location: [facility];  Service: Ophthalmology;  Laterality: Left;  CDE 13.00  . Colonoscopy  Sept 2008    Last colonoscopy/bx by Dr Rourk->(08/31/07) descending colon TUBULAR ADENOMA  . Colonoscopy  10/25/2012    Procedure: COLONOSCOPY;  Surgeon: Daneil Dolin, MD;  Location: [facility];  Service: Endoscopy;  Laterality: N/A;  10:15-changed to 10:30 Darius Bump notified  . Partial mastectomy with needle localization and axillary sentinel lymph node bx Right 03/18/2015    Procedure: PARTIAL MASTECTOMY AFTER NEEDLE LOCALIZATION AND AXILLARY SENTINEL LYMPH NODE BX;  Surgeon: Aviva Signs Md, MD;  Location: [facility];  Service: General;  Laterality: Right;    SOCIAL HISTORY: History   Social History  . Marital Status: Widowed    Spouse Name: N/A  . Number of Children: 2  . Years of Education: N/A   Occupational History  . Lab Tech-retired    Social History Main Topics  . Smoking status: Never Smoker   . Smokeless tobacco: Never Used  . Alcohol Use: No  . Drug Use: No  . Sexual Activity: No   Other Topics Concern  . Not on file   Social History Narrative   she has 3 children. She is widowed, her husband  died from prostate cancer 6 years ago. They were married for 49 years. She has 5 grandchildren. She was born here in [place]. She is a nonsmoker, never smoker. She does not drink alcohol. She enjoys reading, puzzles, and playing solitaire  FAMILY HISTORY: Family History  Problem Relation Age of Onset  . Cancer Brother 41  . Coronary artery disease Brother   . Diabetes Sister   . Anesthesia problems Neg Hx   . Hypotension Neg Hx   . Pseudochol deficiency Neg Hx   . Malignant hyperthermia Neg Hx   . Colon cancer Neg Hx    indicated that only one of her two brothers is alive.  2  brothers are deceased, one at the age of 61 from metastatic cancer of unknown primary. A second brother died from complications of heart disease. He was 77. She has one sister who is alive and has arthritis but is otherwise healthy. Her mother died at the age of 64 from age-related causes. Father died at 80 from complications of heart disease.   ALLERGIES:  has No Known Allergies.  MEDICATIONS:  Current Outpatient Prescriptions  Medication Sig Dispense Refill  . acetaminophen (TYLENOL ARTHRITIS PAIN) 650 MG CR tablet Take 1,300 mg by mouth 2 (two) times daily.     Marland Kitchen amLODipine (NORVASC) 10 MG tablet Take 10 mg by mouth daily.     Marland Kitchen aspirin EC 81 MG tablet Take 81 mg by mouth daily.      Marland Kitchen buPROPion (WELLBUTRIN SR) 150 MG 12 hr tablet Take 150 mg by mouth 2 (two) times daily.     . cycloSPORINE (RESTASIS) 0.05 % ophthalmic emulsion Place 1 drop into both eyes 2 (two) times daily as needed. For dry eyes    . FLUoxetine (PROZAC) 20 MG capsule Take 60 mg by mouth daily.     . fluticasone (FLONASE) 50 MCG/ACT nasal spray Place 1 spray into both nostrils daily.    Marland Kitchen HYDROMET 5-1.5 MG/5ML syrup Take 5 mLs by mouth 2 (two) times daily as needed for cough.     . loratadine (CLARITIN) 10 MG tablet Take 10 mg by mouth daily.      Marland Kitchen losartan-hydrochlorothiazide (HYZAAR) 100-25 MG per tablet Take 1 tablet by mouth daily.    Marland Kitchen omeprazole (PRILOSEC) 20 MG capsule Take 20 mg by mouth daily.     . simvastatin (ZOCOR) 10 MG tablet Take 10 mg by mouth daily.     No current facility-administered medications for this visit.    Review of Systems  Constitutional: Negative for fever, chills, weight loss and malaise/fatigue.  HENT: Negative for congestion, hearing loss, nosebleeds, sore throat and tinnitus.   Eyes: Negative for blurred vision, double vision, pain and discharge.  Respiratory: Negative for cough, hemoptysis, sputum production, shortness of breath and wheezing.   Cardiovascular: Negative for chest  pain, palpitations, claudication, leg swelling and PND.  Gastrointestinal: Negative for heartburn, nausea, vomiting, abdominal pain, diarrhea, constipation, blood in stool and melena.  Genitourinary: Negative for dysuria, urgency, frequency and hematuria.  Musculoskeletal: Negative for myalgias, joint pain and falls.  Skin: Negative for itching and rash.  Neurological: Negative for dizziness, tingling, tremors, sensory change, speech change, focal weakness, seizures, loss of consciousness, weakness and headaches.  Endo/Heme/Allergies: Does not bruise/bleed easily.  Psychiatric/Behavioral: Negative for depression, suicidal ideas, memory loss and substance abuse. The patient is not nervous/anxious and does not have insomnia.     PHYSICAL EXAMINATION: ECOG PERFORMANCE STATUS: 0 - Asymptomatic  Filed Vitals:  04/14/15 1341  BP: 139/94  Pulse: 95  Temp: 98.7 F (37.1 C)  Resp: 20   Filed Weights   04/14/15 1341  Weight: 241 lb 9.6 oz (109.589 kg)     Physical Exam  Constitutional: She is oriented to person, place, and time and well-developed, well-nourished, and in no distress.  Obese  HENT:  Head: Normocephalic and atraumatic.  Nose: Nose normal.  Mouth/Throat: Oropharynx is clear and moist. No oropharyngeal exudate.  Eyes: Conjunctivae and EOM are normal. Pupils are equal, round, and reactive to light. Right eye exhibits no discharge. Left eye exhibits no discharge. No scleral icterus.  Neck: Normal range of motion. Neck supple. No tracheal deviation present. No thyromegaly present.  Cardiovascular: Normal rate, regular rhythm and normal heart sounds.  Exam reveals no gallop and no friction rub.   No murmur heard. Pulmonary/Chest: Effort normal and breath sounds normal. She has no wheezes. She has no rales.  Abdominal: Soft. Bowel sounds are normal. She exhibits no distension and no mass. There is no tenderness. There is no rebound and no guarding.  Musculoskeletal: Normal range  of motion. She exhibits no edema.  Lymphadenopathy:    She has no cervical adenopathy.  Neurological: She is alert and oriented to person, place, and time. She has normal reflexes. No cranial nerve deficit. Gait normal. Coordination normal.  Skin: Skin is warm and dry. No rash noted.  Psychiatric: Mood, memory, affect and judgment normal.  Nursing note and vitals reviewed.    LABORATORY DATA:  I have reviewed the data as listed Lab Results  Component Value Date   WBC 3.9* 03/16/2015   HGB 14.0 03/16/2015   HCT 41.3 03/16/2015   MCV 94.9 03/16/2015   PLT 257 03/16/2015     Chemistry      Component Value Date/Time   NA 138 03/16/2015 1010   K 4.7 03/16/2015 1010   CL 103 03/16/2015 1010   CO2 24 03/16/2015 1010   BUN 14 03/16/2015 1010   CREATININE 0.97 03/16/2015 1010      Component Value Date/Time   CALCIUM 9.5 03/16/2015 1010   ALKPHOS 74 03/16/2015 1010   AST 40* 03/16/2015 1010   ALT 35 03/16/2015 1010   BILITOT 0.7 03/16/2015 1010       RADIOGRAPHIC STUDIES: I have personally reviewed the radiological images as listed and agreed with the findings in the report.  CLINICAL DATA: Screening.  EXAM: 01/21/2015 DIGITAL SCREENING BILATERAL MAMMOGRAM WITH CAD  COMPARISON: Previous exam(s) RECOMMENDATION: Diagnostic mammogram and possibly ultrasound of the right breast. (Code:[id number]-11M)  The patient will be contacted regarding the findings, and additional imaging will be scheduled.  BI-RADS CATEGORY 0: Incomplete. Need additional imaging evaluation and/or prior mammograms for comparison.   Electronically Signed  By: Lovey Newcomer M.D.  On: 01/21/2015 16:15  CLINICAL DATA: Patient recalled from screening for right breast mass.  EXAM: 02/11/2015 DIGITAL DIAGNOSTIC RIGHT MAMMOGRAM WITH CAD  ULTRASOUND RIGHT BREAST CLINICAL DATA: Biopsy proven invasive and in situ ductal carcinoma in the 10 o'clock region of the right breast.  LABS: None  obtained at the time of imaging. IMPRESSION: Suspicious right breast mass.  RECOMMENDATION: Ultrasound-guided core needle biopsy right breast mass.  Scheduled for 02/19/2014 at 11 a.m.  I have discussed the findings and recommendations with the patient. Results were also provided in writing at the conclusion of the visit. If applicable, a reminder letter will be sent to the patient regarding the next appointment.  BI-RADS CATEGORY 4: Suspicious.  Electronically Signed  By: Lovey Newcomer M.D.  On: 02/11/2015 11:49   EXAM: 02/26/2015 BILATERAL BREAST MRI WITH AND WITHOUT CONTRAST IMPRESSION: 1.1 cm enhancing mass in the upper-outer quadrant of right breast corresponding with the biopsy proven invasive and in situ ductal carcinoma.  RECOMMENDATION: Treatment planning of the known right breast cancer is recommended.  BI-RADS CATEGORY 6: Known biopsy-proven malignancy.   Electronically Signed  By: Lillia Mountain M.D.  On: 03/02/2015 12:08  ASSESSMENT & PLAN:   Stage I, ER+. 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 Recurrence score of 4 with 10 year risk of distant recurrence 5% with Tamoxifen alone  Pleasant 74 year old female with a stage I ER positive, PR positive, and HER-2 negative carcinoma of the breast. Largest tumor dimension was 0.9 cm.  I discussed Oncotype DX test. I explained to the patient that this is a 21 gene panel to evaluate patient tumors DNA to calculate recurrence score. This would help determine whether patient has high risk or intermediate risk or low risk breast cancer. She understands that if her tumor was found to be high risk, she would benefit from systemic chemotherapy. If low risk, no need of chemotherapy. She was found to be low risk, therefore IV chemotherapy is not indicated.  She has been to X[facility] and will follow-up with them. I will bring her back in 8 weeks  to discuss endocrine therapy in detail. We will ensure she has had a cholesterol profile and DEXA as well.  All questions were answered. The patient knows to call the clinic with any problems, questions or concerns.  This note was electronically signed.    Molli Hazard, MD  04/14/2015 2:07 PM

## 2015-04-14 NOTE — Patient Instructions (Signed)
[place] [facility] Discharge Instructions  RECOMMENDATIONS MADE BY THE CONSULTANT AND ANY TEST RESULTS WILL BE SENT TO YOUR REFERRING PHYSICIAN.  Contact [facility] at [facility] as instructed. Return to this clinic in 8 weeks for follow up. Report any issues./concerns to clinic as needed prior to appointment.  Thank you for choosing [place] at [facility] to provide your oncology and hematology care.  To afford each patient quality time with our provider, please arrive at least 15 minutes before your scheduled appointment time.    You need to re-schedule your appointment should you arrive 10 or more minutes late.  We strive to give you quality time with our providers, and arriving late affects you and other patients whose appointments are after yours.  Also, if you no show three or more times for appointments you may be dismissed from the clinic at the providers discretion.     Again, thank you for choosing [facility].  Our hope is that these requests will decrease the amount of time that you wait before being seen by our physicians.       _____________________________________________________________  Should you have questions after your visit to [facility], please contact our office at [phone] between the hours of 8:30 a.m. and 4:30 p.m.  Voicemails left after 4:30 p.m. will not be returned until the following business day.  For prescription refill requests, have your pharmacy contact our office.

## 2015-04-17 DIAGNOSIS — Z170 Estrogen receptor positive status [ER+]: Secondary | ICD-10-CM | POA: Diagnosis not present

## 2015-04-17 DIAGNOSIS — C50411 Malignant neoplasm of upper-outer quadrant of right female breast: Secondary | ICD-10-CM | POA: Diagnosis not present

## 2015-04-17 DIAGNOSIS — Z79899 Other long term (current) drug therapy: Secondary | ICD-10-CM | POA: Diagnosis not present

## 2015-04-17 DIAGNOSIS — Z7982 Long term (current) use of aspirin: Secondary | ICD-10-CM | POA: Diagnosis not present

## 2015-04-17 DIAGNOSIS — K219 Gastro-esophageal reflux disease without esophagitis: Secondary | ICD-10-CM | POA: Diagnosis not present

## 2015-04-17 DIAGNOSIS — M1990 Unspecified osteoarthritis, unspecified site: Secondary | ICD-10-CM | POA: Diagnosis not present

## 2015-04-17 DIAGNOSIS — F329 Major depressive disorder, single episode, unspecified: Secondary | ICD-10-CM | POA: Diagnosis not present

## 2015-04-17 DIAGNOSIS — F419 Anxiety disorder, unspecified: Secondary | ICD-10-CM | POA: Diagnosis not present

## 2015-04-17 DIAGNOSIS — I10 Essential (primary) hypertension: Secondary | ICD-10-CM | POA: Diagnosis not present

## 2015-04-27 DIAGNOSIS — C50411 Malignant neoplasm of upper-outer quadrant of right female breast: Secondary | ICD-10-CM | POA: Diagnosis not present

## 2015-04-27 DIAGNOSIS — Z510 Encounter for antineoplastic radiation therapy: Secondary | ICD-10-CM | POA: Diagnosis not present

## 2015-04-29 DIAGNOSIS — C50411 Malignant neoplasm of upper-outer quadrant of right female breast: Secondary | ICD-10-CM | POA: Diagnosis not present

## 2015-04-29 DIAGNOSIS — Z510 Encounter for antineoplastic radiation therapy: Secondary | ICD-10-CM | POA: Diagnosis not present

## 2015-04-30 DIAGNOSIS — Z510 Encounter for antineoplastic radiation therapy: Secondary | ICD-10-CM | POA: Diagnosis not present

## 2015-04-30 DIAGNOSIS — C50411 Malignant neoplasm of upper-outer quadrant of right female breast: Secondary | ICD-10-CM | POA: Diagnosis not present

## 2015-05-01 DIAGNOSIS — Z510 Encounter for antineoplastic radiation therapy: Secondary | ICD-10-CM | POA: Diagnosis not present

## 2015-05-01 DIAGNOSIS — C50411 Malignant neoplasm of upper-outer quadrant of right female breast: Secondary | ICD-10-CM | POA: Diagnosis not present

## 2015-05-04 DIAGNOSIS — I10 Essential (primary) hypertension: Secondary | ICD-10-CM | POA: Diagnosis not present

## 2015-05-04 DIAGNOSIS — Z79899 Other long term (current) drug therapy: Secondary | ICD-10-CM | POA: Diagnosis not present

## 2015-05-04 DIAGNOSIS — Z510 Encounter for antineoplastic radiation therapy: Secondary | ICD-10-CM | POA: Diagnosis not present

## 2015-05-04 DIAGNOSIS — C50411 Malignant neoplasm of upper-outer quadrant of right female breast: Secondary | ICD-10-CM | POA: Diagnosis not present

## 2015-05-04 DIAGNOSIS — M1990 Unspecified osteoarthritis, unspecified site: Secondary | ICD-10-CM | POA: Diagnosis not present

## 2015-05-04 DIAGNOSIS — E049 Nontoxic goiter, unspecified: Secondary | ICD-10-CM | POA: Diagnosis not present

## 2015-05-04 DIAGNOSIS — E785 Hyperlipidemia, unspecified: Secondary | ICD-10-CM | POA: Diagnosis not present

## 2015-05-05 DIAGNOSIS — C50411 Malignant neoplasm of upper-outer quadrant of right female breast: Secondary | ICD-10-CM | POA: Diagnosis not present

## 2015-05-05 DIAGNOSIS — C50911 Malignant neoplasm of unspecified site of right female breast: Secondary | ICD-10-CM | POA: Diagnosis not present

## 2015-05-05 DIAGNOSIS — I10 Essential (primary) hypertension: Secondary | ICD-10-CM | POA: Diagnosis not present

## 2015-05-05 DIAGNOSIS — Z510 Encounter for antineoplastic radiation therapy: Secondary | ICD-10-CM | POA: Diagnosis not present

## 2015-05-05 DIAGNOSIS — E0590 Thyrotoxicosis, unspecified without thyrotoxic crisis or storm: Secondary | ICD-10-CM | POA: Diagnosis not present

## 2015-05-06 DIAGNOSIS — C50411 Malignant neoplasm of upper-outer quadrant of right female breast: Secondary | ICD-10-CM | POA: Diagnosis not present

## 2015-05-06 DIAGNOSIS — Z510 Encounter for antineoplastic radiation therapy: Secondary | ICD-10-CM | POA: Diagnosis not present

## 2015-05-07 DIAGNOSIS — C50411 Malignant neoplasm of upper-outer quadrant of right female breast: Secondary | ICD-10-CM | POA: Diagnosis not present

## 2015-05-07 DIAGNOSIS — Z510 Encounter for antineoplastic radiation therapy: Secondary | ICD-10-CM | POA: Diagnosis not present

## 2015-05-08 DIAGNOSIS — C50411 Malignant neoplasm of upper-outer quadrant of right female breast: Secondary | ICD-10-CM | POA: Diagnosis not present

## 2015-05-08 DIAGNOSIS — Z510 Encounter for antineoplastic radiation therapy: Secondary | ICD-10-CM | POA: Diagnosis not present

## 2015-05-09 ENCOUNTER — Encounter (HOSPITAL_COMMUNITY): Payer: Self-pay | Admitting: Hematology & Oncology

## 2015-05-11 DIAGNOSIS — Z510 Encounter for antineoplastic radiation therapy: Secondary | ICD-10-CM | POA: Diagnosis not present

## 2015-05-11 DIAGNOSIS — C50411 Malignant neoplasm of upper-outer quadrant of right female breast: Secondary | ICD-10-CM | POA: Diagnosis not present

## 2015-05-12 DIAGNOSIS — Z510 Encounter for antineoplastic radiation therapy: Secondary | ICD-10-CM | POA: Diagnosis not present

## 2015-05-12 DIAGNOSIS — C50411 Malignant neoplasm of upper-outer quadrant of right female breast: Secondary | ICD-10-CM | POA: Diagnosis not present

## 2015-05-13 DIAGNOSIS — Z510 Encounter for antineoplastic radiation therapy: Secondary | ICD-10-CM | POA: Diagnosis not present

## 2015-05-13 DIAGNOSIS — C50411 Malignant neoplasm of upper-outer quadrant of right female breast: Secondary | ICD-10-CM | POA: Diagnosis not present

## 2015-05-14 DIAGNOSIS — C50411 Malignant neoplasm of upper-outer quadrant of right female breast: Secondary | ICD-10-CM | POA: Diagnosis not present

## 2015-05-14 DIAGNOSIS — Z510 Encounter for antineoplastic radiation therapy: Secondary | ICD-10-CM | POA: Diagnosis not present

## 2015-05-15 DIAGNOSIS — C50411 Malignant neoplasm of upper-outer quadrant of right female breast: Secondary | ICD-10-CM | POA: Diagnosis not present

## 2015-05-15 DIAGNOSIS — Z510 Encounter for antineoplastic radiation therapy: Secondary | ICD-10-CM | POA: Diagnosis not present

## 2015-05-18 DIAGNOSIS — C50411 Malignant neoplasm of upper-outer quadrant of right female breast: Secondary | ICD-10-CM | POA: Diagnosis not present

## 2015-05-18 DIAGNOSIS — Z510 Encounter for antineoplastic radiation therapy: Secondary | ICD-10-CM | POA: Diagnosis not present

## 2015-05-19 DIAGNOSIS — Z510 Encounter for antineoplastic radiation therapy: Secondary | ICD-10-CM | POA: Diagnosis not present

## 2015-05-19 DIAGNOSIS — C50411 Malignant neoplasm of upper-outer quadrant of right female breast: Secondary | ICD-10-CM | POA: Diagnosis not present

## 2015-05-20 DIAGNOSIS — Z510 Encounter for antineoplastic radiation therapy: Secondary | ICD-10-CM | POA: Diagnosis not present

## 2015-05-20 DIAGNOSIS — C50411 Malignant neoplasm of upper-outer quadrant of right female breast: Secondary | ICD-10-CM | POA: Diagnosis not present

## 2015-05-26 DIAGNOSIS — C50411 Malignant neoplasm of upper-outer quadrant of right female breast: Secondary | ICD-10-CM | POA: Diagnosis not present

## 2015-05-26 DIAGNOSIS — Z510 Encounter for antineoplastic radiation therapy: Secondary | ICD-10-CM | POA: Diagnosis not present

## 2015-05-27 DIAGNOSIS — C50411 Malignant neoplasm of upper-outer quadrant of right female breast: Secondary | ICD-10-CM | POA: Diagnosis not present

## 2015-05-27 DIAGNOSIS — Z510 Encounter for antineoplastic radiation therapy: Secondary | ICD-10-CM | POA: Diagnosis not present

## 2015-05-28 DIAGNOSIS — Z510 Encounter for antineoplastic radiation therapy: Secondary | ICD-10-CM | POA: Diagnosis not present

## 2015-05-28 DIAGNOSIS — C50411 Malignant neoplasm of upper-outer quadrant of right female breast: Secondary | ICD-10-CM | POA: Diagnosis not present

## 2015-05-29 DIAGNOSIS — Z510 Encounter for antineoplastic radiation therapy: Secondary | ICD-10-CM | POA: Diagnosis not present

## 2015-05-29 DIAGNOSIS — C50411 Malignant neoplasm of upper-outer quadrant of right female breast: Secondary | ICD-10-CM | POA: Diagnosis not present

## 2015-06-01 DIAGNOSIS — C50411 Malignant neoplasm of upper-outer quadrant of right female breast: Secondary | ICD-10-CM | POA: Diagnosis not present

## 2015-06-01 DIAGNOSIS — Z510 Encounter for antineoplastic radiation therapy: Secondary | ICD-10-CM | POA: Diagnosis not present

## 2015-06-02 ENCOUNTER — Encounter (HOSPITAL_COMMUNITY): Payer: Self-pay | Admitting: Hematology & Oncology

## 2015-06-02 ENCOUNTER — Encounter (HOSPITAL_COMMUNITY): Payer: Commercial Managed Care - HMO | Attending: Hematology & Oncology | Admitting: Hematology & Oncology

## 2015-06-02 VITALS — BP 129/54 | HR 64 | Temp 97.9°F | Resp 22 | Wt 241.4 lb

## 2015-06-02 DIAGNOSIS — Z510 Encounter for antineoplastic radiation therapy: Secondary | ICD-10-CM | POA: Diagnosis not present

## 2015-06-02 DIAGNOSIS — C50919 Malignant neoplasm of unspecified site of unspecified female breast: Secondary | ICD-10-CM

## 2015-06-02 DIAGNOSIS — Z79899 Other long term (current) drug therapy: Secondary | ICD-10-CM

## 2015-06-02 DIAGNOSIS — C50911 Malignant neoplasm of unspecified site of right female breast: Secondary | ICD-10-CM | POA: Insufficient documentation

## 2015-06-02 DIAGNOSIS — Z170 Estrogen receptor positive status [ER+]: Secondary | ICD-10-CM | POA: Diagnosis not present

## 2015-06-02 DIAGNOSIS — C50411 Malignant neoplasm of upper-outer quadrant of right female breast: Secondary | ICD-10-CM

## 2015-06-02 NOTE — Patient Instructions (Signed)
[place] at [facility] Discharge Instructions  RECOMMENDATIONS MADE BY THE CONSULTANT AND ANY TEST RESULTS WILL BE SENT TO YOUR REFERRING PHYSICIAN.  Exam and discussion by Dr. Whitney Muse Report any new lumps, bone pain, shortness of breath or other symptoms.Report any new lumps, bone pain, shortness of breath or other symptoms. Call with any concerns. Follow-up in 3 weeks.  Thank you for choosing [place] at [facility] to provide your oncology and hematology care.  To afford each patient quality time with our provider, please arrive at least 15 minutes before your scheduled appointment time.    You need to re-schedule your appointment should you arrive 10 or more minutes late.  We strive to give you quality time with our providers, and arriving late affects you and other patients whose appointments are after yours.  Also, if you no show three or more times for appointments you may be dismissed from the clinic at the providers discretion.     Again, thank you for choosing [facility].  Our hope is that these requests will decrease the amount of time that you wait before being seen by our physicians.       _____________________________________________________________  Should you have questions after your visit to [facility], please contact our office at [phone] between the hours of 8:30 a.m. and 4:30 p.m.  Voicemails left after 4:30 p.m. will not be returned until the following business day.  For prescription refill requests, have your pharmacy contact our office.

## 2015-06-02 NOTE — Progress Notes (Signed)
[facility] Progress Note  Patient Care Team: Asencion Noble, MD as PCP - General (Internal Medicine) Daneil Dolin, MD (Gastroenterology)  CHIEF COMPLAINTS/PURPOSE OF CONSULTATION:  Right Partial mastectomy with needle localization and axillary sentinel node on 03/18/2015 with Dr. 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    HISTORY OF PRESENTING ILLNESS:  Crystal [name] 74 y.o. 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 She has done well from a surgical perspective. She denies fever or chills. She denies any pain.   She has 5 more radiation treatments to go. She is "pretty raw" but it isn't unbearable, she has things to help it. She sees Dr. Pablo Ledger.  She has trouble sleeping, however this is normal. She says the radiation burn doesn't bother her.  She is not currently taking calcium or Vitamin D.  Dr. Willey Blade told her to discontinue taking calcium and fish oil, this was a couple years ago.   MEDICAL HISTORY:  Past Medical History  Diagnosis Date  . 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 Diverticulosis   . Tubular adenoma of colon 08/2007  . HTN (hypertension)   . Depression   . GERD (gastroesophageal reflux disease)   . Chronic fatigue   . OA (osteoarthritis)   . Spinal stenosis   . Sleep apnea     cpap  . Obesity   . PONV (postoperative nausea and vomiting)   . Enlarged thyroid   . Breast cancer 03/21/15    right    SURGICAL HISTORY: Past Surgical History  Procedure Laterality Date  . Tubal ligation  1970    hysterectomy  . Abdominal hysterectomy      partial  . Breast cyst excision      benign,  left x2, right x1  . Cataract extraction w/phaco  12/01/2011    Procedure: CATARACT EXTRACTION PHACO AND INTRAOCULAR LENS PLACEMENT (IOC);  Surgeon: Tonny Branch;  Location: [facility];  Service: Ophthalmology;  Laterality: Right;  CDE=14.87  . Cataract extraction w/phaco  02/06/2012    Procedure: CATARACT EXTRACTION PHACO AND INTRAOCULAR LENS PLACEMENT (IOC);  Surgeon: Tonny Branch, MD;  Location: [facility];  Service: Ophthalmology;  Laterality: Left;  CDE 13.00  . Colonoscopy  Sept 2008    Last colonoscopy/bx by Dr Rourk->(08/31/07) descending colon TUBULAR ADENOMA  . Colonoscopy  10/25/2012    Procedure: COLONOSCOPY;  Surgeon: Daneil Dolin, MD;  Location: [facility];  Service: Endoscopy;  Laterality: N/A;  10:15-changed to 10:30 Darius Bump notified  . Partial mastectomy with needle localization and axillary sentinel lymph node bx Right 03/18/2015    Procedure: PARTIAL MASTECTOMY AFTER NEEDLE LOCALIZATION AND AXILLARY SENTINEL LYMPH NODE BX;  Surgeon: Aviva Signs Md, MD;  Location: [facility];  Service: General;  Laterality: Right;    SOCIAL HISTORY: History   Social History  . Marital Status: Widowed    Spouse Name: N/A  . Number of Children: 2  . Years of Education: N/A   Occupational History  . Lab Tech-retired    Social History Main Topics  . Smoking status: Never Smoker   . Smokeless tobacco: Never Used  . Alcohol Use:  No  . Drug Use: No  . Sexual Activity: No   Other Topics Concern  . Not on file   Social History Narrative   she has 3 children. She is widowed, her husband died from prostate cancer 6 years ago. They were married for 49 years. She has 5 grandchildren. She was born here in [place]. She is a nonsmoker, never smoker. She does not drink alcohol. She enjoys reading, puzzles, and playing solitaire  FAMILY HISTORY: Family History  Problem Relation Age of Onset  . Cancer Brother 36  . Coronary artery disease Brother   . Diabetes Sister   . Anesthesia problems Neg Hx     . Hypotension Neg Hx   . Pseudochol deficiency Neg Hx   . Malignant hyperthermia Neg Hx   . Colon cancer Neg Hx    indicated that only one of her two brothers is alive.  2 brothers are deceased, one at the age of 81 from metastatic cancer of unknown primary. A second brother died from complications of heart disease. He was 77. She has one sister who is alive and has arthritis but is otherwise healthy. Her mother died at the age of 81 from age-related causes. Father died at 37 from complications of heart disease.   ALLERGIES:  has No Known Allergies.  MEDICATIONS:  Current Outpatient Prescriptions  Medication Sig Dispense Refill  . acetaminophen (TYLENOL ARTHRITIS PAIN) 650 MG CR tablet Take 1,300 mg by mouth 2 (two) times daily.     Marland Kitchen amLODipine (NORVASC) 10 MG tablet Take 10 mg by mouth daily.     Marland Kitchen aspirin EC 81 MG tablet Take 81 mg by mouth daily.      Marland Kitchen buPROPion (WELLBUTRIN SR) 150 MG 12 hr tablet Take 150 mg by mouth 2 (two) times daily.     . cycloSPORINE (RESTASIS) 0.05 % ophthalmic emulsion Place 1 drop into both eyes 2 (two) times daily as needed. For dry eyes    . FLUoxetine (PROZAC) 20 MG capsule Take 60 mg by mouth daily.     . fluticasone (FLONASE) 50 MCG/ACT nasal spray Place 1 spray into both nostrils daily.    Marland Kitchen HYDROMET 5-1.5 MG/5ML syrup Take 5 mLs by mouth 2 (two) times daily as needed for cough.     . loratadine (CLARITIN) 10 MG tablet Take 10 mg by mouth daily.      Marland Kitchen losartan-hydrochlorothiazide (HYZAAR) 100-25 MG per tablet Take 1 tablet by mouth daily.    Marland Kitchen omeprazole (PRILOSEC) 20 MG capsule Take 20 mg by mouth daily.     . simvastatin (ZOCOR) 10 MG tablet Take 10 mg by mouth daily.     No current facility-administered medications for this visit.    Review of Systems  Constitutional: Negative for fever, chills, weight loss and malaise/fatigue.  HENT: Negative for congestion, hearing loss, nosebleeds, sore throat and tinnitus.   Eyes: Negative for blurred  vision, double vision, pain and discharge.  Respiratory: Negative for cough, hemoptysis, sputum production, shortness of breath and wheezing.   Cardiovascular: Negative for chest pain, palpitations, claudication, leg swelling and PND.  Gastrointestinal: Negative for heartburn, nausea, vomiting, abdominal pain, diarrhea, constipation, blood in stool and melena.  Genitourinary: Negative for dysuria, urgency, frequency and hematuria.  Musculoskeletal: Positive for joint pain. Negative for myalgias and falls.  Arthritis. Skin: Negative for itching and rash.  Neurological: Negative for dizziness, tingling, tremors, sensory change, speech change, focal weakness, seizures, loss of consciousness, weakness and headaches.  Endo/Heme/Allergies: Does  not bruise/bleed easily.  Psychiatric/Behavioral: Negative for depression, suicidal ideas, memory loss and substance abuse. The patient is not nervous/anxious and does not have insomnia.     PHYSICAL EXAMINATION:  ECOG PERFORMANCE STATUS: 0 - Asymptomatic  There were no vitals filed for this visit. There were no vitals filed for this visit.   Physical Exam  Constitutional: She is oriented to person, place, and time and well-developed, well-nourished, and in no distress.  Obese  HENT:  Head: Normocephalic and atraumatic.  Nose: Nose normal.  Mouth/Throat: Oropharynx is clear and moist. No oropharyngeal exudate.  Eyes: Conjunctivae and EOM are normal. Pupils are equal, round, and reactive to light. Right eye exhibits no discharge. Left eye exhibits no discharge. No scleral icterus.  Neck: Normal range of motion. Neck supple. No tracheal deviation present. No thyromegaly present.  Cardiovascular: Normal rate, regular rhythm and normal heart sounds.  Exam reveals no gallop and no friction rub.   No murmur heard. Pulmonary/Chest: Effort normal and breath sounds normal. She has no wheezes. She has no rales.  Breast: Radiation changes present, mostly  redness. Under the right axillae, her excision site is at the 9 o'clock it is erythematous. Abdominal: Soft. Bowel sounds are normal. She exhibits no distension and no mass. There is no tenderness. There is no rebound and no guarding.  Musculoskeletal: Normal range of motion. She exhibits no edema.  Lymphadenopathy:    She has no cervical adenopathy.  Neurological: She is alert and oriented to person, place, and time. She has normal reflexes. No cranial nerve deficit. Gait normal. Coordination normal.  Skin: Skin is warm and dry. No rash noted.  Psychiatric: Mood, memory, affect and judgment normal.  Nursing note and vitals reviewed.   LABORATORY DATA:  I have reviewed the data as listed Lab Results  Component Value Date   WBC 3.9* 03/16/2015   HGB 14.0 03/16/2015   HCT 41.3 03/16/2015   MCV 94.9 03/16/2015   PLT 257 03/16/2015     Chemistry      Component Value Date/Time   NA 138 03/16/2015 1010   K 4.7 03/16/2015 1010   CL 103 03/16/2015 1010   CO2 24 03/16/2015 1010   BUN 14 03/16/2015 1010   CREATININE 0.97 03/16/2015 1010      Component Value Date/Time   CALCIUM 9.5 03/16/2015 1010   ALKPHOS 74 03/16/2015 1010   AST 40* 03/16/2015 1010   ALT 35 03/16/2015 1010   BILITOT 0.7 03/16/2015 1010       RADIOGRAPHIC STUDIES: I have personally reviewed the radiological images as listed and agreed with the findings in the report.  CLINICAL DATA: Screening.  EXAM: 01/21/2015 DIGITAL SCREENING BILATERAL MAMMOGRAM WITH CAD  COMPARISON: Previous exam(s) RECOMMENDATION: Diagnostic mammogram and possibly ultrasound of the right breast. (Code:[id number]-41M)  The patient will be contacted regarding the findings, and additional imaging will be scheduled.  BI-RADS CATEGORY 0: Incomplete. Need additional imaging evaluation and/or prior mammograms for comparison.   Electronically Signed  By: Lovey Newcomer M.D.  On: 01/21/2015 16:15  CLINICAL DATA: Patient recalled  from screening for right breast mass.  EXAM: 02/11/2015 DIGITAL DIAGNOSTIC RIGHT MAMMOGRAM WITH CAD  ULTRASOUND RIGHT BREAST CLINICAL DATA: Biopsy proven invasive and in situ ductal carcinoma in the 10 o'clock region of the right breast.  LABS: None obtained at the time of imaging. IMPRESSION: Suspicious right breast mass.  RECOMMENDATION: Ultrasound-guided core needle biopsy right breast mass.  Scheduled for 02/19/2014 at 11 a.m.  I have discussed the  findings and recommendations with the patient. Results were also provided in writing at the conclusion of the visit. If applicable, a reminder letter will be sent to the patient regarding the next appointment.  BI-RADS CATEGORY 4: Suspicious.   Electronically Signed  By: Lovey Newcomer M.D.  On: 02/11/2015 11:49   EXAM: 02/26/2015 BILATERAL BREAST MRI WITH AND WITHOUT CONTRAST IMPRESSION: 1.1 cm enhancing mass in the upper-outer quadrant of right breast corresponding with the biopsy proven invasive and in situ ductal carcinoma.  RECOMMENDATION: Treatment planning of the known right breast cancer is recommended.  BI-RADS CATEGORY 6: Known biopsy-proven malignancy.   Electronically Signed  By: Lillia Mountain M.D.  On: 03/02/2015 12:08  ASSESSMENT & PLAN:   Stage I, ER+. 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 Recurrence score of 4 with 10 year risk of distant recurrence 5% with Tamoxifen alone  Pleasant 74 year old female with a stage I ER positive, PR positive, and HER-2 negative carcinoma of the breast. Largest tumor dimension was 0.9 cm.  She will be completing radiation therapy soon. We spent today in discussion of endocrine therapy. She is a candidate for an aromatase inhibitor. She has been given reading information on this class of medications. I briefly addressed side effects and benefits of endocrine therapy. I advised  her we will review these at follow-up. She will need a bone density. We will arrange this prior to her follow-up visit and review it when she returns.   All questions were answered. The patient knows to call the clinic with any problems, questions or concerns.  This note was electronically signed.    This document serves as a record of services personally performed by Ancil Linsey, MD. It was created on her behalf by Arlyce Harman, a trained medical scribe. The creation of this record is based on the scribe's personal observations and the provider's statements to them. This document has been checked and approved by the attending provider.  I have reviewed the above documentation for accuracy and completeness, and I agree with the above. Molli Hazard, MD

## 2015-06-03 ENCOUNTER — Encounter (HOSPITAL_COMMUNITY): Payer: Self-pay | Admitting: Hematology & Oncology

## 2015-06-03 DIAGNOSIS — C50411 Malignant neoplasm of upper-outer quadrant of right female breast: Secondary | ICD-10-CM | POA: Diagnosis not present

## 2015-06-03 DIAGNOSIS — Z510 Encounter for antineoplastic radiation therapy: Secondary | ICD-10-CM | POA: Diagnosis not present

## 2015-06-04 DIAGNOSIS — Z510 Encounter for antineoplastic radiation therapy: Secondary | ICD-10-CM | POA: Diagnosis not present

## 2015-06-04 DIAGNOSIS — C50411 Malignant neoplasm of upper-outer quadrant of right female breast: Secondary | ICD-10-CM | POA: Diagnosis not present

## 2015-06-05 DIAGNOSIS — C50411 Malignant neoplasm of upper-outer quadrant of right female breast: Secondary | ICD-10-CM | POA: Diagnosis not present

## 2015-06-05 DIAGNOSIS — Z510 Encounter for antineoplastic radiation therapy: Secondary | ICD-10-CM | POA: Diagnosis not present

## 2015-06-08 ENCOUNTER — Ambulatory Visit (HOSPITAL_COMMUNITY)
Admission: RE | Admit: 2015-06-08 | Discharge: 2015-06-08 | Disposition: A | Discharge status: Home / Self Care | Payer: Commercial Managed Care - HMO | Source: Ambulatory Visit | Attending: Hematology & Oncology | Admitting: Hematology & Oncology

## 2015-06-08 DIAGNOSIS — C50919 Malignant neoplasm of unspecified site of unspecified female breast: Secondary | ICD-10-CM

## 2015-06-08 DIAGNOSIS — M85851 Other specified disorders of bone density and structure, right thigh: Secondary | ICD-10-CM | POA: Diagnosis not present

## 2015-06-08 DIAGNOSIS — Z780 Asymptomatic menopausal state: Secondary | ICD-10-CM | POA: Diagnosis not present

## 2015-06-08 DIAGNOSIS — C50411 Malignant neoplasm of upper-outer quadrant of right female breast: Secondary | ICD-10-CM | POA: Diagnosis not present

## 2015-06-08 DIAGNOSIS — Z510 Encounter for antineoplastic radiation therapy: Secondary | ICD-10-CM | POA: Diagnosis not present

## 2015-06-08 DIAGNOSIS — Z79899 Other long term (current) drug therapy: Secondary | ICD-10-CM

## 2015-06-24 ENCOUNTER — Encounter (HOSPITAL_BASED_OUTPATIENT_CLINIC_OR_DEPARTMENT_OTHER): Payer: Commercial Managed Care - HMO | Admitting: Hematology & Oncology

## 2015-06-24 ENCOUNTER — Encounter (HOSPITAL_BASED_OUTPATIENT_CLINIC_OR_DEPARTMENT_OTHER): Payer: Commercial Managed Care - HMO

## 2015-06-24 ENCOUNTER — Encounter (HOSPITAL_COMMUNITY): Payer: Self-pay | Admitting: Hematology & Oncology

## 2015-06-24 VITALS — BP 143/53 | HR 78 | Temp 98.2°F | Resp 18 | Wt 240.9 lb

## 2015-06-24 DIAGNOSIS — C50919 Malignant neoplasm of unspecified site of unspecified female breast: Secondary | ICD-10-CM | POA: Diagnosis not present

## 2015-06-24 DIAGNOSIS — C50911 Malignant neoplasm of unspecified site of right female breast: Secondary | ICD-10-CM | POA: Diagnosis not present

## 2015-06-24 DIAGNOSIS — Z79899 Other long term (current) drug therapy: Secondary | ICD-10-CM | POA: Diagnosis not present

## 2015-06-24 DIAGNOSIS — M8580 Other specified disorders of bone density and structure, unspecified site: Secondary | ICD-10-CM | POA: Diagnosis not present

## 2015-06-24 LAB — COMPREHENSIVE METABOLIC PANEL
ALK PHOS: 72 U/L (ref 38–126)
ALT: 22 U/L (ref 14–54)
AST: 27 U/L (ref 15–41)
Albumin: 4.3 g/dL (ref 3.5–5.0)
Anion gap: 8 (ref 5–15)
BILIRUBIN TOTAL: 0.7 mg/dL (ref 0.3–1.2)
BUN: 13 mg/dL (ref 6–20)
CHLORIDE: 104 mmol/L (ref 101–111)
CO2: 27 mmol/L (ref 22–32)
CREATININE: 0.75 mg/dL (ref 0.44–1.00)
Calcium: 9 mg/dL (ref 8.9–10.3)
GLUCOSE: 95 mg/dL (ref 65–99)
POTASSIUM: 4 mmol/L (ref 3.5–5.1)
Sodium: 139 mmol/L (ref 135–145)
Total Protein: 7.3 g/dL (ref 6.5–8.1)

## 2015-06-24 LAB — CBC WITH DIFFERENTIAL/PLATELET
BASOS ABS: 0 10*3/uL (ref 0.0–0.1)
Basophils Relative: 1 % (ref 0–1)
EOS PCT: 6 % — AB (ref 0–5)
Eosinophils Absolute: 0.2 10*3/uL (ref 0.0–0.7)
HCT: 39.2 % (ref 36.0–46.0)
Hemoglobin: 13.1 g/dL (ref 12.0–15.0)
Lymphocytes Relative: 28 % (ref 12–46)
Lymphs Abs: 1.1 10*3/uL (ref 0.7–4.0)
MCH: 32.2 pg (ref 26.0–34.0)
MCHC: 33.4 g/dL (ref 30.0–36.0)
MCV: 96.3 fL (ref 78.0–100.0)
Monocytes Absolute: 0.5 10*3/uL (ref 0.1–1.0)
Monocytes Relative: 12 % (ref 3–12)
Neutro Abs: 2.1 10*3/uL (ref 1.7–7.7)
Neutrophils Relative %: 53 % (ref 43–77)
PLATELETS: 185 10*3/uL (ref 150–400)
RBC: 4.07 MIL/uL (ref 3.87–5.11)
RDW: 13.4 % (ref 11.5–15.5)
WBC: 3.9 10*3/uL — ABNORMAL LOW (ref 4.0–10.5)

## 2015-06-24 MED ORDER — ANASTROZOLE 1 MG PO TABS: 1.0000 mg | ORAL_TABLET | Freq: Every day | ORAL | Status: DC

## 2015-06-24 NOTE — Progress Notes (Signed)
[place] Progress Note  Patient Care Team: Asencion Noble, MD as PCP - General (Internal Medicine) Daneil Dolin, MD (Gastroenterology)  CHIEF COMPLAINTS/PURPOSE OF CONSULTATION:  Right Partial mastectomy with needle localization and axillary sentinel node on 03/18/2015 with Dr. 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   05/04/2015 - 06/08/2015 Radiation Therapy Dr. Pablo Ledger    HISTORY OF PRESENTING ILLNESS:  Crystal [name] 74 y.o. 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 She has done well from a surgical perspective. She denies fever or chills. She denies any pain. She has completed XRT.  The patient is alone today and presents to review her bone density test and to discuss endocrine therapy. She has recently received breast radiation.  She describes her breast as previously being "raw" from the experience. She notes she is now healing well The patient visits the dentist regularly and gets treated for gum recession. She previously took calcium and Vit. D a couple of years ago but stopped by the advisement of Dr. Willey Blade   MEDICAL HISTORY:  Past Medical History  Diagnosis Date  . 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 Diverticulosis   . Tubular adenoma of colon 08/2007  . HTN (hypertension)   . Depression   . GERD (gastroesophageal reflux disease)   . Chronic fatigue   . OA (osteoarthritis)   . Spinal stenosis   . Sleep apnea     cpap  . Obesity   . PONV (postoperative nausea and vomiting)   . Enlarged thyroid   . Breast cancer 03/21/15    right    SURGICAL HISTORY: Past Surgical History  Procedure  Laterality Date  . Tubal ligation  1970    hysterectomy  . Abdominal hysterectomy      partial  . Breast cyst excision      benign, left x2, right x1  . Cataract extraction w/phaco  12/01/2011    Procedure: CATARACT EXTRACTION PHACO AND INTRAOCULAR LENS PLACEMENT (IOC);  Surgeon: Tonny Branch;  Location: [facility];  Service: Ophthalmology;  Laterality: Right;  CDE=14.87  . Cataract extraction w/phaco  02/06/2012    Procedure: CATARACT EXTRACTION PHACO AND INTRAOCULAR LENS PLACEMENT (IOC);  Surgeon: Tonny Branch, MD;  Location: [facility];  Service: Ophthalmology;  Laterality: Left;  CDE 13.00  . Colonoscopy  Sept 2008    Last colonoscopy/bx by Dr Rourk->(08/31/07) descending colon TUBULAR ADENOMA  . Colonoscopy  10/25/2012    Procedure: COLONOSCOPY;  Surgeon: Daneil Dolin, MD;  Location: [facility];  Service: Endoscopy;  Laterality: N/A;  10:15-changed to 10:30 Darius Bump notified  . Partial mastectomy with needle localization and axillary sentinel lymph node bx Right 03/18/2015    Procedure: PARTIAL MASTECTOMY AFTER NEEDLE LOCALIZATION AND AXILLARY SENTINEL LYMPH NODE BX;  Surgeon: Aviva Signs Md, MD;  Location: [facility];  Service: General;  Laterality: Right;    SOCIAL HISTORY: History   Social History  . Marital Status: Widowed    Spouse Name: N/A  . Number of Children: 2  . Years of Education: N/A   Occupational History  . Lab Tech-retired    Social History Main Topics  .  Smoking status: Never Smoker   . Smokeless tobacco: Never Used  . Alcohol Use: No  . Drug Use: No  . Sexual Activity: No   Other Topics Concern  . Not on file   Social History Narrative   she has 3 children. She is widowed, her husband died from prostate cancer 6 years ago. They were married for 49 years. She has 5 grandchildren. She was born here in [place]. She is a nonsmoker, never smoker. She does not drink alcohol. She enjoys reading, puzzles, and playing solitaire  FAMILY HISTORY: Family History    Problem Relation Age of Onset  . Cancer Brother 30  . Coronary artery disease Brother   . Diabetes Sister   . Anesthesia problems Neg Hx   . Hypotension Neg Hx   . Pseudochol deficiency Neg Hx   . Malignant hyperthermia Neg Hx   . Colon cancer Neg Hx    indicated that only one of her two brothers is alive.  2 brothers are deceased, one at the age of 34 from metastatic cancer of unknown primary. A second brother died from complications of heart disease. He was 77. She has one sister who is alive and has arthritis but is otherwise healthy. Her mother died at the age of 80 from age-related causes. Father died at 12 from complications of heart disease.   ALLERGIES:  has No Known Allergies.  MEDICATIONS:  Current Outpatient Prescriptions  Medication Sig Dispense Refill  . acetaminophen (TYLENOL ARTHRITIS PAIN) 650 MG CR tablet Take 1,300 mg by mouth 2 (two) times daily.     Marland Kitchen amLODipine (NORVASC) 10 MG tablet Take 10 mg by mouth daily.     Marland Kitchen aspirin EC 81 MG tablet Take 81 mg by mouth daily.      Marland Kitchen buPROPion (WELLBUTRIN SR) 150 MG 12 hr tablet Take 150 mg by mouth 2 (two) times daily.     . cycloSPORINE (RESTASIS) 0.05 % ophthalmic emulsion Place 1 drop into both eyes 2 (two) times daily as needed. For dry eyes    . FLUoxetine (PROZAC) 20 MG capsule Take 60 mg by mouth daily.     . fluticasone (FLONASE) 50 MCG/ACT nasal spray Place 1 spray into both nostrils daily.    Marland Kitchen HYDROMET 5-1.5 MG/5ML syrup Take 5 mLs by mouth 2 (two) times daily as needed for cough.     . loratadine (CLARITIN) 10 MG tablet Take 10 mg by mouth daily.      Marland Kitchen losartan-hydrochlorothiazide (HYZAAR) 100-25 MG per tablet Take 1 tablet by mouth daily.    Marland Kitchen omeprazole (PRILOSEC) 20 MG capsule Take 20 mg by mouth daily.     . simvastatin (ZOCOR) 10 MG tablet Take 10 mg by mouth daily.    Marland Kitchen anastrozole (ARIMIDEX) 1 MG tablet Take 1 tablet (1 mg total) by mouth daily. 30 tablet 2   No current facility-administered  medications for this visit.    Review of Systems  Constitutional: Negative for fever, chills, weight loss and malaise/fatigue.  HENT: Negative for congestion, hearing loss, nosebleeds, sore throat and tinnitus.   Eyes: Negative for blurred vision, double vision, pain and discharge.  Respiratory: Negative for cough, hemoptysis, sputum production, shortness of breath and wheezing.   Cardiovascular: Negative for chest pain, palpitations, claudication, leg swelling and PND.  Gastrointestinal: Negative for heartburn, nausea, vomiting, abdominal pain, diarrhea, constipation, blood in stool and melena.  Genitourinary: Negative for dysuria, urgency, frequency and hematuria.  Musculoskeletal: Positive for joint pain. Negative for  myalgias and falls.  Arthritis. Skin: Negative for itching and rash.  Neurological: Negative for dizziness, tingling, tremors, sensory change, speech change, focal weakness, seizures, loss of consciousness, weakness and headaches.  Endo/Heme/Allergies: Does not bruise/bleed easily.  Psychiatric/Behavioral: Negative for depression, suicidal ideas, memory loss and substance abuse. The patient is not nervous/anxious and does not have insomnia.    PHYSICAL EXAMINATION:  ECOG PERFORMANCE STATUS: 0 - Asymptomatic  Filed Vitals:   06/24/15 0910  BP: 143/53  Pulse: 78  Temp: 98.2 F (36.8 C)  Resp: 18   Filed Weights   06/24/15 0910  Weight: 240 lb 14.4 oz (109.272 kg)     Physical Exam  Constitutional: She is oriented to person, place, and time and well-developed, well-nourished, and in no distress.  Obese  HENT:  Head: Normocephalic and atraumatic.  Nose: Nose normal.  Mouth/Throat: Oropharynx is clear and moist. No oropharyngeal exudate.  Eyes: Conjunctivae and EOM are normal. Pupils are equal, round, and reactive to light. Right eye exhibits no discharge. Left eye exhibits no discharge. No scleral icterus.  Neck: Normal range of motion. Neck supple. No  tracheal deviation present. No thyromegaly present.  Cardiovascular: Normal rate, regular rhythm and normal heart sounds.  Exam reveals no gallop and no friction rub.   No murmur heard. Pulmonary/Chest: Effort normal and breath sounds normal. She has no wheezes. She has no rales.   Breast: Radiation changes present in R breast with mild erythema, minimal peeling, incision site is at the 9 o'clock position and is well healed Abdominal: Soft. Bowel sounds are normal. She exhibits no distension and no mass. There is no tenderness. There is no rebound and no guarding.  Musculoskeletal: Normal range of motion. She exhibits no edema.  Lymphadenopathy:    She has no cervical adenopathy.  Neurological: She is alert and oriented to person, place, and time. She has normal reflexes. No cranial nerve deficit. Gait normal. Coordination normal.  Skin: Skin is warm and dry. No rash noted.  Psychiatric: Mood, memory, affect and judgment normal.  Nursing note and vitals reviewed.   LABORATORY DATA:  I have reviewed the data as listed Lab Results  Component Value Date   WBC 3.9* 06/24/2015   HGB 13.1 06/24/2015   HCT 39.2 06/24/2015   MCV 96.3 06/24/2015   PLT 185 06/24/2015     Chemistry      Component Value Date/Time   NA 139 06/24/2015 0928   K 4.0 06/24/2015 0928   CL 104 06/24/2015 0928   CO2 27 06/24/2015 0928   BUN 13 06/24/2015 0928   CREATININE 0.75 06/24/2015 0928      Component Value Date/Time   CALCIUM 9.0 06/24/2015 0928   ALKPHOS 72 06/24/2015 0928   AST 27 06/24/2015 0928   ALT 22 06/24/2015 0928   BILITOT 0.7 06/24/2015 0928       RADIOGRAPHIC STUDIES: I have personally reviewed the radiological images as listed and agreed with the findings in the report.  EXAM: DUAL X-RAY ABSORPTIOMETRY (DXA) FOR BONE MINERAL DENSITY  IMPRESSION: Ordering Physician: Dr. Patrici Ranks, ASSESSMENT: BMD as determined from Femur Neck Right is 0.843 g/cm2 with a T-Score of -1.4.  This patient is considered osteopenic according to [facility]) criteria. Per official position of the ISCD, it is not possible to quantitatively compare BMD or calculate a LSC between different facilities. (Lumbar spine was not utilized due to advanced degenerative changes.) See FRAX report on next page.  ASSESSMENT & PLAN:  Stage I, ER+. 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 Recurrence score of 4 with 10 year risk of distant recurrence 5% with Tamoxifen alone Osteopenia  Pleasant 74 year old female with a stage I ER positive, PR positive, and HER-2 negative carcinoma of the breast. Largest tumor dimension was 0.9 cm.  She has completed XRT. We spent today in discussion of endocrine therapy. She is a candidate for an aromatase inhibitor. She has been given reading information on this class of medications. I briefly addressed side effects and benefits of endocrine therapy. She was called in a prescription for arimidex 1 mg daily.  We reviewed her DEXA scan. I offered her prolia or a bisphosphonate given data in regards to osteopenia and AI use. She is interested in pursuing prolia. She has excellent dental care. She was also instructed to begin calcium at least 600 mg daily, and vitamin D 1000 IU daily. Risks and benefits of prolia were discussed including risk of osteonecrosis of the jaw.   All questions were answered. The patient knows to call the clinic with any problems, questions or concerns.   Follow up in 4 weeks  This note was electronically signed.    This document serves as a record of services personally performed by Ancil Linsey, MD. It was created on her behalf by Arlyce Harman, a trained medical scribe. The creation of this record is based on the scribe's personal observations and the provider's statements to them. This document has been checked and approved by the attending  provider.  I have reviewed the above documentation for accuracy and completeness, and I agree with the above.  Kelby Fam. Whitney Muse, MD

## 2015-06-24 NOTE — Progress Notes (Signed)
Labs drawn

## 2015-06-24 NOTE — Patient Instructions (Signed)
[place] [facility] Discharge Instructions  RECOMMENDATIONS MADE BY THE CONSULTANT AND ANY TEST RESULTS WILL BE SENT TO YOUR REFERRING PHYSICIAN.  Per Dr.Penland you need to take over-the-counter calcium and vitamin D. Take at least 600 mg Calcium daily and 1000 units Vitamin D daily. You have been prescribed Arimidex. Take as directed. We will also plan on giving an injection Prolia. We will get insurance approval first. We will plan for injection at your next follow up/ Return as scheduled. Anastrozole tablets What is this medicine? ANASTROZOLE (an AS troe zole) is used to treat breast cancer in women who have gone through menopause. Some types of breast cancer depend on estrogen to grow, and this medicine can stop tumor growth by blocking estrogen production. This medicine may be used for other purposes; ask your health care provider or pharmacist if you have questions. COMMON BRAND NAME(S): Arimidex What should I tell my health care provider before I take this medicine? They need to know if you have any of these conditions: -liver disease -an unusual or allergic reaction to anastrozole, other medicines, foods, dyes, or preservatives -pregnant or trying to get pregnant -breast-feeding How should I use this medicine? Take this medicine by mouth with a glass of water. Follow the directions on the prescription label. You can take this medicine with or without food. Take your doses at regular intervals. Do not take your medicine more often than directed. Do not stop taking except on the advice of your doctor or health care professional. Talk to your pediatrician regarding the use of this medicine in children. Special care may be needed. Overdosage: If you think you have taken too much of this medicine contact a poison control center or emergency room at once. NOTE: This medicine is only for you. Do not share this medicine with others. What if I miss a dose? If  you miss a dose, take it as soon as you can. If it is almost time for your next dose, take only that dose. Do not take double or extra doses. What may interact with this medicine? Do not take this medicine with any of the following medications: -female hormones, like estrogens or progestins and birth control pills This medicine may also interact with the following medications: -tamoxifen This list may not describe all possible interactions. Give your health care provider a list of all the medicines, herbs, non-prescription drugs, or dietary supplements you use. Also tell them if you smoke, drink alcohol, or use illegal drugs. Some items may interact with your medicine. What should I watch for while using this medicine? Visit your doctor or health care professional for regular checks on your progress. Let your doctor or health care professional know about any unusual vaginal bleeding. Do not treat yourself for diarrhea, nausea, vomiting or other side effects. Ask your doctor or health care professional for advice. What side effects may I notice from receiving this medicine? Side effects that you should report to your doctor or health care professional as soon as possible: -allergic reactions like skin rash, itching or hives, swelling of the face, lips, or tongue -any Arlester Keehan or unusual symptoms -breathing problems -chest pain -leg pain or swelling -vomiting Side effects that usually do not require medical attention (report to your doctor or health care professional if they continue or are bothersome): -back or bone pain -cough, or throat infection -diarrhea or constipation -dizziness -headache -hot flashes -loss of appetite -nausea -sweating -weakness and tiredness -weight gain This  list may not describe all possible side effects. Call your doctor for medical advice about side effects. You may report side effects to FDA at 1-800-FDA-1088. Where should I keep my medicine? Keep out of the  reach of children. Store at room temperature between 20 and 25 degrees C (68 and 77 degrees F). Throw away any unused medicine after the expiration date. NOTE: This sheet is a summary. It may not cover all possible information. If you have questions about this medicine, talk to your doctor, pharmacist, or health care provider.  2015, Elsevier/Gold Standard. (2008-02-22 16:31:52)  Denosumab injection What is this medicine? DENOSUMAB (den oh sue mab) slows bone breakdown. Prolia is used to treat osteoporosis in women after menopause and in men. Delton See is used to prevent bone fractures and other bone problems caused by cancer bone metastases. Delton See is also used to treat giant cell tumor of the bone. This medicine may be used for other purposes; ask your health care provider or pharmacist if you have questions. COMMON BRAND NAME(S): Prolia, XGEVA What should I tell my health care provider before I take this medicine? They need to know if you have any of these conditions: -dental disease -eczema -infection or history of infections -kidney disease or on dialysis -low blood calcium or vitamin D -malabsorption syndrome -scheduled to have surgery or tooth extraction -taking medicine that contains denosumab -thyroid or parathyroid disease -an unusual reaction to denosumab, other medicines, foods, dyes, or preservatives -pregnant or trying to get pregnant -breast-feeding How should I use this medicine? This medicine is for injection under the skin. It is given by a health care professional in a hospital or clinic setting. If you are getting Prolia, a special MedGuide will be given to you by the pharmacist with each prescription and refill. Be sure to read this information carefully each time. For Prolia, talk to your pediatrician regarding the use of this medicine in children. Special care may be needed. For Delton See, talk to your pediatrician regarding the use of this medicine in children. While this  drug may be prescribed for children as young as 13 years for selected conditions, precautions do apply. Overdosage: If you think you've taken too much of this medicine contact a poison control center or emergency room at once. Overdosage: If you think you have taken too much of this medicine contact a poison control center or emergency room at once. NOTE: This medicine is only for you. Do not share this medicine with others. What if I miss a dose? It is important not to miss your dose. Call your doctor or health care professional if you are unable to keep an appointment. What may interact with this medicine? Do not take this medicine with any of the following medications: -other medicines containing denosumab This medicine may also interact with the following medications: -medicines that suppress the immune system -medicines that treat cancer -steroid medicines like prednisone or cortisone This list may not describe all possible interactions. Give your health care provider a list of all the medicines, herbs, non-prescription drugs, or dietary supplements you use. Also tell them if you smoke, drink alcohol, or use illegal drugs. Some items may interact with your medicine. What should I watch for while using this medicine? Visit your doctor or health care professional for regular checks on your progress. Your doctor or health care professional may order blood tests and other tests to see how you are doing. Call your doctor or health care professional if you get a cold  or other infection while receiving this medicine. Do not treat yourself. This medicine may decrease your body's ability to fight infection. You should make sure you get enough calcium and vitamin D while you are taking this medicine, unless your doctor tells you not to. Discuss the foods you eat and the vitamins you take with your health care professional. See your dentist regularly. Brush and floss your teeth as directed. Before you  have any dental work done, tell your dentist you are receiving this medicine. Do not become pregnant while taking this medicine or for 5 months after stopping it. Women should inform their doctor if they wish to become pregnant or think they might be pregnant. There is a potential for serious side effects to an unborn child. Talk to your health care professional or pharmacist for more information. What side effects may I notice from receiving this medicine? Side effects that you should report to your doctor or health care professional as soon as possible: -allergic reactions like skin rash, itching or hives, swelling of the face, lips, or tongue -breathing problems -chest pain -fast, irregular heartbeat -feeling faint or lightheaded, falls -fever, chills, or any other sign of infection -muscle spasms, tightening, or twitches -numbness or tingling -skin blisters or bumps, or is dry, peels, or red -slow healing or unexplained pain in the mouth or jaw -unusual bleeding or bruising Side effects that usually do not require medical attention (Report these to your doctor or health care professional if they continue or are bothersome.): -muscle pain -stomach upset, gas This list may not describe all possible side effects. Call your doctor for medical advice about side effects. You may report side effects to FDA at 1-800-FDA-1088. Where should I keep my medicine? This medicine is only given in a clinic, doctor's office, or other health care setting and will not be stored at home. NOTE: This sheet is a summary. It may not cover all possible information. If you have questions about this medicine, talk to your doctor, pharmacist, or health care provider.  2015, Elsevier/Gold Standard. (2012-06-11 12:37:47)   Thank you for choosing [facility] to provide your oncology and hematology care.  To afford each patient quality time with our provider, please arrive at least  15 minutes before your scheduled appointment time.    You need to re-schedule your appointment should you arrive 10 or more minutes late.  We strive to give you quality time with our providers, and arriving late affects you and other patients whose appointments are after yours.  Also, if you no show three or more times for appointments you may be dismissed from the clinic at the providers discretion.     Again, thank you for choosing [facility].  Our hope is that these requests will decrease the amount of time that you wait before being seen by our physicians.       _____________________________________________________________  Should you have questions after your visit to [facility], please contact our office at [phone] between the hours of 8:30 a.m. and 4:30 p.m.  Voicemails left after 4:30 p.m. will not be returned until the following business day.  For prescription refill requests, have your pharmacy contact our office.

## 2015-06-25 LAB — VITAMIN D 25 HYDROXY (VIT D DEFICIENCY, FRACTURES): VIT D 25 HYDROXY: 15.4 ng/mL — AB (ref 30.0–100.0)

## 2015-06-26 ENCOUNTER — Other Ambulatory Visit (HOSPITAL_COMMUNITY): Payer: Self-pay | Admitting: *Deleted

## 2015-06-26 ENCOUNTER — Other Ambulatory Visit (HOSPITAL_COMMUNITY): Payer: Self-pay | Admitting: Hematology & Oncology

## 2015-06-26 MED ORDER — ERGOCALCIFEROL 1.25 MG (50000 UT) PO CAPS: 50000.0000 [IU] | ORAL_CAPSULE | ORAL | Status: DC

## 2015-07-10 DIAGNOSIS — Z923 Personal history of irradiation: Secondary | ICD-10-CM | POA: Diagnosis not present

## 2015-07-10 DIAGNOSIS — C50411 Malignant neoplasm of upper-outer quadrant of right female breast: Secondary | ICD-10-CM | POA: Diagnosis not present

## 2015-07-23 ENCOUNTER — Encounter (HOSPITAL_COMMUNITY): Payer: Commercial Managed Care - HMO | Attending: Hematology & Oncology | Admitting: Hematology & Oncology

## 2015-07-23 ENCOUNTER — Encounter (HOSPITAL_COMMUNITY): Payer: Self-pay | Admitting: Hematology & Oncology

## 2015-07-23 ENCOUNTER — Encounter (HOSPITAL_COMMUNITY): Payer: Commercial Managed Care - HMO

## 2015-07-23 VITALS — BP 135/67 | HR 84 | Temp 98.2°F | Resp 18 | Wt 243.5 lb

## 2015-07-23 DIAGNOSIS — C50411 Malignant neoplasm of upper-outer quadrant of right female breast: Secondary | ICD-10-CM | POA: Diagnosis not present

## 2015-07-23 DIAGNOSIS — E559 Vitamin D deficiency, unspecified: Secondary | ICD-10-CM

## 2015-07-23 DIAGNOSIS — C50911 Malignant neoplasm of unspecified site of right female breast: Secondary | ICD-10-CM | POA: Insufficient documentation

## 2015-07-23 DIAGNOSIS — M8580 Other specified disorders of bone density and structure, unspecified site: Secondary | ICD-10-CM | POA: Diagnosis not present

## 2015-07-23 DIAGNOSIS — Z79899 Other long term (current) drug therapy: Secondary | ICD-10-CM | POA: Insufficient documentation

## 2015-07-23 DIAGNOSIS — C50919 Malignant neoplasm of unspecified site of unspecified female breast: Secondary | ICD-10-CM | POA: Insufficient documentation

## 2015-07-23 NOTE — Patient Instructions (Addendum)
[place] at [facility] Discharge Instructions  RECOMMENDATIONS MADE BY THE CONSULTANT AND ANY TEST RESULTS WILL BE SENT TO YOUR REFERRING PHYSICIAN.  Exam and discussion by Dr. Whitney Muse Will get you scheduled for prolia injection - will need prior authorization Will also need blood work if none done within 28 days of the injection Report any new lumps, bone pain, shortness of breath or other symptoms.  Follow-up in 3 months.     Thank you for choosing [place] at [facility] to provide your oncology and hematology care.  To afford each patient quality time with our provider, please arrive at least 15 minutes before your scheduled appointment time.    You need to re-schedule your appointment should you arrive 10 or more minutes late.  We strive to give you quality time with our providers, and arriving late affects you and other patients whose appointments are after yours.  Also, if you no show three or more times for appointments you may be dismissed from the clinic at the providers discretion.     Again, thank you for choosing [facility].  Our hope is that these requests will decrease the amount of time that you wait before being seen by our physicians.       _____________________________________________________________  Should you have questions after your visit to [facility], please contact our office at [phone] between the hours of 8:30 a.m. and 4:30 p.m.  Voicemails left after 4:30 p.m. will not be returned until the following business day.  For prescription refill requests, have your pharmacy contact our office.    Denosumab injection What is this medicine? DENOSUMAB (den oh sue mab) slows bone breakdown. Prolia is used to treat osteoporosis in women after menopause and in men. Delton See is used to prevent bone fractures and other bone problems caused by cancer bone metastases. Delton See is also used to treat  giant cell tumor of the bone. This medicine may be used for other purposes; ask your health care provider or pharmacist if you have questions. COMMON BRAND NAME(S): Prolia, XGEVA What should I tell my health care provider before I take this medicine? They need to know if you have any of these conditions: -dental disease -eczema -infection or history of infections -kidney disease or on dialysis -low blood calcium or vitamin D -malabsorption syndrome -scheduled to have surgery or tooth extraction -taking medicine that contains denosumab -thyroid or parathyroid disease -an unusual reaction to denosumab, other medicines, foods, dyes, or preservatives -pregnant or trying to get pregnant -breast-feeding How should I use this medicine? This medicine is for injection under the skin. It is given by a health care professional in a hospital or clinic setting. If you are getting Prolia, a special MedGuide will be given to you by the pharmacist with each prescription and refill. Be sure to read this information carefully each time. For Prolia, talk to your pediatrician regarding the use of this medicine in children. Special care may be needed. For Delton See, talk to your pediatrician regarding the use of this medicine in children. While this drug may be prescribed for children as young as 13 years for selected conditions, precautions do apply. Overdosage: If you think you've taken too much of this medicine contact a poison control center or emergency room at once. Overdosage: If you think you have taken too much of this medicine contact a poison control center or emergency room at once. NOTE: This medicine  is only for you. Do not share this medicine with others. What if I miss a dose? It is important not to miss your dose. Call your doctor or health care professional if you are unable to keep an appointment. What may interact with this medicine? Do not take this medicine with any of the following  medications: -other medicines containing denosumab This medicine may also interact with the following medications: -medicines that suppress the immune system -medicines that treat cancer -steroid medicines like prednisone or cortisone This list may not describe all possible interactions. Give your health care provider a list of all the medicines, herbs, non-prescription drugs, or dietary supplements you use. Also tell them if you smoke, drink alcohol, or use illegal drugs. Some items may interact with your medicine. What should I watch for while using this medicine? Visit your doctor or health care professional for regular checks on your progress. Your doctor or health care professional may order blood tests and other tests to see how you are doing. Call your doctor or health care professional if you get a cold or other infection while receiving this medicine. Do not treat yourself. This medicine may decrease your body's ability to fight infection. You should make sure you get enough calcium and vitamin D while you are taking this medicine, unless your doctor tells you not to. Discuss the foods you eat and the vitamins you take with your health care professional. See your dentist regularly. Brush and floss your teeth as directed. Before you have any dental work done, tell your dentist you are receiving this medicine. Do not become pregnant while taking this medicine or for 5 months after stopping it. Women should inform their doctor if they wish to become pregnant or think they might be pregnant. There is a potential for serious side effects to an unborn child. Talk to your health care professional or pharmacist for more information. What side effects may I notice from receiving this medicine? Side effects that you should report to your doctor or health care professional as soon as possible: -allergic reactions like skin rash, itching or hives, swelling of the face, lips, or tongue -breathing  problems -chest pain -fast, irregular heartbeat -feeling faint or lightheaded, falls -fever, chills, or any other sign of infection -muscle spasms, tightening, or twitches -numbness or tingling -skin blisters or bumps, or is dry, peels, or red -slow healing or unexplained pain in the mouth or jaw -unusual bleeding or bruising Side effects that usually do not require medical attention (Report these to your doctor or health care professional if they continue or are bothersome.): -muscle pain -stomach upset, gas This list may not describe all possible side effects. Call your doctor for medical advice about side effects. You may report side effects to FDA at 1-800-FDA-1088. Where should I keep my medicine? This medicine is only given in a clinic, doctor's office, or other health care setting and will not be stored at home. NOTE: This sheet is a summary. It may not cover all possible information. If you have questions about this medicine, talk to your doctor, pharmacist, or health care provider.  2015, Elsevier/Gold Standard. (2012-06-11 12:37:47)

## 2015-07-23 NOTE — Progress Notes (Signed)
[place] Progress Note  Patient Care Team: Asencion Noble, MD as PCP - General (Internal Medicine) Daneil Dolin, MD (Gastroenterology)  CHIEF COMPLAINTS/PURPOSE OF CONSULTATION:  Right Partial mastectomy with needle localization and axillary sentinel node on 03/18/2015 with Dr. 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   04/30/2015 - 06/05/2015 Radiation Therapy Right breast 50 Gy at 2 Gy/fraction x 25 fractions.  Dr. Pablo Ledger   06/08/2015 Imaging DEXA osteopenia     HISTORY OF PRESENTING ILLNESS:  Crystal [name] 74 y.o. 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 She has done well from a surgical perspective. She denies fever or chills. She denies any pain. She has completed XRT.  She is here alone today and in good spirits. She reports that her breast has healed well from radiation. She has no difficulty tolerating arimidex and takes it daily. She denies worsening joint pain or hot flashes. She mentions that her legs swell at night, however this is normal. She has no other problems or concerns.    MEDICAL HISTORY:  Past Medical History  Diagnosis Date  . 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 Diverticulosis   . Tubular adenoma of colon 08/2007  . HTN (hypertension)   . Depression   . GERD (gastroesophageal reflux disease)   . Chronic fatigue   . OA (osteoarthritis)   . Spinal stenosis   . Sleep apnea     cpap  . Obesity   . PONV (postoperative nausea and vomiting)   . Enlarged thyroid   . Breast cancer 03/21/15    right    SURGICAL HISTORY: Past Surgical History  Procedure  Laterality Date  . Tubal ligation  1970    hysterectomy  . Abdominal hysterectomy      partial  . Breast cyst excision      benign, left x2, right x1  . Cataract extraction w/phaco  12/01/2011    Procedure: CATARACT EXTRACTION PHACO AND INTRAOCULAR LENS PLACEMENT (IOC);  Surgeon: Tonny Branch;  Location: [facility];  Service: Ophthalmology;  Laterality: Right;  CDE=14.87  . Cataract extraction w/phaco  02/06/2012    Procedure: CATARACT EXTRACTION PHACO AND INTRAOCULAR LENS PLACEMENT (IOC);  Surgeon: Tonny Branch, MD;  Location: [facility];  Service: Ophthalmology;  Laterality: Left;  CDE 13.00  . Colonoscopy  Sept 2008    Last colonoscopy/bx by Dr Rourk->(08/31/07) descending colon TUBULAR ADENOMA  . Colonoscopy  10/25/2012    Procedure: COLONOSCOPY;  Surgeon: Daneil Dolin, MD;  Location: [facility];  Service: Endoscopy;  Laterality: N/A;  10:15-changed to 10:30 Darius Bump notified  . Partial mastectomy with needle localization and axillary sentinel lymph node bx Right 03/18/2015    Procedure: PARTIAL MASTECTOMY AFTER NEEDLE LOCALIZATION AND AXILLARY SENTINEL LYMPH NODE BX;  Surgeon: Aviva Signs Md, MD;  Location: [facility];  Service: General;  Laterality: Right;    SOCIAL HISTORY: History   Social History  . Marital Status: Widowed    Spouse Name: N/A  . Number of Children: 2  . Years of Education: N/A   Occupational History  . Lab Tech-retired    Social  History Main Topics  . Smoking status: Never Smoker   . Smokeless tobacco: Never Used  . Alcohol Use: No  . Drug Use: No  . Sexual Activity: No   Other Topics Concern  . Not on file   Social History Narrative   she has 3 children. She is widowed, her husband died from prostate cancer 6 years ago. They were married for 49 years. She has 5 grandchildren. She was born here in [place]. She is a nonsmoker, never smoker. She does not drink alcohol. She enjoys reading, puzzles, and playing solitaire  FAMILY HISTORY: Family History    Problem Relation Age of Onset  . Cancer Brother 30  . Coronary artery disease Brother   . Diabetes Sister   . Anesthesia problems Neg Hx   . Hypotension Neg Hx   . Pseudochol deficiency Neg Hx   . Malignant hyperthermia Neg Hx   . Colon cancer Neg Hx    indicated that only one of her two brothers is alive.  2 brothers are deceased, one at the age of 78 from metastatic cancer of unknown primary. A second brother died from complications of heart disease. He was 77. She has one sister who is alive and has arthritis but is otherwise healthy. Her mother died at the age of 40 from age-related causes. Father died at 50 from complications of heart disease.   ALLERGIES:  has No Known Allergies.  MEDICATIONS:  Current Outpatient Prescriptions  Medication Sig Dispense Refill  . acetaminophen (TYLENOL ARTHRITIS PAIN) 650 MG CR tablet Take 1,300 mg by mouth 2 (two) times daily.     Marland Kitchen amLODipine (NORVASC) 10 MG tablet Take 10 mg by mouth daily.     Marland Kitchen anastrozole (ARIMIDEX) 1 MG tablet Take 1 tablet (1 mg total) by mouth daily. 30 tablet 2  . aspirin EC 81 MG tablet Take 81 mg by mouth daily.      Marland Kitchen buPROPion (WELLBUTRIN SR) 150 MG 12 hr tablet Take 150 mg by mouth 2 (two) times daily.     . cycloSPORINE (RESTASIS) 0.05 % ophthalmic emulsion Place 1 drop into both eyes 2 (two) times daily as needed. For dry eyes    . FLUoxetine (PROZAC) 20 MG capsule Take 60 mg by mouth daily.     . fluticasone (FLONASE) 50 MCG/ACT nasal spray Place 1 spray into both nostrils daily.    Marland Kitchen HYDROMET 5-1.5 MG/5ML syrup Take 5 mLs by mouth 2 (two) times daily as needed for cough.     . loratadine (CLARITIN) 10 MG tablet Take 10 mg by mouth daily.      Marland Kitchen losartan-hydrochlorothiazide (HYZAAR) 100-25 MG per tablet Take 1 tablet by mouth daily.    Marland Kitchen omeprazole (PRILOSEC) 20 MG capsule Take 20 mg by mouth daily.     . simvastatin (ZOCOR) 10 MG tablet Take 10 mg by mouth daily.    . Vitamin D, Ergocalciferol, (DRISDOL) 50000  UNITS CAPS capsule TAKE 1 CAPSULE BY MOUTH ONCE A WEEK 12 capsule 1   No current facility-administered medications for this visit.    Review of Systems  Constitutional: Negative for fever, chills, weight loss and malaise/fatigue.  HENT: Negative for congestion, hearing loss, nosebleeds, sore throat and tinnitus.   Eyes: Negative for blurred vision, double vision, pain and discharge.  Respiratory: Negative for cough, hemoptysis, sputum production, shortness of breath and wheezing.   Cardiovascular: Negative for chest pain, palpitations, claudication, leg swelling and PND.  Gastrointestinal: Negative for heartburn, nausea, vomiting,  abdominal pain, diarrhea, constipation, blood in stool and melena.  Genitourinary: Negative for dysuria, urgency, frequency and hematuria.  Musculoskeletal: Positive for joint pain. Negative for myalgias and falls.  Arthritis. Skin: Negative for itching and rash.  Neurological: Negative for dizziness, tingling, tremors, sensory change, speech change, focal weakness, seizures, loss of consciousness, weakness and headaches.  Endo/Heme/Allergies: Does not bruise/bleed easily.  Psychiatric/Behavioral: Negative for depression, suicidal ideas, memory loss and substance abuse. The patient is not nervous/anxious and does not have insomnia.    PHYSICAL EXAMINATION:  ECOG PERFORMANCE STATUS: 0 - Asymptomatic  Filed Vitals:   07/23/15 0900  BP: 135/67  Pulse: 84  Temp: 98.2 F (36.8 C)  Resp: 18   Filed Weights   07/23/15 0900  Weight: 243 lb 8 oz (110.451 kg)     Physical Exam  Constitutional: She is oriented to person, place, and time and well-developed, well-nourished, and in no distress.  Obese  HENT:  Head: Normocephalic and atraumatic.  Nose: Nose normal.  Mouth/Throat: Oropharynx is clear and moist. No oropharyngeal exudate.  Eyes: Conjunctivae and EOM are normal. Pupils are equal, round, and reactive to light. Right eye exhibits no discharge. Left  eye exhibits no discharge. No scleral icterus.  Neck: Normal range of motion. Neck supple. No tracheal deviation present. No thyromegaly present.  Cardiovascular: Normal rate, regular rhythm and normal heart sounds.  Exam reveals no gallop and no friction rub.   No murmur heard. Pulmonary/Chest: Effort normal and breath sounds normal. She has no wheezes. She has no rales.   Breast:  Right breast is slightly pink from radiation, incision site is at the 9 o'clock position and is well healed. Abdominal: Soft. Bowel sounds are normal. She exhibits no distension and no mass. There is no tenderness. There is no rebound and no guarding.  Musculoskeletal: Normal range of motion. She exhibits no edema.  Lymphadenopathy:    She has no cervical adenopathy.  Neurological: She is alert and oriented to person, place, and time. She has normal reflexes. No cranial nerve deficit. Gait normal. Coordination normal.  Skin: Skin is warm and dry. No rash noted.  Psychiatric: Mood, memory, affect and judgment normal.  Nursing note and vitals reviewed.   LABORATORY DATA:  I have reviewed the data as listed Lab Results  Component Value Date   WBC 3.9* 06/24/2015   HGB 13.1 06/24/2015   HCT 39.2 06/24/2015   MCV 96.3 06/24/2015   PLT 185 06/24/2015     Chemistry      Component Value Date/Time   NA 139 06/24/2015 0928   K 4.0 06/24/2015 0928   CL 104 06/24/2015 0928   CO2 27 06/24/2015 0928   BUN 13 06/24/2015 0928   CREATININE 0.75 06/24/2015 0928      Component Value Date/Time   CALCIUM 9.0 06/24/2015 0928   ALKPHOS 72 06/24/2015 0928   AST 27 06/24/2015 0928   ALT 22 06/24/2015 0928   BILITOT 0.7 06/24/2015 0928       RADIOGRAPHIC STUDIES: I have personally reviewed the radiological images as listed and agreed with the findings in the report.  EXAM: DUAL X-RAY ABSORPTIOMETRY (DXA) FOR BONE MINERAL DENSITY  IMPRESSION: Ordering Physician: Dr. Patrici Ranks, ASSESSMENT: BMD as  determined from Femur Neck Right is 0.843 g/cm2 with a T-Score of -1.4. This patient is considered osteopenic according to [facility]) criteria. Per official position of the ISCD, it is not possible to quantitatively compare BMD or calculate a LSC between  different facilities. (Lumbar spine was not utilized due to advanced degenerative changes.) See FRAX report on next page.  ASSESSMENT & PLAN:   Stage I, ER+. 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 Recurrence score of 4 with 10 year risk of distant recurrence 5% with Tamoxifen alone Osteopenia  Pleasant 74 year old female with a stage I ER positive, PR positive, and HER-2 negative carcinoma of the breast. Largest tumor dimension was 0.9 cm.  She has completed XRT.She continues on arimidex 1 mg daily. She is doing well with no complaints.  I have ordered prolia, she is on calcium and vitamin D. She has excellent dental care.Risks and benefits of prolia were again discussed including risk of osteonecrosis of the jaw.   We will schedule for a routine follow up in 3 months. We will schedule for her to receive a Prolia shot before our next visit.  All questions were answered. The patient knows to call the clinic with any problems, questions or concerns.   This note was electronically signed.    This document serves as a record of services personally performed by Ancil Linsey, MD. It was created on her behalf by Arlyce Harman, a trained medical scribe. The creation of this record is based on the scribe's personal observations and the provider's statements to them. This document has been checked and approved by the attending provider.  I have reviewed the above documentation for accuracy and completeness, and I agree with the above.  Kelby Fam. Whitney Muse, MD

## 2015-07-29 ENCOUNTER — Encounter (HOSPITAL_COMMUNITY): Payer: Commercial Managed Care - HMO | Attending: Hematology & Oncology

## 2015-07-29 VITALS — BP 119/46 | HR 80 | Temp 98.0°F | Resp 20

## 2015-07-29 DIAGNOSIS — M8580 Other specified disorders of bone density and structure, unspecified site: Secondary | ICD-10-CM | POA: Diagnosis not present

## 2015-07-29 DIAGNOSIS — Z79899 Other long term (current) drug therapy: Secondary | ICD-10-CM | POA: Insufficient documentation

## 2015-07-29 DIAGNOSIS — C50919 Malignant neoplasm of unspecified site of unspecified female breast: Secondary | ICD-10-CM | POA: Insufficient documentation

## 2015-07-29 DIAGNOSIS — C50911 Malignant neoplasm of unspecified site of right female breast: Secondary | ICD-10-CM | POA: Insufficient documentation

## 2015-07-29 MED ORDER — SODIUM CHLORIDE 0.9 % IV SOLN: Freq: Once | INTRAVENOUS | Status: DC

## 2015-07-29 MED ORDER — DENOSUMAB 60 MG/ML ~~LOC~~ SOLN
60.0000 mg | Freq: Once | SUBCUTANEOUS | Status: AC
Start: 2015-07-29 — End: 2015-07-29
  Administered 2015-07-29: 60 mg via SUBCUTANEOUS
  Filled 2015-07-29: qty 1

## 2015-07-29 NOTE — Patient Instructions (Signed)
[place] at [facility] Discharge Instructions  RECOMMENDATIONS MADE BY THE CONSULTANT AND ANY TEST RESULTS WILL BE SENT TO YOUR REFERRING PHYSICIAN.  Prolia 60 mg injection given today as ordered. Return as scheduled. Denosumab injection What is this medicine? DENOSUMAB (den oh sue mab) slows bone breakdown. Prolia is used to treat osteoporosis in women after menopause and in men. Delton See is used to prevent bone fractures and other bone problems caused by cancer bone metastases. Delton See is also used to treat giant cell tumor of the bone. This medicine may be used for other purposes; ask your health care provider or pharmacist if you have questions. COMMON BRAND NAME(S): Prolia, XGEVA What should I tell my health care provider before I take this medicine? They need to know if you have any of these conditions: -dental disease -eczema -infection or history of infections -kidney disease or on dialysis -low blood calcium or vitamin D -malabsorption syndrome -scheduled to have surgery or tooth extraction -taking medicine that contains denosumab -thyroid or parathyroid disease -an unusual reaction to denosumab, other medicines, foods, dyes, or preservatives -pregnant or trying to get pregnant -breast-feeding How should I use this medicine? This medicine is for injection under the skin. It is given by a health care professional in a hospital or clinic setting. If you are getting Prolia, a special MedGuide will be given to you by the pharmacist with each prescription and refill. Be sure to read this information carefully each time. For Prolia, talk to your pediatrician regarding the use of this medicine in children. Special care may be needed. For Delton See, talk to your pediatrician regarding the use of this medicine in children. While this drug may be prescribed for children as young as 13 years for selected conditions, precautions do apply. Overdosage: If you think you've  taken too much of this medicine contact a poison control center or emergency room at once. Overdosage: If you think you have taken too much of this medicine contact a poison control center or emergency room at once. NOTE: This medicine is only for you. Do not share this medicine with others. What if I miss a dose? It is important not to miss your dose. Call your doctor or health care professional if you are unable to keep an appointment. What may interact with this medicine? Do not take this medicine with any of the following medications: -other medicines containing denosumab This medicine may also interact with the following medications: -medicines that suppress the immune system -medicines that treat cancer -steroid medicines like prednisone or cortisone This list may not describe all possible interactions. Give your health care provider a list of all the medicines, herbs, non-prescription drugs, or dietary supplements you use. Also tell them if you smoke, drink alcohol, or use illegal drugs. Some items may interact with your medicine. What should I watch for while using this medicine? Visit your doctor or health care professional for regular checks on your progress. Your doctor or health care professional may order blood tests and other tests to see how you are doing. Call your doctor or health care professional if you get a cold or other infection while receiving this medicine. Do not treat yourself. This medicine may decrease your body's ability to fight infection. You should make sure you get enough calcium and vitamin D while you are taking this medicine, unless your doctor tells you not to. Discuss the foods you eat and the vitamins you take with your health care professional. See  your dentist regularly. Brush and floss your teeth as directed. Before you have any dental work done, tell your dentist you are receiving this medicine. Do not become pregnant while taking this medicine or for 5  months after stopping it. Women should inform their doctor if they wish to become pregnant or think they might be pregnant. There is a potential for serious side effects to an unborn child. Talk to your health care professional or pharmacist for more information. What side effects may I notice from receiving this medicine? Side effects that you should report to your doctor or health care professional as soon as possible: -allergic reactions like skin rash, itching or hives, swelling of the face, lips, or tongue -breathing problems -chest pain -fast, irregular heartbeat -feeling faint or lightheaded, falls -fever, chills, or any other sign of infection -muscle spasms, tightening, or twitches -numbness or tingling -skin blisters or bumps, or is dry, peels, or red -slow healing or unexplained pain in the mouth or jaw -unusual bleeding or bruising Side effects that usually do not require medical attention (Report these to your doctor or health care professional if they continue or are bothersome.): -muscle pain -stomach upset, gas This list may not describe all possible side effects. Call your doctor for medical advice about side effects. You may report side effects to FDA at 1-800-FDA-1088. Where should I keep my medicine? This medicine is only given in a clinic, doctor's office, or other health care setting and will not be stored at home. NOTE: This sheet is a summary. It may not cover all possible information. If you have questions about this medicine, talk to your doctor, pharmacist, or health care provider.  2015, Elsevier/Gold Standard. (2012-06-11 12:37:47)   Thank you for choosing [place] at [facility] to provide your oncology and hematology care.  To afford each patient quality time with our provider, please arrive at least 15 minutes before your scheduled appointment time.    You need to re-schedule your appointment should you arrive 10 or more minutes  late.  We strive to give you quality time with our providers, and arriving late affects you and other patients whose appointments are after yours.  Also, if you no show three or more times for appointments you may be dismissed from the clinic at the providers discretion.     Again, thank you for choosing [facility].  Our hope is that these requests will decrease the amount of time that you wait before being seen by our physicians.       _____________________________________________________________  Should you have questions after your visit to [facility], please contact our office at [phone] between the hours of 8:30 a.m. and 4:30 p.m.  Voicemails left after 4:30 p.m. will not be returned until the following business day.  For prescription refill requests, have your pharmacy contact our office.

## 2015-07-29 NOTE — Progress Notes (Signed)
Crystal [name] presents today for injection per MD orders. Prolia 60 mg administered SQ in left Abdomen. Administration without incident. Patient tolerated well.

## 2015-08-14 DIAGNOSIS — Z6841 Body Mass Index (BMI) 40.0 and over, adult: Secondary | ICD-10-CM | POA: Diagnosis not present

## 2015-08-14 DIAGNOSIS — R05 Cough: Secondary | ICD-10-CM | POA: Diagnosis not present

## 2015-09-10 DIAGNOSIS — Z6841 Body Mass Index (BMI) 40.0 and over, adult: Secondary | ICD-10-CM | POA: Diagnosis not present

## 2015-09-10 DIAGNOSIS — R05 Cough: Secondary | ICD-10-CM | POA: Diagnosis not present

## 2015-09-10 DIAGNOSIS — C50911 Malignant neoplasm of unspecified site of right female breast: Secondary | ICD-10-CM | POA: Diagnosis not present

## 2015-09-10 DIAGNOSIS — Z23 Encounter for immunization: Secondary | ICD-10-CM | POA: Diagnosis not present

## 2015-09-10 DIAGNOSIS — I10 Essential (primary) hypertension: Secondary | ICD-10-CM | POA: Diagnosis not present

## 2015-09-17 ENCOUNTER — Encounter: Payer: Self-pay | Admitting: Pulmonary Disease

## 2015-09-17 ENCOUNTER — Ambulatory Visit (INDEPENDENT_AMBULATORY_CARE_PROVIDER_SITE_OTHER)
Admission: RE | Admit: 2015-09-17 | Discharge: 2015-09-17 | Disposition: A | Discharge status: Home / Self Care | Payer: Commercial Managed Care - HMO | Source: Ambulatory Visit | Attending: Pulmonary Disease | Admitting: Pulmonary Disease

## 2015-09-17 ENCOUNTER — Other Ambulatory Visit (HOSPITAL_COMMUNITY): Payer: Self-pay | Admitting: Hematology & Oncology

## 2015-09-17 ENCOUNTER — Ambulatory Visit (INDEPENDENT_AMBULATORY_CARE_PROVIDER_SITE_OTHER): Payer: Commercial Managed Care - HMO | Admitting: Pulmonary Disease

## 2015-09-17 VITALS — BP 140/80 | HR 90 | Ht 65.0 in | Wt 237.6 lb

## 2015-09-17 DIAGNOSIS — R05 Cough: Secondary | ICD-10-CM | POA: Diagnosis not present

## 2015-09-17 DIAGNOSIS — R053 Chronic cough: Secondary | ICD-10-CM

## 2015-09-17 DIAGNOSIS — G4733 Obstructive sleep apnea (adult) (pediatric): Secondary | ICD-10-CM | POA: Diagnosis not present

## 2015-09-17 MED ORDER — FLUTICASONE-SALMETEROL 100-50 MCG/DOSE IN AEPB: 1.0000 | INHALATION_SPRAY | Freq: Two times a day (BID) | RESPIRATORY_TRACT | Status: DC

## 2015-09-17 MED ORDER — PANTOPRAZOLE SODIUM 40 MG PO TBEC: 40.0000 mg | DELAYED_RELEASE_TABLET | Freq: Two times a day (BID) | ORAL | Status: DC

## 2015-09-17 NOTE — Patient Instructions (Signed)
Increase flonase dose to 2 puffs bid Start Advair Increase protonix to 40 mg bid.  Return in 1 month.

## 2015-09-17 NOTE — Progress Notes (Signed)
Subjective:    Patient ID: Crystal [name], female    DOB: December 17, 1941, 74 y.o.   MRN: [id number]  HPI Consult for evaluation of chronic cough.  Crystal [name] is a 74 year old with past medical history as below. Complaints of chronic cough for several years. This is nonproductive in nature not associated with shortness of breath, wheezing, chest pain, sputum production, hemoptysis. She has significant issues with her sinusitis and postnasal drip. She states that she can feel the drip at the back of her throat with irritation and constant throat clearing. She also has history of GERD and she is on Protonix. The symptoms were exacerbated on a trip to the beach 2 months ago. She saw her primary care physician who gave her a Z pack. She also got a chest x-ray. I do not have the report with me but is reportedly normal as per the patient. She is also on a Flonase nasal spray which was started a month ago. This has apparently not helped with her symptoms. She is on Cozaar for her blood pressure medication. But she is unable to state if her symptoms of cough began when Cozaar was started.  She has history of OSA on CPAP. No history of allergies or exposures. She is a never smoker and does not drink alcohol.   Past Medical History  Diagnosis Date  . 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 Diverticulosis   . Tubular adenoma of colon 08/2007  . HTN (hypertension)   . Depression   . GERD (gastroesophageal reflux disease)   . Chronic fatigue   . OA (osteoarthritis)   . Spinal stenosis   . Sleep apnea     cpap  . Obesity   . PONV (postoperative nausea and vomiting)   . Enlarged thyroid   . Breast cancer 03/21/15    right    Current outpatient prescriptions:  .  acetaminophen (TYLENOL ARTHRITIS PAIN) 650 MG CR tablet, Take 1,300 mg by mouth 2 (two) times daily. , Disp: , Rfl:  .  amLODipine (NORVASC) 10 MG tablet, Take 10 mg by mouth daily. ,  Disp: , Rfl:  .  anastrozole (ARIMIDEX) 1 MG tablet, Take 1 tablet (1 mg total) by mouth daily., Disp: 30 tablet, Rfl: 2 .  aspirin EC 81 MG tablet, Take 81 mg by mouth daily.  , Disp: , Rfl:  .  buPROPion (WELLBUTRIN SR) 150 MG 12 hr tablet, Take 150 mg by mouth 2 (two) times daily. , Disp: , Rfl:  .  cycloSPORINE (RESTASIS) 0.05 % ophthalmic emulsion, Place 1 drop into both eyes 2 (two) times daily as needed. For dry eyes, Disp: , Rfl:  .  FLUoxetine (PROZAC) 20 MG capsule, Take 60 mg by mouth daily. , Disp: , Rfl:  .  fluticasone (FLONASE) 50 MCG/ACT nasal spray, Place 2 sprays into both nostrils daily. , Disp: , Rfl:  .  loratadine (CLARITIN) 10 MG tablet, Take 10 mg by mouth daily.  , Disp: , Rfl:  .  losartan-hydrochlorothiazide (HYZAAR) 100-25 MG per tablet, Take 1 tablet by mouth daily., Disp: , Rfl:  .  omeprazole (PRILOSEC) 20 MG capsule, Take 20 mg by mouth daily. , Disp: , Rfl:  .  simvastatin (ZOCOR) 10 MG tablet, Take 10 mg by mouth daily., Disp: , Rfl:  .  Vitamin D, Ergocalciferol, (DRISDOL) 50000 UNITS CAPS capsule, TAKE 1 CAPSULE BY MOUTH ONCE A WEEK, Disp: 12 capsule, Rfl: 1 .  Fluticasone-Salmeterol (ADVAIR DISKUS) 100-50 MCG/DOSE AEPB, Inhale 1 puff into the lungs 2 (two) times daily., Disp: 60 each, Rfl: 2 .  HYDROMET 5-1.5 MG/5ML syrup, Take 5 mLs by mouth 2 (two) times daily as needed for cough. , Disp: , Rfl:  .  pantoprazole (PROTONIX) 40 MG tablet, Take 1 tablet (40 mg total) by mouth 2 (two) times daily., Disp: 60 tablet, Rfl: 2   Review of Systems  Constitutional: Negative for fever, chills and unexpected weight change.  HENT: Positive for postnasal drip. Negative for congestion, dental problem, ear pain, nosebleeds, rhinorrhea, sinus pressure, sneezing, sore throat, trouble swallowing and voice change.   Eyes: Negative for visual disturbance.  Respiratory: Positive for cough. Negative for choking and shortness of breath.   Cardiovascular: Negative for chest pain  and leg swelling.  Gastrointestinal: Negative for vomiting, abdominal pain and diarrhea.  Genitourinary: Negative for difficulty urinating.  Musculoskeletal: Negative for arthralgias.  Skin: Negative for rash.  Neurological: Negative for tremors, syncope and headaches.  Hematological: Does not bruise/bleed easily.   Blood pressure 140/80, pulse 90, height 5\' 5"  (1.651 m), weight 237 lb 9.6 oz (107.775 kg), SpO2 97 %.    Objective:   Physical Exam  Constitutional: She is oriented to person, place, and time. She appears well-developed and well-nourished.  HENT:  Head: Normocephalic.  Mouth/Throat: Oropharyngeal exudate present.  Eyes: Conjunctivae are normal. Pupils are equal, round, and reactive to light.  Neck: Normal range of motion. Neck supple.  Cardiovascular: Regular rhythm.   Pulmonary/Chest: Effort normal and breath sounds normal.  Abdominal: Soft. Bowel sounds are normal.  Neurological: She is alert and oriented to person, place, and time.  Skin: Skin is warm and dry.      Assessment & Plan:   #1 Chronic cough. Not clear from history she has reactive avid disease/cough variant asthma. She does have significant sinus issues and postnasal drip that could be causing her cough. She also has GERD that could be contributing as well. She is on Cozaar which is associated with cough (although less than ACEIs)  I will increase the Flonase and Protonix dose and start her on Advair. Reevaluate in one month. If symptoms persists we can consider an ENT eval or trying to stop her Cozaar. She'll need PFTs but I'll get them after her cough has subsided.  #2 OSA. She is on CPAP treatment for the past 12 years. But never has been reevaluated. She states that her cough got worse when she got a new CPAP machine. We need to evaluate if she is getting adequate pressures on it.   Plan: - Increase flonase dose to 2 puffs bid. - Increase PPI to 40 mg bid. - Start advair. - CXR - Sleep  study.  Return in 1 month.  Marshell Garfinkel MD [place] [facility] Pager [phone] If no answer or after 3pm call: [phone] 09/17/2015, 4:45 PM

## 2015-09-18 ENCOUNTER — Other Ambulatory Visit (HOSPITAL_COMMUNITY): Payer: Self-pay | Admitting: Respiratory Therapy

## 2015-09-22 ENCOUNTER — Other Ambulatory Visit (HOSPITAL_COMMUNITY): Payer: Self-pay | Admitting: Oncology

## 2015-09-22 DIAGNOSIS — C50911 Malignant neoplasm of unspecified site of right female breast: Secondary | ICD-10-CM

## 2015-09-22 MED ORDER — ANASTROZOLE 1 MG PO TABS: 1.0000 mg | ORAL_TABLET | Freq: Every day | ORAL | Status: DC

## 2015-09-23 DIAGNOSIS — G4733 Obstructive sleep apnea (adult) (pediatric): Secondary | ICD-10-CM | POA: Diagnosis not present

## 2015-09-25 DIAGNOSIS — G4733 Obstructive sleep apnea (adult) (pediatric): Secondary | ICD-10-CM | POA: Diagnosis not present

## 2015-10-02 DIAGNOSIS — H5210 Myopia, unspecified eye: Secondary | ICD-10-CM | POA: Diagnosis not present

## 2015-10-02 DIAGNOSIS — H5200 Hypermetropia, unspecified eye: Secondary | ICD-10-CM | POA: Diagnosis not present

## 2015-10-20 ENCOUNTER — Encounter (HOSPITAL_BASED_OUTPATIENT_CLINIC_OR_DEPARTMENT_OTHER): Payer: Commercial Managed Care - HMO

## 2015-10-20 ENCOUNTER — Ambulatory Visit (INDEPENDENT_AMBULATORY_CARE_PROVIDER_SITE_OTHER): Payer: Commercial Managed Care - HMO | Admitting: Pulmonary Disease

## 2015-10-20 ENCOUNTER — Encounter (HOSPITAL_COMMUNITY): Payer: Self-pay | Admitting: Hematology & Oncology

## 2015-10-20 ENCOUNTER — Encounter (HOSPITAL_COMMUNITY): Payer: Commercial Managed Care - HMO | Attending: Hematology & Oncology | Admitting: Hematology & Oncology

## 2015-10-20 ENCOUNTER — Encounter: Payer: Self-pay | Admitting: Pulmonary Disease

## 2015-10-20 ENCOUNTER — Other Ambulatory Visit (HOSPITAL_COMMUNITY): Payer: Self-pay | Admitting: Hematology & Oncology

## 2015-10-20 VITALS — BP 142/60 | HR 83 | Temp 98.5°F | Resp 18 | Wt 242.0 lb

## 2015-10-20 VITALS — BP 132/66 | HR 85 | Temp 98.8°F | Ht 65.5 in | Wt 244.2 lb

## 2015-10-20 DIAGNOSIS — Z170 Estrogen receptor positive status [ER+]: Secondary | ICD-10-CM | POA: Diagnosis not present

## 2015-10-20 DIAGNOSIS — R05 Cough: Secondary | ICD-10-CM

## 2015-10-20 DIAGNOSIS — R053 Chronic cough: Secondary | ICD-10-CM

## 2015-10-20 DIAGNOSIS — Z79899 Other long term (current) drug therapy: Secondary | ICD-10-CM | POA: Insufficient documentation

## 2015-10-20 DIAGNOSIS — C50411 Malignant neoplasm of upper-outer quadrant of right female breast: Secondary | ICD-10-CM

## 2015-10-20 DIAGNOSIS — M8580 Other specified disorders of bone density and structure, unspecified site: Secondary | ICD-10-CM | POA: Diagnosis not present

## 2015-10-20 DIAGNOSIS — C50919 Malignant neoplasm of unspecified site of unspecified female breast: Secondary | ICD-10-CM | POA: Diagnosis not present

## 2015-10-20 DIAGNOSIS — C50911 Malignant neoplasm of unspecified site of right female breast: Secondary | ICD-10-CM | POA: Diagnosis not present

## 2015-10-20 DIAGNOSIS — E559 Vitamin D deficiency, unspecified: Secondary | ICD-10-CM

## 2015-10-20 DIAGNOSIS — Z853 Personal history of malignant neoplasm of breast: Secondary | ICD-10-CM

## 2015-10-20 LAB — COMPREHENSIVE METABOLIC PANEL
ALT: 21 U/L (ref 14–54)
AST: 26 U/L (ref 15–41)
Albumin: 4.3 g/dL (ref 3.5–5.0)
Alkaline Phosphatase: 58 U/L (ref 38–126)
Anion gap: 9 (ref 5–15)
BUN: 13 mg/dL (ref 6–20)
CHLORIDE: 107 mmol/L (ref 101–111)
CO2: 26 mmol/L (ref 22–32)
Calcium: 9.2 mg/dL (ref 8.9–10.3)
Creatinine, Ser: 0.71 mg/dL (ref 0.44–1.00)
Glucose, Bld: 81 mg/dL (ref 65–99)
POTASSIUM: 4.1 mmol/L (ref 3.5–5.1)
SODIUM: 142 mmol/L (ref 135–145)
Total Bilirubin: 0.7 mg/dL (ref 0.3–1.2)
Total Protein: 7.1 g/dL (ref 6.5–8.1)

## 2015-10-20 LAB — CBC WITH DIFFERENTIAL/PLATELET
Basophils Absolute: 0 10*3/uL (ref 0.0–0.1)
Basophils Relative: 0 %
EOS ABS: 0.2 10*3/uL (ref 0.0–0.7)
Eosinophils Relative: 4 %
HCT: 37.8 % (ref 36.0–46.0)
Hemoglobin: 12.6 g/dL (ref 12.0–15.0)
Lymphocytes Relative: 20 %
Lymphs Abs: 1.1 10*3/uL (ref 0.7–4.0)
MCH: 32.1 pg (ref 26.0–34.0)
MCHC: 33.3 g/dL (ref 30.0–36.0)
MCV: 96.4 fL (ref 78.0–100.0)
MONO ABS: 0.5 10*3/uL (ref 0.1–1.0)
Monocytes Relative: 9 %
Neutro Abs: 3.5 10*3/uL (ref 1.7–7.7)
Neutrophils Relative %: 67 %
Platelets: 222 10*3/uL (ref 150–400)
RBC: 3.92 MIL/uL (ref 3.87–5.11)
RDW: 13.4 % (ref 11.5–15.5)
WBC: 5.3 10*3/uL (ref 4.0–10.5)

## 2015-10-20 NOTE — Progress Notes (Signed)
Subjective:    Patient ID: Crystal [name], female    DOB: January 31, 1941, 74 y.o.   MRN: [id number] PROBLEM LIST Chronic cough Post nasal drip OSA on CPAP  HPI  Crystal [name] is a 74 year old with past medical history as below. Complaints of chronic cough for several years. This is nonproductive in nature not associated with shortness of breath, wheezing, chest pain, sputum production, hemoptysis. She has significant issues with her sinusitis and postnasal drip. She states that she can feel the drip at the back of her throat with irritation and constant throat clearing. She also has history of GERD and she is on Protonix. The symptoms were exacerbated on a trip to the beach 2 months ago. She saw her primary care physician who gave her a Z pack. She also got a chest x-ray. I do not have the report with me but is reportedly normal as per the patient. She is also on a Flonase nasal spray which was started a month ago. This has apparently not helped with her symptoms. She is on Cozaar for her blood pressure medication. But she is unable to state if her symptoms of cough began when Cozaar was started.  She has history of OSA on CPAP. No history of allergies or exposures. She is a never smoker and does not drink alcohol.   Interim history: At the last visit she was started on Advair and her PPI and Flonase increased.he reports that her cough has improved but not gone away completely. She still denies any dyspnea, cough, sputum production, fevers, chills.  DATA: CXR (09/17/15) No radiographic evidence of acute cardiopulmonary disease.   Past Medical History  Diagnosis Date  . Colitis, ischemic ([place]) 08/2007, 05/2011    Last colonoscopy/bx by Dr Rourk->(08/31/07) descending colon  . Diverticulosis   . Tubular adenoma of colon 08/2007  . HTN (hypertension)   . Depression   . GERD (gastroesophageal reflux disease)   . Chronic fatigue   . OA (osteoarthritis)   . Spinal stenosis   . Sleep apnea     cpap    . Obesity   . PONV (postoperative nausea and vomiting)   . Enlarged thyroid   . Breast cancer ([place]) 03/21/15    right     Current outpatient prescriptions:  .  acetaminophen (TYLENOL ARTHRITIS PAIN) 650 MG CR tablet, Take 1,300 mg by mouth 2 (two) times daily. , Disp: , Rfl:  .  amLODipine (NORVASC) 10 MG tablet, Take 10 mg by mouth daily. , Disp: , Rfl:  .  anastrozole (ARIMIDEX) 1 MG tablet, Take 1 tablet (1 mg total) by mouth daily., Disp: 30 tablet, Rfl: 2 .  aspirin EC 81 MG tablet, Take 81 mg by mouth daily.  , Disp: , Rfl:  .  buPROPion (WELLBUTRIN SR) 150 MG 12 hr tablet, Take 150 mg by mouth 2 (two) times daily. , Disp: , Rfl:  .  cycloSPORINE (RESTASIS) 0.05 % ophthalmic emulsion, Place 1 drop into both eyes 2 (two) times daily. For dry eyes, Disp: , Rfl:  .  FLUoxetine (PROZAC) 20 MG capsule, Take 60 mg by mouth daily. , Disp: , Rfl:  .  fluticasone (FLONASE) 50 MCG/ACT nasal spray, Place 2 sprays into both nostrils daily. , Disp: , Rfl:  .  Fluticasone-Salmeterol (ADVAIR DISKUS) 100-50 MCG/DOSE AEPB, Inhale 1 puff into the lungs 2 (two) times daily., Disp: 60 each, Rfl: 2 .  loratadine (CLARITIN) 10 MG tablet, Take 10 mg by mouth daily.  ,  Disp: , Rfl:  .  losartan-hydrochlorothiazide (HYZAAR) 100-25 MG per tablet, Take 1 tablet by mouth daily., Disp: , Rfl:  .  pantoprazole (PROTONIX) 40 MG tablet, Take 1 tablet (40 mg total) by mouth 2 (two) times daily., Disp: 60 tablet, Rfl: 2 .  simvastatin (ZOCOR) 10 MG tablet, Take 10 mg by mouth daily., Disp: , Rfl:  .  Vitamin D, Ergocalciferol, (DRISDOL) 50000 UNITS CAPS capsule, TAKE 1 CAPSULE BY MOUTH ONCE A WEEK, Disp: 12 capsule, Rfl: 1 .  HYDROMET 5-1.5 MG/5ML syrup, Take 5 mLs by mouth 2 (two) times daily as needed for cough. , Disp: , Rfl:   Review of Systems Review of Systems  Constitutional: Negative for fever, chills and unexpected weight change.  HENT: Positive for postnasal drip. Negative for congestion, dental problem,  ear pain, nosebleeds, rhinorrhea, sinus pressure, sneezing, sore throat, trouble swallowing and voice change.  Eyes: Negative for visual disturbance.  Respiratory: Positive for cough. Negative for choking and shortness of breath.  Cardiovascular: Negative for chest pain and leg swelling.  Gastrointestinal: Negative for vomiting, abdominal pain and diarrhea.  Genitourinary: Negative for difficulty urinating.  Musculoskeletal: Negative for arthralgias.  Skin: Negative for rash.  Neurological: Negative for tremors, syncope and headaches.  Hematological: Does not bruise/bleed easily.     Objective:   Physical Exam  Blood pressure 132/66, pulse 85, temperature 98.8 F (37.1 C), temperature source Oral, height 5' 5.5" (1.664 m), weight 244 lb 3.2 oz (110.768 kg), SpO2 95 %.  Constitutional: She is oriented to person, place, and time. She appears well-developed and well-nourished.  HENT:  Head: Normocephalic.  Eyes: Conjunctivae are normal. Pupils are equal, round, and reactive to light.  Neck: Normal range of motion. Neck supple.  Cardiovascular: Regular rhythm.  Pulmonary/Chest: Effort normal and breath sounds normal.  Abdominal: Soft. Bowel sounds are normal.  Neurological: She is alert and oriented to person, place, and time.  Skin: Skin is warm and dry.     Assessment & Plan:  #1 Chronic cough. She probably has reactive airway disease/cough variant asthma. She does have significant sinus issues and postnasal drip that could be causing her cough. She also has GERD that could be contributing as well. She is on losartan which is associated with cough (although less than ACEIs)  She'll continue her current treatment with Advair, higher dose of Flonase and Protonix. She has already seen an improvement in her symptoms. We will continue to monitor this. If symptoms worsen then we can consider an ENT eval or trying to stop her losartan. I'll also get lung function tests with a  bronchodilator response.  #2 OSA. She is on CPAP treatment for the past 12 years. But never has been reevaluated. She states that her cough got worse when she got a new CPAP machine. She is scheduled for repeat sleep study in December.  Plan: - Continue Flonase, Protonix, Advair. - Pulmonary function tests. - Sleep study.  Return in 6 month.  Marshell Garfinkel MD [place] [facility] Pager [phone] If no answer or after 3pm call: [phone] 10/20/2015, 1:57 PM

## 2015-10-20 NOTE — Patient Instructions (Signed)
[place] at [facility] Discharge Instructions  RECOMMENDATIONS MADE BY THE CONSULTANT AND ANY TEST RESULTS WILL BE SENT TO YOUR REFERRING PHYSICIAN.  Exam completed by Dr Whitney Muse today Continue prolia as scheduled Return to see the doctor in 3 months with lab appt Mammogram scheduled Please call the clinic if you have any questions or concerns  Thank you for choosing [place] at [facility] to provide your oncology and hematology care.  To afford each patient quality time with our provider, please arrive at least 15 minutes before your scheduled appointment time.    You need to re-schedule your appointment should you arrive 10 or more minutes late.  We strive to give you quality time with our providers, and arriving late affects you and other patients whose appointments are after yours.  Also, if you no show three or more times for appointments you may be dismissed from the clinic at the providers discretion.     Again, thank you for choosing [facility].  Our hope is that these requests will decrease the amount of time that you wait before being seen by our physicians.       _____________________________________________________________  Should you have questions after your visit to [facility], please contact our office at [phone] between the hours of 8:30 a.m. and 4:30 p.m.  Voicemails left after 4:30 p.m. will not be returned until the following business day.  For prescription refill requests, have your pharmacy contact our office.

## 2015-10-20 NOTE — Progress Notes (Signed)
[place] Progress Note  Patient Care Team: Asencion Noble, MD as PCP - General (Internal Medicine) Daneil Dolin, MD (Gastroenterology)  CHIEF COMPLAINTS/PURPOSE OF CONSULTATION:  Right Partial mastectomy with needle localization and axillary sentinel node on 03/18/2015 with Dr. 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   04/30/2015 - 06/05/2015 Radiation Therapy Right breast 50 Gy at 2 Gy/fraction x 25 fractions.  Dr. Pablo Ledger   06/08/2015 Imaging DEXA osteopenia     HISTORY OF PRESENTING ILLNESS:  Crystal [name] 74 y.o. 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 She has done well from a surgical perspective. She denies fever or chills. She denies any pain. She has completed XRT.  The patient states that she has been doing well on her Arimidex.  She has already had her influenza vaccination.  She has consulted a Pulmonologist due to excessive dry coughing causing discomfort in her throat and nausea.  They have increased her Omeprazole.  She has another follow up appointment today.  She notes that she has been experiencing this cough for the past year, but the condition has worsened recently.  She has not yet been referred to ENT.  Denies fevers, heartburn, blood in stool, problems with bowels.  She has no other concerns at this time.  CXR was done on 09/18/2015 and showed cardiomegaly with LVH, no acute changes.  She denies change in her energy or activity level. No new pain.   MEDICAL HISTORY:  Past Medical History  Diagnosis Date  . Colitis, ischemic ([place]) 08/2007, 05/2011    Last colonoscopy/bx by Dr Rourk->(08/31/07) descending colon  .  Diverticulosis   . Tubular adenoma of colon 08/2007  . HTN (hypertension)   . Depression   . GERD (gastroesophageal reflux disease)   . Chronic fatigue   . OA (osteoarthritis)   . Spinal stenosis   . Sleep apnea     cpap  . Obesity   . PONV (postoperative nausea and vomiting)   . Enlarged thyroid   . Breast cancer ([place]) 03/21/15    right    SURGICAL HISTORY: Past Surgical History  Procedure Laterality Date  . Tubal ligation  1970    hysterectomy  . Abdominal hysterectomy      partial  . Breast cyst excision      benign, left x2, right x1  . Cataract extraction w/phaco  12/01/2011    Procedure: CATARACT EXTRACTION PHACO AND INTRAOCULAR LENS PLACEMENT (IOC);  Surgeon: Tonny Branch;  Location: [facility];  Service: Ophthalmology;  Laterality: Right;  CDE=14.87  . Cataract extraction w/phaco  02/06/2012    Procedure: CATARACT EXTRACTION PHACO AND INTRAOCULAR LENS PLACEMENT (IOC);  Surgeon: Tonny Branch, MD;  Location: [facility];  Service: Ophthalmology;  Laterality: Left;  CDE 13.00  . Colonoscopy  Sept 2008    Last colonoscopy/bx by Dr Rourk->(08/31/07) descending colon TUBULAR ADENOMA  . Colonoscopy  10/25/2012    Procedure: COLONOSCOPY;  Surgeon: Daneil Dolin, MD;  Location: [facility];  Service: Endoscopy;  Laterality: N/A;  10:15-changed to 10:30 Darius Bump notified  . Partial mastectomy with needle localization and axillary sentinel lymph node bx Right 03/18/2015  Procedure: PARTIAL MASTECTOMY AFTER NEEDLE LOCALIZATION AND AXILLARY SENTINEL LYMPH NODE BX;  Surgeon: Aviva Signs Md, MD;  Location: [facility];  Service: General;  Laterality: Right;    SOCIAL HISTORY: Social History   Social History  . Marital Status: Widowed    Spouse Name: N/A  . Number of Children: 2  . Years of Education: N/A   Occupational History  . Lab Tech-retired    Social History Main Topics  . Smoking status: Never Smoker   . Smokeless tobacco: Never Used  . Alcohol Use: No  . Drug Use: No  . Sexual  Activity: No   Other Topics Concern  . Not on file   Social History Narrative   she has 3 children. She is widowed, her husband died from prostate cancer 6 years ago. They were married for 49 years. She has 5 grandchildren. She was born here in [place]. She is a nonsmoker, never smoker. She does not drink alcohol. She enjoys reading, puzzles, and playing solitaire  FAMILY HISTORY: Family History  Problem Relation Age of Onset  . Cancer Brother 40  . Coronary artery disease Brother   . Diabetes Sister   . Anesthesia problems Neg Hx   . Hypotension Neg Hx   . Pseudochol deficiency Neg Hx   . Malignant hyperthermia Neg Hx   . Colon cancer Neg Hx   . Heart disease Father   . Heart disease Brother    indicated that only one of her two brothers is alive.  2 brothers are deceased, one at the age of 31 from metastatic cancer of unknown primary. A second brother died from complications of heart disease. He was 77. She has one sister who is alive and has arthritis but is otherwise healthy. Her mother died at the age of 78 from age-related causes. Father died at 11 from complications of heart disease.   ALLERGIES:  has No Known Allergies.  MEDICATIONS:  Current Outpatient Prescriptions  Medication Sig Dispense Refill  . acetaminophen (TYLENOL ARTHRITIS PAIN) 650 MG CR tablet Take 1,300 mg by mouth 2 (two) times daily.     Marland Kitchen amLODipine (NORVASC) 10 MG tablet Take 10 mg by mouth daily.     Marland Kitchen anastrozole (ARIMIDEX) 1 MG tablet Take 1 tablet (1 mg total) by mouth daily. 30 tablet 2  . aspirin EC 81 MG tablet Take 81 mg by mouth daily.      Marland Kitchen buPROPion (WELLBUTRIN SR) 150 MG 12 hr tablet Take 150 mg by mouth 2 (two) times daily.     . cycloSPORINE (RESTASIS) 0.05 % ophthalmic emulsion Place 1 drop into both eyes 2 (two) times daily as needed. For dry eyes    . FLUoxetine (PROZAC) 20 MG capsule Take 60 mg by mouth daily.     . fluticasone (FLONASE) 50 MCG/ACT nasal spray Place 2 sprays into  both nostrils daily.     . Fluticasone-Salmeterol (ADVAIR DISKUS) 100-50 MCG/DOSE AEPB Inhale 1 puff into the lungs 2 (two) times daily. 60 each 2  . HYDROMET 5-1.5 MG/5ML syrup Take 5 mLs by mouth 2 (two) times daily as needed for cough.     . loratadine (CLARITIN) 10 MG tablet Take 10 mg by mouth daily.      Marland Kitchen losartan-hydrochlorothiazide (HYZAAR) 100-25 MG per tablet Take 1 tablet by mouth daily.    . pantoprazole (PROTONIX) 40 MG tablet Take 1 tablet (40 mg total) by mouth 2 (two) times daily. 60 tablet 2  . simvastatin (ZOCOR) 10  MG tablet Take 10 mg by mouth daily.    . Vitamin D, Ergocalciferol, (DRISDOL) 50000 UNITS CAPS capsule TAKE 1 CAPSULE BY MOUTH ONCE A WEEK 12 capsule 1   No current facility-administered medications for this visit.    Review of Systems  Constitutional: Negative for fever, chills, weight loss and malaise/fatigue.  HENT: Negative for congestion, hearing loss, nosebleeds, sore throat and tinnitus.   Eyes: Negative for blurred vision, double vision, pain and discharge.  Respiratory: Negative for cough, hemoptysis, sputum production, shortness of breath and wheezing.   Cardiovascular: Negative for chest pain, palpitations, claudication, leg swelling and PND.  Gastrointestinal: Negative for heartburn, nausea, vomiting, abdominal pain, diarrhea, constipation, blood in stool and melena.  Genitourinary: Negative for dysuria, urgency, frequency and hematuria.  Musculoskeletal: Positive for joint pain. Negative for myalgias and falls.  Arthritis. Skin: Negative for itching and rash.  Neurological: Negative for dizziness, tingling, tremors, sensory change, speech change, focal weakness, seizures, loss of consciousness, weakness and headaches.  Endo/Heme/Allergies: Does not bruise/bleed easily.  Psychiatric/Behavioral: Negative for depression, suicidal ideas, memory loss and substance abuse. The patient is not nervous/anxious and does not have insomnia.     PHYSICAL  EXAMINATION:  ECOG PERFORMANCE STATUS: 0 - Asymptomatic  Filed Vitals:   10/20/15 1100  BP: 142/60  Pulse: 83  Temp: 98.5 F (36.9 C)  Resp: 18   Filed Weights   10/20/15 1100  Weight: 242 lb (109.77 kg)   Physical Exam  Constitutional: She is oriented to person, place, and time and well-developed, well-nourished, and in no distress.  Obese  HENT:  Head: Normocephalic and atraumatic.  Nose: Nose normal.  Mouth/Throat: Oropharynx is clear and moist. No oropharyngeal exudate.  Eyes: Conjunctivae and EOM are normal. Pupils are equal, round, and reactive to light. Right eye exhibits no discharge. Left eye exhibits no discharge. No scleral icterus.  Neck: Normal range of motion. Neck supple. No tracheal deviation present. No thyromegaly present.  Cardiovascular: Normal rate, regular rhythm and normal heart sounds.  Exam reveals no gallop and no friction rub.   No murmur heard. Pulmonary/Chest: Effort normal and breath sounds normal. She has no wheezes. She has no rales.   Breast:  Right breast is slightly pink from radiation, incision site is at the 9 o'clock position and is well healed. Abdominal: Soft. Bowel sounds are normal. She exhibits no distension and no mass. There is no tenderness. There is no rebound and no guarding.  Musculoskeletal: Normal range of motion. She exhibits no edema.  Lymphadenopathy:    She has no cervical adenopathy.  Neurological: She is alert and oriented to person, place, and time. She has normal reflexes. No cranial nerve deficit. Gait normal. Coordination normal.  Skin: Skin is warm and dry. No rash noted.  Psychiatric: Mood, memory, affect and judgment normal.  Nursing note and vitals reviewed.   LABORATORY DATA:  I have reviewed the data as listed Lab Results  Component Value Date   WBC 5.3 10/20/2015   HGB 12.6 10/20/2015   HCT 37.8 10/20/2015   MCV 96.4 10/20/2015   PLT 222 10/20/2015     Chemistry      Component Value Date/Time   NA  142 10/20/2015 0931   K 4.1 10/20/2015 0931   CL 107 10/20/2015 0931   CO2 26 10/20/2015 0931   BUN 13 10/20/2015 0931   CREATININE 0.71 10/20/2015 0931      Component Value Date/Time   CALCIUM 9.2 10/20/2015 0931   ALKPHOS 58  10/20/2015 0931   AST 26 10/20/2015 0931   ALT 21 10/20/2015 0931   BILITOT 0.7 10/20/2015 0931       RADIOGRAPHIC STUDIES: I have personally reviewed the radiological images as listed and agreed with the findings in the report.  EXAM: DUAL X-RAY ABSORPTIOMETRY (DXA) FOR BONE MINERAL DENSITY  IMPRESSION: Ordering Physician: Dr. Patrici Ranks, ASSESSMENT: BMD as determined from Femur Neck Right is 0.843 g/cm2 with a T-Score of -1.4. This patient is considered osteopenic according to [facility] criteria. Per official position of the ISCD, it is not possible to quantitatively compare BMD or calculate a LSC between different facilities. (Lumbar spine was not utilized due to advanced degenerative changes.) See FRAX report on next page.  CLINICAL DATA: 74 year old female with history of chronic cough.  EXAM: CHEST 2 VIEW  COMPARISON: Chest x-ray 11/24/2014.  FINDINGS: Lung volumes are normal. No consolidative airspace disease. No pleural effusions. Mild linear scarring in the lingula is unchanged compared to prior examinations. No definite suspicious appearing pulmonary nodules or masses. No pneumothorax. No evidence of pulmonary edema. Heart size is mildly enlarged with prominence of the left ventricular contour, indicative of left ventricular hypertrophy. Upper mediastinal contours are within normal limits. Atherosclerosis in the thoracic aorta.  IMPRESSION: 1. No radiographic evidence of acute cardiopulmonary disease. 2. Atherosclerosis. 3. Cardiomegaly with evidence of left ventricular hypertrophy.   Electronically Signed  By: Vinnie Langton M.D.  On: 09/18/2015 08:06    ASSESSMENT & PLAN:    Stage I, ER+. 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 Recurrence score of 4 with 10 year risk of distant recurrence 5% with Tamoxifen alone Osteopenia Arimidex, calcium, vitamin D and Prolia  Pleasant 74 year old female with a stage I ER positive, PR positive, and HER-2 negative carcinoma of the breast. Largest tumor dimension was 0.9 cm.  She has completed XRT.She continues on arimidex 1 mg daily. She is doing well with no complaints.  I have ordered prolia, she is on calcium and vitamin D. She has excellent dental care. Risks and benefits of prolia were again discussed including risk of osteonecrosis of the jaw.  We will schedule for a routine follow up in 3 months. We will schedule for her to receive a Prolia shot before our next visit.  We will schedule her for her screening mammogram in January.   She has ongoing cough and takes losartan.  She will discuss this with her PCP at f/u.  All questions were answered. The patient knows to call the clinic with any problems, questions or concerns.  This note was electronically signed.    This document serves as a record of services personally performed by Ancil Linsey, MD. It was created on her behalf by Janace Hoard, a trained medical scribe. The creation of this record is based on the scribe's personal observations and the provider's statements to them. This document has been checked and approved by the attending provider.  I have reviewed the above documentation for accuracy and completeness, and I agree with the above.  Kelby Fam. Whitney Muse, MD

## 2015-10-20 NOTE — Addendum Note (Signed)
Addended by: Parke Poisson E on: 10/20/2015 02:01 PM   Modules accepted: Orders

## 2015-10-20 NOTE — Patient Instructions (Signed)
Please take your medications as prescribed. We will schedule a sleep test for you.  Return to clinic in 6 months

## 2015-10-20 NOTE — Progress Notes (Signed)
..  Crystal [name]'s reason for visit today are for labs as scheduled per MD orders.  Venipuncture performed with a 23 gauge butterfly needle to L Antecubital.  Crystal [name] tolerated venipuncture well and without incident; questions were answered and patient was discharged.

## 2015-10-21 LAB — VITAMIN D 25 HYDROXY (VIT D DEFICIENCY, FRACTURES): Vit D, 25-Hydroxy: 33 ng/mL (ref 30.0–100.0)

## 2015-11-09 DIAGNOSIS — K219 Gastro-esophageal reflux disease without esophagitis: Secondary | ICD-10-CM | POA: Diagnosis not present

## 2015-11-09 DIAGNOSIS — R05 Cough: Secondary | ICD-10-CM | POA: Diagnosis not present

## 2015-12-01 ENCOUNTER — Ambulatory Visit: Payer: Commercial Managed Care - HMO | Attending: Pulmonary Disease | Admitting: Sleep Medicine

## 2015-12-01 DIAGNOSIS — G4730 Sleep apnea, unspecified: Secondary | ICD-10-CM | POA: Diagnosis present

## 2015-12-01 DIAGNOSIS — G4733 Obstructive sleep apnea (adult) (pediatric): Secondary | ICD-10-CM | POA: Diagnosis not present

## 2015-12-03 DIAGNOSIS — Z6841 Body Mass Index (BMI) 40.0 and over, adult: Secondary | ICD-10-CM | POA: Diagnosis not present

## 2015-12-03 DIAGNOSIS — M545 Low back pain: Secondary | ICD-10-CM | POA: Diagnosis not present

## 2015-12-04 ENCOUNTER — Ambulatory Visit (HOSPITAL_COMMUNITY)
Admission: RE | Admit: 2015-12-04 | Discharge: 2015-12-04 | Disposition: A | Discharge status: Home / Self Care | Payer: Commercial Managed Care - HMO | Source: Ambulatory Visit | Attending: Internal Medicine | Admitting: Internal Medicine

## 2015-12-04 ENCOUNTER — Other Ambulatory Visit (HOSPITAL_COMMUNITY): Payer: Self-pay | Admitting: Internal Medicine

## 2015-12-04 DIAGNOSIS — M533 Sacrococcygeal disorders, not elsewhere classified: Secondary | ICD-10-CM

## 2015-12-04 DIAGNOSIS — M5136 Other intervertebral disc degeneration, lumbar region: Secondary | ICD-10-CM | POA: Diagnosis not present

## 2015-12-04 DIAGNOSIS — M4316 Spondylolisthesis, lumbar region: Secondary | ICD-10-CM | POA: Insufficient documentation

## 2015-12-07 DIAGNOSIS — K219 Gastro-esophageal reflux disease without esophagitis: Secondary | ICD-10-CM | POA: Diagnosis not present

## 2015-12-07 DIAGNOSIS — R05 Cough: Secondary | ICD-10-CM | POA: Diagnosis not present

## 2015-12-08 ENCOUNTER — Other Ambulatory Visit (HOSPITAL_COMMUNITY): Payer: Self-pay | Admitting: Oncology

## 2015-12-08 ENCOUNTER — Other Ambulatory Visit (HOSPITAL_COMMUNITY): Payer: Self-pay | Admitting: Hematology & Oncology

## 2015-12-08 DIAGNOSIS — E559 Vitamin D deficiency, unspecified: Secondary | ICD-10-CM

## 2015-12-08 MED ORDER — VITAMIN D (ERGOCALCIFEROL) 1.25 MG (50000 UNIT) PO CAPS: 50000.0000 [IU] | ORAL_CAPSULE | ORAL | Status: DC

## 2015-12-14 ENCOUNTER — Other Ambulatory Visit: Payer: Self-pay | Admitting: Pulmonary Disease

## 2015-12-19 NOTE — Sleep Study (Signed)
[place] A. Merlene Laughter, MD     [email or web address]             NOCTURNAL POLYSOMNOGRAPHY   LOCATION: [facility]  CLINICAL INFORMATION Sleep Study Type: NPSG Indication for sleep study: OSA Epworth Sleepiness Score: 4 SLEEP STUDY TECHNIQUE As per the AASM Manual for the Scoring of Sleep and Associated Events v2.3 (April 2016) with a hypopnea requiring 4% desaturations. The channels recorded and monitored were frontal, central and occipital EEG, electrooculogram (EOG), submentalis EMG (chin), nasal and oral airflow, thoracic and abdominal wall motion, anterior tibialis EMG, snore microphone, electrocardiogram, and pulse oximetry. MEDICATIONS Patient's medications include: N/A. Medications self-administered by patient during sleep study : No sleep medicine administered. Prior to Admission medications   Medication Sig Start Date End Date Taking? Authorizing Provider  acetaminophen (TYLENOL ARTHRITIS PAIN) 650 MG CR tablet Take 1,300 mg by mouth 2 (two) times daily.     Historical Provider, MD  ADVAIR DISKUS 100-50 MCG/DOSE AEPB INHALE 1 PUFF INTO THE LUNGS TWICE DAILY 12/14/15   Praveen Mannam, MD  amLODipine (NORVASC) 10 MG tablet Take 10 mg by mouth daily.  10/02/12   Historical Provider, MD  anastrozole (ARIMIDEX) 1 MG tablet Take 1 tablet (1 mg total) by mouth daily. 09/22/15   Baird Cancer, PA-C  aspirin EC 81 MG tablet Take 81 mg by mouth daily.      Historical Provider, MD  buPROPion (WELLBUTRIN SR) 150 MG 12 hr tablet Take 150 mg by mouth 2 (two) times daily.  06/02/11   Historical Provider, MD  cycloSPORINE (RESTASIS) 0.05 % ophthalmic emulsion Place 1 drop into both eyes 2 (two) times daily. For dry eyes    Historical Provider, MD  FLUoxetine (PROZAC) 20 MG capsule Take 60 mg by mouth daily.  06/23/11   Historical Provider, MD  fluticasone (FLONASE) 50 MCG/ACT nasal spray Place 2 sprays into both nostrils daily.     Historical Provider, MD  HYDROMET 5-1.5 MG/5ML  syrup Take 5 mLs by mouth 2 (two) times daily as needed for cough.  01/10/14   Historical Provider, MD  loratadine (CLARITIN) 10 MG tablet Take 10 mg by mouth daily.      Historical Provider, MD  losartan-hydrochlorothiazide (HYZAAR) 100-25 MG per tablet Take 1 tablet by mouth daily.    Historical Provider, MD  pantoprazole (PROTONIX) 40 MG tablet TAKE 1 TABLET BY MOUTH TWICE DAILY 12/14/15   Praveen Mannam, MD  simvastatin (ZOCOR) 10 MG tablet Take 10 mg by mouth daily.    Historical Provider, MD  Vitamin D, Ergocalciferol, (DRISDOL) 50000 UNITS CAPS capsule Take 1 capsule (50,000 Units total) by mouth once a week. 12/08/15   Baird Cancer, PA-C    SLEEP ARCHITECTURE The study was initiated at 10:59:19 PM and ended at 4:38:59 AM. Sleep onset time was 26.3 minutes and the sleep efficiency was 68.9%. The total sleep time was 234.0 minutes. Stage REM latency was 229.0 minutes. The patient spent 14.96% of the night in stage N1 sleep, 84.19% in stage N2 sleep, 0.00% in stage N3 and 0.85% in REM. Alpha intrusion was absent. Supine sleep was 73.56%. RESPIRATORY PARAMETERS The overall apnea/hypopnea index (AHI) was 28.2 per hour. There were 36 total apneas, including 33 obstructive, 0 central and 3 mixed apneas. There were 74 hypopneas and 0 RERAs. The AHI during Stage REM sleep was 90.0 per hour. AHI while supine was 23.4 per hour. The mean oxygen saturation was 92.17%. The minimum SpO2 during sleep was 81.00%. Loud snoring  was noted during this study. CARDIAC DATA The 2 lead EKG demonstrated sinus rhythm. The mean heart rate was N/A beats per minute. Other EKG findings include: None. LEG MOVEMENT DATA The total PLMS were 73 with a resulting PLMS index of 18.72. Associated arousal with leg movement index was 4.4.   IMPRESSIONS - Moderate obstructive sleep apnea. Suggest AutoPAP 10-20.  - Abnormal sleep architecture with significantly reduced REM and slow wave sleep.   Delano Metz,  MD Diplomate, American Board of Sleep Medicine.

## 2015-12-22 ENCOUNTER — Other Ambulatory Visit (HOSPITAL_COMMUNITY): Payer: Self-pay | Admitting: Oncology

## 2015-12-22 DIAGNOSIS — C50411 Malignant neoplasm of upper-outer quadrant of right female breast: Secondary | ICD-10-CM

## 2015-12-22 MED ORDER — ANASTROZOLE 1 MG PO TABS: 1.0000 mg | ORAL_TABLET | Freq: Every day | ORAL | Status: DC

## 2015-12-25 DIAGNOSIS — G4733 Obstructive sleep apnea (adult) (pediatric): Secondary | ICD-10-CM | POA: Diagnosis not present

## 2016-01-05 ENCOUNTER — Telehealth: Payer: Self-pay | Admitting: Pulmonary Disease

## 2016-01-05 DIAGNOSIS — G4733 Obstructive sleep apnea (adult) (pediatric): Secondary | ICD-10-CM

## 2016-01-05 NOTE — Telephone Encounter (Signed)
Pt is requesting the results of her sleep study. These results are in [place].  Dr. Vaughan Browner please advise. Thanks.

## 2016-01-06 NOTE — Telephone Encounter (Signed)
Sleep study confirms previous diagnosis of OSA. Reccs are for AutoPAP 10-20. Please order. Thanks.

## 2016-01-06 NOTE — Telephone Encounter (Signed)
Pt states that she has been on CPAP 8 for several years and does not feel that this needs to be changed. Pt states that during her sleep test, which was supposed to be a Split Night, she was not placed on CPAP. Pt wants to know if her pressure still needs to be changed to Auto 10-20 even if she is tolerating her current pressure well. Please advise Dr Vaughan Browner. Thanks.

## 2016-01-07 NOTE — Telephone Encounter (Signed)
I spoke with the patient. She is OK for the new CPAP only if the insurance covers it. Can we check on this?

## 2016-01-08 NOTE — Telephone Encounter (Signed)
Called spoke with patient - she said her machine is just a few years old but would like to see if insurance will pay for a new machine.  She stated that she does not know if there is anything wrong with it.  Pt is also amenable to trying the new CPAP pressure settings.  Orders placed Nothing further needed; will sign off

## 2016-01-13 ENCOUNTER — Telehealth: Payer: Self-pay | Admitting: Pulmonary Disease

## 2016-01-13 DIAGNOSIS — G4733 Obstructive sleep apnea (adult) (pediatric): Secondary | ICD-10-CM

## 2016-01-13 NOTE — Telephone Encounter (Signed)
Spoke with Crystal [name]. The pt is not considered a pt of [facility] yet. They are not servicing her machine so they need a basic order to be able to take over her machine. Crystal [name] asks that we place an order for CPAP supplies. This has been done. Nothing further was needed.

## 2016-01-20 ENCOUNTER — Encounter (HOSPITAL_COMMUNITY): Payer: Commercial Managed Care - HMO | Attending: Hematology & Oncology

## 2016-01-20 ENCOUNTER — Encounter (HOSPITAL_BASED_OUTPATIENT_CLINIC_OR_DEPARTMENT_OTHER): Payer: Commercial Managed Care - HMO

## 2016-01-20 ENCOUNTER — Encounter (HOSPITAL_COMMUNITY): Payer: Self-pay | Admitting: Hematology & Oncology

## 2016-01-20 ENCOUNTER — Encounter (HOSPITAL_COMMUNITY): Payer: Commercial Managed Care - HMO | Attending: Hematology & Oncology | Admitting: Hematology & Oncology

## 2016-01-20 VITALS — BP 142/51 | HR 80 | Temp 98.7°F | Resp 20 | Wt 244.0 lb

## 2016-01-20 DIAGNOSIS — D0511 Intraductal carcinoma in situ of right breast: Secondary | ICD-10-CM

## 2016-01-20 DIAGNOSIS — C50919 Malignant neoplasm of unspecified site of unspecified female breast: Secondary | ICD-10-CM | POA: Diagnosis not present

## 2016-01-20 DIAGNOSIS — M8580 Other specified disorders of bone density and structure, unspecified site: Secondary | ICD-10-CM

## 2016-01-20 LAB — CBC WITH DIFFERENTIAL/PLATELET
BASOS ABS: 0 10*3/uL (ref 0.0–0.1)
BASOS PCT: 0 %
EOS ABS: 0.3 10*3/uL (ref 0.0–0.7)
Eosinophils Relative: 6 %
HEMATOCRIT: 39.2 % (ref 36.0–46.0)
HEMOGLOBIN: 13.1 g/dL (ref 12.0–15.0)
Lymphocytes Relative: 24 %
Lymphs Abs: 1.1 10*3/uL (ref 0.7–4.0)
MCH: 32 pg (ref 26.0–34.0)
MCHC: 33.4 g/dL (ref 30.0–36.0)
MCV: 95.6 fL (ref 78.0–100.0)
MONOS PCT: 12 %
Monocytes Absolute: 0.5 10*3/uL (ref 0.1–1.0)
NEUTROS ABS: 2.6 10*3/uL (ref 1.7–7.7)
NEUTROS PCT: 58 %
Platelets: 224 10*3/uL (ref 150–400)
RBC: 4.1 MIL/uL (ref 3.87–5.11)
RDW: 13.3 % (ref 11.5–15.5)
WBC: 4.5 10*3/uL (ref 4.0–10.5)

## 2016-01-20 LAB — COMPREHENSIVE METABOLIC PANEL
ALBUMIN: 4.2 g/dL (ref 3.5–5.0)
ALK PHOS: 65 U/L (ref 38–126)
ALT: 21 U/L (ref 14–54)
ANION GAP: 9 (ref 5–15)
AST: 28 U/L (ref 15–41)
BILIRUBIN TOTAL: 0.7 mg/dL (ref 0.3–1.2)
BUN: 16 mg/dL (ref 6–20)
CALCIUM: 9.2 mg/dL (ref 8.9–10.3)
CO2: 24 mmol/L (ref 22–32)
CREATININE: 0.8 mg/dL (ref 0.44–1.00)
Chloride: 108 mmol/L (ref 101–111)
GFR calc Af Amer: >60 mL/min (ref >60)
GFR calc non Af Amer: >60 mL/min (ref >60)
GLUCOSE: 109 mg/dL — AB (ref 65–99)
Potassium: 4.2 mmol/L (ref 3.5–5.1)
SODIUM: 141 mmol/L (ref 135–145)
TOTAL PROTEIN: 7.2 g/dL (ref 6.5–8.1)

## 2016-01-20 MED ORDER — DENOSUMAB 60 MG/ML ~~LOC~~ SOLN
60.0000 mg | Freq: Once | SUBCUTANEOUS | Status: AC
  Administered 2016-01-20: 60 mg via SUBCUTANEOUS
  Filled 2016-01-20: qty 1

## 2016-01-20 NOTE — Progress Notes (Signed)
[facility] Progress Note  Patient Care Team: Crystal Noble, [name] as PCP - General (Internal Medicine) Crystal Dolin, [name] (Gastroenterology)  CHIEF COMPLAINTS/PURPOSE OF CONSULTATION:  Right Partial mastectomy with needle localization and axillary sentinel node on 03/18/2015 with Crystal. 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   04/30/2015 - 06/05/2015 Radiation Therapy Right breast 50 Gy at 2 Gy/fraction x 25 fractions.  Crystal [name]   06/08/2015 Imaging DEXA osteopenia     HISTORY OF PRESENTING ILLNESS:  Crystal [name] 75 y.o. 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 She has done well from a surgical perspective. She denies fever or chills. She denies any pain. She has completed XRT. She continues on arimidex, calcium and vitamin D. She is on prolia.  Crystal [name] returns to the [facility] alone today. She says that her Christmas was fine, that she had everyone loving on her.  In terms of how she's been feeling lately, she says "fine." She denies any problems. When asked about any issues with urination or any burning while she urinates, she denies any problems. She also denies any falls.  She says that her appetite is "too good" and denies any belly pain. She says that her breathing is okay and that she has not had any chest pain. She reports that her breathing has been okay.  She confirms that she takes her pill every day, along with her Calcium and Vitamin D, saying "I take everything on that list."  Recently, she says she was having regular coughing spells, this is now improved. She reports that the throat doctor put her on Gabapentin, and the  pulmonary doctor put her on Protonix and Advair.   She says she has another sleep test coming up, and confirms that she wears her CPAP every night.  Though she says she wishes a spot of skin on her chest was gone, she also remarks that she has seen enough doctors lately and doesn't want to see any more. She says that her PCP always checks it every time she goes in.  MEDICAL HISTORY:  Past Medical History  Diagnosis Date  . Colitis, ischemic ([place]) 08/2007, 05/2011    Last colonoscopy/bx by Crystal [name]->(08/31/07) descending colon  . Diverticulosis   . Tubular adenoma of colon 08/2007  . HTN (hypertension)   . Depression   . GERD (gastroesophageal reflux disease)   . Chronic fatigue   . OA (osteoarthritis)   . Spinal stenosis   . Sleep apnea     cpap  . Obesity   . PONV (postoperative nausea and vomiting)   . Enlarged thyroid   . Breast cancer ([place]) 03/21/15    right    SURGICAL HISTORY: Past Surgical History  Procedure Laterality Date  . Tubal ligation  1970    hysterectomy  . Abdominal hysterectomy      partial  . Breast cyst excision      benign, left x2, right x1  . Cataract extraction w/phaco  12/01/2011    Procedure: CATARACT EXTRACTION PHACO AND INTRAOCULAR LENS PLACEMENT (IOC);  Surgeon: Crystal [name];  Location: [facility];  Service: Ophthalmology;  Laterality: Right;  CDE=14.87  .  Cataract extraction w/phaco  02/06/2012    Procedure: CATARACT EXTRACTION PHACO AND INTRAOCULAR LENS PLACEMENT (IOC);  Surgeon: Crystal [name];  Location: [facility];  Service: Ophthalmology;  Laterality: Left;  CDE 13.00  . Colonoscopy  Sept 2008    Last colonoscopy/bx by Crystal [name]->(08/31/07) descending colon TUBULAR ADENOMA  . Colonoscopy  10/25/2012    Procedure: COLONOSCOPY;  Surgeon: Crystal Dolin, [name];  Location: [facility];  Service: Endoscopy;  Laterality: N/A;  10:15-changed to 10:30 Crystal [name] notified  . Partial mastectomy with needle localization and axillary sentinel lymph node bx Right 03/18/2015     Procedure: PARTIAL MASTECTOMY AFTER NEEDLE LOCALIZATION AND AXILLARY SENTINEL LYMPH NODE BX;  Surgeon: Crystal [name];  Location: [facility];  Service: General;  Laterality: Right;    SOCIAL HISTORY: Social History   Social History  . Marital Status: Widowed    Spouse Name: N/A  . Number of Children: 2  . Years of Education: N/A   Occupational History  . Lab Tech-retired    Social History Main Topics  . Smoking status: Never Smoker   . Smokeless tobacco: Never Used  . Alcohol Use: No  . Drug Use: No  . Sexual Activity: No   Other Topics Concern  . Not on file   Social History Narrative   she has 3 children. She is widowed, her husband died from prostate cancer 6 years ago. They were married for 49 years. She has 5 grandchildren. She was born here in [place]. She is a nonsmoker, never smoker. She does not drink alcohol. She enjoys reading, puzzles, and playing solitaire  FAMILY HISTORY: Family History  Problem Relation Age of Onset  . Cancer Brother 76  . Coronary artery disease Brother   . Diabetes Sister   . Anesthesia problems Neg Hx   . Hypotension Neg Hx   . Pseudochol deficiency Neg Hx   . Malignant hyperthermia Neg Hx   . Colon cancer Neg Hx   . Heart disease Father   . Heart disease Brother    indicated that only one of her two brothers is alive.  2 brothers are deceased, one at the age of 22 from metastatic cancer of unknown primary. A second brother died from complications of heart disease. He was 77. She has one sister who is alive and has arthritis but is otherwise healthy. Her mother died at the age of 43 from age-related causes. Father died at 67 from complications of heart disease.   ALLERGIES:  has No Known Allergies.  MEDICATIONS:  Current Outpatient Prescriptions  Medication Sig Dispense Refill  . acetaminophen (TYLENOL ARTHRITIS PAIN) 650 MG CR tablet Take 1,300 mg by mouth 2 (two) times daily.     Marland Kitchen ADVAIR DISKUS 100-50 MCG/DOSE AEPB  INHALE 1 PUFF INTO THE LUNGS TWICE DAILY 60 each 5  . amLODipine (NORVASC) 10 MG tablet Take 10 mg by mouth daily.     Marland Kitchen anastrozole (ARIMIDEX) 1 MG tablet Take 1 tablet (1 mg total) by mouth daily. 30 tablet 2  . aspirin EC 81 MG tablet Take 81 mg by mouth daily.      Marland Kitchen buPROPion (WELLBUTRIN SR) 150 MG 12 hr tablet Take 150 mg by mouth 2 (two) times daily.     . cycloSPORINE (RESTASIS) 0.05 % ophthalmic emulsion Place 1 drop into both eyes 2 (two) times daily. For dry eyes    . FLUoxetine (PROZAC) 20 MG capsule Take 60 mg by mouth daily.     Marland Kitchen  fluticasone (FLONASE) 50 MCG/ACT nasal spray Place 2 sprays into both nostrils daily.     Marland Kitchen gabapentin (NEURONTIN) 300 MG capsule Take 300 mg by mouth 3 (three) times daily.    Marland Kitchen HYDROMET 5-1.5 MG/5ML syrup Take 5 mLs by mouth 2 (two) times daily as needed for cough.     . loratadine (CLARITIN) 10 MG tablet Take 10 mg by mouth daily.      Marland Kitchen losartan-hydrochlorothiazide (HYZAAR) 100-25 MG per tablet Take 1 tablet by mouth daily.    . pantoprazole (PROTONIX) 40 MG tablet TAKE 1 TABLET BY MOUTH TWICE DAILY 60 tablet 5  . simvastatin (ZOCOR) 10 MG tablet Take 10 mg by mouth daily.    . Vitamin D, Ergocalciferol, (DRISDOL) 50000 UNITS CAPS capsule Take 1 capsule (50,000 Units total) by mouth once a week. 12 capsule 0   No current facility-administered medications for this visit.    Review of Systems  Constitutional: Negative for fever, chills, weight loss and malaise/fatigue.  HENT: Negative for congestion, hearing loss, nosebleeds, sore throat and tinnitus.   Eyes: Negative for blurred vision, double vision, pain and discharge.  Respiratory: Negative for cough, hemoptysis, sputum production, shortness of breath and wheezing.   Cardiovascular: Negative for chest pain, palpitations, claudication, leg swelling and PND.  Gastrointestinal: Negative for heartburn, nausea, vomiting, abdominal pain, diarrhea, constipation, blood in stool and melena.    Genitourinary: Negative for dysuria, urgency, frequency and hematuria.  Musculoskeletal: Positive for joint pain. Negative for myalgias and falls.  Arthritis. Skin: Negative for itching and rash.  Neurological: Negative for dizziness, tingling, tremors, sensory change, speech change, focal weakness, seizures, loss of consciousness, weakness and headaches.  Endo/Heme/Allergies: Does not bruise/bleed easily.  Psychiatric/Behavioral: Negative for depression, suicidal ideas, memory loss and substance abuse. The patient is not nervous/anxious and does not have insomnia.    14 point review of systems was performed and is negative except as detailed under history of present illness and above   PHYSICAL EXAMINATION:  ECOG PERFORMANCE STATUS: 0 - Asymptomatic  Filed Vitals:   01/20/16 1130  BP: 142/51  Pulse: 80  Temp: 98.7 F (37.1 C)  Resp: 20   Filed Weights   01/20/16 1130  Weight: 244 lb (110.678 kg)   Physical Exam  Constitutional: She is oriented to person, place, and time and well-developed, well-nourished, and in no distress.  Obese  HENT:  Head: Normocephalic and atraumatic.  Nose: Nose normal.  Mouth/Throat: Oropharynx is clear and moist. No oropharyngeal exudate.  Eyes: Conjunctivae and EOM are normal. Pupils are equal, round, and reactive to light. Right eye exhibits no discharge. Left eye exhibits no discharge. No scleral icterus.  Neck: Normal range of motion. Neck supple. No tracheal deviation present. No thyromegaly present.  Cardiovascular: Normal rate, regular rhythm and normal heart sounds.  Exam reveals no gallop and no friction rub.   No murmur heard. Pulmonary/Chest: Effort normal and breath sounds normal. She has no wheezes. She has no rales.   Abdominal: Soft. Bowel sounds are normal. She exhibits no distension and no mass. There is no tenderness. There is no rebound and no guarding.  Musculoskeletal: Normal range of motion. She exhibits no edema.   Lymphadenopathy:    She has no cervical adenopathy.  Neurological: She is alert and oriented to person, place, and time. She has normal reflexes. No cranial nerve deficit. Gait normal. Coordination normal.  Skin: Skin is warm and dry. No rash noted.  Psychiatric: Mood, memory, affect and judgment normal.  Nursing note and vitals reviewed.     LABORATORY DATA:  I have reviewed the data as listed Lab Results  Component Value Date   WBC 4.5 01/20/2016   HGB 13.1 01/20/2016   HCT 39.2 01/20/2016   MCV 95.6 01/20/2016   PLT 224 01/20/2016     Chemistry      Component Value Date/Time   NA 141 01/20/2016 1009   K 4.2 01/20/2016 1009   CL 108 01/20/2016 1009   CO2 24 01/20/2016 1009   BUN 16 01/20/2016 1009   CREATININE 0.80 01/20/2016 1009      Component Value Date/Time   CALCIUM 9.2 01/20/2016 1009   ALKPHOS 65 01/20/2016 1009   AST 28 01/20/2016 1009   ALT 21 01/20/2016 1009   BILITOT 0.7 01/20/2016 1009       RADIOGRAPHIC STUDIES: I have personally reviewed the radiological images as listed and agreed with the findings in the report.  EXAM: DUAL X-RAY ABSORPTIOMETRY (DXA) FOR BONE MINERAL DENSITY  IMPRESSION: Ordering Physician: Crystal. Patrici Ranks, ASSESSMENT: BMD as determined from Femur Neck Right is 0.843 g/cm2 with a T-Score of -1.4. This patient is considered osteopenic according to [facility]) criteria. Per official position of the ISCD, it is not possible to quantitatively compare BMD or calculate a LSC between different facilities. (Lumbar spine was not utilized due to advanced degenerative changes.) See FRAX report on next page.  CLINICAL DATA: 75 year old female with history of chronic cough.  EXAM: CHEST 2 VIEW  COMPARISON: Chest x-ray 11/24/2014.  FINDINGS: Lung volumes are normal. No consolidative airspace disease. No pleural effusions. Mild linear scarring in the lingula is unchanged compared to prior  examinations. No definite suspicious appearing pulmonary nodules or masses. No pneumothorax. No evidence of pulmonary edema. Heart size is mildly enlarged with prominence of the left ventricular contour, indicative of left ventricular hypertrophy. Upper mediastinal contours are within normal limits. Atherosclerosis in the thoracic aorta.  IMPRESSION: 1. No radiographic evidence of acute cardiopulmonary disease. 2. Atherosclerosis. 3. Cardiomegaly with evidence of left ventricular hypertrophy.   Electronically Signed  By: Vinnie Langton M.D.  On: 09/18/2015 08:06    ASSESSMENT & PLAN:   Stage I, ER+. 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 Recurrence score of 4 with 10 year risk of distant recurrence 5% with Tamoxifen alone Osteopenia Arimidex, calcium, vitamin D and Prolia  Pleasant 75 year old female with a stage I ER positive, PR positive, and HER-2 negative carcinoma of the breast. Largest tumor dimension was 0.9 cm.  She continues on arimidex 1 mg daily. She is doing well with no complaints.  I have ordered prolia, she is on calcium and vitamin D. She has excellent dental care. She is due for prolia today. Risks and benefits of prolia were again discussed including risk of osteonecrosis of the jaw.  She will have a mammogram at the end of this month. We will see her back again in 3 months.  Next time she comes in, we will do a breast exam.   Orders Placed This Encounter  Procedures  . CBC with Differential    Standing Status: Future     Number of Occurrences: 1     Standing Expiration Date: 01/19/2017  . Comprehensive metabolic panel    Standing Status: Future     Number of Occurrences: 1     Standing Expiration Date: 01/19/2017  . Comprehensive metabolic panel  Standing Status: Standing     Number of Occurrences: 6     Standing Expiration Date: 01/19/2017   All questions were answered.  The patient knows to call the clinic with any problems, questions or concerns.    This document serves as a record of services personally performed by Ancil Linsey, [name]. It was created on her behalf by Toni Amend, a trained medical scribe. The creation of this record is based on the scribe's personal observations and the provider's statements to them. This document has been checked and approved by the attending provider.  I have reviewed the above documentation for accuracy and completeness, and I agree with the above.  This note was electronically signed.   K[place]by Fam. Whitney Muse, [name]

## 2016-01-20 NOTE — Progress Notes (Signed)
..  Crystal [name] presents today for injection per the provider's orders.  prolia administration without incident; see MAR for injection details.  Patient tolerated procedure well and without incident.  No questions or complaints noted at this time.

## 2016-01-20 NOTE — Patient Instructions (Signed)
Follow instructions from MD exam

## 2016-01-20 NOTE — Patient Instructions (Addendum)
[facility] at [facility] Discharge Instructions  RECOMMENDATIONS MADE BY THE CONSULTANT AND ANY TEST RESULTS WILL BE SENT TO YOUR REFERRING PHYSICIAN.   Exam and discussion completed by Dr Whitney Muse today Prolia today Prolia every 6 months Next appt we will do a clinical breast exam Return to see the doctor in 3 months  You stay on the arimidex for 5 years Please call the clinic if you have any questions or concerns    Thank you for choosing [place] at [facility] to provide your oncology and hematology care.  To afford each patient quality time with our provider, please arrive at least 15 minutes before your scheduled appointment time.   Beginning January 23rd 2017 lab work for the [facility] will be done in the  Main lab at [facility] on 1st floor. If you have a lab appointment with the [facility] please come in thru the  Main Entrance and check in at the main information desk  You need to re-schedule your appointment should you arrive 10 or more minutes late.  We strive to give you quality time with our providers, and arriving late affects you and other patients whose appointments are after yours.  Also, if you no show three or more times for appointments you may be dismissed from the clinic at the providers discretion.     Again, thank you for choosing [facility].  Our hope is that these requests will decrease the amount of time that you wait before being seen by our physicians.       _____________________________________________________________  Should you have questions after your visit to [facility], please contact our office at [phone] between the hours of 8:30 a.m. and 4:30 p.m.  Voicemails left after 4:30 p.m. will not be returned until the following business day.  For prescription refill requests, have your pharmacy contact our office.

## 2016-01-26 ENCOUNTER — Encounter (HOSPITAL_COMMUNITY): Payer: Commercial Managed Care - HMO

## 2016-01-27 ENCOUNTER — Telehealth: Payer: Self-pay | Admitting: Pulmonary Disease

## 2016-01-27 DIAGNOSIS — G4733 Obstructive sleep apnea (adult) (pediatric): Secondary | ICD-10-CM

## 2016-01-27 NOTE — Telephone Encounter (Signed)
The new CPAP machines have the autoset settings and we can order it, do you want it to start at 5-15?  What setting do you want to use?

## 2016-01-27 NOTE — Telephone Encounter (Signed)
Can she get the new CPAP with autoset settings?

## 2016-01-27 NOTE — Telephone Encounter (Signed)
Called Melissa back from [facility].  Lenna Sciara says that she spoke with [employer] and found out that [employer] gave patient CPAP in 2011.  The CPAP that she has right now is not capable of Auto settings.  [facility] says that she can get a new CPAP machine, [employer] does not require repair process and she is eligible for new machine since it has been 6 years.    Dr. Vaughan Browner, please advise.

## 2016-01-28 DIAGNOSIS — E785 Hyperlipidemia, unspecified: Secondary | ICD-10-CM | POA: Diagnosis not present

## 2016-01-28 DIAGNOSIS — M1990 Unspecified osteoarthritis, unspecified site: Secondary | ICD-10-CM | POA: Diagnosis not present

## 2016-01-28 DIAGNOSIS — E049 Nontoxic goiter, unspecified: Secondary | ICD-10-CM | POA: Diagnosis not present

## 2016-01-28 DIAGNOSIS — E669 Obesity, unspecified: Secondary | ICD-10-CM | POA: Diagnosis not present

## 2016-01-28 DIAGNOSIS — K219 Gastro-esophageal reflux disease without esophagitis: Secondary | ICD-10-CM | POA: Diagnosis not present

## 2016-01-28 DIAGNOSIS — E119 Type 2 diabetes mellitus without complications: Secondary | ICD-10-CM | POA: Diagnosis not present

## 2016-01-29 ENCOUNTER — Ambulatory Visit (HOSPITAL_COMMUNITY): Payer: Commercial Managed Care - HMO

## 2016-01-29 ENCOUNTER — Other Ambulatory Visit (HOSPITAL_COMMUNITY): Payer: Self-pay | Admitting: Hematology & Oncology

## 2016-01-29 ENCOUNTER — Other Ambulatory Visit (HOSPITAL_COMMUNITY): Payer: Self-pay | Admitting: Oncology

## 2016-01-29 DIAGNOSIS — C50411 Malignant neoplasm of upper-outer quadrant of right female breast: Secondary | ICD-10-CM

## 2016-01-29 NOTE — Telephone Encounter (Signed)
Order placed for new cpap.  Nothing further needed.

## 2016-01-29 NOTE — Telephone Encounter (Signed)
Yes. CPAP 5-15. thanks

## 2016-02-03 ENCOUNTER — Encounter (HOSPITAL_COMMUNITY): Payer: Commercial Managed Care - HMO

## 2016-02-03 DIAGNOSIS — G4733 Obstructive sleep apnea (adult) (pediatric): Secondary | ICD-10-CM | POA: Diagnosis not present

## 2016-02-04 DIAGNOSIS — Z6841 Body Mass Index (BMI) 40.0 and over, adult: Secondary | ICD-10-CM | POA: Diagnosis not present

## 2016-02-04 DIAGNOSIS — I10 Essential (primary) hypertension: Secondary | ICD-10-CM | POA: Diagnosis not present

## 2016-02-04 DIAGNOSIS — Z0001 Encounter for general adult medical examination with abnormal findings: Secondary | ICD-10-CM | POA: Diagnosis not present

## 2016-02-09 ENCOUNTER — Ambulatory Visit (HOSPITAL_COMMUNITY)
Admission: RE | Admit: 2016-02-09 | Discharge: 2016-02-09 | Disposition: A | Discharge status: Home / Self Care | Payer: Commercial Managed Care - HMO | Source: Ambulatory Visit | Attending: Hematology & Oncology | Admitting: Hematology & Oncology

## 2016-02-09 DIAGNOSIS — C50411 Malignant neoplasm of upper-outer quadrant of right female breast: Secondary | ICD-10-CM | POA: Diagnosis not present

## 2016-02-09 DIAGNOSIS — R928 Other abnormal and inconclusive findings on diagnostic imaging of breast: Secondary | ICD-10-CM | POA: Diagnosis not present

## 2016-03-02 DIAGNOSIS — G4733 Obstructive sleep apnea (adult) (pediatric): Secondary | ICD-10-CM | POA: Diagnosis not present

## 2016-03-07 DIAGNOSIS — K219 Gastro-esophageal reflux disease without esophagitis: Secondary | ICD-10-CM | POA: Diagnosis not present

## 2016-03-07 DIAGNOSIS — R05 Cough: Secondary | ICD-10-CM | POA: Diagnosis not present

## 2016-03-07 DIAGNOSIS — R490 Dysphonia: Secondary | ICD-10-CM | POA: Diagnosis not present

## 2016-03-18 ENCOUNTER — Other Ambulatory Visit (HOSPITAL_COMMUNITY): Payer: Self-pay | Admitting: Emergency Medicine

## 2016-03-18 DIAGNOSIS — C50411 Malignant neoplasm of upper-outer quadrant of right female breast: Secondary | ICD-10-CM

## 2016-03-18 MED ORDER — ANASTROZOLE 1 MG PO TABS: 1.0000 mg | ORAL_TABLET | Freq: Every day | ORAL | Status: DC

## 2016-04-02 DIAGNOSIS — G4733 Obstructive sleep apnea (adult) (pediatric): Secondary | ICD-10-CM | POA: Diagnosis not present

## 2016-04-17 NOTE — Assessment & Plan Note (Signed)
Osteopenia at baseline, on AI therapy.  Now on Ca++, Vit D, and Prolia (every 6 months).  Prolia due in July 2017.

## 2016-04-17 NOTE — Progress Notes (Signed)
Crystal [name], [place] [address]  Malignant neoplasm of upper-outer quadrant of right female breast [facility])  Osteopenia determined by x-ray  CURRENT THERAPY: Arimidex beginning in June 2016  INTERVAL HISTORY: Crystal [name] 75 y.o. 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 Arnoldo Morale on 03/18/2015, followed by XRT 04/30/2015- 06/05/2015 by Dr. Pablo Ledger, [place] on AI therapy.    Breast cancer ([place])   02/19/2015 Procedure Breast, right, needle core biopsy, UOQ   02/19/2015 Pathology Results - INVASIVE DUCTAL CARCINOMA.  ER 100%, PR 100%, Ki-67 18%, HER2 NEGATIVE   03/18/2015 Procedure Breast, lumpectomy, right breast mass by Dr. Arnoldo Morale    03/18/2015 Pathology Results - INVASIVE DUCTAL CARCINOMA, NOTTINGHAM COMBINED HISTOLOGIC GRADE II, 0.9 CM. - LOW GRADE DUCTAL CARCINOMA IN SITU. - RESECTION MARGINS, NEGATIVE FOR ATYPIA OR MALIGNANCY.   03/31/2015 Initial Diagnosis Breast cancer   04/02/2015 Oncotype testing Recurrence score of 4, low-risk.   04/30/2015 - 06/05/2015 Radiation Therapy Right breast 50 Gy at 2 Gy/fraction x 25 fractions.  Dr. Pablo Ledger   06/08/2015 Imaging DEXA-  BMD as determined from [place]ck Right is 0.843 g/cm2 with a T-Score of -1.4. This patient is considered osteopenic according to [facility]) criteria.   06/10/2015 -  Anti-estrogen oral therapy Arimidex    I personally reviewed and went over laboratory results with the patient.  The results are noted within this dictation.  I personally reviewed and went over radiographic studies with the patient.  The results are noted within this dictation.  Mammogram in Feb 2017 was BIRADS 2.  She is Tolerating Arimidex without any complaints.  She continues to see her primary care physician every 4 months.  She denies any complaints today.  Past Medical History  Diagnosis Date  .  Colitis, ischemic ([place]) 08/2007, 05/2011    Last colonoscopy/bx by Dr Rourk->(08/31/07) descending colon  . Diverticulosis   . Tubular adenoma of colon 08/2007  . HTN (hypertension)   . Depression   . GERD (gastroesophageal reflux disease)   . Chronic fatigue   . OA (osteoarthritis)   . Spinal stenosis   . Sleep apnea     cpap  . Obesity   . PONV (postoperative nausea and vomiting)   . Enlarged thyroid   . Breast cancer ([place]) 03/21/15    right    has Colitis, ischemic ([place]); Tubular adenoma; Breast cancer ([place]); and Osteopenia determined by x-ray on her problem list.     has No Known Allergies.  Current Outpatient Prescriptions on File Prior to Visit  Medication Sig Dispense Refill  . acetaminophen (TYLENOL ARTHRITIS PAIN) 650 MG CR tablet Take 1,300 mg by mouth 2 (two) times daily.     Marland Kitchen ADVAIR DISKUS 100-50 MCG/DOSE AEPB INHALE 1 PUFF INTO THE LUNGS TWICE DAILY 60 each 5  . amLODipine (NORVASC) 10 MG tablet Take 10 mg by mouth daily.     Marland Kitchen anastrozole (ARIMIDEX) 1 MG tablet Take 1 tablet (1 mg total) by mouth daily. 30 tablet 5  . aspirin EC 81 MG tablet Take 81 mg by mouth daily.      Marland Kitchen buPROPion (WELLBUTRIN SR) 150 MG 12 hr tablet Take 150 mg by mouth 2 (two) times daily.     . cycloSPORINE (RESTASIS) 0.05 % ophthalmic emulsion Place 1 drop into both eyes 2 (two) times daily. For dry eyes    .  FLUoxetine (PROZAC) 20 MG capsule Take 60 mg by mouth daily.     . fluticasone (FLONASE) 50 MCG/ACT nasal spray Place 2 sprays into both nostrils daily.     Marland Kitchen HYDROMET 5-1.5 MG/5ML syrup Take 5 mLs by mouth 2 (two) times daily as needed for cough.     . loratadine (CLARITIN) 10 MG tablet Take 10 mg by mouth daily.      Marland Kitchen losartan-hydrochlorothiazide (HYZAAR) 100-25 MG per tablet Take 1 tablet by mouth daily.    . pantoprazole (PROTONIX) 40 MG tablet TAKE 1 TABLET BY MOUTH TWICE DAILY 60 tablet 5  . simvastatin (ZOCOR) 10 MG tablet Take 10 mg by mouth daily.    . Vitamin D, Ergocalciferol,  (DRISDOL) 50000 UNITS CAPS capsule Take 1 capsule (50,000 Units total) by mouth once a week. 12 capsule 0   No current facility-administered medications on file prior to visit.    Past Surgical History  Procedure Laterality Date  . Tubal ligation  1970    hysterectomy  . Abdominal hysterectomy      partial  . Breast cyst excision      benign, left x2, right x1  . Cataract extraction w/phaco  12/01/2011    Procedure: CATARACT EXTRACTION PHACO AND INTRAOCULAR LENS PLACEMENT (IOC);  Surgeon: Tonny Branch;  Location: [facility];  Service: Ophthalmology;  Laterality: Right;  CDE=14.87  . Cataract extraction w/phaco  02/06/2012    Procedure: CATARACT EXTRACTION PHACO AND INTRAOCULAR LENS PLACEMENT (IOC);  Surgeon: Tonny Branch, MD;  Location: [facility];  Service: Ophthalmology;  Laterality: Left;  CDE 13.00  . Colonoscopy  Sept 2008    Last colonoscopy/bx by Dr Rourk->(08/31/07) descending colon TUBULAR ADENOMA  . Colonoscopy  10/25/2012    Procedure: COLONOSCOPY;  Surgeon: Daneil Dolin, MD;  Location: [facility] ENDO SUITE;  Service: Endoscopy;  Laterality: N/A;  10:15-changed to 10:30 Darius Bump notified  . Partial mastectomy with needle localization and axillary sentinel lymph node bx Right 03/18/2015    Procedure: PARTIAL MASTECTOMY AFTER NEEDLE LOCALIZATION AND AXILLARY SENTINEL LYMPH NODE BX;  Surgeon: Aviva Signs Md, MD;  Location: [facility]S;  Service: General;  Laterality: Right;    Denies any headaches, dizziness, double vision, fevers, chills, night sweats, nausea, vomiting, diarrhea, constipation, chest pain, heart palpitations, shortness of breath, blood in stool, black tarry stool, urinary pain, urinary burning, urinary frequency, hematuria.   PHYSICAL EXAMINATION  ECOG PERFORMANCE STATUS: 1 - Symptomatic but completely ambulatory  Filed Vitals:   04/18/16 0919  BP: 124/54  Pulse: 73  Temp: 98.4 F (36.9 C)  Resp: 18    GENERAL:alert, no distress, well nourished, well developed, comfortable,  cooperative, smiling and unaccompanied SKIN: skin color, texture, turgor are normal, no rashes or significant lesions HEAD: Normocephalic, No masses, lesions, tenderness or abnormalities EYES: normal, PERRLA, Conjunctiva are pink and non-injected EARS: External ears normal OROPHARYNX:lips, buccal mucosa, and tongue normal and mucous membranes are moist  NECK: supple, trachea midline LYMPH:  no palpable lymphadenopathy BREAST:not examined LUNGS: clear to auscultation and percussion HEART: regular rate & rhythm, no murmurs, no gallops, S1 normal and S2 normal ABDOMEN:abdomen soft, non-tender, obese, normal bowel sounds and no masses or organomegaly BACK: Back symmetric, no curvature. EXTREMITIES:less then 2 second capillary refill, no joint deformities, effusion, or inflammation, no skin discoloration, no cyanosis  NEURO: alert & oriented x 3 with fluent speech, no focal motor/sensory deficits, gait normal    LABORATORY DATA: CBC    Component Value Date/Time   WBC 4.5  01/20/2016 1009   RBC 4.10 01/20/2016 1009   HGB 13.1 01/20/2016 1009   HCT 39.2 01/20/2016 1009   PLT 224 01/20/2016 1009   MCV 95.6 01/20/2016 1009   MCH 32.0 01/20/2016 1009   MCHC 33.4 01/20/2016 1009   RDW 13.3 01/20/2016 1009   LYMPHSABS 1.1 01/20/2016 1009   MONOABS 0.5 01/20/2016 1009   EOSABS 0.3 01/20/2016 1009   BASOSABS 0.0 01/20/2016 1009      Chemistry      Component Value Date/Time   NA 141 01/20/2016 1009   K 4.2 01/20/2016 1009   CL 108 01/20/2016 1009   CO2 24 01/20/2016 1009   BUN 16 01/20/2016 1009   CREATININE 0.80 01/20/2016 1009      Component Value Date/Time   CALCIUM 9.2 01/20/2016 1009   ALKPHOS 65 01/20/2016 1009   AST 28 01/20/2016 1009   ALT 21 01/20/2016 1009   BILITOT 0.7 01/20/2016 1009        PENDING LABS:   RADIOGRAPHIC STUDIES:  No results found.   PATHOLOGY:    ASSESSMENT AND PLAN:  Breast cancer ([place]) Stage IA (T1BN0M0) invasive RIGHT breast  cancer, ER/PR POSITIVE, HER2 NEGATIVE, S/P right breast lumpectomy by Dr. Arnoldo Morale on 03/18/2015, followed by XRT 04/30/2015- 06/05/2015 by Dr. Pablo Ledger, [place] on AI therapy.  Oncology history updated.  Labs in 3 months: CBC diff, CMET  Continue Arimidex daily as planned.  Return in 3 months for follow-up, labs, and Prolia injection.    Osteopenia determined by x-ray Osteopenia at baseline, on AI therapy.  Now on Ca++, Vit D, and Prolia (every 6 months).  Prolia due in July 2017.    THERAPY PLAN:  NCCN guidelines recommends the following surveillance for invasive breast cancer (2.2016):  A. History and Physical exam 1-4 times per year as clinically appropriate for 5 years, then annually.  B. Periodic screening for changes in family history and referral to genetics counseling as indicated  C. Educate, monitor, and refer to lymphedema management.  D. Mammography every 12 months  E. Routine imaging of reconstructed breast is not indicated.  F. In the absence of clinical signs and symptoms suggestive of recurrent disease, there is no indication for laboratory or imaging studies for metastases screening.  G. Women on Tamoxifen: annual gynecologic assessment every 12 months if uterus is present.  H. Women on aromatase inhibitor or who experience ovarian failure secondary to treatment should have monitoring of bone health with a bone mineral density determination at baseline and periodically thereafter.  I. Assess and encourage adherence to adjuvant endocrine therapy.  J. Evidence suggests that active lifestyle, healthy diet, limited alcohol intake, and achieving and maintaining an ideal body weight (20-25 BMI) may lead to optimal breast cancer outcomes.   All questions were answered. The patient knows to call the clinic with any problems, questions or concerns. We can certainly see the patient much sooner if necessary.  Patient and plan discussed with Dr. Ancil Linsey and she is in  agreement with the aforementioned.   This note is electronically signed by: Doy Mince 04/18/2016 9:50 AM

## 2016-04-17 NOTE — Assessment & Plan Note (Signed)
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 Arnoldo Morale on 03/18/2015, followed by XRT 04/30/2015- 06/05/2015 by Dr. Pablo Ledger, [place] on AI therapy.  Oncology history updated.  Labs in 3 months: CBC diff, CMET  Continue Arimidex daily as planned.  Return in 3 months for follow-up, labs, and Prolia injection.

## 2016-04-18 ENCOUNTER — Encounter (HOSPITAL_COMMUNITY): Payer: Commercial Managed Care - HMO | Attending: Oncology | Admitting: Oncology

## 2016-04-18 ENCOUNTER — Other Ambulatory Visit (HOSPITAL_COMMUNITY): Payer: Commercial Managed Care - HMO

## 2016-04-18 ENCOUNTER — Ambulatory Visit (HOSPITAL_COMMUNITY): Payer: Commercial Managed Care - HMO | Admitting: Oncology

## 2016-04-18 ENCOUNTER — Encounter (HOSPITAL_COMMUNITY): Payer: Self-pay | Admitting: Oncology

## 2016-04-18 VITALS — BP 124/54 | HR 73 | Temp 98.4°F | Resp 18 | Wt 242.8 lb

## 2016-04-18 DIAGNOSIS — C50411 Malignant neoplasm of upper-outer quadrant of right female breast: Secondary | ICD-10-CM | POA: Diagnosis not present

## 2016-04-18 DIAGNOSIS — M8580 Other specified disorders of bone density and structure, unspecified site: Secondary | ICD-10-CM

## 2016-04-18 NOTE — Patient Instructions (Signed)
[place] at [facility] Discharge Instructions  RECOMMENDATIONS MADE BY THE CONSULTANT AND ANY TEST RESULTS WILL BE SENT TO YOUR REFERRING PHYSICIAN.  Labs in 3 months Return in 3 months Prolia in 3 months  Thank you for choosing [place] at [facility] to provide your oncology and hematology care.  To afford each patient quality time with our provider, please arrive at least 15 minutes before your scheduled appointment time.   Beginning January 23rd 2017 lab work for the [facility] will be done in the  Main lab at [facility] on 1st floor. If you have a lab appointment with the [facility] please come in thru the  Main Entrance and check in at the main information desk  You need to re-schedule your appointment should you arrive 10 or more minutes late.  We strive to give you quality time with our providers, and arriving late affects you and other patients whose appointments are after yours.  Also, if you no show three or more times for appointments you may be dismissed from the clinic at the providers discretion.     Again, thank you for choosing [facility].  Our hope is that these requests will decrease the amount of time that you wait before being seen by our physicians.       _____________________________________________________________  Should you have questions after your visit to [facility], please contact our office at [phone] between the hours of 8:30 a.m. and 4:30 p.m.  Voicemails left after 4:30 p.m. will not be returned until the following business day.  For prescription refill requests, have your pharmacy contact our office.         Resources For Cancer Patients and their Caregivers ? American Cancer Society: Can assist with transportation, wigs, general needs, runs Look Good Feel Better.        (239)721-0676 ? Cancer Care: Provides financial assistance, online support groups,  medication/co-pay assistance.  1-800-813-HOPE 231 888 2977) ? [place] Assists [place] cancer patients and their families through emotional , educational and financial support.  256-176-8273 ? [place] DSS Where to apply for food stamps, Medicaid and utility assistance. (386)332-8886 ? [facility]: Transportation to medical appointments. 8147517637 ? Social Security Administration: May apply for disability if have a Stage IV cancer. (313)122-1457 (219) 408-5852 ? [employer], Disability and Transit Services: Assists with nutrition, care and transit needs. (646)368-5038

## 2016-04-22 ENCOUNTER — Other Ambulatory Visit (HOSPITAL_COMMUNITY): Payer: Self-pay | Admitting: Oncology

## 2016-04-22 DIAGNOSIS — C50411 Malignant neoplasm of upper-outer quadrant of right female breast: Secondary | ICD-10-CM

## 2016-04-22 MED ORDER — ANASTROZOLE 1 MG PO TABS: 1.0000 mg | ORAL_TABLET | Freq: Every day | ORAL | Status: DC

## 2016-04-25 ENCOUNTER — Ambulatory Visit (INDEPENDENT_AMBULATORY_CARE_PROVIDER_SITE_OTHER): Payer: Commercial Managed Care - HMO | Admitting: Pulmonary Disease

## 2016-04-25 ENCOUNTER — Encounter: Payer: Self-pay | Admitting: Pulmonary Disease

## 2016-04-25 VITALS — BP 126/82 | HR 68 | Ht 64.5 in | Wt 240.0 lb

## 2016-04-25 DIAGNOSIS — R05 Cough: Secondary | ICD-10-CM | POA: Diagnosis not present

## 2016-04-25 DIAGNOSIS — G4733 Obstructive sleep apnea (adult) (pediatric): Secondary | ICD-10-CM | POA: Diagnosis not present

## 2016-04-25 DIAGNOSIS — R053 Chronic cough: Secondary | ICD-10-CM

## 2016-04-25 LAB — PULMONARY FUNCTION TEST
DL/VA % pred: 121 %
DL/VA: 5.9 ml/min/mmHg/L
DLCO COR % PRED: 78 %
DLCO COR: 19.61 ml/min/mmHg
DLCO UNC: 20.3 ml/min/mmHg
DLCO unc % pred: 81 %
FEF 25-75 POST: 2.73 L/s
FEF 25-75 Pre: 3.2 L/sec
FEF2575-%Change-Post: -14 %
FEF2575-%PRED-POST: 166 %
FEF2575-%PRED-PRE: 195 %
FEV1-%CHANGE-POST: -3 %
FEV1-%Pred-Post: 114 %
FEV1-%Pred-Pre: 118 %
FEV1-POST: 2.43 L
FEV1-Pre: 2.52 L
FEV1FVC-%CHANGE-POST: 0 %
FEV1FVC-%PRED-PRE: 112 %
FEV6-%Change-Post: -2 %
FEV6-%Pred-Post: 104 %
FEV6-%Pred-Pre: 106 %
FEV6-Post: 2.82 L
FEV6-Pre: 2.88 L
FEV6FVC-%Change-Post: 0 %
FEV6FVC-%Pred-Post: 103 %
FEV6FVC-%Pred-Pre: 103 %
FVC-%CHANGE-POST: -4 %
FVC-%PRED-POST: 100 %
FVC-%PRED-PRE: 105 %
FVC-POST: 2.87 L
FVC-PRE: 3 L
POST FEV1/FVC RATIO: 85 %
PRE FEV6/FVC RATIO: 99 %
Post FEV6/FVC ratio: 98 %
Pre FEV1/FVC ratio: 84 %
RV % pred: 106 %
RV: 2.49 L
TLC % pred: 119 %
TLC: 6.12 L

## 2016-04-25 NOTE — Progress Notes (Signed)
PFT done today. 

## 2016-04-25 NOTE — Patient Instructions (Signed)
Continue current treatment. We will contact your CPAP supplier for a bigger mask.  Return to clinic in 6 months.

## 2016-04-25 NOTE — Progress Notes (Signed)
Subjective:    Patient ID: Crystal [name], female    DOB: [birth year], 75 y.o.   MRN: [id number] PROBLEM LIST Chronic cough Post nasal drip OSA on CPAP 5-20  HPI Crystal [name] is a 75 year old with past medical history as below. Complaints of chronic cough for several years. This is nonproductive in nature not associated with shortness of breath, wheezing, chest pain, sputum production, hemoptysis. She has significant issues with her sinusitis and postnasal drip. She states that she can feel the drip at the back of her throat with irritation and constant throat clearing. She also has history of GERD and she is on Protonix. The symptoms were exacerbated on a trip to the beach 2 months ago. She saw her primary care physician who gave her a Z pack. She also got a chest x-ray. I do not have the report with me but is reportedly normal as per the patient. She is also on a Flonase nasal spray which was started a month ago. This has apparently not helped with her symptoms. She is on Cozaar for her blood pressure medication. But she is unable to state if her symptoms of cough began when Cozaar was started.  She has history of OSA on CPAP. No history of allergies or exposures. She is a never smoker and does not drink alcohol.   Interim history: She was started on Advair and her PPI and Flonase increased.he reports that her cough has improved but not gone away completely. She still denies any dyspnea, cough, sputum production, fevers, chills.  DATA: CXR (09/17/15) No radiographic evidence of acute cardiopulmonary disease.  Sleep study 12/19/15 Moderate sleep apnea  PFTs 04/25/16 FVC 3 [105%) FEV1 2.5 to [180%) F/F 84 TLC 190% DLCO 81%. No obstruction, restriction. Minimal diffusion defect.  Past Medical History  Diagnosis Date  . Colitis, ischemic ([place]) 08/2007, 05/2011    Last colonoscopy/bx by Dr Rourk->(08/31/07) descending colon  . Diverticulosis   . Tubular adenoma of colon 08/2007  . HTN  (hypertension)   . Depression   . GERD (gastroesophageal reflux disease)   . Chronic fatigue   . OA (osteoarthritis)   . Spinal stenosis   . Sleep apnea     cpap  . Obesity   . PONV (postoperative nausea and vomiting)   . Enlarged thyroid   . Breast cancer ([place]) 03/21/15    right     Current outpatient prescriptions:  .  acetaminophen (TYLENOL ARTHRITIS PAIN) 650 MG CR tablet, Take 1,300 mg by mouth 2 (two) times daily. , Disp: , Rfl:  .  ADVAIR DISKUS 100-50 MCG/DOSE AEPB, INHALE 1 PUFF INTO THE LUNGS TWICE DAILY, Disp: 60 each, Rfl: 5 .  amLODipine (NORVASC) 10 MG tablet, Take 10 mg by mouth daily. , Disp: , Rfl:  .  anastrozole (ARIMIDEX) 1 MG tablet, Take 1 tablet (1 mg total) by mouth daily., Disp: 90 tablet, Rfl: 1 .  aspirin EC 81 MG tablet, Take 81 mg by mouth daily.  , Disp: , Rfl:  .  buPROPion (WELLBUTRIN SR) 150 MG 12 hr tablet, Take 150 mg by mouth 2 (two) times daily. , Disp: , Rfl:  .  Calcium Citrate-Vitamin D (CALCIUM + D PO), Take by mouth., Disp: , Rfl:  .  cycloSPORINE (RESTASIS) 0.05 % ophthalmic emulsion, Place 1 drop into both eyes 2 (two) times daily. For dry eyes, Disp: , Rfl:  .  FLUoxetine (PROZAC) 20 MG capsule, Take 60 mg by mouth daily. , Disp: , Rfl:  .  fluticasone (FLONASE) 50 MCG/ACT nasal spray, Place 2 sprays into both nostrils daily. , Disp: , Rfl:  .  loratadine (CLARITIN) 10 MG tablet, Take 10 mg by mouth daily.  , Disp: , Rfl:  .  losartan-hydrochlorothiazide (HYZAAR) 100-25 MG per tablet, Take 1 tablet by mouth daily., Disp: , Rfl:  .  pantoprazole (PROTONIX) 40 MG tablet, TAKE 1 TABLET BY MOUTH TWICE DAILY, Disp: 60 tablet, Rfl: 5 .  simvastatin (ZOCOR) 10 MG tablet, Take 10 mg by mouth daily., Disp: , Rfl:  .  Vitamin D, Ergocalciferol, (DRISDOL) 50000 UNITS CAPS capsule, Take 1 capsule (50,000 Units total) by mouth once a week., Disp: 12 capsule, Rfl: 0 .  HYDROMET 5-1.5 MG/5ML syrup, Take 5 mLs by mouth 2 (two) times daily as needed for  cough. Reported on 04/25/2016, Disp: , Rfl:   Review of Systems Review of Systems  Constitutional: Negative for fever, chills and unexpected weight change.  HENT: Positive for postnasal drip. Negative for congestion, dental problem, ear pain, nosebleeds, rhinorrhea, sinus pressure, sneezing, sore throat, trouble swallowing and voice change.  Eyes: Negative for visual disturbance.  Respiratory: Positive for cough. Negative for choking and shortness of breath.  Cardiovascular: Negative for chest pain and leg swelling.  Gastrointestinal: Negative for vomiting, abdominal pain and diarrhea.  Genitourinary: Negative for difficulty urinating.  Musculoskeletal: Negative for arthralgias.  Skin: Negative for rash.  Neurological: Negative for tremors, syncope and headaches.  Hematological: Does not bruise/bleed easily.     Objective:   Physical Exam  Blood pressure 126/82, pulse 68, height 5' 4.5" (1.638 m), weight 240 lb (108.863 kg), SpO2 96 %. Constitutional: She is oriented to person, place, and time. She appears well-developed and well-nourished.  HENT:  Head: Normocephalic.  Eyes: Conjunctivae are normal. Pupils are equal, round, and reactive to light.  Neck: Normal range of motion. Neck supple.  Cardiovascular: Regular rhythm.  Pulmonary/Chest: Effort normal and breath sounds normal.  Abdominal: Soft. Bowel sounds are normal.  Neurological: She is alert and oriented to person, place, and time.  Skin: Skin is warm and dry.     Assessment & Plan:  #1 Chronic cough. 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 She does have significant sinus issues and postnasal drip that could be causing her cough. She also has GERD that could be contributing as well.  She'll continue her current treatment with Advair, higher dose of Flonase and Protonix. She has already seen an improvement in her symptoms.   #2 OSA. She was reevaluated with a  sleep study last year and started on CPAP. She is tolerating this well with good compliance although the mask seems a little small. We will ask for replacement.  Plan: - Continue Flonase, Protonix, Advair. - Continue CPAP. Replace mask.  Return in 6 month.  Marshell Garfinkel MD [facility] Pager [phone] If no answer or after 3pm call: [phone] 04/25/2016, 2:06 PM

## 2016-04-28 ENCOUNTER — Emergency Department (HOSPITAL_COMMUNITY): Payer: Commercial Managed Care - HMO

## 2016-04-28 ENCOUNTER — Emergency Department (HOSPITAL_COMMUNITY)
Admission: EM | Admit: 2016-04-28 | Discharge: 2016-04-28 | Disposition: A | Discharge status: Home / Self Care | Payer: Commercial Managed Care - HMO | Attending: Emergency Medicine | Admitting: Emergency Medicine

## 2016-04-28 ENCOUNTER — Encounter (HOSPITAL_COMMUNITY): Payer: Self-pay

## 2016-04-28 DIAGNOSIS — Z7982 Long term (current) use of aspirin: Secondary | ICD-10-CM | POA: Insufficient documentation

## 2016-04-28 DIAGNOSIS — F329 Major depressive disorder, single episode, unspecified: Secondary | ICD-10-CM | POA: Insufficient documentation

## 2016-04-28 DIAGNOSIS — Z79899 Other long term (current) drug therapy: Secondary | ICD-10-CM | POA: Diagnosis not present

## 2016-04-28 DIAGNOSIS — E669 Obesity, unspecified: Secondary | ICD-10-CM | POA: Diagnosis not present

## 2016-04-28 DIAGNOSIS — M1990 Unspecified osteoarthritis, unspecified site: Secondary | ICD-10-CM | POA: Diagnosis not present

## 2016-04-28 DIAGNOSIS — M25512 Pain in left shoulder: Secondary | ICD-10-CM | POA: Insufficient documentation

## 2016-04-28 DIAGNOSIS — M898X1 Other specified disorders of bone, shoulder: Secondary | ICD-10-CM

## 2016-04-28 DIAGNOSIS — I10 Essential (primary) hypertension: Secondary | ICD-10-CM | POA: Diagnosis not present

## 2016-04-28 DIAGNOSIS — R079 Chest pain, unspecified: Secondary | ICD-10-CM | POA: Diagnosis not present

## 2016-04-28 LAB — CBC WITH DIFFERENTIAL/PLATELET
Basophils Absolute: 0 10*3/uL (ref 0.0–0.1)
Basophils Relative: 0 %
EOS ABS: 0.2 10*3/uL (ref 0.0–0.7)
Eosinophils Relative: 4 %
HEMATOCRIT: 38.2 % (ref 36.0–46.0)
HEMOGLOBIN: 12.9 g/dL (ref 12.0–15.0)
LYMPHS ABS: 1.1 10*3/uL (ref 0.7–4.0)
LYMPHS PCT: 23 %
MCH: 32.1 pg (ref 26.0–34.0)
MCHC: 33.8 g/dL (ref 30.0–36.0)
MCV: 95 fL (ref 78.0–100.0)
MONOS PCT: 12 %
Monocytes Absolute: 0.6 10*3/uL (ref 0.1–1.0)
NEUTROS ABS: 2.9 10*3/uL (ref 1.7–7.7)
Neutrophils Relative %: 61 %
Platelets: 198 10*3/uL (ref 150–400)
RBC: 4.02 MIL/uL (ref 3.87–5.11)
RDW: 13.4 % (ref 11.5–15.5)
WBC: 4.8 10*3/uL (ref 4.0–10.5)

## 2016-04-28 LAB — BASIC METABOLIC PANEL
ANION GAP: 8 (ref 5–15)
BUN: 14 mg/dL (ref 6–20)
CALCIUM: 9.1 mg/dL (ref 8.9–10.3)
CO2: 26 mmol/L (ref 22–32)
Chloride: 106 mmol/L (ref 101–111)
Creatinine, Ser: 0.66 mg/dL (ref 0.44–1.00)
GFR calc Af Amer: >60 mL/min (ref >60)
GLUCOSE: 91 mg/dL (ref 65–99)
Potassium: 4.1 mmol/L (ref 3.5–5.1)
Sodium: 140 mmol/L (ref 135–145)

## 2016-04-28 LAB — I-STAT TROPONIN, ED: TROPONIN I, POC: 0 ng/mL (ref 0.00–0.08)

## 2016-04-28 MED ORDER — IBUPROFEN 400 MG PO TABS
400.0000 mg | ORAL_TABLET | Freq: Once | ORAL | Status: AC
  Administered 2016-04-28: 400 mg via ORAL
  Filled 2016-04-28: qty 1

## 2016-04-28 NOTE — ED Notes (Signed)
Patient ambulatory to restroom without deficit  

## 2016-04-28 NOTE — Discharge Instructions (Signed)

## 2016-04-28 NOTE — ED Provider Notes (Signed)
CSN: [id number]     Arrival date & time 04/28/16  1001 History   First MD Initiated Contact with Patient 04/28/16 1011     Chief Complaint  Patient presents with  . Shoulder Pain     (Consider location/radiation/quality/duration/timing/severity/associated sxs/prior Treatment) HPI   Crystal [name] is a 75 y.o. female who presents to the Emergency Department complaining of gradual onset of left shoulder pain for 3-4 days.  She states that she recently began exercising with weights and noticed inceasing pain to the back of her left shoulder.  She describes the pain as constant and worse at rest.  She has taken tylenol without relief.  She states the pain is not reproduced with movement.  She denies chest pain, jaw pain, shortness of breath, extremity numbness or weakness.     Past Medical History  Diagnosis Date  . Colitis, ischemic ([place]) 08/2007, 05/2011    Last colonoscopy/bx by Dr Rourk->(08/31/07) descending colon  . Diverticulosis   . Tubular adenoma of colon 08/2007  . HTN (hypertension)   . Depression   . GERD (gastroesophageal reflux disease)   . Chronic fatigue   . OA (osteoarthritis)   . Spinal stenosis   . Sleep apnea     cpap  . Obesity   . PONV (postoperative nausea and vomiting)   . Enlarged thyroid   . Breast cancer ([place]) 03/21/15    right   Past Surgical History  Procedure Laterality Date  . Tubal ligation  1970    hysterectomy  . Abdominal hysterectomy      partial  . Breast cyst excision      benign, left x2, right x1  . Cataract extraction w/phaco  12/01/2011    Procedure: CATARACT EXTRACTION PHACO AND INTRAOCULAR LENS PLACEMENT (IOC);  Surgeon: Tonny Branch;  Location: [facility];  Service: Ophthalmology;  Laterality: Right;  CDE=14.87  . Cataract extraction w/phaco  02/06/2012    Procedure: CATARACT EXTRACTION PHACO AND INTRAOCULAR LENS PLACEMENT (IOC);  Surgeon: Tonny Branch, MD;  Location: [facility];  Service: Ophthalmology;  Laterality: Left;  CDE 13.00  . Colonoscopy   Sept 2008    Last colonoscopy/bx by Dr Rourk->(08/31/07) descending colon TUBULAR ADENOMA  . Colonoscopy  10/25/2012    Procedure: COLONOSCOPY;  Surgeon: Daneil Dolin, MD;  Location: [facility];  Service: Endoscopy;  Laterality: N/A;  10:15-changed to 10:30 Darius Bump notified  . Partial mastectomy with needle localization and axillary sentinel lymph node bx Right 03/18/2015    Procedure: PARTIAL MASTECTOMY AFTER NEEDLE LOCALIZATION AND AXILLARY SENTINEL LYMPH NODE BX;  Surgeon: Aviva Signs Md, MD;  Location: [facility];  Service: General;  Laterality: Right;   Family History  Problem Relation Age of Onset  . Cancer Brother 45  . Coronary artery disease Brother   . Diabetes Sister   . Anesthesia problems Neg Hx   . Hypotension Neg Hx   . Pseudochol deficiency Neg Hx   . Malignant hyperthermia Neg Hx   . Colon cancer Neg Hx   . Heart disease Father   . Heart disease Brother    Social History  Substance Use Topics  . Smoking status: Never Smoker   . Smokeless tobacco: Never Used  . Alcohol Use: No   OB History    No data available     Review of Systems  Constitutional: Negative for fever and chills.  Respiratory: Negative for cough, chest tightness and shortness of breath.   Cardiovascular: Negative for chest pain.  Gastrointestinal:  Positive for nausea. Negative for vomiting and abdominal pain.  Musculoskeletal: Positive for arthralgias (left posterior shoulder pain). Negative for joint swelling and neck pain.  Skin: Negative for color change, rash and wound.  Neurological: Negative for dizziness, weakness, numbness and headaches.  All other systems reviewed and are negative.     Allergies  Review of patient's allergies indicates no known allergies.  Home Medications   Prior to Admission medications   Medication Sig Start Date End Date Taking? Authorizing Provider  acetaminophen (TYLENOL ARTHRITIS PAIN) 650 MG CR tablet Take 1,300 mg by mouth 2 (two) times daily.      Historical Provider, MD  ADVAIR DISKUS 100-50 MCG/DOSE AEPB INHALE 1 PUFF INTO THE LUNGS TWICE DAILY 12/14/15   Praveen Mannam, MD  amLODipine (NORVASC) 10 MG tablet Take 10 mg by mouth daily.  10/02/12   Historical Provider, MD  anastrozole (ARIMIDEX) 1 MG tablet Take 1 tablet (1 mg total) by mouth daily. 04/22/16   Baird Cancer, PA-C  aspirin EC 81 MG tablet Take 81 mg by mouth daily.      Historical Provider, MD  buPROPion (WELLBUTRIN SR) 150 MG 12 hr tablet Take 150 mg by mouth 2 (two) times daily.  06/02/11   Historical Provider, MD  Calcium Citrate-Vitamin D (CALCIUM + D PO) Take by mouth.    Historical Provider, MD  cycloSPORINE (RESTASIS) 0.05 % ophthalmic emulsion Place 1 drop into both eyes 2 (two) times daily. For dry eyes    Historical Provider, MD  FLUoxetine (PROZAC) 20 MG capsule Take 60 mg by mouth daily.  06/23/11   Historical Provider, MD  fluticasone (FLONASE) 50 MCG/ACT nasal spray Place 2 sprays into both nostrils daily.     Historical Provider, MD  HYDROMET 5-1.5 MG/5ML syrup Take 5 mLs by mouth 2 (two) times daily as needed for cough. Reported on 04/25/2016 01/10/14   Historical Provider, MD  loratadine (CLARITIN) 10 MG tablet Take 10 mg by mouth daily.      Historical Provider, MD  losartan-hydrochlorothiazide (HYZAAR) 100-25 MG per tablet Take 1 tablet by mouth daily.    Historical Provider, MD  pantoprazole (PROTONIX) 40 MG tablet TAKE 1 TABLET BY MOUTH TWICE DAILY 12/14/15   Praveen Mannam, MD  simvastatin (ZOCOR) 10 MG tablet Take 10 mg by mouth daily.    Historical Provider, MD  Vitamin D, Ergocalciferol, (DRISDOL) 50000 UNITS CAPS capsule Take 1 capsule (50,000 Units total) by mouth once a week. 12/08/15   Manon Hilding Kefalas, PA-C   BP 154/77 mmHg  Pulse 85  Temp(Src) 97.9 F (36.6 C) (Oral)  Resp 20  Ht 5\' 4"  (1.626 m)  Wt 108.863 kg  BMI 41.18 kg/m2  SpO2 99% Physical Exam  Constitutional: She is oriented to person, place, and time. She appears well-developed and  well-nourished. No distress.  HENT:  Head: Normocephalic and atraumatic.  Neck: Normal range of motion. Neck supple. No JVD present. No thyromegaly present.  Cardiovascular: Normal rate, regular rhythm and intact distal pulses.   No murmur heard. Pulmonary/Chest: Effort normal and breath sounds normal. No respiratory distress. She exhibits no tenderness.  Musculoskeletal: She exhibits tenderness. She exhibits no edema.  Pt able to range left shoulder without difficulty.  Mild tenderness of the left trapezius muscle. Radial pulse is brisk, distal sensation intact, CR< 2 sec. Grip strength is strong and symmetrical. Strength 5/5 against resistance bilateral UE's.  No erythema, edema or step-off deformity of the joint.   Neurological: She is alert and  oriented to person, place, and time. She has normal strength. No sensory deficit. She exhibits normal muscle tone. Coordination normal.  Reflex Scores:      Tricep reflexes are 1+ on the right side and 2+ on the left side.      Bicep reflexes are 1+ on the right side and 2+ on the left side. Skin: Skin is warm and dry.  Nursing note and vitals reviewed.   ED Course  Procedures (including critical care time) Labs Review Labs Reviewed  BASIC METABOLIC PANEL  CBC WITH DIFFERENTIAL/PLATELET  Randolm Idol, ED    Imaging Review Dg Chest 2 View  04/28/2016  CLINICAL DATA:  Chest pain, primarily in the left scapular region. History of right breast carcinoma EXAM: CHEST  2 VIEW COMPARISON:  September 17, 2015 and March 16, 2015 FINDINGS: Scarring in the lingula is again noted. There is no edema or consolidation. Heart is borderline enlarged with pulmonary vascularity within normal limits, stable. No adenopathy evident. There is degenerative change in the thoracic spine with increased kyphosis. No blastic or lytic bone lesions are evident. There is a focal area of calcification in the right carotid artery. IMPRESSION: Lingular scarring. No edema or  consolidation. Stable cardiac silhouette. Focal area of calcification in the right carotid artery. Electronically Signed   By: Lowella Grip III M.D.   On: 04/28/2016 11:01    I have personally reviewed and evaluated these images and lab results as part of my medical decision-making.   EKG Interpretation   Date/Time:  Thursday Apr 28 2016 10:13:29 EDT Ventricular Rate:  81 PR Interval:  177 QRS Duration: 99 QT Interval:  389 QTC Calculation: 451 R Axis:   -22 Text Interpretation:  Sinus rhythm Consider left atrial enlargement  Borderline left axis deviation Low voltage, precordial leads Probable  anteroseptal infarct, old No significant change since last tracing  Confirmed by Christy Gentles  MD, [place] ([id number]) on 04/28/2016 10:17:17 AM      MDM   Final diagnoses:  Pain of left scapula    Pt well appearing, no distress.  NV intact.  No concerning sx's for septic joint. Remains NV intact.  Pt agrees to close ortho f/u in one week if needed.  She was also seen by Dr. Christy Gentles and patient's disposition was set by him.   Kem Parkinson, PA-C 04/30/16 [id number]  Ripley Fraise, MD 05/01/16 [phone]

## 2016-04-28 NOTE — ED Notes (Signed)
Pt reports has recently started exercising and reports pain in left shoulder.  Pt says the pain is worse when she lays down.  However, pt says movement and palpation do not seem to make her pain any worse.  Denies any chest pain or SOB but reports felt nauseated this morning.

## 2016-05-02 DIAGNOSIS — G4733 Obstructive sleep apnea (adult) (pediatric): Secondary | ICD-10-CM | POA: Diagnosis not present

## 2016-05-17 DIAGNOSIS — G4733 Obstructive sleep apnea (adult) (pediatric): Secondary | ICD-10-CM | POA: Diagnosis not present

## 2016-06-02 DIAGNOSIS — G4733 Obstructive sleep apnea (adult) (pediatric): Secondary | ICD-10-CM | POA: Diagnosis not present

## 2016-06-06 ENCOUNTER — Ambulatory Visit (INDEPENDENT_AMBULATORY_CARE_PROVIDER_SITE_OTHER): Payer: Commercial Managed Care - HMO | Admitting: Otolaryngology

## 2016-06-06 DIAGNOSIS — R05 Cough: Secondary | ICD-10-CM | POA: Diagnosis not present

## 2016-06-06 DIAGNOSIS — K219 Gastro-esophageal reflux disease without esophagitis: Secondary | ICD-10-CM | POA: Diagnosis not present

## 2016-06-06 DIAGNOSIS — I10 Essential (primary) hypertension: Secondary | ICD-10-CM | POA: Diagnosis not present

## 2016-06-06 DIAGNOSIS — R490 Dysphonia: Secondary | ICD-10-CM

## 2016-06-15 ENCOUNTER — Other Ambulatory Visit: Payer: Self-pay | Admitting: Pulmonary Disease

## 2016-07-02 DIAGNOSIS — G4733 Obstructive sleep apnea (adult) (pediatric): Secondary | ICD-10-CM | POA: Diagnosis not present

## 2016-07-20 ENCOUNTER — Encounter (HOSPITAL_COMMUNITY): Payer: Commercial Managed Care - HMO | Attending: Hematology & Oncology

## 2016-07-20 ENCOUNTER — Other Ambulatory Visit (HOSPITAL_COMMUNITY): Payer: Commercial Managed Care - HMO

## 2016-07-20 ENCOUNTER — Encounter (HOSPITAL_COMMUNITY): Payer: Self-pay | Admitting: Hematology & Oncology

## 2016-07-20 ENCOUNTER — Encounter (HOSPITAL_BASED_OUTPATIENT_CLINIC_OR_DEPARTMENT_OTHER): Payer: Commercial Managed Care - HMO | Admitting: Hematology & Oncology

## 2016-07-20 ENCOUNTER — Encounter (HOSPITAL_COMMUNITY): Payer: Commercial Managed Care - HMO

## 2016-07-20 VITALS — BP 155/62 | HR 78 | Temp 98.4°F | Resp 18 | Wt 242.1 lb

## 2016-07-20 DIAGNOSIS — M8580 Other specified disorders of bone density and structure, unspecified site: Secondary | ICD-10-CM

## 2016-07-20 DIAGNOSIS — C50911 Malignant neoplasm of unspecified site of right female breast: Secondary | ICD-10-CM

## 2016-07-20 DIAGNOSIS — C50411 Malignant neoplasm of upper-outer quadrant of right female breast: Secondary | ICD-10-CM

## 2016-07-20 DIAGNOSIS — Z79899 Other long term (current) drug therapy: Secondary | ICD-10-CM

## 2016-07-20 LAB — COMPREHENSIVE METABOLIC PANEL
ALK PHOS: 64 U/L (ref 38–126)
ALT: 21 U/L (ref 14–54)
AST: 25 U/L (ref 15–41)
Albumin: 4.2 g/dL (ref 3.5–5.0)
Anion gap: 5 (ref 5–15)
BILIRUBIN TOTAL: 0.7 mg/dL (ref 0.3–1.2)
BUN: 15 mg/dL (ref 6–20)
CALCIUM: 9.3 mg/dL (ref 8.9–10.3)
CHLORIDE: 108 mmol/L (ref 101–111)
CO2: 26 mmol/L (ref 22–32)
CREATININE: 0.69 mg/dL (ref 0.44–1.00)
Glucose, Bld: 83 mg/dL (ref 65–99)
Potassium: 4.2 mmol/L (ref 3.5–5.1)
Sodium: 139 mmol/L (ref 135–145)
TOTAL PROTEIN: 7.2 g/dL (ref 6.5–8.1)

## 2016-07-20 NOTE — Patient Instructions (Signed)
[place] at [facility] Discharge Instructions  RECOMMENDATIONS MADE BY THE CONSULTANT AND ANY TEST RESULTS WILL BE SENT TO YOUR REFERRING PHYSICIAN.  You saw Dr. Whitney Muse today. Continue Prolia. Insurance will not pay for next Prolia injection until week of 07/24/16. Return for follow up in 4 months.  Thank you for choosing [place] at [facility] to provide your oncology and hematology care.  To afford each patient quality time with our provider, please arrive at least 15 minutes before your scheduled appointment time.   Beginning January 23rd 2017 lab work for the [facility] will be done in the  Main lab at [facility] on 1st floor. If you have a lab appointment with the [facility] please come in thru the  Main Entrance and check in at the main information desk  You need to re-schedule your appointment should you arrive 10 or more minutes late.  We strive to give you quality time with our providers, and arriving late affects you and other patients whose appointments are after yours.  Also, if you no show three or more times for appointments you may be dismissed from the clinic at the providers discretion.     Again, thank you for choosing [facility].  Our hope is that these requests will decrease the amount of time that you wait before being seen by our physicians.       _____________________________________________________________  Should you have questions after your visit to [facility], please contact our office at [phone] between the hours of 8:30 a.m. and 4:30 p.m.  Voicemails left after 4:30 p.m. will not be returned until the following business day.  For prescription refill requests, have your pharmacy contact our office.         Resources For Cancer Patients and their Caregivers ? American Cancer Society: Can assist with transportation, wigs, general needs, runs Look Good Feel Better.         (540) 635-6222 ? Cancer Care: Provides financial assistance, online support groups, medication/co-pay assistance.  1-800-813-HOPE 628 695 9544) ? [place] Assists [place] cancer patients and their families through emotional , educational and financial support.  2154171598 ? [place] DSS Where to apply for food stamps, Medicaid and utility assistance. (754)634-9555 ? [facility]: Transportation to medical appointments. 606 158 7731 ? Social Security Administration: May apply for disability if have a Stage IV cancer. 804-774-4542 913-430-3955 ? [employer], Disability and Transit Services: Assists with nutrition, care and transit needs. [place] Support Programs: @10RELATIVEDAYS @ > Cancer Support Group  2nd Tuesday of the month 1pm-2pm, Journey Room  > Creative Journey  3rd Tuesday of the month 1130am-1pm, Journey Room  > Look Good Feel Better  1st Wednesday of the month 10am-12 noon, Journey Room (Call [place] to register [phone])   \

## 2016-07-20 NOTE — Progress Notes (Signed)
Patient did not get Prolia injection today because it was not authorized to be given. New appt given.

## 2016-07-20 NOTE — Progress Notes (Signed)
[facility] Progress Note  Patient Care Team: Asencion Noble, MD as PCP - General (Internal Medicine) Daneil Dolin, MD (Gastroenterology)  CHIEF COMPLAINTS/PURPOSE OF CONSULTATION:  Right Partial mastectomy with needle localization and axillary sentinel node on 03/18/2015 with Dr. 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  ER 100%, PR 100%, Ki-67 18%, HER2 NEGATIVE     03/18/2015 Procedure    Breast, lumpectomy, right breast mass by Dr. Arnoldo Morale      03/18/2015 Pathology Results    - INVASIVE DUCTAL CARCINOMA, NOTTINGHAM COMBINED HISTOLOGIC GRADE II, 0.9 CM. - LOW GRADE DUCTAL CARCINOMA IN SITU. - RESECTION MARGINS, NEGATIVE FOR ATYPIA OR MALIGNANCY.     03/31/2015 Initial Diagnosis    Breast cancer     04/02/2015 Oncotype testing    Recurrence score of 4, low-risk.     04/30/2015 - 06/05/2015 Radiation Therapy    Right breast 50 Gy at 2 Gy/fraction x 25 fractions.  Dr. Pablo Ledger     06/08/2015 Imaging    DEXA-  BMD as determined from Femur Neck Right is 0.843 g/cm2 with a T-Score of -1.4. This patient is considered osteopenic according to [facility]) criteria.     06/10/2015 -  Anti-estrogen oral therapy    Arimidex      HISTORY OF PRESENTING ILLNESS:  Crystal [name] 75 y.o. 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 She has done well from a surgical perspective. She denies fever or chills. She denies any pain. She has completed XRT. She continues on arimidex, calcium and vitamin D. She is on prolia.  Crystal [name] is unaccompanied. She is here for Prolia.  She is doing well. She has avoided going  outside due to the hot weather. She is planning a trip to [facility] in September. Her appetite is "100%". She has been eating her vegetables, fruits, and cake every now and then.   Her only complaint is intermittent hip pain. This has been an ongoing issue and does not currently hurt. She has had previous X-rays and shots. Typically this hip pain is predominant in the mornings when she first gets up and decreases after she takes a Tylenol. She is still able to do what she needs to do. She is not interested in further investigation of the hip pain at this time.  Reports she has fallen several times since our last visit. She saw her PCP Dr. Willey Blade after her second fall. She did not break any bones with these falls. Notes she is on a medicine called gabapentin and figured it was this medication causing her falls. She has since discontinued this medication. She has a LifeAlert.   She denies any problems with her breasts. Her only prior issue was when they were raw after radiation treatment. She is taking calcium and Vitamin D supplements, "My once a week pill". Last mammogram was in February.   She denies abdominal pain.    MEDICAL HISTORY:  Past Medical History:  Diagnosis Date  . Breast cancer ([facility]) 03/21/15   right  . Chronic fatigue   . Colitis, ischemic ([place]) 08/2007,  05/2011   Last colonoscopy/bx by Dr Rourk->(08/31/07) descending colon  . Depression   . Diverticulosis   . Enlarged thyroid   . GERD (gastroesophageal reflux disease)   . HTN (hypertension)   . OA (osteoarthritis)   . Obesity   . PONV (postoperative nausea and vomiting)   . Sleep apnea    cpap  . Spinal stenosis   . Tubular adenoma of colon 08/2007    SURGICAL HISTORY: Past Surgical History:  Procedure Laterality Date  . ABDOMINAL HYSTERECTOMY     partial  . BREAST CYST EXCISION     benign, left x2, right x1  . CATARACT EXTRACTION W/PHACO  12/01/2011   Procedure: CATARACT EXTRACTION PHACO AND INTRAOCULAR LENS  PLACEMENT (IOC);  Surgeon: Tonny Branch;  Location: [facility] OR[facility];  Service: Ophthalmology;  Laterality: Right;  CDE=14.87  . CATARACT EXTRACTION W/PHACO  02/06/2012   Procedure: CATARACT EXTRACTION PHACO AND INTRAOCULAR LENS PLACEMENT (IOC);  Surgeon: Tonny Branch, MD;  Location: [facility] ORS;  Service: Ophthalmology;  Laterality: Left;  CDE 13.00  . COLONOSCOPY  Sept 2008   Last colonoscopy/bx by Dr Rourk->(08/31/07) descending colon TUBULAR ADENOMA  . COLONOSCOPY  10/25/2012   Procedure: COLONOSCOPY;  Surgeon: Daneil Dolin, MD;  Location: [facility] ENDO SUITE;  Service: Endoscopy;  Laterality: N/A;  10:15-changed to 10:30 Darius Bump notified  . PARTIAL MASTECTOMY WITH NEEDLE LOCALIZATION AND AXILLARY SENTINEL LYMPH NODE BX Right 03/18/2015   Procedure: PARTIAL MASTECTOMY AFTER NEEDLE LOCALIZATION AND AXILLARY SENTINEL LYMPH NODE BX;  Surgeon: Aviva Signs Md, MD;  Location: [facility] ORS;  Service: General;  Laterality: Right;  . TUBAL LIGATION  1970   hysterectomy    SOCIAL HISTORY: Social History   Social History  . Marital status: Widowed    Spouse name: N/A  . Number of children: 2  . Years of education: N/A   Occupational History  . Lab Tech-retired    Social History Main Topics  . Smoking status: Never Smoker  . Smokeless tobacco: Never Used  . Alcohol use No  . Drug use: No  . Sexual activity: No   Other Topics Concern  . Not on file   Social History Narrative  . No narrative on file   she has 3 children. She is widowed, her husband died from prostate cancer 6 years ago. They were married for 49 years. She has 5 grandchildren. She was [place]. She is a nonsmoker, never smoker. She does not drink alcohol. She enjoys reading, puzzles, and playing solitaire  FAMILY HISTORY: Family History  Problem Relation Age of Onset  . Cancer Brother 57  . Coronary artery disease Brother   . Diabetes Sister   . Anesthesia problems Neg Hx   . Hypotension Neg Hx   . Pseudochol deficiency Neg Hx     . Malignant hyperthermia Neg Hx   . Colon cancer Neg Hx   . Heart disease Father   . Heart disease Brother    indicated that only one of her two brothers is alive.   2 brothers are deceased, one at the age of 72 from metastatic cancer of unknown primary. A second brother died from complications of heart disease. He was 77. She has one sister who is alive and has arthritis but is otherwise healthy. Her mother died at the age of 20 from age-related causes. Father died at 78 from complications of heart disease.   ALLERGIES:  has No Known Allergies.  MEDICATIONS:  Current Outpatient Prescriptions  Medication Sig  Dispense Refill  . acetaminophen (TYLENOL ARTHRITIS PAIN) 650 MG CR tablet Take 1,300 mg by mouth 2 (two) times daily.     Marland Kitchen ADVAIR DISKUS 100-50 MCG/DOSE AEPB INHALE 1 PUFF INTO THE LUNGS TWICE DAILY 60 each 5  . amLODipine (NORVASC) 10 MG tablet Take 10 mg by mouth daily.     Marland Kitchen anastrozole (ARIMIDEX) 1 MG tablet Take 1 tablet (1 mg total) by mouth daily. 90 tablet 1  . aspirin EC 81 MG tablet Take 81 mg by mouth daily.      Marland Kitchen buPROPion (WELLBUTRIN SR) 150 MG 12 hr tablet Take 150 mg by mouth 2 (two) times daily.     . Calcium Citrate-Vitamin D (CALCIUM + D PO) Take by mouth.    . cycloSPORINE (RESTASIS) 0.05 % ophthalmic emulsion Place 1 drop into both eyes 2 (two) times daily. For dry eyes    . FLUoxetine (PROZAC) 20 MG capsule Take 60 mg by mouth daily.     . fluticasone (FLONASE) 50 MCG/ACT nasal spray Place 2 sprays into both nostrils daily.     Marland Kitchen HYDROMET 5-1.5 MG/5ML syrup Take 5 mLs by mouth 2 (two) times daily as needed for cough. Reported on 04/25/2016    . loratadine (CLARITIN) 10 MG tablet Take 10 mg by mouth daily.      Marland Kitchen losartan-hydrochlorothiazide (HYZAAR) 100-25 MG per tablet Take 1 tablet by mouth daily.    . pantoprazole (PROTONIX) 40 MG tablet TAKE 1 TABLET BY MOUTH TWICE DAILY 60 tablet 5  . simvastatin (ZOCOR) 10 MG tablet Take 10 mg by mouth daily.    .  Vitamin D, Ergocalciferol, (DRISDOL) 50000 UNITS CAPS capsule Take 1 capsule (50,000 Units total) by mouth once a week. 12 capsule 0   No current facility-administered medications for this visit.     Review of Systems  Constitutional: Negative for fever, chills, weight loss and malaise/fatigue.  HENT: Negative for congestion, hearing loss, nosebleeds, sore throat and tinnitus.   Eyes: Negative for blurred vision, double vision, pain and discharge.  Respiratory: Negative for cough, hemoptysis, sputum production, shortness of breath and wheezing.   Cardiovascular: Negative for chest pain, palpitations, claudication, leg swelling and PND.  Gastrointestinal: Negative for heartburn, nausea, vomiting, abdominal pain, diarrhea, constipation, blood in stool and melena.  Genitourinary: Negative for dysuria, urgency, frequency and hematuria.  Musculoskeletal: Positive for joint pain, hip pain, and falls. Negative for myalgias.  Arthritis. Intermittent hip pain, chronic. Skin: Negative for itching and rash.  Neurological: Negative for dizziness, tingling, tremors, sensory change, speech change, focal weakness, seizures, loss of consciousness, weakness and headaches.  Endo/Heme/Allergies: Does not bruise/bleed easily.  Psychiatric/Behavioral: Negative for depression, suicidal ideas, memory loss and substance abuse. The patient is not nervous/anxious and does not have insomnia.    14 point review of systems was performed and is negative except as detailed under history of present illness and above   PHYSICAL EXAMINATION:  ECOG PERFORMANCE STATUS: 0 - Asymptomatic  Vitals:   07/20/16 1100  BP: (!) 155/62  Pulse: 78  Resp: 18  Temp: 98.4 F (36.9 C)   Filed Weights   07/20/16 1100  Weight: 242 lb 1.6 oz (109.8 kg)   Physical Exam  Constitutional: She is oriented to person, place, and time and well-developed, well-nourished, and in no distress.  Obese  HENT:  Head: Normocephalic and  atraumatic.  Nose: Nose normal.  Mouth/Throat: Oropharynx is clear and moist. No oropharyngeal exudate.  Eyes: Conjunctivae and EOM are normal. Pupils  are equal, round, and reactive to light. Right eye exhibits no discharge. Left eye exhibits no discharge. No scleral icterus.  Neck: Normal range of motion. Neck supple. No tracheal deviation present. No thyromegaly present.  Cardiovascular: Normal rate, regular rhythm and normal heart sounds.  Exam reveals no gallop and no friction rub.   No murmur heard. Pulmonary/Chest: Effort normal and breath sounds normal. She has no wheezes. She has no rales.   Abdominal: Soft. Bowel sounds are normal. She exhibits no distension and no mass. There is no tenderness. There is no rebound and no guarding.  Musculoskeletal: Normal range of motion. She exhibits no edema.  Lymphadenopathy:    She has no cervical adenopathy.  Neurological: She is alert and oriented to person, place, and time. She has normal reflexes. No cranial nerve deficit. Gait normal. Coordination normal.  Skin: Skin is warm and dry. No rash noted.  Psychiatric: Mood, memory, affect and judgment normal.  Nursing note and vitals reviewed.   LABORATORY DATA:  I have reviewed the data as listed Lab Results  Component Value Date   WBC 4.8 04/28/2016   HGB 12.9 04/28/2016   HCT 38.2 04/28/2016   MCV 95.0 04/28/2016   PLT 198 04/28/2016     Chemistry      Component Value Date/Time   NA 139 07/20/2016 1007   K 4.2 07/20/2016 1007   CL 108 07/20/2016 1007   CO2 26 07/20/2016 1007   BUN 15 07/20/2016 1007   CREATININE 0.69 07/20/2016 1007      Component Value Date/Time   CALCIUM 9.3 07/20/2016 1007   ALKPHOS 64 07/20/2016 1007   AST 25 07/20/2016 1007   ALT 21 07/20/2016 1007   BILITOT 0.7 07/20/2016 1007       RADIOGRAPHIC STUDIES: I have personally reviewed the radiological images as listed and agreed with the findings in the report.  Study Result   CLINICAL DATA:   75 year old patient for annual examination. Diagnosed with right breast cancer in 2016. The patient is status post right breast lumpectomy. She tells [place] today that she does use cornstarch outer on her breast. History of benign excisional biopsy of the left breast.  EXAM: DIGITAL DIAGNOSTIC BILATERAL MAMMOGRAM WITH 3D TOMOSYNTHESIS AND CAD  COMPARISON:  Previous exam(s).  ACR Breast Density Category b: There are scattered areas of fibroglandular density.  FINDINGS: There are lumpectomy changes in the outer right breast. Curvilinear high density material is within the interstices of a skin mole on the inferior central left breast posteriorly, similar in appearance to prior mammogram of 2016. This is consistent with residual topical powder associated with the mole. No suspicious microcalcifications, nonsurgical distortion, or mass is identified in either breast. Atherosclerotic vascular calcifications are noted.  Mammographic images were processed with CAD.  IMPRESSION: No evidence of malignancy in either breast. Lumpectomy changes on the right.  RECOMMENDATION: Diagnostic mammogram is suggested in 1 year. (Code:[id number]-B-01Y)  I have discussed the findings and recommendations with the patient. Results were also provided in writing at the conclusion of the visit. If applicable, a reminder letter will be sent to the patient regarding the next appointment.  BI-RADS CATEGORY  2: Benign.   Electronically Signed   By: Curlene Dolphin M.D.   On: 02/09/2016 08:52     ASSESSMENT & PLAN:   Stage I, ER+. 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 Recurrence score of 4 with 10 year risk of  distant recurrence 5% with Tamoxifen alone Osteopenia Arimidex, calcium, vitamin D and Prolia Joint Pain  Pleasant 75 year old female with a stage I ER positive, PR positive, and HER-2 negative carcinoma of the breast.  Largest tumor dimension was 0.9 cm. She is up to date on mammography. She is currently doing well.   She continues on arimidex 1 mg daily. She is doing well with no complaints.  I have ordered prolia, she is on calcium and vitamin D. She has excellent dental care. She is due for prolia today. Risks and benefits of prolia were again discussed including risk of osteonecrosis of the jaw.  I have ordered a repeat mammography for February 2018.  She does not need any refills at this time.   She will return for follow up in 4 months. At this next visit, I will perform an annual breast exam.  All questions were answered. The patient knows to call the clinic with any problems, questions or concerns.    This document serves as a record of services personally performed by Ancil Linsey, MD. It was created on her behalf by Arlyce Harman, a trained medical scribe. The creation of this record is based on the scribe's personal observations and the provider's statements to them. This document has been checked and approved by the attending provider.  I have reviewed the above documentation for accuracy and completeness, and I agree with the above.  This note was electronically signed.   Kelby Fam. Whitney Muse, MD

## 2016-07-29 ENCOUNTER — Encounter (HOSPITAL_COMMUNITY): Payer: Commercial Managed Care - HMO | Attending: Hematology & Oncology

## 2016-07-29 VITALS — BP 148/70 | HR 81 | Temp 98.6°F | Resp 18

## 2016-07-29 DIAGNOSIS — M8580 Other specified disorders of bone density and structure, unspecified site: Secondary | ICD-10-CM | POA: Insufficient documentation

## 2016-07-29 DIAGNOSIS — C50411 Malignant neoplasm of upper-outer quadrant of right female breast: Secondary | ICD-10-CM

## 2016-07-29 MED ORDER — DENOSUMAB 60 MG/ML ~~LOC~~ SOLN
60.0000 mg | Freq: Once | SUBCUTANEOUS | Status: AC
  Administered 2016-07-29: 60 mg via SUBCUTANEOUS
  Filled 2016-07-29: qty 1

## 2016-07-29 NOTE — Patient Instructions (Signed)
[facility] at [facility] Discharge Instructions  RECOMMENDATIONS MADE BY THE CONSULTANT AND ANY TEST RESULTS WILL BE SENT TO YOUR REFERRING PHYSICIAN.  Prolia given today per orders. Follow up as scheduled  Thank you for choosing [facility] at [facility] to provide your oncology and hematology care.  To afford each patient quality time with our provider, please arrive at least 15 minutes before your scheduled appointment time.   Beginning January 23rd 2017 lab work for the Cancer Center will be done in the  Main lab at [place] [place] [facility] on 1st floor. If you have a lab appointment with the Cancer Center please come in thru the  Main Entrance and check in at the main information desk  You need to re-schedule your appointment should you arrive 10 or more minutes late.  We strive to give you quality time with our providers, and arriving late affects you and other patients whose appointments are after yours.  Also, if you no show three or more times for appointments you may be dismissed from the clinic at the providers discretion.     Again, thank you for choosing [facility].  Our hope is that these requests will decrease the amount of time that you wait before being seen by our physicians.       _____________________________________________________________  Should you have questions after your visit to [facility], please contact our office at [phone] between the hours of 8:30 a.m. and 4:30 p.m.  Voicemails left after 4:30 p.m. will not be returned until the following business day.  For prescription refill requests, have your pharmacy contact our office.         Resources For Cancer Patients and their Caregivers ? American Cancer Society: Can assist with transportation, wigs, general needs, runs Look Good Feel Better.        1-888-227-6333 ? Cancer Care: Provides financial assistance, online support groups,  medication/co-pay assistance.  1-800-813-HOPE (4673) ? [facility] Assists [place] cancer patients and their families through emotional , educational and financial support.  336-427-4357 ? [place] DSS Where to apply for food stamps, Medicaid and utility assistance. 336-342-1394 ? [facility]: Transportation to medical appointments. 336-347-2287 ? Social Security Administration: May apply for disability if have a Stage IV cancer. 336-342-7796 1-800-772-1213 ? [place] Aging, Disability and Transit Services: Assists with nutrition, care and transit needs. 336-349-2343  Cancer Center Support Programs: @10RELATIVEDAYS@ > Cancer Support Group  2nd Tuesday of the month 1pm-2pm, Journey Room  > Creative Journey  3rd Tuesday of the month 1130am-1pm, Journey Room  > Look Good Feel Better  1st Wednesday of the month 10am-12 noon, Journey Room (Call American Cancer Society to register 1-800-395-5775)   

## 2016-07-29 NOTE — Progress Notes (Signed)
Crystal [name] presents today for injection per MD orders. Prolia 60mg  administered SQ in right Abdomen. Administration without incident. Patient tolerated well.

## 2016-08-02 DIAGNOSIS — G4733 Obstructive sleep apnea (adult) (pediatric): Secondary | ICD-10-CM | POA: Diagnosis not present

## 2016-08-12 ENCOUNTER — Encounter (HOSPITAL_COMMUNITY): Payer: Self-pay | Admitting: Hematology & Oncology

## 2016-08-31 DIAGNOSIS — G4733 Obstructive sleep apnea (adult) (pediatric): Secondary | ICD-10-CM | POA: Diagnosis not present

## 2016-09-02 DIAGNOSIS — G4733 Obstructive sleep apnea (adult) (pediatric): Secondary | ICD-10-CM | POA: Diagnosis not present

## 2016-10-02 DIAGNOSIS — G4733 Obstructive sleep apnea (adult) (pediatric): Secondary | ICD-10-CM | POA: Diagnosis not present

## 2016-10-04 ENCOUNTER — Other Ambulatory Visit (HOSPITAL_COMMUNITY): Payer: Self-pay | Admitting: Oncology

## 2016-10-04 DIAGNOSIS — C50411 Malignant neoplasm of upper-outer quadrant of right female breast: Secondary | ICD-10-CM

## 2016-10-10 DIAGNOSIS — Z23 Encounter for immunization: Secondary | ICD-10-CM | POA: Diagnosis not present

## 2016-10-10 DIAGNOSIS — I10 Essential (primary) hypertension: Secondary | ICD-10-CM | POA: Diagnosis not present

## 2016-10-10 DIAGNOSIS — R05 Cough: Secondary | ICD-10-CM | POA: Diagnosis not present

## 2016-10-28 ENCOUNTER — Other Ambulatory Visit (HOSPITAL_COMMUNITY): Payer: Self-pay | Admitting: Hematology & Oncology

## 2016-11-02 DIAGNOSIS — G4733 Obstructive sleep apnea (adult) (pediatric): Secondary | ICD-10-CM | POA: Diagnosis not present

## 2016-11-23 ENCOUNTER — Encounter (HOSPITAL_COMMUNITY): Payer: Self-pay | Admitting: Hematology & Oncology

## 2016-11-23 ENCOUNTER — Encounter (HOSPITAL_COMMUNITY): Payer: Commercial Managed Care - HMO

## 2016-11-23 ENCOUNTER — Encounter (HOSPITAL_COMMUNITY): Payer: Commercial Managed Care - HMO | Attending: Hematology & Oncology | Admitting: Hematology & Oncology

## 2016-11-23 VITALS — BP 141/75 | HR 78 | Temp 98.5°F | Resp 20 | Wt 243.9 lb

## 2016-11-23 DIAGNOSIS — Z79899 Other long term (current) drug therapy: Secondary | ICD-10-CM | POA: Diagnosis not present

## 2016-11-23 DIAGNOSIS — M8580 Other specified disorders of bone density and structure, unspecified site: Secondary | ICD-10-CM | POA: Diagnosis not present

## 2016-11-23 DIAGNOSIS — C50411 Malignant neoplasm of upper-outer quadrant of right female breast: Secondary | ICD-10-CM | POA: Diagnosis not present

## 2016-11-23 DIAGNOSIS — Z170 Estrogen receptor positive status [ER+]: Secondary | ICD-10-CM | POA: Diagnosis not present

## 2016-11-23 DIAGNOSIS — Z79811 Long term (current) use of aromatase inhibitors: Secondary | ICD-10-CM | POA: Diagnosis not present

## 2016-11-23 LAB — COMPREHENSIVE METABOLIC PANEL
ALBUMIN: 4.1 g/dL (ref 3.5–5.0)
ALT: 19 U/L (ref 14–54)
ANION GAP: 8 (ref 5–15)
AST: 23 U/L (ref 15–41)
Alkaline Phosphatase: 54 U/L (ref 38–126)
BILIRUBIN TOTAL: 0.6 mg/dL (ref 0.3–1.2)
BUN: 17 mg/dL (ref 6–20)
CO2: 26 mmol/L (ref 22–32)
Calcium: 9.2 mg/dL (ref 8.9–10.3)
Chloride: 104 mmol/L (ref 101–111)
Creatinine, Ser: 0.72 mg/dL (ref 0.44–1.00)
GLUCOSE: 73 mg/dL (ref 65–99)
POTASSIUM: 4.3 mmol/L (ref 3.5–5.1)
Sodium: 138 mmol/L (ref 135–145)
TOTAL PROTEIN: 7 g/dL (ref 6.5–8.1)

## 2016-11-23 NOTE — Progress Notes (Signed)
[place] Progress Note  Patient Care Team: Asencion Noble, MD as PCP - General (Internal Medicine) Daneil Dolin, MD (Gastroenterology)  CHIEF COMPLAINTS/PURPOSE OF CONSULTATION:  Right Partial mastectomy with needle localization and axillary sentinel node on 03/18/2015 with Dr. 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  ER 100%, PR 100%, Ki-67 18%, HER2 NEGATIVE      03/18/2015 Procedure    Breast, lumpectomy, right breast mass by Dr. Arnoldo Morale       03/18/2015 Pathology Results    - INVASIVE DUCTAL CARCINOMA, NOTTINGHAM COMBINED HISTOLOGIC GRADE II, 0.9 CM. - LOW GRADE DUCTAL CARCINOMA IN SITU. - RESECTION MARGINS, NEGATIVE FOR ATYPIA OR MALIGNANCY.      03/31/2015 Initial Diagnosis    Breast cancer      04/02/2015 Oncotype testing    Recurrence score of 4, low-risk.      04/30/2015 - 06/05/2015 Radiation Therapy    Right breast 50 Gy at 2 Gy/fraction x 25 fractions.  Dr. Pablo Ledger      06/08/2015 Imaging    DEXA-  BMD as determined from Femur Neck Right is 0.843 g/cm2 with a T-Score of -1.4. This patient is considered osteopenic according to [facility]) criteria.      06/10/2015 -  Anti-estrogen oral therapy    Arimidex       HISTORY OF PRESENTING ILLNESS:  Crystal [name] 75 y.o. 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 She has done well from a surgical perspective. She denies fever or chills. She denies any pain. She has completed XRT. She continues on arimidex, calcium and vitamin D. She is on prolia.  Ms. Donaghey is unaccompanied. She says that she feels good today.   She says  that a few weeks ago she started noticing that her fingers are numb when she's holding something. She doesn't drop anything and can still button her shirts, but she can't feel what is in her hands. It is not better or worse at night. She notes that it is intermittent. She did mention this to her PCP.  She states that her arthritis has gotten worse in her hands. She wonders if this is related.    She has been taking her calcium daily.   She denies any other complaints at this time. She will be due for mammogram again in February.   MEDICAL HISTORY:  Past Medical History:  Diagnosis Date  . Breast cancer ([place]) 03/21/15   right  . Chronic fatigue   . Colitis, ischemic ([place]) 08/2007, 05/2011   Last colonoscopy/bx by Dr Rourk->(08/31/07) descending colon  . Depression   . Diverticulosis   . Enlarged thyroid   . GERD (gastroesophageal reflux disease)   . HTN (hypertension)   . OA (osteoarthritis)   . Obesity   . PONV (postoperative nausea and vomiting)   . Sleep apnea    cpap  . Spinal stenosis   . Tubular adenoma of colon 08/2007    SURGICAL HISTORY: Past Surgical History:  Procedure Laterality Date  . ABDOMINAL HYSTERECTOMY     partial  . BREAST CYST EXCISION  benign, left x2, right x1  . CATARACT EXTRACTION W/PHACO  12/01/2011   Procedure: CATARACT EXTRACTION PHACO AND INTRAOCULAR LENS PLACEMENT (IOC);  Surgeon: Tonny Branch;  Location: [facility] ORS;  Service: Ophthalmology;  Laterality: Right;  CDE=14.87  . CATARACT EXTRACTION W/PHACO  02/06/2012   Procedure: CATARACT EXTRACTION PHACO AND INTRAOCULAR LENS PLACEMENT (IOC);  Surgeon: Tonny Branch, MD;  Location: [facility] ORS;  Service: Ophthalmology;  Laterality: Left;  CDE 13.00  . COLONOSCOPY  Sept 2008   Last colonoscopy/bx by Dr Rourk->(08/31/07) descending colon TUBULAR ADENOMA  . COLONOSCOPY  10/25/2012   Procedure: COLONOSCOPY;  Surgeon: Daneil Dolin, MD;  Location: [facility] ENDO SUITE;  Service: Endoscopy;  Laterality: N/A;  10:15-changed to  10:30 Darius Bump notified  . PARTIAL MASTECTOMY WITH NEEDLE LOCALIZATION AND AXILLARY SENTINEL LYMPH NODE BX Right 03/18/2015   Procedure: PARTIAL MASTECTOMY AFTER NEEDLE LOCALIZATION AND AXILLARY SENTINEL LYMPH NODE BX;  Surgeon: Aviva Signs Md, MD;  Location: [facility] ORS;  Service: General;  Laterality: Right;  . TUBAL LIGATION  1970   hysterectomy    SOCIAL HISTORY: Social History   Social History  . Marital status: Widowed    Spouse name: N/A  . Number of children: 2  . Years of education: N/A   Occupational History  . Lab Tech-retired    Social History Main Topics  . Smoking status: Never Smoker  . Smokeless tobacco: Never Used  . Alcohol use No  . Drug use: No  . Sexual activity: No   Other Topics Concern  . Not on file   Social History Narrative  . No narrative on file   she has 3 children. She is widowed, her husband died from prostate cancer 6 years ago. They were married for 49 years. She has 5 grandchildren. She was born here in [place]. She is a nonsmoker, never smoker. She does not drink alcohol. She enjoys reading, puzzles, and playing solitaire  FAMILY HISTORY: Family History  Problem Relation Age of Onset  . Cancer Brother 48  . Coronary artery disease Brother   . Diabetes Sister   . Heart disease Father   . Heart disease Brother   . Anesthesia problems Neg Hx   . Hypotension Neg Hx   . Pseudochol deficiency Neg Hx   . Malignant hyperthermia Neg Hx   . Colon cancer Neg Hx    indicated that the status of her father is unknown. She indicated that the status of her sister is unknown. She indicated that only one of her five brothers is alive. She indicated that the status of her neg hx is unknown.   2 brothers are deceased, one at the age of 28 from metastatic cancer of unknown primary. A second brother died from complications of heart disease. He was 77. She has one sister who is alive and has arthritis but is otherwise healthy. Her mother died at the age of  100 from age-related causes. Father died at 37 from complications of heart disease.   ALLERGIES:  has No Known Allergies.  MEDICATIONS:  Current Outpatient Prescriptions  Medication Sig Dispense Refill  . acetaminophen (TYLENOL ARTHRITIS PAIN) 650 MG CR tablet Take 1,300 mg by mouth 2 (two) times daily.     Marland Kitchen ADVAIR DISKUS 100-50 MCG/DOSE AEPB INHALE 1 PUFF INTO THE LUNGS TWICE DAILY 60 each 5  . amLODipine (NORVASC) 10 MG tablet Take 10 mg by mouth daily.     Marland Kitchen anastrozole (ARIMIDEX) 1 MG tablet TAKE 1 TABLET EVERY DAY 90 tablet  1  . aspirin EC 81 MG tablet Take 81 mg by mouth daily.      Marland Kitchen buPROPion (WELLBUTRIN SR) 150 MG 12 hr tablet Take 150 mg by mouth 2 (two) times daily.     . Calcium Citrate-Vitamin D (CALCIUM + D PO) Take by mouth.    . cycloSPORINE (RESTASIS) 0.05 % ophthalmic emulsion Place 1 drop into both eyes 2 (two) times daily. For dry eyes    . FLUoxetine (PROZAC) 20 MG capsule Take 60 mg by mouth daily.     . fluticasone (FLONASE) 50 MCG/ACT nasal spray Place 2 sprays into both nostrils daily.     Marland Kitchen HYDROMET 5-1.5 MG/5ML syrup Take 5 mLs by mouth 2 (two) times daily as needed for cough. Reported on 04/25/2016    . loratadine (CLARITIN) 10 MG tablet Take 10 mg by mouth daily.      Marland Kitchen losartan-hydrochlorothiazide (HYZAAR) 100-25 MG per tablet Take 1 tablet by mouth daily.    . pantoprazole (PROTONIX) 40 MG tablet TAKE 1 TABLET BY MOUTH TWICE DAILY 60 tablet 5  . simvastatin (ZOCOR) 10 MG tablet Take 10 mg by mouth daily.    . Vitamin D, Ergocalciferol, (DRISDOL) 50000 UNITS CAPS capsule Take 1 capsule (50,000 Units total) by mouth once a week. 12 capsule 0  . Vitamin D, Ergocalciferol, (DRISDOL) 50000 units CAPS capsule TAKE 1 CAPSULE BY MOUTH ONCE A WEEK 12 capsule 0   No current facility-administered medications for this visit.     Review of Systems  Constitutional: Negative.   HENT: Negative.   Eyes: Negative.   Respiratory: Negative.   Cardiovascular: Negative.     Gastrointestinal: Negative.   Genitourinary: Negative.   Musculoskeletal: Negative.        Arthritis. Intermittent hip pain, chronic.  Skin: Negative.   Neurological: Positive for tingling.       Tingling and numbness in fingers.   Endo/Heme/Allergies: Negative.   Psychiatric/Behavioral: Negative.   All other systems reviewed and are negative.   14 point review of systems was performed and is negative except as detailed under history of present illness and above   PHYSICAL EXAMINATION:  ECOG PERFORMANCE STATUS: 0 - Asymptomatic  Vitals:   11/23/16 1111  BP: (!) 141/75  Pulse: 78  Resp: 20  Temp: 98.5 F (36.9 C)   Filed Weights   11/23/16 1111  Weight: 243 lb 14.4 oz (110.6 kg)     Physical Exam  Constitutional: She is oriented to person, place, and time and well-developed, well-nourished, and in no distress.  Pt was able to get on exam table without assistance.   HENT:  Head: Normocephalic and atraumatic.  Eyes: EOM are normal. Pupils are equal, round, and reactive to light.  Neck: Normal range of motion. Neck supple.  Cardiovascular: Normal rate, regular rhythm and normal heart sounds.   Pulmonary/Chest: Effort normal and breath sounds normal.  Abdominal: Soft. Bowel sounds are normal.  Musculoskeletal: Normal range of motion.  Neurological: She is alert and oriented to person, place, and time. Gait normal.  Skin: Skin is warm and dry.  Psychiatric: Mood, memory, affect and judgment normal.  Nursing note and vitals reviewed.    LABORATORY DATA:  I have reviewed the data as listed Lab Results  Component Value Date   WBC 4.8 04/28/2016   HGB 12.9 04/28/2016   HCT 38.2 04/28/2016   MCV 95.0 04/28/2016   PLT 198 04/28/2016     Chemistry      Component Value  Date/Time   NA 138 11/23/2016 1033   K 4.3 11/23/2016 1033   CL 104 11/23/2016 1033   CO2 26 11/23/2016 1033   BUN 17 11/23/2016 1033   CREATININE 0.72 11/23/2016 1033      Component Value  Date/Time   CALCIUM 9.2 11/23/2016 1033   ALKPHOS 54 11/23/2016 1033   AST 23 11/23/2016 1033   ALT 19 11/23/2016 1033   BILITOT 0.6 11/23/2016 1033       RADIOGRAPHIC STUDIES: I have personally reviewed the radiological images as listed and agreed with the findings in the report.  Study Result   CLINICAL DATA:  Chest pain, primarily in the left scapular region. History of right breast carcinoma  EXAM: CHEST  2 VIEW  COMPARISON:  September 17, 2015 and March 16, 2015  FINDINGS: Scarring in the lingula is again noted. There is no edema or consolidation. Heart is borderline enlarged with pulmonary vascularity within normal limits, stable. No adenopathy evident. There is degenerative change in the thoracic spine with increased kyphosis. No blastic or lytic bone lesions are evident. There is a focal area of calcification in the right carotid artery.  IMPRESSION: Lingular scarring. No edema or consolidation. Stable cardiac silhouette. Focal area of calcification in the right carotid artery.   Electronically Signed   By: Lowella Grip III M.D.   On: 04/28/2016 11:01       ASSESSMENT & PLAN:   Stage I, ER+. 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 Recurrence score of 4 with 10 year risk of distant recurrence 5% with Tamoxifen alone Osteopenia Arimidex, calcium, vitamin D and Prolia Joint Pain Intermittent Hand numbness  Pleasant 75 year old female with a stage I ER positive, PR positive, and HER-2 negative carcinoma of the breast. Largest tumor dimension was 0.9 cm. She is up to date on mammography. She is currently doing well.   She continues on arimidex 1 mg daily. She is doing well with no complaints. She is on calcium and vitamin D,prolia. She has excellent dental care.  I have ordered a repeat mammography for February 2018.  She is due for Prolia in February.   If the numbness in  her fingers doesn't get better, then she will call the clinic and we will order a NCS.   Next bone density is due in June of 2018.   She does not need any refills at this time.   She will return for a follow up in 4 months. We will do a breast exam at this next visit.  All questions were answered. The patient knows to call the clinic with any problems, questions or concerns.    This document serves as a record of services personally performed by Ancil Linsey, MD. It was created on her behalf by Martinique Casey, a trained medical scribe. The creation of this record is based on the scribe's personal observations and the provider's statements to them. This document has been checked and approved by the attending provider.  I have reviewed the above documentation for accuracy and completeness, and I agree with the above.  This note was electronically signed.   Kelby Fam. Whitney Muse, MD

## 2016-11-23 NOTE — Patient Instructions (Addendum)
[place] at [address] [facility] Discharge Instructions  RECOMMENDATIONS MADE BY THE CONSULTANT AND ANY TEST RESULTS WILL BE SENT TO YOUR REFERRING PHYSICIAN.  You were seen today by Dr. Whitney Muse. Continue taking Prolia injections. Get Mammogram done in February. Return in 3 months for follow up.    Thank you for choosing [place] at [facility] to provide your oncology and hematology care.  To afford each patient quality time with our provider, please arrive at least 15 minutes before your scheduled appointment time.   Beginning January 23rd 2017 lab work for the [facility] will be done in the  Main lab at [facility] on 1st floor. If you have a lab appointment with the [facility] please come in thru the  Main Entrance and check in at the main information desk  You need to re-schedule your appointment should you arrive 10 or more minutes late.  We strive to give you quality time with our providers, and arriving late affects you and other patients whose appointments are after yours.  Also, if you no show three or more times for appointments you may be dismissed from the clinic at the providers discretion.     Again, thank you for choosing [facility].  Our hope is that these requests will decrease the amount of time that you wait before being seen by our physicians.       _____________________________________________________________  Should you have questions after your visit to [facility], please contact our office at [phone] between the hours of 8:30 a.m. and 4:30 p.m.  Voicemails left after 4:30 p.m. will not be returned until the following business day.  For prescription refill requests, have your pharmacy contact our office.         Resources For Cancer Patients and their Caregivers ? American Cancer Society: Can assist with transportation, wigs, general needs, runs Look Good Feel Better.         647-858-5782 ? Cancer Care: Provides financial assistance, online support groups, medication/co-pay assistance.  1-800-813-HOPE (907) 832-5070) ? [facility] Assists [place] cancer patients and their families through emotional , educational and financial support.  857-693-4067 ? [place] DSS Where to apply for food stamps, Medicaid and utility assistance. 701-058-6808 ? RCATS: Transportation to medical appointments. 617 385 9458 ? Social Security Administration: May apply for disability if have a Stage IV cancer. 323-467-0694 (902) 684-4547 ? [employer], Disability and Transit Services: Assists with nutrition, care and transit needs. [place] Support Programs: @10RELATIVEDAYS @ > Cancer Support Group  2nd Tuesday of the month 1pm-2pm, Journey Room  > Creative Journey  3rd Tuesday of the month 1130am-1pm, Journey Room  > Look Good Feel Better  1st Wednesday of the month 10am-12 noon, Journey Room (Call [facility] to register [phone])

## 2016-12-02 DIAGNOSIS — G4733 Obstructive sleep apnea (adult) (pediatric): Secondary | ICD-10-CM | POA: Diagnosis not present

## 2016-12-07 DIAGNOSIS — G4733 Obstructive sleep apnea (adult) (pediatric): Secondary | ICD-10-CM | POA: Diagnosis not present

## 2016-12-13 DIAGNOSIS — D3132 Benign neoplasm of left choroid: Secondary | ICD-10-CM | POA: Diagnosis not present

## 2016-12-13 DIAGNOSIS — H26491 Other secondary cataract, right eye: Secondary | ICD-10-CM | POA: Diagnosis not present

## 2016-12-13 DIAGNOSIS — H43811 Vitreous degeneration, right eye: Secondary | ICD-10-CM | POA: Diagnosis not present

## 2016-12-13 DIAGNOSIS — Z961 Presence of intraocular lens: Secondary | ICD-10-CM | POA: Diagnosis not present

## 2017-01-03 DIAGNOSIS — H26492 Other secondary cataract, left eye: Secondary | ICD-10-CM | POA: Diagnosis not present

## 2017-01-03 DIAGNOSIS — H26493 Other secondary cataract, bilateral: Secondary | ICD-10-CM | POA: Diagnosis not present

## 2017-01-03 DIAGNOSIS — H26491 Other secondary cataract, right eye: Secondary | ICD-10-CM | POA: Diagnosis not present

## 2017-01-18 DIAGNOSIS — H02422 Myogenic ptosis of left eyelid: Secondary | ICD-10-CM | POA: Diagnosis not present

## 2017-01-20 ENCOUNTER — Other Ambulatory Visit: Payer: Self-pay | Admitting: Pulmonary Disease

## 2017-01-20 ENCOUNTER — Other Ambulatory Visit (HOSPITAL_COMMUNITY): Payer: Self-pay | Admitting: Hematology & Oncology

## 2017-01-20 MED ORDER — FLUTICASONE-SALMETEROL 100-50 MCG/DOSE IN AEPB: INHALATION_SPRAY | RESPIRATORY_TRACT | 1 refills | Status: DC

## 2017-01-20 NOTE — Telephone Encounter (Signed)
Received faxed refill request from [facility] in [place] for the Advair 100-50mcg Last ov 5.1.17, follow up in 6 months - no appt has been scheduled Will give 1 refill with note to pharmacy that pt is overdue for appt

## 2017-01-30 ENCOUNTER — Other Ambulatory Visit (HOSPITAL_COMMUNITY): Payer: Self-pay | Admitting: *Deleted

## 2017-01-30 DIAGNOSIS — Z170 Estrogen receptor positive status [ER+]: Principal | ICD-10-CM

## 2017-01-30 DIAGNOSIS — C50411 Malignant neoplasm of upper-outer quadrant of right female breast: Secondary | ICD-10-CM

## 2017-01-31 ENCOUNTER — Encounter (HOSPITAL_COMMUNITY): Payer: Medicare HMO

## 2017-01-31 ENCOUNTER — Encounter (HOSPITAL_COMMUNITY): Payer: Medicare HMO | Attending: Oncology

## 2017-01-31 ENCOUNTER — Encounter (HOSPITAL_COMMUNITY): Payer: Self-pay

## 2017-01-31 VITALS — BP 158/77 | HR 84 | Temp 98.3°F | Resp 20

## 2017-01-31 DIAGNOSIS — Z170 Estrogen receptor positive status [ER+]: Principal | ICD-10-CM

## 2017-01-31 DIAGNOSIS — C50411 Malignant neoplasm of upper-outer quadrant of right female breast: Secondary | ICD-10-CM

## 2017-01-31 DIAGNOSIS — M8588 Other specified disorders of bone density and structure, other site: Secondary | ICD-10-CM

## 2017-01-31 DIAGNOSIS — M8580 Other specified disorders of bone density and structure, unspecified site: Secondary | ICD-10-CM

## 2017-01-31 LAB — CBC WITH DIFFERENTIAL/PLATELET
Basophils Absolute: 0 10*3/uL (ref 0.0–0.1)
Basophils Relative: 0 %
EOS PCT: 4 %
Eosinophils Absolute: 0.2 10*3/uL (ref 0.0–0.7)
HEMATOCRIT: 40.1 % (ref 36.0–46.0)
Hemoglobin: 13.7 g/dL (ref 12.0–15.0)
LYMPHS ABS: 1.5 10*3/uL (ref 0.7–4.0)
LYMPHS PCT: 30 %
MCH: 32.5 pg (ref 26.0–34.0)
MCHC: 34.2 g/dL (ref 30.0–36.0)
MCV: 95.2 fL (ref 78.0–100.0)
MONO ABS: 0.5 10*3/uL (ref 0.1–1.0)
MONOS PCT: 10 %
Neutro Abs: 2.8 10*3/uL (ref 1.7–7.7)
Neutrophils Relative %: 56 %
Platelets: 239 10*3/uL (ref 150–400)
RBC: 4.21 MIL/uL (ref 3.87–5.11)
RDW: 13.2 % (ref 11.5–15.5)
WBC: 5 10*3/uL (ref 4.0–10.5)

## 2017-01-31 LAB — COMPREHENSIVE METABOLIC PANEL
ALT: 21 U/L (ref 14–54)
AST: 25 U/L (ref 15–41)
Albumin: 4.2 g/dL (ref 3.5–5.0)
Alkaline Phosphatase: 66 U/L (ref 38–126)
Anion gap: 9 (ref 5–15)
BILIRUBIN TOTAL: 0.8 mg/dL (ref 0.3–1.2)
BUN: 18 mg/dL (ref 6–20)
CHLORIDE: 105 mmol/L (ref 101–111)
CO2: 23 mmol/L (ref 22–32)
Calcium: 9.4 mg/dL (ref 8.9–10.3)
Creatinine, Ser: 0.76 mg/dL (ref 0.44–1.00)
Glucose, Bld: 112 mg/dL — ABNORMAL HIGH (ref 65–99)
POTASSIUM: 4.1 mmol/L (ref 3.5–5.1)
Sodium: 137 mmol/L (ref 135–145)
TOTAL PROTEIN: 7.2 g/dL (ref 6.5–8.1)

## 2017-01-31 MED ORDER — DENOSUMAB 60 MG/ML ~~LOC~~ SOLN
60.0000 mg | Freq: Once | SUBCUTANEOUS | Status: AC
  Administered 2017-01-31: 60 mg via SUBCUTANEOUS
  Filled 2017-01-31: qty 1

## 2017-01-31 NOTE — Patient Instructions (Signed)
[facility] at [facility] Discharge Instructions  RECOMMENDATIONS MADE BY THE CONSULTANT AND ANY TEST RESULTS WILL BE SENT TO YOUR REFERRING PHYSICIAN.  Prolia injection today. Return as scheduled.   Thank you for choosing [facility] at [facility] to provide your oncology and hematology care.  To afford each patient quality time with our provider, please arrive at least 15 minutes before your scheduled appointment time.    If you have a lab appointment with the Cancer Center please come in thru the  Main Entrance and check in at the main information desk  You need to re-schedule your appointment should you arrive 10 or more minutes late.  We strive to give you quality time with our providers, and arriving late affects you and other patients whose appointments are after yours.  Also, if you no show three or more times for appointments you may be dismissed from the clinic at the providers discretion.     Again, thank you for choosing [facility].  Our hope is that these requests will decrease the amount of time that you wait before being seen by our physicians.       _____________________________________________________________  Should you have questions after your visit to [facility], please contact our office at [phone] between the hours of 8:30 a.m. and 4:30 p.m.  Voicemails left after 4:30 p.m. will not be returned until the following business day.  For prescription refill requests, have your pharmacy contact our office.       Resources For Cancer Patients and their Caregivers ? American Cancer Society: Can assist with transportation, wigs, general needs, runs Look Good Feel Better.        1-888-227-6333 ? Cancer Care: Provides financial assistance, online support groups, medication/co-pay assistance.  1-800-813-HOPE (4673) ? [facility] Assists [place] cancer patients and  their families through emotional , educational and financial support.  336-427-4357 ? [place] DSS Where to apply for food stamps, Medicaid and utility assistance. 336-342-1394 ? RCATS: Transportation to medical appointments. 336-347-2287 ? Social Security Administration: May apply for disability if have a Stage IV cancer. 336-342-7796 1-800-772-1213 ? [place] Aging, Disability and Transit Services: Assists with nutrition, care and transit needs. 336-349-2343  Cancer Center Support Programs: @10RELATIVEDAYS@ > Cancer Support Group  2nd Tuesday of the month 1pm-2pm, Journey Room  > Creative Journey  3rd Tuesday of the month 1130am-1pm, Journey Room  > Look Good Feel Better  1st Wednesday of the month 10am-12 noon, Journey Room (Call American Cancer Society to register 1-800-395-5775)   

## 2017-01-31 NOTE — Progress Notes (Signed)
Crystal [name] presents today for injection per the provider's orders.  Prolia administration without incident; see MAR for injection details.  Patient tolerated procedure well and without incident.  No questions or complaints noted at this time.

## 2017-02-01 LAB — VITAMIN D 25 HYDROXY (VIT D DEFICIENCY, FRACTURES): Vit D, 25-Hydroxy: 43.6 ng/mL (ref 30.0–100.0)

## 2017-02-03 DIAGNOSIS — F329 Major depressive disorder, single episode, unspecified: Secondary | ICD-10-CM | POA: Diagnosis not present

## 2017-02-03 DIAGNOSIS — E6609 Other obesity due to excess calories: Secondary | ICD-10-CM | POA: Diagnosis not present

## 2017-02-03 DIAGNOSIS — M1990 Unspecified osteoarthritis, unspecified site: Secondary | ICD-10-CM | POA: Diagnosis not present

## 2017-02-03 DIAGNOSIS — I10 Essential (primary) hypertension: Secondary | ICD-10-CM | POA: Diagnosis not present

## 2017-02-03 DIAGNOSIS — E785 Hyperlipidemia, unspecified: Secondary | ICD-10-CM | POA: Diagnosis not present

## 2017-02-03 DIAGNOSIS — K219 Gastro-esophageal reflux disease without esophagitis: Secondary | ICD-10-CM | POA: Diagnosis not present

## 2017-02-03 DIAGNOSIS — E049 Nontoxic goiter, unspecified: Secondary | ICD-10-CM | POA: Diagnosis not present

## 2017-02-14 ENCOUNTER — Ambulatory Visit (HOSPITAL_COMMUNITY)
Admission: RE | Admit: 2017-02-14 | Discharge: 2017-02-14 | Disposition: A | Discharge status: Home / Self Care | Payer: Medicare HMO | Source: Ambulatory Visit | Attending: Hematology & Oncology | Admitting: Hematology & Oncology

## 2017-02-14 DIAGNOSIS — Z853 Personal history of malignant neoplasm of breast: Secondary | ICD-10-CM | POA: Insufficient documentation

## 2017-02-14 DIAGNOSIS — R928 Other abnormal and inconclusive findings on diagnostic imaging of breast: Secondary | ICD-10-CM | POA: Diagnosis not present

## 2017-02-14 DIAGNOSIS — Z9889 Other specified postprocedural states: Secondary | ICD-10-CM | POA: Insufficient documentation

## 2017-02-14 DIAGNOSIS — C50411 Malignant neoplasm of upper-outer quadrant of right female breast: Secondary | ICD-10-CM

## 2017-02-14 DIAGNOSIS — Z170 Estrogen receptor positive status [ER+]: Secondary | ICD-10-CM

## 2017-02-16 DIAGNOSIS — E785 Hyperlipidemia, unspecified: Secondary | ICD-10-CM | POA: Diagnosis not present

## 2017-02-16 DIAGNOSIS — Z6841 Body Mass Index (BMI) 40.0 and over, adult: Secondary | ICD-10-CM | POA: Diagnosis not present

## 2017-02-16 DIAGNOSIS — Z0000 Encounter for general adult medical examination without abnormal findings: Secondary | ICD-10-CM | POA: Diagnosis not present

## 2017-02-16 DIAGNOSIS — I10 Essential (primary) hypertension: Secondary | ICD-10-CM | POA: Diagnosis not present

## 2017-02-23 ENCOUNTER — Encounter (HOSPITAL_COMMUNITY): Payer: Commercial Managed Care - HMO | Attending: Oncology | Admitting: Adult Health

## 2017-02-23 ENCOUNTER — Encounter (HOSPITAL_COMMUNITY): Payer: Self-pay | Admitting: Adult Health

## 2017-02-23 VITALS — BP 157/65 | HR 97 | Temp 97.9°F | Resp 18 | Ht 63.0 in | Wt 235.8 lb

## 2017-02-23 DIAGNOSIS — C50411 Malignant neoplasm of upper-outer quadrant of right female breast: Secondary | ICD-10-CM | POA: Diagnosis not present

## 2017-02-23 DIAGNOSIS — M8580 Other specified disorders of bone density and structure, unspecified site: Secondary | ICD-10-CM

## 2017-02-23 DIAGNOSIS — Z170 Estrogen receptor positive status [ER+]: Secondary | ICD-10-CM | POA: Insufficient documentation

## 2017-02-23 DIAGNOSIS — Z79811 Long term (current) use of aromatase inhibitors: Secondary | ICD-10-CM | POA: Diagnosis not present

## 2017-02-23 NOTE — Progress Notes (Signed)
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  ER 100%, PR 100%, Ki-67 18%, HER2 NEGATIVE      03/18/2015 Procedure    Breast, lumpectomy, right breast mass by Dr. Arnoldo Morale       03/18/2015 Pathology Results    - INVASIVE DUCTAL CARCINOMA, NOTTINGHAM COMBINED HISTOLOGIC GRADE II, 0.9 CM. - LOW GRADE DUCTAL CARCINOMA IN SITU. - RESECTION MARGINS, NEGATIVE FOR ATYPIA OR MALIGNANCY.      03/31/2015 Initial Diagnosis    Breast cancer      04/02/2015 Oncotype testing    Recurrence score of 4, low-risk.      04/30/2015 - 06/05/2015 Radiation Therapy    Right breast 50 Gy at 2 Gy/fraction x 25 fractions.  Dr. Pablo Ledger      06/08/2015 Imaging    DEXA-  BMD as determined from Femur Neck Right is 0.843 g/cm2 with a T-Score of -1.4. This patient is considered osteopenic according to [facility]) criteria.      06/10/2015 -  Anti-estrogen oral therapy    Arimidex        HISTORY OF PRESENT ILLNESS:  (From Dr. Donald Pore last note on 11/23/16)      INTERVAL HISTORY:  Ms. Bruneau is 76 y.o. female who returns for follow-up of Stage IA right breast cancer; currently on Arimidex.   Denies any breast complaints. Her last mammogram was on 02/14/17 and was negative.  She is tolerating Arimidex well; denies hot flashes or vaginal dryness.  Endorses occasional arthralgias, particularly to her neck, back, and knees.  She takes OTC Tylenol Arthritis, which is helpful.  She does not sleep well at night; she  attributes some of this to her neck pain and being able to get comfortable sometimes as a result.  She generally takes the Tylenol Arthritis in the mornings and at bedtime.    She has been taking prescription-strength vitamin D 50,000 IU once/week; she wants to know if she needs to continue taking this prescription.    Her appetite is "too good."  Her energy levels are pretty good; she feels "lazy" sometimes. She is looking forward to getting back into line dancing soon since her left knee pain is improving.    Ms. Sykora did share that one of her daughters was recently diagnosed with breast cancer; she has recently completed radiation therapy and is doing well.     REVIEW OF SYSTEMS:  Review of Systems  Constitutional: Negative for appetite change, chills and fever.  HENT:  Negative.  Negative for lump/mass and nosebleeds.   Eyes: Negative.   Respiratory: Negative.  Negative for cough and shortness of breath.   Cardiovascular: Negative.  Negative for chest pain and leg swelling.  Gastrointestinal: Negative.  Negative for abdominal pain, blood in stool, constipation, diarrhea, nausea and vomiting.  Endocrine: Negative.   Genitourinary: Negative.  Negative for dysuria, hematuria and vaginal bleeding.   Musculoskeletal: Positive for arthralgias, back pain and neck pain.  Skin: Negative.  Negative for rash.  Neurological: Negative.  Negative for dizziness and headaches.  Hematological: Negative.  Negative for adenopathy. Does not bruise/bleed easily.  Psychiatric/Behavioral: Negative.  Negative for depression and sleep disturbance. The patient is not nervous/anxious.      PAST MEDICAL/SURGICAL HISTORY:  Past Medical History:  Diagnosis Date  . Breast cancer ([facility]) 03/21/15   right  . Chronic fatigue   . Colitis, ischemic ([facility]) 08/2007, 05/2011   Last colonoscopy/bx by Dr Rourk->(08/31/07) descending colon  . Depression   . Diverticulosis   . Enlarged thyroid   . GERD (gastroesophageal  reflux disease)   . HTN (hypertension)   . OA (osteoarthritis)   . Obesity   . PONV (postoperative nausea and vomiting)   . Sleep apnea    cpap  . Spinal stenosis   . Tubular adenoma of colon 08/2007   Past Surgical History:  Procedure Laterality Date  . ABDOMINAL HYSTERECTOMY     partial  . BREAST CYST EXCISION     benign, left x2, right x1  . CATARACT EXTRACTION W/PHACO  12/01/2011   Procedure: CATARACT EXTRACTION PHACO AND INTRAOCULAR LENS PLACEMENT (IOC);  Surgeon: Tonny Branch;  Location: [facility];  Service: Ophthalmology;  Laterality: Right;  CDE=14.87  . CATARACT EXTRACTION W/PHACO  02/06/2012   Procedure: CATARACT EXTRACTION PHACO AND INTRAOCULAR LENS PLACEMENT (IOC);  Surgeon: Tonny Branch, MD;  Location: [facility] ORS;  Service: Ophthalmology;  Laterality: Left;  CDE 13.00  . COLONOSCOPY  Sept 2008   Last colonoscopy/bx by Dr Rourk->(08/31/07) descending colon TUBULAR ADENOMA  . COLONOSCOPY  10/25/2012   Procedure: COLONOSCOPY;  Surgeon: Daneil Dolin, MD;  Location: [facility] SUITE;  Service: Endoscopy;  Laterality: N/A;  10:15-changed to 10:30 Darius Bump notified  . PARTIAL MASTECTOMY WITH NEEDLE LOCALIZATION AND AXILLARY SENTINEL LYMPH NODE BX Right 03/18/2015   Procedure: PARTIAL MASTECTOMY AFTER NEEDLE LOCALIZATION AND AXILLARY SENTINEL LYMPH NODE BX;  Surgeon: Aviva Signs Md, MD;  Location: [facility];  Service: General;  Laterality: Right;  . TUBAL LIGATION  1970   hysterectomy     SOCIAL HISTORY:  Social History   Social History  . Marital status: Widowed    Spouse name: N/A  . Number of children: 2  . Years of education: N/A   Occupational History  . Lab Tech-retired    Social History Main Topics  . Smoking status: Never Smoker  . Smokeless tobacco: Never Used  . Alcohol use No  . Drug use: No  . Sexual activity: No   Other Topics Concern  . Not on file   Social History Narrative  . No narrative on file    FAMILY HISTORY:  Family History  Problem Relation Age of  Onset  . Cancer Brother 87  . Coronary artery disease Brother   . Diabetes Sister   . Heart disease Father   . Heart disease Brother   . Anesthesia problems Neg Hx   . Hypotension Neg Hx   . Pseudochol deficiency Neg Hx   . Malignant hyperthermia Neg Hx   . Colon cancer Neg Hx     CURRENT MEDICATIONS:  Outpatient Encounter Prescriptions as of 02/23/2017  Medication Sig Note  . acetaminophen (TYLENOL ARTHRITIS PAIN) 650 MG CR tablet Take 1,300 mg by mouth 2 (two) times daily.    Marland Kitchen amLODipine (NORVASC) 10 MG tablet Take 10 mg by mouth daily.    Marland Kitchen anastrozole (ARIMIDEX) 1 MG tablet TAKE 1 TABLET EVERY DAY   . aspirin EC 81 MG tablet Take 81 mg  by mouth daily.     Marland Kitchen buPROPion (WELLBUTRIN SR) 150 MG 12 hr tablet Take 150 mg by mouth 2 (two) times daily.    . Calcium Citrate-Vitamin D (CALCIUM + D PO) Take by mouth.   . cycloSPORINE (RESTASIS) 0.05 % ophthalmic emulsion Place 1 drop into both eyes 2 (two) times daily. For dry eyes   . FLUoxetine (PROZAC) 20 MG capsule Take 60 mg by mouth daily.    . fluticasone (FLONASE) 50 MCG/ACT nasal spray Place 2 sprays into both nostrils daily.    . Fluticasone-Salmeterol (ADVAIR DISKUS) 100-50 MCG/DOSE AEPB Inhale 1 puff into the lungs twice daily.  NEEDS APPT FOR REFILLS   . HYDROMET 5-1.5 MG/5ML syrup Take 5 mLs by mouth 2 (two) times daily as needed for cough. Reported on 04/25/2016   . loratadine (CLARITIN) 10 MG tablet Take 10 mg by mouth daily.     Marland Kitchen losartan-hydrochlorothiazide (HYZAAR) 100-25 MG per tablet Take 1 tablet by mouth daily.   . pantoprazole (PROTONIX) 40 MG tablet TAKE 1 TABLET BY MOUTH TWICE DAILY 04/25/2016: Taking once daily  . simvastatin (ZOCOR) 10 MG tablet Take 10 mg by mouth daily.   . [DISCONTINUED] Vitamin D, Ergocalciferol, (DRISDOL) 50000 UNITS CAPS capsule Take 1 capsule (50,000 Units total) by mouth once a week.   . Vitamin D, Ergocalciferol, (DRISDOL) 50000 units CAPS capsule TAKE 1 CAPSULE BY MOUTH ONCE A WEEK (Patient  not taking: Reported on 02/23/2017)    No facility-administered encounter medications on file as of 02/23/2017.     ALLERGIES:  No Known Allergies   PHYSICAL EXAM:  ECOG Performance status: 1 - Symptomatic, but independent.   Vitals:   02/23/17 1240  BP: (!) 157/65  Pulse: 97  Resp: 18  Temp: 97.9 F (36.6 C)   Filed Weights   02/23/17 1240  Weight: 235 lb 12.8 oz (107 kg)    Physical Exam  Constitutional: She is oriented to person, place, and time and well-developed, well-nourished, and in no distress.  HENT:  Head: Normocephalic.  Mouth/Throat: Oropharynx is clear and moist. No oropharyngeal exudate.  Eyes: Conjunctivae are normal. Pupils are equal, round, and reactive to light. No scleral icterus.  Neck: Normal range of motion. Neck supple.  Cardiovascular: Normal rate, regular rhythm and normal heart sounds.   Pulmonary/Chest: Effort normal and breath sounds normal. No respiratory distress. Right breast exhibits no nipple discharge and no skin change. Left breast exhibits no nipple discharge and no skin change.    Abdominal: Soft. Bowel sounds are normal. There is no tenderness.  Musculoskeletal: Normal range of motion. She exhibits no edema.  Lymphadenopathy:    She has no cervical adenopathy.       Right: No supraclavicular adenopathy present.       Left: No supraclavicular adenopathy present.  Neurological: She is alert and oriented to person, place, and time. No cranial nerve deficit. Gait normal.  Skin: Skin is warm and dry. No rash noted.  Psychiatric: Mood, memory, affect and judgment normal.  Nursing note and vitals reviewed.    LABORATORY DATA:  I have reviewed the labs as listed.  CBC    Component Value Date/Time   WBC 5.0 01/31/2017 0916   RBC 4.21 01/31/2017 0916   HGB 13.7 01/31/2017 0916   HCT 40.1 01/31/2017 0916   PLT 239 01/31/2017 0916   MCV 95.2 01/31/2017 0916   MCH 32.5 01/31/2017 0916   MCHC 34.2 01/31/2017 0916   RDW 13.2 01/31/2017  0916  LYMPHSABS 1.5 01/31/2017 0916   MONOABS 0.5 01/31/2017 0916   EOSABS 0.2 01/31/2017 0916   BASOSABS 0.0 01/31/2017 0916   CMP Latest Ref Rng & Units 01/31/2017 11/23/2016 07/20/2016  Glucose 65 - 99 mg/dL 112(H) 73 83  BUN 6 - 20 mg/dL 18 17 15   Creatinine 0.44 - 1.00 mg/dL 0.76 0.72 0.69  Sodium 135 - 145 mmol/L 137 138 139  Potassium 3.5 - 5.1 mmol/L 4.1 4.3 4.2  Chloride 101 - 111 mmol/L 105 104 108  CO2 22 - 32 mmol/L 23 26 26   Calcium 8.9 - 10.3 mg/dL 9.4 9.2 9.3  Total Protein 6.5 - 8.1 g/dL 7.2 7.0 7.2  Total Bilirubin 0.3 - 1.2 mg/dL 0.8 0.6 0.7  Alkaline Phos 38 - 126 U/L 66 54 64  AST 15 - 41 U/L 25 23 25   ALT 14 - 54 U/L 21 19 21     PENDING LABS:    DIAGNOSTIC IMAGING:  DEXA scan: 06/08/15   Last mammogram: 02/14/17   PATHOLOGY:  Right breast lumpectomy surgical path: 03/18/15      ASSESSMENT & PLAN:   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  s/p lumpectomy with Dr. Arnoldo Morale. Oncotype DX score 4-low risk suggesting no benefit from adjuvant chemotherapy. Completed adjuvant radiation therapy with Dr. Pablo Ledger on 06/05/15. Started anti-estrogen therapy with Anastrozole in 05/2015.  -Continue Arimidex likely for 5-10 years. Could consider sending Breast Cancer Index (BCI) testing to predict benefit from extension of anti-estrogen therapy; we briefly discussed this testing today.  -Last mammogram 02/14/17 negative. Clinical breast exam benign today.  -Return to cancer center in 6 months for continued follow-up.   Arthralgias:  -Likely secondary to arthritis and aromatase inhibitor therapy.  -Tylenol OTC currently effective; continue as needed. Recommended she not exceed 3 gm Tylenol/day.   Bone health:  -Last DEXA scan done 06/08/15 showed osteopenia with T-score -1.4.   -Biennial DEXA imaging due in 05/2017; orders placed today.  -Continue Prolia injections every 6 months.  -She enjoys line dancing;  encouraged her to continue weight-bearing exercises.  -Continue increased diet with calcium-rich foods vs oral calcium supplement.   Vitamin D deficiency:  -Last serum vitamin D normal at 43.6 on 01/31/17.  Encouraged her to stop the prescription strength Vitamin D 50,000 IU weekly.  She can take vitamin D OTC daily (1000-2000 IU/day).    Sleep disturbance:  -Likely secondary to arthralgias.  -Encouraged her to continue taking Tylenol Arthritis OTC at bedtime; she could also try Benadryl 25-50 mg at bedtime concurrently to help her sleep.      Dispo:  -Biennial DEXA scan due in 05/2017; orders placed today.  -Return to cancer center in 6 months for continued surveillance.    All questions were answered to patient's stated satisfaction. Encouraged patient to call with any new concerns or questions before her next visit to the cancer center and we can certain see her sooner, if needed.      Orders placed this encounter:  Orders Placed This Encounter  Procedures  . DG Bone Density  . CBC with Differential/Platelet  . Comprehensive metabolic panel  . VITAMIN D 25 Hydroxy (Vit-D Deficiency, Fractures)      Mike Craze, NP [place] [phone]

## 2017-02-23 NOTE — Patient Instructions (Addendum)
[place] at [facility] Discharge Instructions  RECOMMENDATIONS MADE BY THE CONSULTANT AND ANY TEST RESULTS WILL BE SENT TO YOUR REFERRING PHYSICIAN.  You were seen today by Mike Craze NP. Get Bone Density done in June 2018. Stop taking Rx vitamin D. Start taking 1200-2000 units of vitamin D over the counter. Try taking Benadryl 25-50mg  at night to help with sleep, you may take tylenol with this as well. Return in 6 months for labs and a follow up visit.    Thank you for choosing [place] at [facility] to provide your oncology and hematology care.  To afford each patient quality time with our provider, please arrive at least 15 minutes before your scheduled appointment time.    If you have a lab appointment with the [facility] please come in thru the  Main Entrance and check in at the main information desk  You need to re-schedule your appointment should you arrive 10 or more minutes late.  We strive to give you quality time with our providers, and arriving late affects you and other patients whose appointments are after yours.  Also, if you no show three or more times for appointments you may be dismissed from the clinic at the providers discretion.     Again, thank you for choosing [facility].  Our hope is that these requests will decrease the amount of time that you wait before being seen by our physicians.       _____________________________________________________________  Should you have questions after your visit to [facility] Main, please contact our office at [phone] between the hours of 8:30 a.m. and 4:30 p.m.  Voicemails left after 4:30 p.m. will not be returned until the following business day.  For prescription refill requests, have your pharmacy contact our office.       Resources For Cancer Patients and their Caregivers ? American Cancer Society: Can assist with transportation, wigs,  general needs, runs Look Good Feel Better.        202-691-8333 ? Cancer Care: Provides financial assistance, online support groups, medication/co-pay assistance.  1-800-813-HOPE 914-837-2900) ? [place] Assists [place] cancer patients and their families through emotional , educational and financial support.  (870) 701-6932 ? [place] DSS Where to apply for food stamps, Medicaid and utility assistance. 706-724-7410 ? [facility]: Transportation to medical appointments. 913-499-3739 ? Social Security Administration: May apply for disability if have a Stage IV cancer. 867-369-4019 678-518-6858 ? [employer], Disability and Transit Services: Assists with nutrition, care and transit needs. [place] Support Programs: @10RELATIVEDAYS @ > Cancer Support Group  2nd Tuesday of the month 1pm-2pm, Journey Room  > Creative Journey  3rd Tuesday of the month 1130am-1pm, Journey Room  > Look Good Feel Better  1st Wednesday of the month 10am-12 noon, Journey Room (Call [facility] to register [phone])

## 2017-03-09 DIAGNOSIS — R319 Hematuria, unspecified: Secondary | ICD-10-CM | POA: Diagnosis not present

## 2017-03-10 DIAGNOSIS — G4733 Obstructive sleep apnea (adult) (pediatric): Secondary | ICD-10-CM | POA: Diagnosis not present

## 2017-03-13 ENCOUNTER — Other Ambulatory Visit (HOSPITAL_COMMUNITY): Payer: Self-pay | Admitting: Oncology

## 2017-03-13 DIAGNOSIS — C50411 Malignant neoplasm of upper-outer quadrant of right female breast: Secondary | ICD-10-CM

## 2017-03-28 ENCOUNTER — Telehealth: Payer: Self-pay | Admitting: Pulmonary Disease

## 2017-03-28 MED ORDER — FLUTICASONE-SALMETEROL 100-50 MCG/DOSE IN AEPB: INHALATION_SPRAY | RESPIRATORY_TRACT | 1 refills | Status: DC

## 2017-03-28 NOTE — Telephone Encounter (Signed)
Patient returned phone called, prefer an appointment with PM, but out of medication.  [phone].Crystal [name]

## 2017-03-28 NOTE — Telephone Encounter (Signed)
Spoke with the pt  I have scheduled her appt with PM for 05/17/17 and will refill advair to last until then  Rx sent and pt will keep appt for any future refills

## 2017-03-28 NOTE — Telephone Encounter (Signed)
Needs ov  LMTCB 

## 2017-05-05 DIAGNOSIS — L03032 Cellulitis of left toe: Secondary | ICD-10-CM | POA: Diagnosis not present

## 2017-05-07 ENCOUNTER — Encounter (HOSPITAL_COMMUNITY): Payer: Self-pay | Admitting: Emergency Medicine

## 2017-05-07 ENCOUNTER — Other Ambulatory Visit: Payer: Self-pay

## 2017-05-07 ENCOUNTER — Emergency Department (HOSPITAL_COMMUNITY)
Admission: EM | Admit: 2017-05-07 | Discharge: 2017-05-08 | Disposition: A | Discharge status: Home / Self Care | Payer: Medicare HMO | Attending: Emergency Medicine | Admitting: Emergency Medicine

## 2017-05-07 DIAGNOSIS — R630 Anorexia: Secondary | ICD-10-CM | POA: Diagnosis not present

## 2017-05-07 DIAGNOSIS — Z79899 Other long term (current) drug therapy: Secondary | ICD-10-CM | POA: Insufficient documentation

## 2017-05-07 DIAGNOSIS — Z853 Personal history of malignant neoplasm of breast: Secondary | ICD-10-CM | POA: Diagnosis not present

## 2017-05-07 DIAGNOSIS — R110 Nausea: Secondary | ICD-10-CM | POA: Diagnosis not present

## 2017-05-07 DIAGNOSIS — I10 Essential (primary) hypertension: Secondary | ICD-10-CM | POA: Diagnosis not present

## 2017-05-07 DIAGNOSIS — Z7982 Long term (current) use of aspirin: Secondary | ICD-10-CM | POA: Insufficient documentation

## 2017-05-07 DIAGNOSIS — R9431 Abnormal electrocardiogram [ECG] [EKG]: Secondary | ICD-10-CM | POA: Diagnosis not present

## 2017-05-07 LAB — COMPREHENSIVE METABOLIC PANEL
ALBUMIN: 4.2 g/dL (ref 3.5–5.0)
ALT: 23 U/L (ref 14–54)
AST: 28 U/L (ref 15–41)
Alkaline Phosphatase: 54 U/L (ref 38–126)
Anion gap: 11 (ref 5–15)
BILIRUBIN TOTAL: 0.6 mg/dL (ref 0.3–1.2)
BUN: 15 mg/dL (ref 6–20)
CO2: 23 mmol/L (ref 22–32)
CREATININE: 0.74 mg/dL (ref 0.44–1.00)
Calcium: 9.1 mg/dL (ref 8.9–10.3)
Chloride: 107 mmol/L (ref 101–111)
GFR calc Af Amer: >60 mL/min (ref >60)
GLUCOSE: 115 mg/dL — AB (ref 65–99)
Potassium: 3.7 mmol/L (ref 3.5–5.1)
Sodium: 141 mmol/L (ref 135–145)
TOTAL PROTEIN: 7.1 g/dL (ref 6.5–8.1)

## 2017-05-07 LAB — CBC
HEMATOCRIT: 37.1 % (ref 36.0–46.0)
Hemoglobin: 12.7 g/dL (ref 12.0–15.0)
MCH: 32.6 pg (ref 26.0–34.0)
MCHC: 34.2 g/dL (ref 30.0–36.0)
MCV: 95.4 fL (ref 78.0–100.0)
PLATELETS: 204 10*3/uL (ref 150–400)
RBC: 3.89 MIL/uL (ref 3.87–5.11)
RDW: 13.5 % (ref 11.5–15.5)
WBC: 5.3 10*3/uL (ref 4.0–10.5)

## 2017-05-07 LAB — LIPASE, BLOOD: Lipase: 14 U/L (ref 11–51)

## 2017-05-07 MED ORDER — ONDANSETRON 4 MG PO TBDP
4.0000 mg | ORAL_TABLET | Freq: Once | ORAL | Status: AC
  Administered 2017-05-07: 4 mg via ORAL
  Filled 2017-05-07: qty 1

## 2017-05-07 NOTE — ED Triage Notes (Signed)
Nausea since last night that has gotten worse today.  No vomiting.  Denies any pain.

## 2017-05-07 NOTE — ED Provider Notes (Signed)
[facility] DEPT Provider Note   CSN: [id number] Arrival date & time: 05/07/17  2214  By signing my name below, I, Eunice Blase, attest that this documentation has been prepared under the direction and in the presence of Maebelle Sulton, Barbette Hair, MD. Electronically signed, Eunice Blase, ED Scribe. 05/08/17. 1:06 AM.    History   Chief Complaint Chief Complaint  Patient presents with  . Nausea   The history is provided by the patient and medical records. No language interpreter was used.    Crystal [name] is a 76 y.o. female h/o diverticulitis who presents to the Emergency Department with concern for nausea onset last night after eating pizza. Decreased appetite noted today. She states she ate chicken noodle soup today and this did not exacerbate her nausea. Pt has taken no medications for this at home. 4-5 NL BM's reported yesterday, none today. Multiple chronic conditions noted including HTN, HLD that are well controlled as of her physical with her PCP on 04/25/2017. Pt placed on undisclosed abx 2 days ago for bilateral great toe swelling. No vomiting, abdomen or chest pain, SOB or fever. No other complaints at this time.   Past Medical History:  Diagnosis Date  . Breast cancer ([place]) 03/21/15   right  . Chronic fatigue   . 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 Depression   . Diverticulosis   . Enlarged thyroid   . GERD (gastroesophageal reflux disease)   . HTN (hypertension)   . OA (osteoarthritis)   . Obesity   . PONV (postoperative nausea and vomiting)   . Sleep apnea    cpap  . Spinal stenosis   . Tubular adenoma of colon 08/2007    Patient Active Problem List   Diagnosis Date Noted  . Osteopenia determined by x-ray 07/23/2015  . Breast cancer ([place]) 03/31/2015  . Colitis, ischemic ([place]) 06/27/2011  . Tubular adenoma 06/27/2011    Past Surgical History:  Procedure Laterality Date  . ABDOMINAL  HYSTERECTOMY     partial  . BREAST CYST EXCISION     benign, left x2, right x1  . CATARACT EXTRACTION W/PHACO  12/01/2011   Procedure: CATARACT EXTRACTION PHACO AND INTRAOCULAR LENS PLACEMENT (IOC);  Surgeon: Tonny Branch;  Location: [facility];  Service: Ophthalmology;  Laterality: Right;  CDE=14.87  . CATARACT EXTRACTION W/PHACO  02/06/2012   Procedure: CATARACT EXTRACTION PHACO AND INTRAOCULAR LENS PLACEMENT (IOC);  Surgeon: Tonny Branch, MD;  Location: [facility];  Service: Ophthalmology;  Laterality: Left;  CDE 13.00  . COLONOSCOPY  Sept 2008   Last colonoscopy/bx by Dr Rourk->(08/31/07) descending colon TUBULAR ADENOMA  . COLONOSCOPY  10/25/2012   Procedure: COLONOSCOPY;  Surgeon: Daneil Dolin, MD;  Location: [facility] END[facility] SUITE;  Service: Endoscopy;  Laterality: N/A;  10:15-changed to 10:30 Darius Bump notified  . PARTIAL MASTECTOMY WITH NEEDLE LOCALIZATION AND AXILLARY SENTINEL LYMPH NODE BX Right 03/18/2015   Procedure: PARTIAL MASTECTOMY AFTER NEEDLE LOCALIZATION AND AXILLARY SENTINEL LYMPH NODE BX;  Surgeon: Aviva Signs Md, MD;  Location: [facility];  Service: General;  Laterality: Right;  . TUBAL LIGATION  1970   hysterectomy    OB History    No data available       Home Medications    Prior to Admission medications   Medication Sig Start Date End Date Taking? Authorizing Provider  acetaminophen (TYLENOL ARTHRITIS PAIN) 650 MG CR tablet Take 1,300 mg by mouth 2 (two) times daily.  [provider]  amLODipine (NORVASC) 10 MG tablet Take 10 mg by mouth every evening.  10/02/12   [provider]  anastrozole (ARIMIDEX) 1 MG tablet TAKE 1 TABLET EVERY DAY 03/13/17   Baird Cancer, PA-C  Artificial Tear Solution (SOOTHE XP OP) Place 1 drop into both eyes 2 (two) times daily.    [provider]  aspirin EC 81 MG tablet Take 81 mg by mouth daily.      [provider]  buPROPion (WELLBUTRIN SR) 150 MG 12 hr tablet Take 150 mg by mouth 2 (two) times daily.  06/02/11    [provider]  Calcium-Vitamin D (CALTRATE 600 PLUS-VIT D PO) Take 1 tablet by mouth daily.    [provider]  FLUoxetine (PROZAC) 20 MG capsule Take 60 mg by mouth daily.  06/23/11   [provider]  fluticasone (FLONASE) 50 MCG/ACT nasal spray Place 2 sprays into both nostrils daily.     [provider]  Fluticasone-Salmeterol (ADVAIR DISKUS) 100-50 MCG/DOSE AEPB Inhale 1 puff into the lungs twice daily.  NEEDS APPT FOR REFILLS Patient taking differently: Inhale 1 puff into the lungs 2 (two) times daily. Inhale 1 puff into the lungs twice daily.  NEEDS APPT FOR REFILLS 03/28/17   Mannam, Hart Robinsons, MD  loratadine (CLARITIN) 10 MG tablet Take 10 mg by mouth daily.      [provider]  losartan-hydrochlorothiazide (HYZAAR) 100-25 MG per tablet Take 1 tablet by mouth daily.    [provider]  ondansetron (ZOFRAN ODT) 4 MG disintegrating tablet Take 1 tablet (4 mg total) by mouth every 8 (eight) hours as needed for nausea or vomiting. 05/08/17   Analuisa Tudor, Barbette Hair, MD  pantoprazole (PROTONIX) 40 MG tablet TAKE 1 TABLET BY MOUTH TWICE DAILY Patient taking differently: TAKE 1 TABLET BY MOUTH DAILY 30 MINUTES BEFORE BREAKFAST 12/14/15   Mannam, Praveen, MD  simvastatin (ZOCOR) 10 MG tablet Take 10 mg by mouth at bedtime.     [provider]  sulfamethoxazole-trimethoprim (BACTRIM DS,SEPTRA DS) 800-160 MG tablet TK 1 T PO  BID 05/05/17   [provider]  Vitamin D, Ergocalciferol, (DRISDOL) 50000 units CAPS capsule TAKE 1 CAPSULE BY MOUTH ONCE A WEEK Patient not taking: Reported on 02/23/2017 10/30/16   Penland, Kelby Fam, MD    Family History Family History  Problem Relation Age of Onset  . Cancer Brother 43  . Coronary artery disease Brother   . Diabetes Sister   . Heart disease Father   . Heart disease Brother   . Anesthesia problems Neg Hx   . Hypotension Neg Hx   . Pseudochol deficiency Neg Hx   . Malignant hyperthermia Neg  Hx   . Colon cancer Neg Hx     Social History Social History  Substance Use Topics  . Smoking status: Never Smoker  . Smokeless tobacco: Never Used  . Alcohol use No     Allergies   Patient has no known allergies.   Review of Systems Review of Systems  Constitutional: Positive for appetite change. Negative for fever.  Respiratory: Negative for shortness of breath.   Cardiovascular: Negative for chest pain.  Gastrointestinal: Positive for nausea. Negative for abdominal pain and vomiting.  All other systems reviewed and are negative.    Physical Exam Updated Vital Signs BP (!) 151/77   Pulse 92   Temp 98.3 F (36.8 C)   Resp 20   Ht 5\' 3"  (1.6 m)   Wt 230  lb (104.3 kg)   SpO2 99%   BMI 40.74 kg/m   Physical Exam  Constitutional: She is oriented to person, place, and time. She appears well-developed and well-nourished.  Elderly, no acute distress  HENT:  Head: Normocephalic and atraumatic.  Mucous membranes moist  Cardiovascular: Normal rate, regular rhythm and normal heart sounds.   Pulmonary/Chest: Effort normal and breath sounds normal. No respiratory distress. She has no wheezes.  Abdominal: Soft. Bowel sounds are normal. She exhibits no distension. There is no tenderness. There is no guarding.  Musculoskeletal:  Bilateral bunions with mild erythema, there is mild erythema at the left great toe, no fluctuance  Neurological: She is alert and oriented to person, place, and time.  Skin: Skin is warm and dry.  Psychiatric: She has a normal mood and affect.  Nursing note and vitals reviewed.    ED Treatments / Results  DIAGNOSTIC STUDIES: Oxygen Saturation is 99% on RA, NL by my interpretation.    COORDINATION OF CARE: 11:31 PM-Discussed next steps with pt. Pt verbalized understanding and is agreeable with the plan.   Labs (all labs ordered are listed, but only abnormal results are displayed) Labs Reviewed  COMPREHENSIVE METABOLIC PANEL - Abnormal;  Notable for the following:       Result Value   Glucose, Bld 115 (*)    All other components within normal limits  LIPASE, BLOOD  CBC  URINALYSIS, ROUTINE W REFLEX MICROSCOPIC    EKG  EKG Interpretation  Date/Time:  Sunday May 07 2017 23:51:28 EDT Ventricular Rate:  83 PR Interval:    QRS Duration: 103 QT Interval:  406 QTC Calculation: 478 R Axis:   0 Text Interpretation:  Sinus rhythm Probable lateral infarct, old Confirmed by Thayer Jew [phone]) on 05/08/2017 1:33:13 AM       Radiology No results found.  Procedures Procedures (including critical care time)  Medications Ordered in ED Medications  ondansetron (ZOFRAN-ODT) disintegrating tablet 4 mg (4 mg Oral Given 05/07/17 2355)     Initial Impression / Assessment and Plan / ED Course  I have reviewed the triage vital signs and the nursing notes.  Pertinent labs & imaging results that were available during my care of the patient were reviewed by me and considered in my medical decision making (see chart for details).     Patient presents with isolated nausea. She reports no vomiting. She has been able to tolerate fluids and food. Denies any abdominal pain. Abdominal exam is benign. Screening EKG shows no evidence of ischemia. Basic labwork is reassuring. She was recently started on antibiotic which could be contributing. Given her benign exam, do not feel she needs imaging at this time. Patient was given nausea medication and able to tolerate fluids without difficulty. On recheck she states that she feels much better with medication. Will discharge home with Zofran. Follow-up with primary physician recommended.  After history, exam, and medical workup I feel the patient has been appropriately medically screened and is safe for discharge home. Pertinent diagnoses were discussed with the patient. Patient was given return precautions.   Final Clinical Impressions(s) / ED Diagnoses   Final diagnoses:  Nausea     New Prescriptions Discharge Medication List as of 05/08/2017  1:34 AM    START taking these medications   Details  ondansetron (ZOFRAN ODT) 4 MG disintegrating tablet Take 1 tablet (4 mg total) by mouth every 8 (eight) hours as needed for nausea or vomiting., Starting Mon 05/08/2017, Print  I personally performed the services described in this documentation, which was scribed in my presence. The recorded information has been reviewed and is accurate.    Merryl Hacker, MD 05/08/17 [phone]

## 2017-05-08 MED ORDER — ONDANSETRON 4 MG PO TBDP: 4.0000 mg | ORAL_TABLET | Freq: Three times a day (TID) | ORAL | 0 refills | Status: DC | PRN

## 2017-05-08 NOTE — ED Notes (Signed)
Pt given water to drink. 

## 2017-05-08 NOTE — Pre-Procedure Instructions (Signed)
MYLYNN DINH  05/08/2017      [place], [place] [address] Phone: [phone] Fax: [phone]  [id number] - [place], [place] [address] Phone: [phone] Fax: [phone]  [facility] [place], [place] - [address] AT [place] [place] [address] Phone: [phone] Fax: [phone]  [facility] - [place], [place] [address] Phone: [phone] Fax: [phone]    Your procedure is scheduled on May 11, 2017.  Report to [facility] Admitting at 11:30 A.M.  Call this number if you have problems the morning of surgery:  [phone]   Remember:  Do not eat food or drink liquids after midnight.  Take these medicines the morning of surgery with A SIP OF WATER: acetaminophen (Tylenol arthritis) if needed, Amlodipine (Norvasc), Anastrozole (Arimidex), Eye drops if needed, Bupropion (Wellbutrin), Fluoxetine (Prozac), Flonase nasal spray if needed, Advair Diskus inhaler if needed--bring with you, Loratadine (Claritin), Ondansetron (Zofran) if needed, Pantoprazole (Protonix), Sulfamethoxazole-trimethoprim (Bactrim)  7 days prior to surgery STOP taking any Aspirin, Aleve, Naproxen, Ibuprofen, Motrin, Advil, Goody's, BC's, all herbal medications, fish oil, and all vitamins   Do not wear jewelry, make-up or nail polish.  Do not wear lotions, powders, or perfumes, or deoderant.  Do not shave 48 hours prior to surgery.   Do not bring valuables to the hospital.    [facility] is not responsible for any belongings or valuables.  Contacts, dentures or bridgework may not be worn into surgery.  Leave your suitcase in the car.  After surgery it may be brought to your room.  For patients admitted to the hospital, discharge time will be determined by  your treatment team.  Patients discharged the day of surgery will not be allowed to drive home.    Special instructions:   [facility]- Preparing For Surgery  Before surgery, you can play an important role. Because skin is not sterile, your skin needs to be as free of germs as possible. You can reduce the number of germs on your skin by washing with CHG (chlorahexidine gluconate) Soap before surgery.  CHG is an antiseptic cleaner which kills germs and bonds with the skin to continue killing germs even after washing.  Please do not use if you have an allergy to CHG or antibacterial soaps. If your skin becomes reddened/irritated stop using the CHG.  Do not shave (including legs and underarms) for at least 48 hours prior to first CHG shower. It is OK to shave your face.  Please follow these instructions carefully.   1. Shower the NIGHT BEFORE SURGERY and the MORNING OF SURGERY with CHG.   2. If you chose to wash your hair, wash your hair first as usual with your normal shampoo.  3. After you shampoo, rinse your hair and body thoroughly to remove the shampoo.  4. Use CHG as you would any other liquid soap. You can apply CHG directly to the skin and wash gently with a scrungie or a clean washcloth.   5. Apply the CHG Soap to your body ONLY FROM THE NECK DOWN.  Do not use on open wounds or open sores. Avoid contact with your eyes, ears, mouth and genitals (private parts). Wash genitals (private parts) with your normal soap.  6. Wash thoroughly, paying special attention to the area where your surgery will be performed.  7. Thoroughly rinse your body with warm water from the neck down.  8. DO NOT shower/wash with your normal soap after using and rinsing off the CHG Soap.  9. Pat yourself dry with a CLEAN TOWEL.   10. Wear CLEAN PAJAMAS   11. Place CLEAN SHEETS on your bed the night of your first shower and DO NOT SLEEP WITH PETS.    Day of Surgery: Do not apply any deodorants/lotions.  Please wear clean clothes to the hospital/surgery center.      Please read over the following fact sheets that you were given. Pain Booklet and Surgical Site Infection Prevention

## 2017-05-08 NOTE — Progress Notes (Signed)
Called Dr. Tomasita Crumble office and spoke with Stanton Kidney.  Requested MD orders prior to pt's PAT appt tomorrow, 05/09/17.

## 2017-05-08 NOTE — Discharge Instructions (Signed)
You were seen today for nausea. Your workup is largely reassuring. Given that you do not have any other symptoms, this may be related to your recent antibiotic. Follow up to primary physician. You'll be given Zofran as needed for nausea. If you develop any new or worsening symptoms including abdominal pain difficulty with her bowel or bladder or any new or worsening symptoms she'll eat to be reevaluated.

## 2017-05-09 ENCOUNTER — Encounter (HOSPITAL_COMMUNITY): Payer: Self-pay | Admitting: Cardiology

## 2017-05-09 ENCOUNTER — Inpatient Hospital Stay (HOSPITAL_COMMUNITY)
Admission: RE | Admit: 2017-05-09 | Discharge: 2017-05-09 | Disposition: A | Discharge status: Home / Self Care | Payer: Medicare HMO | Source: Ambulatory Visit

## 2017-05-09 ENCOUNTER — Emergency Department (HOSPITAL_COMMUNITY)
Admission: EM | Admit: 2017-05-09 | Discharge: 2017-05-09 | Disposition: A | Discharge status: Home / Self Care | Payer: Medicare HMO | Attending: Emergency Medicine | Admitting: Emergency Medicine

## 2017-05-09 DIAGNOSIS — Z853 Personal history of malignant neoplasm of breast: Secondary | ICD-10-CM | POA: Insufficient documentation

## 2017-05-09 DIAGNOSIS — R110 Nausea: Secondary | ICD-10-CM | POA: Diagnosis not present

## 2017-05-09 DIAGNOSIS — Z7982 Long term (current) use of aspirin: Secondary | ICD-10-CM | POA: Diagnosis not present

## 2017-05-09 DIAGNOSIS — I10 Essential (primary) hypertension: Secondary | ICD-10-CM | POA: Diagnosis not present

## 2017-05-09 DIAGNOSIS — T368X5D Adverse effect of other systemic antibiotics, subsequent encounter: Secondary | ICD-10-CM | POA: Diagnosis not present

## 2017-05-09 DIAGNOSIS — Z79899 Other long term (current) drug therapy: Secondary | ICD-10-CM | POA: Diagnosis not present

## 2017-05-09 DIAGNOSIS — R112 Nausea with vomiting, unspecified: Secondary | ICD-10-CM | POA: Diagnosis not present

## 2017-05-09 DIAGNOSIS — T50905D Adverse effect of unspecified drugs, medicaments and biological substances, subsequent encounter: Secondary | ICD-10-CM

## 2017-05-09 DIAGNOSIS — T887XXA Unspecified adverse effect of drug or medicament, initial encounter: Secondary | ICD-10-CM | POA: Diagnosis not present

## 2017-05-09 LAB — CBC WITH DIFFERENTIAL/PLATELET
Basophils Absolute: 0 10*3/uL (ref 0.0–0.1)
Basophils Relative: 0 %
Eosinophils Absolute: 0.2 10*3/uL (ref 0.0–0.7)
Eosinophils Relative: 4 %
HEMATOCRIT: 38.8 % (ref 36.0–46.0)
HEMOGLOBIN: 13.3 g/dL (ref 12.0–15.0)
LYMPHS ABS: 0.8 10*3/uL (ref 0.7–4.0)
LYMPHS PCT: 16 %
MCH: 32.5 pg (ref 26.0–34.0)
MCHC: 34.3 g/dL (ref 30.0–36.0)
MCV: 94.9 fL (ref 78.0–100.0)
MONOS PCT: 11 %
Monocytes Absolute: 0.6 10*3/uL (ref 0.1–1.0)
NEUTROS ABS: 3.4 10*3/uL (ref 1.7–7.7)
NEUTROS PCT: 69 %
Platelets: 224 10*3/uL (ref 150–400)
RBC: 4.09 MIL/uL (ref 3.87–5.11)
RDW: 13.5 % (ref 11.5–15.5)
WBC: 5 10*3/uL (ref 4.0–10.5)

## 2017-05-09 LAB — COMPREHENSIVE METABOLIC PANEL
ALBUMIN: 4.2 g/dL (ref 3.5–5.0)
ALK PHOS: 55 U/L (ref 38–126)
ALT: 30 U/L (ref 14–54)
ANION GAP: 9 (ref 5–15)
AST: 39 U/L (ref 15–41)
BILIRUBIN TOTAL: 0.6 mg/dL (ref 0.3–1.2)
BUN: 10 mg/dL (ref 6–20)
CALCIUM: 9 mg/dL (ref 8.9–10.3)
CO2: 25 mmol/L (ref 22–32)
Chloride: 102 mmol/L (ref 101–111)
Creatinine, Ser: 0.78 mg/dL (ref 0.44–1.00)
GFR calc Af Amer: >60 mL/min (ref >60)
GLUCOSE: 95 mg/dL (ref 65–99)
POTASSIUM: 3.6 mmol/L (ref 3.5–5.1)
Sodium: 136 mmol/L (ref 135–145)
Total Protein: 7.3 g/dL (ref 6.5–8.1)

## 2017-05-09 LAB — URINALYSIS, ROUTINE W REFLEX MICROSCOPIC
BACTERIA UA: NONE SEEN
Bilirubin Urine: NEGATIVE
Glucose, UA: NEGATIVE mg/dL
Ketones, ur: NEGATIVE mg/dL
NITRITE: NEGATIVE
PROTEIN: NEGATIVE mg/dL
SPECIFIC GRAVITY, URINE: 1.012 (ref 1.005–1.030)
pH: 6 (ref 5.0–8.0)

## 2017-05-09 LAB — LIPASE, BLOOD: LIPASE: 14 U/L (ref 11–51)

## 2017-05-09 MED ORDER — SODIUM CHLORIDE 0.9 % IV BOLUS (SEPSIS)
1000.0000 mL | Freq: Once | INTRAVENOUS | Status: AC
  Administered 2017-05-09: 1000 mL via INTRAVENOUS

## 2017-05-09 MED ORDER — PROMETHAZINE HCL 25 MG/ML IJ SOLN
12.5000 mg | Freq: Once | INTRAMUSCULAR | Status: AC
  Administered 2017-05-09: 12.5 mg via INTRAVENOUS
  Filled 2017-05-09: qty 1

## 2017-05-09 MED ORDER — CEPHALEXIN 500 MG PO CAPS: 500.0000 mg | ORAL_CAPSULE | Freq: Four times a day (QID) | ORAL | 0 refills | Status: DC

## 2017-05-09 MED ORDER — PROMETHAZINE HCL 25 MG PO TABS: 12.5000 mg | ORAL_TABLET | Freq: Four times a day (QID) | ORAL | 0 refills | Status: DC | PRN

## 2017-05-09 NOTE — Discharge Instructions (Signed)
We suspect you are having a bad side effect of the bactrim, so we have switched you to keflex for your toe infection.  Stop taking bactrim.  You may take phenergan if needed for continued nausea, but hope this will subside since you will not be taking this antibiotic.  Phenergan can make you drowsy so use caution with this medicine.

## 2017-05-09 NOTE — ED Triage Notes (Signed)
Nausea since Sunday.  Seen here with same Sunday.  C/o weakness since yesterday.

## 2017-05-09 NOTE — ED Triage Notes (Signed)
Pt started antibiodic Friday for toe.

## 2017-05-09 NOTE — ED Provider Notes (Signed)
[facility] DEPT Provider Note   CSN: [id number] Arrival date & time: 05/09/17  5366     History   Chief Complaint Chief Complaint  Patient presents with  . Nausea    HPI Crystal [name] is a 76 y.o. female who is currently being treated with bactrim for an infected left great toe ingrown toenail with bactrim which she started 5 days ago.  Within 24 hours she had developed worsening nausea without emesis, abdominal pain, diarrhea or other bowel changes and no dysuria or fevers.  She was seen here 2 days ago and was treated with zofran which helped initially but is no longer controlling her nausea.  She has had no solid food intake in 24 hours, has tried to maintain hydration but states this has been difficult too.  She is also soaking the toe in epsom salt soaks and it is feeling improved. She has a history of diverticulitis, but denies abdominal pain.  Also denies cp, sob, cough.  She does endorse weakness and feels she is dehydrated.   HPI  Past Medical History:  Diagnosis Date  . Breast cancer ([place]) 03/21/15   right  . Chronic fatigue   . 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 Depression   . Diverticulosis   . Enlarged thyroid   . GERD (gastroesophageal reflux disease)   . HTN (hypertension)   . OA (osteoarthritis)   . Obesity   . PONV (postoperative nausea and vomiting)   . Sleep apnea    cpap  . Spinal stenosis   . Tubular adenoma of colon 08/2007    Patient Active Problem List   Diagnosis Date Noted  . Osteopenia determined by x-ray 07/23/2015  . Breast cancer ([place]) 03/31/2015  . Colitis, ischemic ([place]) 06/27/2011  . Tubular adenoma 06/27/2011    Past Surgical History:  Procedure Laterality Date  . ABDOMINAL HYSTERECTOMY     partial  . BREAST CYST EXCISION     benign, left x2, right x1  . CATARACT EXTRACTION W/PHACO  12/01/2011   Procedure: CATARACT EXTRACTION PHACO AND INTRAOCULAR LENS  PLACEMENT (IOC);  Surgeon: Tonny Branch;  Location: [facility] ORS;  Service: Ophthalmology;  Laterality: Right;  CDE=14.87  . CATARACT EXTRACTION W/PHACO  02/06/2012   Procedure: CATARACT EXTRACTION PHACO AND INTRAOCULAR LENS PLACEMENT (IOC);  Surgeon: Tonny Branch, MD;  Location: [facility] ORS;  Service: Ophthalmology;  Laterality: Left;  CDE 13.00  . COLONOSCOPY  Sept 2008   Last colonoscopy/bx by Dr Rourk->(08/31/07) descending colon TUBULAR ADENOMA  . COLONOSCOPY  10/25/2012   Procedure: COLONOSCOPY;  Surgeon: Daneil Dolin, MD;  Location: [facility] ENDO SUITE;  Service: Endoscopy;  Laterality: N/A;  10:15-changed to 10:30 Darius Bump notified  . PARTIAL MASTECTOMY WITH NEEDLE LOCALIZATION AND AXILLARY SENTINEL LYMPH NODE BX Right 03/18/2015   Procedure: PARTIAL MASTECTOMY AFTER NEEDLE LOCALIZATION AND AXILLARY SENTINEL LYMPH NODE BX;  Surgeon: Aviva Signs Md, MD;  Location: [facility] ORS;  Service: General;  Laterality: Right;  . TUBAL LIGATION  1970   hysterectomy    OB History    No data available       Home Medications    Prior to Admission medications   Medication Sig Start Date End Date Taking? Authorizing Provider  acetaminophen (TYLENOL ARTHRITIS PAIN) 650 MG CR tablet Take 1,300 mg by mouth 2 (two) times daily.    Yes [provider]  amLODipine (NORVASC) 10 MG tablet Take 10 mg by mouth every evening.  10/02/12  Yes [provider]  anastrozole (ARIMIDEX) 1 MG tablet TAKE 1 TABLET EVERY DAY 03/13/17  Yes Kefalas, Manon Hilding, PA-C  Artificial Tear Solution (SOOTHE XP OP) Place 1 drop into both eyes 2 (two) times daily.   Yes [provider]  aspirin EC 81 MG tablet Take 81 mg by mouth daily.     Yes [provider]  buPROPion (WELLBUTRIN SR) 150 MG 12 hr tablet Take 150 mg by mouth 2 (two) times daily.  06/02/11  Yes [provider]  Calcium-Vitamin D (CALTRATE 600 PLUS-VIT D PO) Take 1 tablet by mouth daily.   Yes [provider]  diphenhydrAMINE (BENADRYL) 25  MG tablet Take 50 mg by mouth at bedtime as needed for sleep.   Yes [provider]  FLUoxetine (PROZAC) 20 MG capsule Take 60 mg by mouth daily.  06/23/11  Yes [provider]  fluticasone (FLONASE) 50 MCG/ACT nasal spray Place 2 sprays into both nostrils daily.    Yes [provider]  Fluticasone-Salmeterol (ADVAIR DISKUS) 100-50 MCG/DOSE AEPB Inhale 1 puff into the lungs twice daily.  NEEDS APPT FOR REFILLS Patient taking differently: Inhale 1 puff into the lungs 2 (two) times daily. Inhale 1 puff into the lungs twice daily.  NEEDS APPT FOR REFILLS 03/28/17  Yes Mannam, Praveen, MD  loratadine (CLARITIN) 10 MG tablet Take 10 mg by mouth daily.     Yes [provider]  losartan-hydrochlorothiazide (HYZAAR) 100-25 MG per tablet Take 1 tablet by mouth daily.   Yes [provider]  ondansetron (ZOFRAN ODT) 4 MG disintegrating tablet Take 1 tablet (4 mg total) by mouth every 8 (eight) hours as needed for nausea or vomiting. 05/08/17  Yes Horton, Barbette Hair, MD  pantoprazole (PROTONIX) 40 MG tablet TAKE 1 TABLET BY MOUTH TWICE DAILY Patient taking differently: TAKE 1 TABLET BY MOUTH DAILY 30 MINUTES BEFORE BREAKFAST 12/14/15  Yes Mannam, Praveen, MD  simvastatin (ZOCOR) 10 MG tablet Take 10 mg by mouth at bedtime.    Yes [provider]  cephALEXin (KEFLEX) 500 MG capsule Take 1 capsule (500 mg total) by mouth 4 (four) times daily. 05/09/17   Evalee Jefferson, PA-C  promethazine (PHENERGAN) 25 MG tablet Take 0.5-1 tablets (12.5-25 mg total) by mouth every 6 (six) hours as needed for nausea or vomiting. 05/09/17   Shaima Sardinas, Almyra Free, PA-C  Vitamin D, Ergocalciferol, (DRISDOL) 50000 units CAPS capsule TAKE 1 CAPSULE BY MOUTH ONCE A WEEK Patient not taking: Reported on 02/23/2017 10/30/16   Penland, Kelby Fam, MD    Family History Family History  Problem Relation Age of Onset  . Cancer Brother 79  . Coronary artery disease Brother   . Diabetes Sister   . Heart  disease Father   . Heart disease Brother   . Anesthesia problems Neg Hx   . Hypotension Neg Hx   . Pseudochol deficiency Neg Hx   . Malignant hyperthermia Neg Hx   . Colon cancer Neg Hx     Social History Social History  Substance Use Topics  . Smoking status: Never Smoker  . Smokeless tobacco: Never Used  . Alcohol use No     Allergies   Patient has no known allergies.   Review of Systems Review of Systems  Constitutional: Negative for fever.  HENT: Negative for congestion and sore throat.   Eyes: Negative.   Respiratory: Negative for chest tightness and shortness of breath.   Cardiovascular: Negative for chest pain and palpitations.  Gastrointestinal:  Positive for nausea. Negative for abdominal pain, constipation, diarrhea and vomiting.  Genitourinary: Negative.   Musculoskeletal: Negative for arthralgias, joint swelling and neck pain.  Skin: Positive for color change. Negative for rash and wound.       Left great toe per hpi  Neurological: Negative for dizziness, weakness, light-headedness, numbness and headaches.  Psychiatric/Behavioral: Negative.      Physical Exam Updated Vital Signs BP 138/60 (BP Location: Left Arm)   Pulse 78   Temp 98.4 F (36.9 C) (Oral)   Resp 16   Ht 5\' 3"  (1.6 m)   Wt 99.8 kg   SpO2 96%   BMI 38.97 kg/m   Physical Exam  Constitutional: She appears well-developed and well-nourished.  HENT:  Head: Normocephalic and atraumatic.  Mouth/Throat: Mucous membranes are dry.  Eyes: Conjunctivae are normal.  Neck: Normal range of motion.  Cardiovascular: Normal rate, regular rhythm, normal heart sounds and intact distal pulses.   Pulmonary/Chest: Effort normal and breath sounds normal. She has no wheezes.  Abdominal: Soft. Bowel sounds are normal. She exhibits no mass. There is no tenderness. There is no rebound and no guarding.  Musculoskeletal: Normal range of motion.  Neurological: She is alert.  Skin: Skin is warm and dry.    Mildly tender left great toe.  Erythema around the cuticle edge.  No drainage, no red streaking.  Psychiatric: She has a normal mood and affect.  Nursing note and vitals reviewed.    ED Treatments / Results  Labs (all labs ordered are listed, but only abnormal results are displayed) Labs Reviewed  URINALYSIS, ROUTINE W REFLEX MICROSCOPIC - Abnormal; Notable for the following:       Result Value   Hgb urine dipstick SMALL (*)    Leukocytes, UA LARGE (*)    Squamous Epithelial / LPF 0-5 (*)    All other components within normal limits  URINE CULTURE  CBC WITH DIFFERENTIAL/PLATELET  COMPREHENSIVE METABOLIC PANEL  LIPASE, BLOOD    EKG  EKG Interpretation None       Radiology No results found.  Procedures Procedures (including critical care time)  Medications Ordered in ED Medications  sodium chloride 0.9 % bolus 1,000 mL (0 mLs Intravenous Stopped 05/09/17 1113)  promethazine (PHENERGAN) injection 12.5 mg (12.5 mg Intravenous Given 05/09/17 1010)     Initial Impression / Assessment and Plan / ED Course  I have reviewed the triage vital signs and the nursing notes.  Pertinent labs & imaging results that were available during my care of the patient were reviewed by me and considered in my medical decision making (see chart for details).     Labs reviewed from Sunday. Will repeat given possible hepatotoxicity with bactrim.  Suspect this is simply bactrim intolerance given no abd pain sx or other GI sx.  May need to switch abx to keflex or doxycycline to complete abx course.  Prn f/u anticipated.  Labs stable.  She was given IV fluids, phenergan, felt improved.  Switch to keflex in place of bactrim, continued soaks. F/u with pcp as planned.  Pt was seen by Dr. Rogene Houston during this encounter. Final Clinical Impressions(s) / ED Diagnoses   Final diagnoses:  Nausea without vomiting  Medication side effect, subsequent encounter    New Prescriptions Discharge  Medication List as of 05/09/2017 11:51 AM    START taking these medications   Details  cephALEXin (KEFLEX) 500 MG capsule Take 1 capsule (500 mg total) by mouth 4 (four) times daily., Starting Tue 05/09/2017,  Print    promethazine (PHENERGAN) 25 MG tablet Take 0.5-1 tablets (12.5-25 mg total) by mouth every 6 (six) hours as needed for nausea or vomiting., Starting Tue 05/09/2017, Print         Evalee Jefferson, PA-C 05/10/17 0330    Fredia Sorrow, MD 05/10/17 [phone]

## 2017-05-09 NOTE — ED Provider Notes (Signed)
Medical screening examination/treatment/procedure(s) were conducted as a shared visit with non-physician practitioner(s) and myself.  I personally evaluated the patient during the encounter.   EKG Interpretation None      Results for orders placed or performed during the hospital encounter of 05/09/17  CBC with Differential  Result Value Ref Range   WBC 5.0 4.0 - 10.5 K/uL   RBC 4.09 3.87 - 5.11 MIL/uL   Hemoglobin 13.3 12.0 - 15.0 g/dL   HCT 38.8 36.0 - 46.0 %   MCV 94.9 78.0 - 100.0 fL   MCH 32.5 26.0 - 34.0 pg   MCHC 34.3 30.0 - 36.0 g/dL   RDW 13.5 11.5 - 15.5 %   Platelets 224 150 - 400 K/uL   Neutrophils Relative % 69 %   Neutro Abs 3.4 1.7 - 7.7 K/uL   Lymphocytes Relative 16 %   Lymphs Abs 0.8 0.7 - 4.0 K/uL   Monocytes Relative 11 %   Monocytes Absolute 0.6 0.1 - 1.0 K/uL   Eosinophils Relative 4 %   Eosinophils Absolute 0.2 0.0 - 0.7 K/uL   Basophils Relative 0 %   Basophils Absolute 0.0 0.0 - 0.1 K/uL  Comprehensive metabolic panel  Result Value Ref Range   Sodium 136 135 - 145 mmol/L   Potassium 3.6 3.5 - 5.1 mmol/L   Chloride 102 101 - 111 mmol/L   CO2 25 22 - 32 mmol/L   Glucose, Bld 95 65 - 99 mg/dL   BUN 10 6 - 20 mg/dL   Creatinine, Ser 0.78 0.44 - 1.00 mg/dL   Calcium 9.0 8.9 - 10.3 mg/dL   Total Protein 7.3 6.5 - 8.1 g/dL   Albumin 4.2 3.5 - 5.0 g/dL   AST 39 15 - 41 U/L   ALT 30 14 - 54 U/L   Alkaline Phosphatase 55 38 - 126 U/L   Total Bilirubin 0.6 0.3 - 1.2 mg/dL   GFR calc non Af Amer >60 >60 mL/min   GFR calc Af Amer >60 >60 mL/min   Anion gap 9 5 - 15  Lipase, blood  Result Value Ref Range   Lipase 14 11 - 51 U/L  Urinalysis, Routine w reflex microscopic  Result Value Ref Range   Color, Urine YELLOW YELLOW   APPearance CLEAR CLEAR   Specific Gravity, Urine 1.012 1.005 - 1.030   pH 6.0 5.0 - 8.0   Glucose, UA NEGATIVE NEGATIVE mg/dL   Hgb urine dipstick SMALL (A) NEGATIVE   Bilirubin Urine NEGATIVE NEGATIVE   Ketones, ur NEGATIVE  NEGATIVE mg/dL   Protein, ur NEGATIVE NEGATIVE mg/dL   Nitrite NEGATIVE NEGATIVE   Leukocytes, UA LARGE (A) NEGATIVE   RBC / HPF 0-5 0 - 5 RBC/hpf   WBC, UA 0-5 0 - 5 WBC/hpf   Bacteria, UA NONE SEEN NONE SEEN   Squamous Epithelial / LPF 0-5 (A) NONE SEEN   Mucous PRESENT    Hyaline Casts, UA PRESENT    No results found.  Patient seen by me along with the physician assistant. Patient now had a couple visits for nausea which we think is secondary probably to the Septra antibiotic. The left great toe seems to be healing no evidence of any serious infection there. And by full be changed over. Hopefully this will correct the nausea problem. They will return for any new or worse symptoms.  Patient feels better. Left great toe with some erythema no drainage good cap refill. Abdomen soft nontender lungs clear.   Fredia Sorrow,  MD 05/09/17 1137

## 2017-05-10 ENCOUNTER — Encounter (HOSPITAL_COMMUNITY): Payer: Self-pay | Admitting: *Deleted

## 2017-05-10 NOTE — Progress Notes (Signed)
Pt denies SOB, chest pain, and being under the care of a cardiologist. Pt denies having a stress test, echo and cardiac cath. Pt denies having a chest x ray within the last year. Pt made aware to stop taking  Aspirin,vitamins, fish oil and herbal medications. Do not take any NSAIDs ie: Ibuprofen, Advil, Naproxen, BC and Goody Powder or any medication containing Aspirin. Pt verbalized understanding of all pre-op instructions.

## 2017-05-11 ENCOUNTER — Encounter (HOSPITAL_COMMUNITY): Payer: Self-pay | Admitting: Certified Registered Nurse Anesthetist

## 2017-05-11 ENCOUNTER — Ambulatory Visit (HOSPITAL_COMMUNITY)
Admission: RE | Admit: 2017-05-11 | Discharge: 2017-05-11 | Disposition: A | Discharge status: Home / Self Care | Payer: Medicare HMO | Source: Ambulatory Visit | Attending: Oculoplastics Ophthalmology | Admitting: Oculoplastics Ophthalmology

## 2017-05-11 ENCOUNTER — Encounter (HOSPITAL_COMMUNITY)
Admission: RE | Disposition: A | Discharge status: Home / Self Care | Payer: Self-pay | Source: Ambulatory Visit | Attending: Oculoplastics Ophthalmology

## 2017-05-11 ENCOUNTER — Ambulatory Visit (HOSPITAL_COMMUNITY): Payer: Medicare HMO | Admitting: Anesthesiology

## 2017-05-11 DIAGNOSIS — H02402 Unspecified ptosis of left eyelid: Secondary | ICD-10-CM | POA: Diagnosis not present

## 2017-05-11 DIAGNOSIS — H02422 Myogenic ptosis of left eyelid: Secondary | ICD-10-CM | POA: Diagnosis not present

## 2017-05-11 DIAGNOSIS — I10 Essential (primary) hypertension: Secondary | ICD-10-CM | POA: Insufficient documentation

## 2017-05-11 DIAGNOSIS — Z7982 Long term (current) use of aspirin: Secondary | ICD-10-CM | POA: Insufficient documentation

## 2017-05-11 DIAGNOSIS — Z6838 Body mass index (BMI) 38.0-38.9, adult: Secondary | ICD-10-CM | POA: Insufficient documentation

## 2017-05-11 DIAGNOSIS — K219 Gastro-esophageal reflux disease without esophagitis: Secondary | ICD-10-CM | POA: Insufficient documentation

## 2017-05-11 DIAGNOSIS — F329 Major depressive disorder, single episode, unspecified: Secondary | ICD-10-CM | POA: Insufficient documentation

## 2017-05-11 DIAGNOSIS — G4730 Sleep apnea, unspecified: Secondary | ICD-10-CM | POA: Diagnosis not present

## 2017-05-11 DIAGNOSIS — Z79899 Other long term (current) drug therapy: Secondary | ICD-10-CM | POA: Diagnosis not present

## 2017-05-11 DIAGNOSIS — C50919 Malignant neoplasm of unspecified site of unspecified female breast: Secondary | ICD-10-CM | POA: Diagnosis not present

## 2017-05-11 HISTORY — PX: PTOSIS REPAIR: SHX6568

## 2017-05-11 HISTORY — DX: Unspecified ptosis of left eyelid: H02.402

## 2017-05-11 LAB — PROTIME-INR
INR: 0.94
Prothrombin Time: 12.5 seconds (ref 11.4–15.2)

## 2017-05-11 LAB — URINE CULTURE: Special Requests: NORMAL

## 2017-05-11 SURGERY — REPAIR, BLEPHAROPTOSIS
Anesthesia: Monitor Anesthesia Care | Site: Eye | Laterality: Left

## 2017-05-11 MED ORDER — NEOMYCIN-POLYMYXIN-DEXAMETH 3.5-10000-0.1 OP OINT
TOPICAL_OINTMENT | OPHTHALMIC | Status: AC
  Filled 2017-05-11: qty 3.5

## 2017-05-11 MED ORDER — PROPOFOL 10 MG/ML IV BOLUS
INTRAVENOUS | Status: AC
  Filled 2017-05-11: qty 20

## 2017-05-11 MED ORDER — BSS IO SOLN
INTRAOCULAR | Status: AC
Start: 2017-05-11 — End: 2017-05-11
  Filled 2017-05-11: qty 15

## 2017-05-11 MED ORDER — HYDROCODONE-ACETAMINOPHEN 5-325 MG PO TABS: 1.0000 | ORAL_TABLET | Freq: Four times a day (QID) | ORAL | 0 refills | Status: AC | PRN

## 2017-05-11 MED ORDER — FENTANYL CITRATE (PF) 100 MCG/2ML IJ SOLN
INTRAMUSCULAR | Status: DC | PRN
  Administered 2017-05-11: 50 ug via INTRAVENOUS
  Administered 2017-05-11 (×2): 25 ug via INTRAVENOUS

## 2017-05-11 MED ORDER — FENTANYL CITRATE (PF) 250 MCG/5ML IJ SOLN
INTRAMUSCULAR | Status: AC
  Filled 2017-05-11: qty 5

## 2017-05-11 MED ORDER — SODIUM CHLORIDE 0.9 % IV SOLN
INTRAVENOUS | Status: DC
  Administered 2017-05-11 (×2): via INTRAVENOUS

## 2017-05-11 MED ORDER — TOBRAMYCIN-DEXAMETHASONE 0.3-0.1 % OP OINT
TOPICAL_OINTMENT | OPHTHALMIC | Status: AC
  Filled 2017-05-11: qty 3.5

## 2017-05-11 MED ORDER — BUPIVACAINE HCL (PF) 0.5 % IJ SOLN
INTRAMUSCULAR | Status: AC
  Filled 2017-05-11: qty 30

## 2017-05-11 MED ORDER — PROMETHAZINE HCL 25 MG/ML IJ SOLN: 6.2500 mg | INTRAMUSCULAR | Status: DC | PRN

## 2017-05-11 MED ORDER — TETRACAINE HCL 0.5 % OP SOLN
OPHTHALMIC | Status: AC
  Filled 2017-05-11: qty 2

## 2017-05-11 MED ORDER — PROPOFOL 10 MG/ML IV BOLUS
INTRAVENOUS | Status: DC | PRN
  Administered 2017-05-11 (×3): 20 mg via INTRAVENOUS

## 2017-05-11 MED ORDER — PHENYLEPHRINE HCL 2.5 % OP SOLN
OPHTHALMIC | Status: AC
Start: 2017-05-11 — End: 2017-05-11
  Filled 2017-05-11: qty 2

## 2017-05-11 MED ORDER — LIDOCAINE-EPINEPHRINE 1 %-1:100000 IJ SOLN
INTRAMUSCULAR | Status: DC | PRN
  Administered 2017-05-11: 14:00:00 via INTRAMUSCULAR

## 2017-05-11 MED ORDER — LIDOCAINE-EPINEPHRINE 1 %-1:100000 IJ SOLN
INTRAMUSCULAR | Status: AC
  Filled 2017-05-11: qty 1

## 2017-05-11 MED ORDER — PROPOFOL 1000 MG/100ML IV EMUL
INTRAVENOUS | Status: AC
  Filled 2017-05-11: qty 100

## 2017-05-11 MED ORDER — LIDOCAINE 2% (20 MG/ML) 5 ML SYRINGE
INTRAMUSCULAR | Status: DC | PRN
  Administered 2017-05-11: 40 mg via INTRAVENOUS

## 2017-05-11 MED ORDER — PROPOFOL 500 MG/50ML IV EMUL
INTRAVENOUS | Status: DC | PRN
  Administered 2017-05-11: 100 ug/kg/min via INTRAVENOUS

## 2017-05-11 MED ORDER — LIDOCAINE HCL 2 % IJ SOLN
INTRAMUSCULAR | Status: AC
  Filled 2017-05-11: qty 20

## 2017-05-11 MED ORDER — TOBRAMYCIN-DEXAMETHASONE 0.3-0.1 % OP OINT
TOPICAL_OINTMENT | OPHTHALMIC | Status: DC | PRN
  Administered 2017-05-11: 1 via OPHTHALMIC

## 2017-05-11 MED ORDER — HYDROMORPHONE HCL 1 MG/ML IJ SOLN: 0.2500 mg | INTRAMUSCULAR | Status: DC | PRN

## 2017-05-11 MED ORDER — PROPARACAINE HCL 0.5 % OP SOLN
1.0000 [drp] | OPHTHALMIC | Status: DC | PRN
  Administered 2017-05-11 (×3): 1 [drp] via OPHTHALMIC
  Filled 2017-05-11: qty 15

## 2017-05-11 MED ORDER — LIDOCAINE 2% (20 MG/ML) 5 ML SYRINGE
INTRAMUSCULAR | Status: AC
  Filled 2017-05-11: qty 5

## 2017-05-11 SURGICAL SUPPLY — 25 items
APL SRG 3 HI ABS STRL LF PLS (MISCELLANEOUS) ×1
APPLICATOR COTTON TIP 6IN STRL (MISCELLANEOUS) ×3 IMPLANT
APPLICATOR DR MATTHEWS STRL (MISCELLANEOUS) ×3 IMPLANT
CLOSURE STERI-STRIP 1/2X4 (GAUZE/BANDAGES/DRESSINGS) ×1
CLOSURE WOUND 1/2 X4 (GAUZE/BANDAGES/DRESSINGS) ×1
CLSR STERI-STRIP ANTIMIC 1/2X4 (GAUZE/BANDAGES/DRESSINGS) ×2 IMPLANT
CORDS BIPOLAR (ELECTRODE) IMPLANT
COVER SURGICAL LIGHT HANDLE (MISCELLANEOUS) ×3 IMPLANT
DRAPE ORTHO SPLIT 87X125 STRL (DRAPES) ×3 IMPLANT
DRAPE SURG 17X23 STRL (DRAPES) ×6 IMPLANT
FORCEPS BIPOLAR SPETZLER 8 1.0 (NEUROSURGERY SUPPLIES) IMPLANT
GLOVE BIO SURGEON STRL SZ7.5 (GLOVE) ×6 IMPLANT
GOWN STRL REUS W/ TWL LRG LVL3 (GOWN DISPOSABLE) ×2 IMPLANT
GOWN STRL REUS W/TWL LRG LVL3 (GOWN DISPOSABLE) ×6
NEEDLE PRECISIONGLIDE 27X1.5 (NEEDLE) IMPLANT
NS IRRIG 1000ML POUR BTL (IV SOLUTION) ×3 IMPLANT
PACK CATARACT CUSTOM (CUSTOM PROCEDURE TRAY) ×3 IMPLANT
PAD ARMBOARD 7.5X6 YLW CONV (MISCELLANEOUS) ×6 IMPLANT
STRIP CLOSURE SKIN 1/2X4 (GAUZE/BANDAGES/DRESSINGS) ×2 IMPLANT
SUT ETHILON 7 0 P 1 (SUTURE) IMPLANT
SUT PLAIN 5 0 P 3 18 (SUTURE) ×3 IMPLANT
SUT SILK 4 0 P 3 (SUTURE) ×3 IMPLANT
SUT SILK 6 0 BV 1XDISCX (SUTURE) IMPLANT
TOWEL OR 17X24 6PK STRL BLUE (TOWEL DISPOSABLE) ×6 IMPLANT
WATER STERILE IRR 1000ML POUR (IV SOLUTION) ×3 IMPLANT

## 2017-05-11 NOTE — Anesthesia Postprocedure Evaluation (Addendum)
Anesthesia Post Note  Patient: Crystal [name]  Procedure(s) Performed: Procedure(s) (LRB): INTERNAL PTOSIS REPAIR OF LEFT EYELID (Left)  Patient location during evaluation: PACU Anesthesia Type: MAC Level of consciousness: awake and alert Pain management: pain level controlled Vital Signs Assessment: post-procedure vital signs reviewed and stable Respiratory status: spontaneous breathing and respiratory function stable Cardiovascular status: stable Anesthetic complications: no       Last Vitals:  Vitals:   05/11/17 1359 05/11/17 1410  BP: (!) 143/58 (!) 136/57  Pulse: 77 78  Resp: 13 13  Temp: 36.9 C 36.9 C    Last Pain:  Vitals:   05/11/17 1410  TempSrc:   PainSc: [name]

## 2017-05-11 NOTE — Anesthesia Preprocedure Evaluation (Addendum)
Anesthesia Evaluation  Patient identified by MRN, date of birth, ID band Patient awake    Reviewed: Allergy & Precautions, NPO status , Patient's Chart, lab work & pertinent test results  History of Anesthesia Complications (+) PONV and history of anesthetic complications  Airway Mallampati: II       Dental  (+) Teeth Intact   Pulmonary sleep apnea and Continuous Positive Airway Pressure Ventilation ,    Pulmonary exam normal        Cardiovascular hypertension, Pt. on medications  Rhythm:Regular Rate:Normal     Neuro/Psych PSYCHIATRIC DISORDERS Depression    GI/Hepatic Neg liver ROS, GERD  Medicated and Controlled,  Endo/Other  Morbid obesity  Renal/GU negative Renal ROS     Musculoskeletal   Abdominal   Peds  Hematology   Anesthesia Other Findings   Reproductive/Obstetrics                             Anesthesia Physical  Anesthesia Plan  ASA: III  Anesthesia Plan: MAC   Post-op Pain Management:    Induction:   Airway Management Planned: Natural Airway and Nasal Cannula  Additional Equipment:   Intra-op Plan:   Post-operative Plan:   Informed Consent: I have reviewed the patients History and Physical, chart, labs and discussed the procedure including the risks, benefits and alternatives for the proposed anesthesia with the patient or authorized representative who has indicated his/her understanding and acceptance.   Dental advisory given  Plan Discussed with: Anesthesiologist, CRNA and Surgeon  Anesthesia Plan Comments:       Anesthesia Quick Evaluation

## 2017-05-11 NOTE — Progress Notes (Signed)
Pt notified RN that she is taking antibiotics for swollen great toe, toe has a small amt of swelling around nail and pink color. Not warm or painful to touch. No open or draining areas. Dr. Kristeen Miss notified.

## 2017-05-11 NOTE — Transfer of Care (Signed)
Immediate Anesthesia Transfer of Care Note  Patient: Crystal [name]  Procedure(s) Performed: Procedure(s): INTERNAL PTOSIS REPAIR OF LEFT EYELID (Left)  Patient Location: PACU  Anesthesia Type:MAC  Level of Consciousness: awake, patient cooperative and responds to stimulation  Airway & Oxygen Therapy: Patient Spontanous Breathing  Post-op Assessment: Report given to RN and Post -op Vital signs reviewed and stable  Post vital signs: Reviewed and stable  Last Vitals:  Vitals:   05/11/17 1100 05/11/17 1359  BP: (!) 157/81 (!) 143/58  Pulse: 93 77  Resp: 18 13  Temp: 36.8 C 36.9 C    Last Pain:  Vitals:   05/11/17 1359  TempSrc:   PainSc: Asleep      Patients Stated Pain Goal: 4 (97/41/63 8453)  Complications: No apparent anesthesia complications

## 2017-05-11 NOTE — Brief Op Note (Signed)
05/11/2017  11:14 AM  PATIENT:  Crystal [name]  76 y.o. female  PRE-OPERATIVE DIAGNOSIS:  INTERNAL PTOSIS OF LEFT  EYELID  POST-OPERATIVE DIAGNOSIS:  * No post-op diagnosis entered *  PROCEDURE:  Procedure(s): INTERNAL PTOSIS REPAIR OF LEFT EYELID (Left)  SURGEON:  Surgeon(s) and Role:    * Rekia Kujala, Peyton Najjar, MD - Primary  PHYSICIAN ASSISTANT:   ASSISTANTS: none   ANESTHESIA:   local and IV sedation  EBL:  No intake/output data recorded.  BLOOD ADMINISTERED:none  DRAINS: none   LOCAL MEDICATIONS USED:  BUPIVICAINE , LIDOCAINE  and Amount: 2 ml  SPECIMEN:  No Specimen  DISPOSITION OF SPECIMEN:  PATHOLOGY  COUNTS:  YES  TOURNIQUET:  * No tourniquets in log *  DICTATION: .Note written in EPIC  PLAN OF CARE: Discharge to home after PACU  PATIENT DISPOSITION:  PACU - hemodynamically stable.   Delay start of Pharmacological VTE agent (>24hrs) due to surgical blood loss or risk of bleeding: no

## 2017-05-11 NOTE — Op Note (Signed)
Procedure(s): INTERNAL PTOSIS REPAIR OF LEFT EYELID Procedure Note  JAKEIA CARRERAS female 76 y.o. 05/11/2017  Procedure(s) and Anesthesia Type:    * INTERNAL PTOSIS REPAIR OF LEFT EYELID - General  Surgeon(s) and Role:    * Chaitra Mast, Peyton Najjar, MD - Primary     Surgeon: Clista Bernhardt   Assistants: none  Anesthesia: Monitored Local Anesthesia with Sedation  ASA Class: 3    Procedure Detail  INTERNAL PTOSIS REPAIR OF LEFT EYELID  INDICATIONS:  This patient presents with LEFT upper eyelid position that is too low (MRD1 < 2) and good levator function. The position of the eyelid is causing visual dysfunction that interferes with tasks of daily living including reading and computer use due to the superiorvisual field defect.  Informed consent had been provided prior to the day of surgery at the clinic visit. A handout was provided and the patient was asked to sign an eyelid informed consent specific to this procedure. All questions were answered.  The patient understands that the goal of surgery is to improve visual function. This is not a cosmetic procedure, no skin or fat is being removed. Prior to entering the operative suite, the general surgical consent was signed. All questions were answered.  The patient understands the risks of surgery include but are not limited to bleeding, infection, scar formation, asymmetry, the need for additional surgical intervention and other less common outcomes noted on the specific eyelid surgery consent and anesthesia risks listed on the anesthesia consent. The patient accepts the risks and desires to proceed with surgery as the position of the upper eyelid blocks the superior visual field interrupting tasks of daily living to include reading, driving and computer use.   PROCEDURE :  The patient was taken to the operating room and placed in the supine position with the usual monitoring in place. The patient was given a drop of Proparacaine 0.5%,  then prepped and draped in the usual standard fashion. The lateral eyelid was injected with standard oculoplastic block in the region of entry and exit for the running suture. The eyelid was then everted over a desmarres retractor and injected with the standard oculoplastic block.    In clinic, the patient had complete correction with one drop of phenylephrine. The goal was to perform a 7 mm tuck in the conjunctiva, so the conjunctiva was marked at 3.5 mm from the tarsal border in a medial, central and lateral position with three interrupted 6-0 silk sutures which were then secured with a steristrip. The sutures were then pulled inferiorly and the putterman ptosis clamp was placed in standard fashion.    Using the Putterman ptosis clamp to provide traction, a 5-0 gut suture was passed through the upper lid laterally approximately 2 mm from the eyelashes and passed in a running fashion from lateral aspect of the Putterman ptosis clamp medially and back again laterally and then brought out through the skin adjacent to the entry location and tied externally to avoid corneal irritation.  The silk suture and approximately 1 mm of conjunctiva and mueller's muscle was excised in such a fashion to create a smooth internal wound edge. The external knot was then covered with a 1 cm steristrip.    Tobradex 0.3% ointment were administered to the surgical eye, The patient was taken to the PACU in good condition.  Findings: none  Estimated Blood Loss:  Minimal         Drains: none  Total IV Fluids: <1Lml  Blood Given: none          Specimens: none         Implants: none        Complications:  * No complications entered in OR log *         Disposition: PACU - hemodynamically stable.         Condition: stable

## 2017-05-11 NOTE — H&P (Addendum)
Subjective:     Crystal [name] is a 76 y.o. female who presents for evaluation of left myogenic ptosis. The pain is described as none and is 0/10 in intensity. Symptoms have been unchanged since. Aggravating factors: none.  Alleviating factors: nothing. Associated symptoms: The patient denies other associated symptoms..   Patient History:  The following portions of the patient's history were reviewed and updated as appropriate: allergies, current medications, past family history, past medical history, past social history, past surgical history and problem list.  Review of Systems Pertinent items are noted in HPI.    Objective:    BP (!) 157/81   Pulse 93   Temp 98.3 F (36.8 C) (Oral)   Resp 18   Ht 5\' 3"  (1.6 m)   Wt 99.8 kg (220 lb)   SpO2 98%   BMI 38.97 kg/m   General:  alert, cooperative and appears stated age  Skin:  normal  Eyes: positive findings: eyelids/periorbital: ptosis on the left  Mouth: MMM no lesions  Lymph Nodes:  Cervical, supraclavicular, and axillary nodes normal.  Lungs:  clear to auscultation bilaterally  Heart:  regular rate and rhythm, S1, S2 normal, no murmur, click, rub or gallop  Abdomen: soft, non-tender; bowel sounds normal; no masses,  no organomegaly  CVA:  absent  Genitourinary: defer exam  Extremities:  extremities normal, atraumatic, no cyanosis or edema  Neurologic:  Alert and oriented x3. Gait normal. Reflexes and motor strength normal and symmetric. Cranial nerves 2-12 and sensation grossly intact.  Psychiatric:  normal mood, behavior, speech, dress, and thought processes     Assessment:     Left Upper Eyelid Myogenic Ptosis     Plan:  Left Internal Ptosis Repair  1. Discussed the risk of surgery including bleeding, infection, scarring, need for reoperation and revision,  and the risks of general anesthetic including MI, CVA, sudden death or even reaction to anesthetic medications. The patient understands the risks, any and all  questions were answered to the patient's satisfaction. 3. Follow up: 1 week.  Date of Surgery Update (To be completed by Attending Surgeon day of surgery.) 05/11/18 @ 1 pm

## 2017-05-12 ENCOUNTER — Encounter (HOSPITAL_COMMUNITY): Payer: Self-pay | Admitting: Oculoplastics Ophthalmology

## 2017-05-17 ENCOUNTER — Encounter: Payer: Self-pay | Admitting: Pulmonary Disease

## 2017-05-17 ENCOUNTER — Ambulatory Visit (INDEPENDENT_AMBULATORY_CARE_PROVIDER_SITE_OTHER): Payer: Medicare HMO | Admitting: Pulmonary Disease

## 2017-05-17 VITALS — BP 120/80 | HR 96 | Ht 64.5 in | Wt 231.0 lb

## 2017-05-17 DIAGNOSIS — G4733 Obstructive sleep apnea (adult) (pediatric): Secondary | ICD-10-CM | POA: Diagnosis not present

## 2017-05-17 DIAGNOSIS — R05 Cough: Secondary | ICD-10-CM | POA: Diagnosis not present

## 2017-05-17 DIAGNOSIS — R053 Chronic cough: Secondary | ICD-10-CM

## 2017-05-17 LAB — NITRIC OXIDE: Nitric Oxide: 5

## 2017-05-17 NOTE — Patient Instructions (Signed)
Check FENO. Stop Advair if normal Continue Protonix Continue Flonase, loratadine, Benadryl at night  Return in 6 months

## 2017-05-17 NOTE — Progress Notes (Signed)
Subjective:    Patient ID: Crystal [name], female    DOB: 1941-03-08, 76 y.o.   MRN: [id number] PROBLEM LIST Chronic cough Post nasal drip OSA on CPAP 5-20  HPI Crystal [name] is a 76 year old with past medical history as below. Complaints of chronic cough for several years. This is nonproductive in nature not associated with shortness of breath, wheezing, chest pain, sputum production, hemoptysis. She has significant issues with her sinusitis and postnasal drip. She states that she can feel the drip at the back of her throat with irritation and constant throat clearing. She also has history of GERD and she is on Protonix. The symptoms were exacerbated on a trip to the beach 2 months ago. She saw her primary care physician who gave her a Z pack. She also got a chest x-ray. I do not have the report with me but is reportedly normal as per the patient. She is also on a Flonase nasal spray which was started a month ago. This has apparently not helped with her symptoms. She is on Cozaar for her blood pressure medication. But she is unable to state if her symptoms of cough began when Cozaar was started.  She has history of OSA on CPAP. No history of allergies or exposures. She is a never smoker and does not drink alcohol.   Interim history: She continues on Advair with well-controlled symptoms. She does not need to use her rescue inhaler. She states that the cough is slightly worse due to allergies  DATA: CXR 04/28/16 Stable linear scarring of the lingula compared to 2016. I have reviewed her images personally.  Sleep study 12/19/15 Moderate sleep apnea  PFTs 04/25/16 FVC 3 [105%) FEV1 2.5 to [180%) F/F 84 TLC 190% DLCO 81%. No obstruction, restriction. Minimal diffusion defect.  FENO 05/17/17- 5  Past Medical History:  Diagnosis Date  . Breast cancer ([place]) 03/21/15   right  . Chronic fatigue   . Colitis, ischemic ([place]) 08/2007, 05/2011   Last colonoscopy/bx by Dr Rourk->(08/31/07) descending  colon  . Depression   . Diverticulosis   . Enlarged thyroid   . GERD (gastroesophageal reflux disease)   . HTN (hypertension)   . OA (osteoarthritis)   . Obesity   . PONV (postoperative nausea and vomiting)   . Ptosis of eyelid, left   . Sleep apnea    cpap  . Spinal stenosis   . Tubular adenoma of colon 08/2007     Current Outpatient Prescriptions:  .  acetaminophen (TYLENOL ARTHRITIS PAIN) 650 MG CR tablet, Take 1,300 mg by mouth 2 (two) times daily. , Disp: , Rfl:  .  amLODipine (NORVASC) 10 MG tablet, Take 10 mg by mouth every evening. , Disp: , Rfl:  .  anastrozole (ARIMIDEX) 1 MG tablet, TAKE 1 TABLET EVERY DAY, Disp: 90 tablet, Rfl: 1 .  Artificial Tear Solution (SOOTHE XP OP), Place 1 drop into both eyes 2 (two) times daily., Disp: , Rfl:  .  aspirin EC 81 MG tablet, Take 81 mg by mouth daily.  , Disp: , Rfl:  .  buPROPion (WELLBUTRIN SR) 150 MG 12 hr tablet, Take 150 mg by mouth 2 (two) times daily. , Disp: , Rfl:  .  Calcium-Vitamin D (CALTRATE 600 PLUS-VIT D PO), Take 1 tablet by mouth daily., Disp: , Rfl:  .  cephALEXin (KEFLEX) 500 MG capsule, Take 1 capsule (500 mg total) by mouth 4 (four) times daily., Disp: 28 capsule, Rfl: 0 .  diphenhydrAMINE (BENADRYL) 25  MG tablet, Take 50 mg by mouth at bedtime as needed for sleep., Disp: , Rfl:  .  FLUoxetine (PROZAC) 20 MG capsule, Take 60 mg by mouth daily. , Disp: , Rfl:  .  fluticasone (FLONASE) 50 MCG/ACT nasal spray, Place 2 sprays into both nostrils daily. , Disp: , Rfl:  .  Fluticasone-Salmeterol (ADVAIR DISKUS) 100-50 MCG/DOSE AEPB, Inhale 1 puff into the lungs twice daily.  NEEDS APPT FOR REFILLS (Patient taking differently: Inhale 1 puff into the lungs 2 (two) times daily. Inhale 1 puff into the lungs twice daily.  NEEDS APPT FOR REFILLS), Disp: 60 each, Rfl: 1 .  loratadine (CLARITIN) 10 MG tablet, Take 10 mg by mouth daily.  , Disp: , Rfl:  .  losartan-hydrochlorothiazide (HYZAAR) 100-25 MG per tablet, Take 1 tablet by  mouth daily., Disp: , Rfl:  .  ondansetron (ZOFRAN ODT) 4 MG disintegrating tablet, Take 1 tablet (4 mg total) by mouth every 8 (eight) hours as needed for nausea or vomiting., Disp: 20 tablet, Rfl: 0 .  pantoprazole (PROTONIX) 40 MG tablet, TAKE 1 TABLET BY MOUTH TWICE DAILY (Patient taking differently: TAKE 1 TABLET BY MOUTH DAILY 30 MINUTES BEFORE BREAKFAST), Disp: 60 tablet, Rfl: 5 .  promethazine (PHENERGAN) 25 MG tablet, Take 0.5-1 tablets (12.5-25 mg total) by mouth every 6 (six) hours as needed for nausea or vomiting., Disp: 30 tablet, Rfl: 0 .  simvastatin (ZOCOR) 10 MG tablet, Take 10 mg by mouth at bedtime. , Disp: , Rfl:  .  Vitamin D, Ergocalciferol, (DRISDOL) 50000 units CAPS capsule, TAKE 1 CAPSULE BY MOUTH ONCE A WEEK, Disp: 12 capsule, Rfl: 0  Review of Systems Review of Systems  Constitutional: Negative for fever, chills and unexpected weight change.  HENT: Positive for postnasal drip. Negative for congestion, dental problem, ear pain, nosebleeds, rhinorrhea, sinus pressure, sneezing, sore throat, trouble swallowing and voice change.  Eyes: Negative for visual disturbance.  Respiratory: Positive for cough. Negative for choking and shortness of breath.  Cardiovascular: Negative for chest pain and leg swelling.  Gastrointestinal: Negative for vomiting, abdominal pain and diarrhea.  Genitourinary: Negative for difficulty urinating.  Musculoskeletal: Negative for arthralgias.  Skin: Negative for rash.  Neurological: Negative for tremors, syncope and headaches.  Hematological: Does not bruise/bleed easily.     Objective:   Physical Exam  Blood pressure 120/80, pulse 96, height 5' 4.5" (1.638 m), weight 231 lb (104.8 kg), SpO2 97 %. Gen:      No acute distress HEENT:  EOMI, sclera anicteric Neck:     No masses; no thyromegaly Lungs:    Clear to auscultation bilaterally; normal respiratory effort CV:         Regular rate and rhythm; no murmurs Abd:      + bowel sounds;  soft, non-tender; no palpable masses, no distension Ext:    No edema; adequate peripheral perfusion Skin:      Warm and dry; no rash Neuro: alert and oriented x 3 Psych: normal mood and affect    Assessment & Plan:  #1 Upper airway cough syndrome Cough secondary to allergic rhinitis, postnasal drip with contribution of GERD. Suspicion for asthma is low and FENO is normal We'll stop the Advair and monitor for symptoms Continue Flonase, loratadine and Benadryl at night for rhinitis, postnasal drip. Continue Protonix for GERD  #2 OSA. Compliant with CPAP AutoSet. Continue same.  Plan: - Continue Flonase, Protonix, Advair. - Continue CPAP. Replace mask.  Return in [place] MD [facility] Pager [phone] If no answer or after 3pm call: [phone] 05/17/2017, 10:18 AM

## 2017-05-27 ENCOUNTER — Encounter (HOSPITAL_COMMUNITY): Payer: Self-pay | Admitting: Oculoplastics Ophthalmology

## 2017-05-27 NOTE — Addendum Note (Signed)
Addendum  created 05/27/17 1018 by Gionni Freese, MD   Sign clinical note    

## 2017-06-12 ENCOUNTER — Ambulatory Visit (HOSPITAL_COMMUNITY)
Admission: RE | Admit: 2017-06-12 | Discharge: 2017-06-12 | Disposition: A | Discharge status: Home / Self Care | Payer: Medicare HMO | Source: Ambulatory Visit | Attending: Adult Health | Admitting: Adult Health

## 2017-06-12 DIAGNOSIS — Z79811 Long term (current) use of aromatase inhibitors: Secondary | ICD-10-CM | POA: Insufficient documentation

## 2017-06-12 DIAGNOSIS — M8580 Other specified disorders of bone density and structure, unspecified site: Secondary | ICD-10-CM | POA: Diagnosis present

## 2017-06-12 DIAGNOSIS — M8588 Other specified disorders of bone density and structure, other site: Secondary | ICD-10-CM | POA: Diagnosis not present

## 2017-06-12 DIAGNOSIS — M85852 Other specified disorders of bone density and structure, left thigh: Secondary | ICD-10-CM | POA: Diagnosis not present

## 2017-06-12 DIAGNOSIS — Z780 Asymptomatic menopausal state: Secondary | ICD-10-CM | POA: Diagnosis not present

## 2017-07-03 DIAGNOSIS — H02422 Myogenic ptosis of left eyelid: Secondary | ICD-10-CM | POA: Diagnosis not present

## 2017-07-17 DIAGNOSIS — R05 Cough: Secondary | ICD-10-CM | POA: Diagnosis not present

## 2017-07-17 DIAGNOSIS — M65312 Trigger thumb, left thumb: Secondary | ICD-10-CM | POA: Diagnosis not present

## 2017-07-17 DIAGNOSIS — I10 Essential (primary) hypertension: Secondary | ICD-10-CM | POA: Diagnosis not present

## 2017-07-31 ENCOUNTER — Encounter (HOSPITAL_COMMUNITY): Payer: Self-pay

## 2017-07-31 ENCOUNTER — Encounter (HOSPITAL_COMMUNITY): Payer: Medicare HMO | Attending: Oncology

## 2017-07-31 ENCOUNTER — Encounter (HOSPITAL_COMMUNITY): Payer: Self-pay | Admitting: Adult Health

## 2017-07-31 ENCOUNTER — Encounter (HOSPITAL_COMMUNITY): Payer: Medicare HMO

## 2017-07-31 DIAGNOSIS — Z170 Estrogen receptor positive status [ER+]: Secondary | ICD-10-CM | POA: Diagnosis not present

## 2017-07-31 DIAGNOSIS — C50411 Malignant neoplasm of upper-outer quadrant of right female breast: Secondary | ICD-10-CM | POA: Insufficient documentation

## 2017-07-31 DIAGNOSIS — M8588 Other specified disorders of bone density and structure, other site: Secondary | ICD-10-CM

## 2017-07-31 DIAGNOSIS — M8580 Other specified disorders of bone density and structure, unspecified site: Secondary | ICD-10-CM

## 2017-07-31 LAB — CBC WITH DIFFERENTIAL/PLATELET
Basophils Absolute: 0 10*3/uL (ref 0.0–0.1)
Basophils Relative: 0 %
Eosinophils Absolute: 0.2 10*3/uL (ref 0.0–0.7)
Eosinophils Relative: 4 %
HEMATOCRIT: 36.3 % (ref 36.0–46.0)
HEMOGLOBIN: 12.3 g/dL (ref 12.0–15.0)
LYMPHS ABS: 1.2 10*3/uL (ref 0.7–4.0)
LYMPHS PCT: 28 %
MCH: 33.1 pg (ref 26.0–34.0)
MCHC: 33.9 g/dL (ref 30.0–36.0)
MCV: 97.6 fL (ref 78.0–100.0)
Monocytes Absolute: 0.4 10*3/uL (ref 0.1–1.0)
Monocytes Relative: 9 %
NEUTROS ABS: 2.5 10*3/uL (ref 1.7–7.7)
NEUTROS PCT: 59 %
PLATELETS: 224 10*3/uL (ref 150–400)
RBC: 3.72 MIL/uL — AB (ref 3.87–5.11)
RDW: 13.4 % (ref 11.5–15.5)
WBC: 4.4 10*3/uL (ref 4.0–10.5)

## 2017-07-31 LAB — COMPREHENSIVE METABOLIC PANEL
ALBUMIN: 4.1 g/dL (ref 3.5–5.0)
ALT: 19 U/L (ref 14–54)
ANION GAP: 9 (ref 5–15)
AST: 24 U/L (ref 15–41)
Alkaline Phosphatase: 59 U/L (ref 38–126)
BILIRUBIN TOTAL: 0.7 mg/dL (ref 0.3–1.2)
BUN: 17 mg/dL (ref 6–20)
CHLORIDE: 105 mmol/L (ref 101–111)
CO2: 26 mmol/L (ref 22–32)
Calcium: 9.2 mg/dL (ref 8.9–10.3)
Creatinine, Ser: 0.7 mg/dL (ref 0.44–1.00)
GFR calc Af Amer: >60 mL/min (ref >60)
GFR calc non Af Amer: >60 mL/min (ref >60)
GLUCOSE: 87 mg/dL (ref 65–99)
POTASSIUM: 4.2 mmol/L (ref 3.5–5.1)
Sodium: 140 mmol/L (ref 135–145)
TOTAL PROTEIN: 7 g/dL (ref 6.5–8.1)

## 2017-07-31 MED ORDER — DENOSUMAB 60 MG/ML ~~LOC~~ SOLN
60.0000 mg | Freq: Once | SUBCUTANEOUS | Status: AC
  Administered 2017-07-31: 60 mg via SUBCUTANEOUS
  Filled 2017-07-31: qty 1

## 2017-07-31 NOTE — Progress Notes (Signed)
Crystal [name] presents today for injection per the provider's orders.  Prolia administration without incident; see MAR for injection details.  Patient tolerated procedure well and without incident.  No questions or complaints noted at this time.  Discharged ambulatory.

## 2017-08-01 LAB — VITAMIN D 25 HYDROXY (VIT D DEFICIENCY, FRACTURES): Vit D, 25-Hydroxy: 29.9 ng/mL — ABNORMAL LOW (ref 30.0–100.0)

## 2017-08-02 ENCOUNTER — Other Ambulatory Visit (HOSPITAL_COMMUNITY): Payer: Self-pay | Admitting: Adult Health

## 2017-08-02 ENCOUNTER — Encounter (HOSPITAL_COMMUNITY): Payer: Self-pay | Admitting: Adult Health

## 2017-08-02 ENCOUNTER — Other Ambulatory Visit (HOSPITAL_COMMUNITY): Payer: Self-pay | Admitting: Oncology

## 2017-08-02 DIAGNOSIS — C50411 Malignant neoplasm of upper-outer quadrant of right female breast: Secondary | ICD-10-CM

## 2017-08-03 ENCOUNTER — Other Ambulatory Visit (HOSPITAL_COMMUNITY): Payer: Self-pay | Admitting: Internal Medicine

## 2017-08-03 ENCOUNTER — Ambulatory Visit (HOSPITAL_COMMUNITY)
Admission: RE | Admit: 2017-08-03 | Discharge: 2017-08-03 | Disposition: A | Discharge status: Home / Self Care | Payer: Medicare HMO | Source: Ambulatory Visit | Attending: Internal Medicine | Admitting: Internal Medicine

## 2017-08-03 DIAGNOSIS — R52 Pain, unspecified: Secondary | ICD-10-CM

## 2017-08-03 DIAGNOSIS — Z9181 History of falling: Secondary | ICD-10-CM | POA: Diagnosis not present

## 2017-08-03 DIAGNOSIS — N3000 Acute cystitis without hematuria: Secondary | ICD-10-CM | POA: Diagnosis not present

## 2017-08-03 DIAGNOSIS — M25551 Pain in right hip: Secondary | ICD-10-CM | POA: Insufficient documentation

## 2017-08-03 DIAGNOSIS — R413 Other amnesia: Secondary | ICD-10-CM | POA: Diagnosis not present

## 2017-08-03 DIAGNOSIS — M160 Bilateral primary osteoarthritis of hip: Secondary | ICD-10-CM | POA: Insufficient documentation

## 2017-08-03 DIAGNOSIS — S79911A Unspecified injury of right hip, initial encounter: Secondary | ICD-10-CM | POA: Diagnosis not present

## 2017-08-07 DIAGNOSIS — M79642 Pain in left hand: Secondary | ICD-10-CM | POA: Diagnosis not present

## 2017-08-07 DIAGNOSIS — M180 Bilateral primary osteoarthritis of first carpometacarpal joints: Secondary | ICD-10-CM | POA: Diagnosis not present

## 2017-08-07 DIAGNOSIS — M65331 Trigger finger, right middle finger: Secondary | ICD-10-CM | POA: Diagnosis not present

## 2017-08-07 DIAGNOSIS — M65312 Trigger thumb, left thumb: Secondary | ICD-10-CM | POA: Diagnosis not present

## 2017-08-07 DIAGNOSIS — M79641 Pain in right hand: Secondary | ICD-10-CM | POA: Diagnosis not present

## 2017-08-08 ENCOUNTER — Other Ambulatory Visit (HOSPITAL_COMMUNITY): Payer: Self-pay | Admitting: Internal Medicine

## 2017-08-08 DIAGNOSIS — R2689 Other abnormalities of gait and mobility: Secondary | ICD-10-CM

## 2017-08-09 ENCOUNTER — Encounter (HOSPITAL_COMMUNITY): Payer: Self-pay | Admitting: Adult Health

## 2017-08-15 ENCOUNTER — Emergency Department (HOSPITAL_COMMUNITY): Payer: Medicare HMO

## 2017-08-15 ENCOUNTER — Encounter (HOSPITAL_COMMUNITY): Payer: Self-pay | Admitting: Emergency Medicine

## 2017-08-15 ENCOUNTER — Emergency Department (HOSPITAL_COMMUNITY)
Admission: EM | Admit: 2017-08-15 | Discharge: 2017-08-15 | Disposition: A | Discharge status: Home / Self Care | Payer: Medicare HMO | Attending: Emergency Medicine | Admitting: Emergency Medicine

## 2017-08-15 DIAGNOSIS — M791 Myalgia: Secondary | ICD-10-CM | POA: Diagnosis not present

## 2017-08-15 DIAGNOSIS — Y9389 Activity, other specified: Secondary | ICD-10-CM | POA: Insufficient documentation

## 2017-08-15 DIAGNOSIS — M25552 Pain in left hip: Secondary | ICD-10-CM | POA: Insufficient documentation

## 2017-08-15 DIAGNOSIS — W19XXXA Unspecified fall, initial encounter: Secondary | ICD-10-CM | POA: Diagnosis not present

## 2017-08-15 DIAGNOSIS — Y998 Other external cause status: Secondary | ICD-10-CM | POA: Insufficient documentation

## 2017-08-15 DIAGNOSIS — I10 Essential (primary) hypertension: Secondary | ICD-10-CM | POA: Diagnosis not present

## 2017-08-15 DIAGNOSIS — M5136 Other intervertebral disc degeneration, lumbar region: Secondary | ICD-10-CM | POA: Diagnosis not present

## 2017-08-15 DIAGNOSIS — M25551 Pain in right hip: Secondary | ICD-10-CM | POA: Diagnosis not present

## 2017-08-15 DIAGNOSIS — Y929 Unspecified place or not applicable: Secondary | ICD-10-CM | POA: Diagnosis not present

## 2017-08-15 DIAGNOSIS — S3993XA Unspecified injury of pelvis, initial encounter: Secondary | ICD-10-CM | POA: Diagnosis not present

## 2017-08-15 DIAGNOSIS — Z79899 Other long term (current) drug therapy: Secondary | ICD-10-CM | POA: Insufficient documentation

## 2017-08-15 MED ORDER — TRAMADOL HCL 50 MG PO TABS: 50.0000 mg | ORAL_TABLET | Freq: Four times a day (QID) | ORAL | 0 refills | Status: DC | PRN

## 2017-08-15 MED ORDER — OXYCODONE-ACETAMINOPHEN 5-325 MG PO TABS
1.0000 | ORAL_TABLET | Freq: Once | ORAL | Status: AC
  Administered 2017-08-15: 1 via ORAL
  Filled 2017-08-15: qty 1

## 2017-08-15 NOTE — ED Triage Notes (Signed)
Pt states while getting off her stationary bike her pants got caught on the machine and she fell landing on her butt. Denies hitting head.

## 2017-08-15 NOTE — Discharge Instructions (Signed)
Follow up with your md as needed °

## 2017-08-15 NOTE — ED Provider Notes (Signed)
[facility] DEPT Provider Note   CSN: [id number] Arrival date & time: 08/15/17  0720     History   Chief Complaint Chief Complaint  Patient presents with  . Fall    HPI Crystal [name] is a 76 y.o. female.  Patient states that she fell on her buttocks. Weeks ago and has been hurting in each buttock since then.   The history is provided by the patient.  Fall  This is a new problem. The current episode started more than 1 week ago. The problem occurs rarely. The problem has been resolved. Pertinent negatives include no chest pain, no abdominal pain and no headaches. Exacerbated by: Movement. Nothing relieves the symptoms. She has tried nothing for the symptoms. The treatment provided no relief.    Past Medical History:  Diagnosis Date  . Breast cancer ([place]) 03/21/15   right  . Chronic fatigue   . Colitis, ischemic ([place]) 08/2007, 05/2011   Last colonoscopy/bx by Dr Rourk->(08/31/07) descending colon  . Depression   . Diverticulosis   . Enlarged thyroid   . GERD (gastroesophageal reflux disease)   . HTN (hypertension)   . OA (osteoarthritis)   . Obesity   . PONV (postoperative nausea and vomiting)   . Ptosis of eyelid, left   . Sleep apnea    cpap  . Spinal stenosis   . Tubular adenoma of colon 08/2007    Patient Active Problem List   Diagnosis Date Noted  . Osteopenia determined by x-ray 07/23/2015  . Breast cancer ([place]) 03/31/2015  . Colitis, ischemic ([place]) 06/27/2011  . Tubular adenoma 06/27/2011    Past Surgical History:  Procedure Laterality Date  . ABDOMINAL HYSTERECTOMY     partial  . BREAST CYST EXCISION     benign, left x2, right x1  . CATARACT EXTRACTION W/PHACO  12/01/2011   Procedure: CATARACT EXTRACTION PHACO AND INTRAOCULAR LENS PLACEMENT (IOC);  Surgeon: Tonny Branch;  Location: [facility];  Service: Ophthalmology;  Laterality: Right;  CDE=14.87  . CATARACT EXTRACTION W/PHACO  02/06/2012   Procedure: CATARACT EXTRACTION PHACO AND INTRAOCULAR LENS  PLACEMENT (IOC);  Surgeon: Tonny Branch, MD;  Location: [facility];  Service: Ophthalmology;  Laterality: Left;  CDE 13.00  . COLONOSCOPY  Sept 2008   Last colonoscopy/bx by Dr Rourk->(08/31/07) descending colon TUBULAR ADENOMA  . COLONOSCOPY  10/25/2012   Procedure: COLONOSCOPY;  Surgeon: Daneil Dolin, MD;  Location: [facility]ITE;  Service: Endoscopy;  Laterality: N/A;  10:15-changed to 10:30 Darius Bump notified  . PARTIAL MASTECTOMY WITH NEEDLE LOCALIZATION AND AXILLARY SENTINEL LYMPH NODE BX Right 03/18/2015   Procedure: PARTIAL MASTECTOMY AFTER NEEDLE LOCALIZATION AND AXILLARY SENTINEL LYMPH NODE BX;  Surgeon: Aviva Signs Md, MD;  Location: [facility];  Service: General;  Laterality: Right;  . PTOSIS REPAIR Left 05/11/2017   Procedure: INTERNAL PTOSIS REPAIR OF LEFT EYELID;  Surgeon: Clista Bernhardt, MD;  Location: [facility];  Service: Ophthalmology;  Laterality: Left;  . TUBAL LIGATION  1970   hysterectomy    OB History    No data available       Home Medications    Prior to Admission medications   Medication Sig Start Date End Date Taking? Authorizing Provider  acetaminophen (TYLENOL ARTHRITIS PAIN) 650 MG CR tablet Take 1,300 mg by mouth 2 (two) times daily.     [provider]  amLODipine (NORVASC) 10 MG tablet Take 10 mg by mouth every evening.  10/02/12   [provider]  anastrozole (ARIMIDEX) 1  MG tablet TAKE 1 TABLET EVERY DAY 08/03/17   Holley Bouche, NP  Artificial Tear Solution (SOOTHE XP OP) Place 1 drop into both eyes 2 (two) times daily.    [provider]  aspirin EC 81 MG tablet Take 81 mg by mouth daily.      [provider]  buPROPion (WELLBUTRIN SR) 150 MG 12 hr tablet Take 150 mg by mouth 2 (two) times daily.  06/02/11   [provider]  Calcium-Vitamin D (CALTRATE 600 PLUS-VIT D PO) Take 1 tablet by mouth daily.    [provider]  cephALEXin (KEFLEX) 500 MG capsule Take 1 capsule (500 mg total) by mouth 4 (four) times  daily. 05/09/17   Evalee Jefferson, PA-C  diphenhydrAMINE (BENADRYL) 25 MG tablet Take 50 mg by mouth at bedtime as needed for sleep.    [provider]  FLUoxetine (PROZAC) 20 MG capsule Take 60 mg by mouth daily.  06/23/11   [provider]  fluticasone (FLONASE) 50 MCG/ACT nasal spray Place 2 sprays into both nostrils daily.     [provider]  Fluticasone-Salmeterol (ADVAIR DISKUS) 100-50 MCG/DOSE AEPB Inhale 1 puff into the lungs twice daily.  NEEDS APPT FOR REFILLS Patient taking differently: Inhale 1 puff into the lungs 2 (two) times daily. Inhale 1 puff into the lungs twice daily.  NEEDS APPT FOR REFILLS 03/28/17   Mannam, Hart Robinsons, MD  loratadine (CLARITIN) 10 MG tablet Take 10 mg by mouth daily.      [provider]  losartan-hydrochlorothiazide (HYZAAR) 100-25 MG per tablet Take 1 tablet by mouth daily.    [provider]  ondansetron (ZOFRAN ODT) 4 MG disintegrating tablet Take 1 tablet (4 mg total) by mouth every 8 (eight) hours as needed for nausea or vomiting. 05/08/17   Horton, Barbette Hair, MD  pantoprazole (PROTONIX) 40 MG tablet TAKE 1 TABLET BY MOUTH TWICE DAILY Patient taking differently: TAKE 1 TABLET BY MOUTH DAILY 30 MINUTES BEFORE BREAKFAST 12/14/15   Mannam, Praveen, MD  promethazine (PHENERGAN) 25 MG tablet Take 0.5-1 tablets (12.5-25 mg total) by mouth every 6 (six) hours as needed for nausea or vomiting. 05/09/17   Idol, Almyra Free, PA-C  simvastatin (ZOCOR) 10 MG tablet Take 10 mg by mouth at bedtime.     [provider]  traMADol (ULTRAM) 50 MG tablet Take 1 tablet (50 mg total) by mouth every 6 (six) hours as needed. 08/15/17   Milton Ferguson, MD  Vitamin D, Ergocalciferol, (DRISDOL) 50000 units CAPS capsule TAKE 1 CAPSULE BY MOUTH ONCE A WEEK 10/30/16   [place], Kelby Fam, MD    Family History Family History  Problem Relation Age of Onset  . Cancer Brother 84  . Coronary artery disease Brother   . Diabetes Sister   . Heart  disease Father   . Heart disease Brother   . Anesthesia problems Neg Hx   . Hypotension Neg Hx   . Pseudochol deficiency Neg Hx   . Malignant hyperthermia Neg Hx   . Colon cancer Neg Hx     Social History Social History  Substance Use Topics  . Smoking status: Never Smoker  . Smokeless tobacco: Never Used  . Alcohol use No     Allergies   Bactrim [sulfamethoxazole-trimethoprim]   Review of Systems Review of Systems  Constitutional: Negative for appetite change and fatigue.  HENT: Negative for congestion, ear discharge and sinus pressure.   Eyes: Negative for discharge.  Respiratory: Negative for cough.   Cardiovascular:  Negative for chest pain.  Gastrointestinal: Negative for abdominal pain and diarrhea.  Genitourinary: Negative for frequency and hematuria.  Musculoskeletal: Negative for back pain.       Pain in both buttocks  Skin: Negative for rash.  Neurological: Negative for seizures and headaches.  Psychiatric/Behavioral: Negative for hallucinations.     Physical Exam Updated Vital Signs BP (!) 154/54 (BP Location: Left Arm)   Pulse 97   Temp 98.4 F (36.9 C) (Oral)   Resp 18   Ht 5\' 3"  (1.6 m)   Wt 103 kg (227 lb)   SpO2 95%   BMI 40.21 kg/m   Physical Exam  Constitutional: She is oriented to person, place, and time. She appears well-developed.  HENT:  Head: Normocephalic.  Eyes: Conjunctivae and EOM are normal. No scleral icterus.  Neck: Neck supple. No thyromegaly present.  Cardiovascular: Normal rate and regular rhythm.  Exam reveals no gallop and no friction rub.   No murmur heard. Pulmonary/Chest: No stridor. She has no wheezes. She has no rales. She exhibits no tenderness.  Abdominal: She exhibits no distension. There is no tenderness. There is no rebound.  Musculoskeletal: Normal range of motion. She exhibits no edema.  Patient has tenderness to both buttocks  Lymphadenopathy:    She has no cervical adenopathy.  Neurological: She is  oriented to person, place, and time. She exhibits normal muscle tone. Coordination normal.  Skin: No rash noted. No erythema.  Psychiatric: She has a normal mood and affect. Her behavior is normal.     ED Treatments / Results  Labs (all labs ordered are listed, but only abnormal results are displayed) Labs Reviewed - No data to display  EKG  EKG Interpretation None       Radiology Ct Pelvis Wo Contrast  Result Date: 08/15/2017 CLINICAL DATA:  Fall.  Bilateral hip pain. EXAM: CT PELVIS WITHOUT CONTRAST TECHNIQUE: Multidetector CT imaging of the pelvis was performed following the standard protocol without intravenous contrast. COMPARISON:  Pelvis x-rays dated August 03, 2017. CT abdomen and pelvis dated May 28, 2011. FINDINGS: Urinary Tract:  No abnormality visualized. Bowel: Unremarkable visualized pelvic bowel loops. Sigmoid diverticulosis. Vascular/Lymphatic: No pathologically enlarged lymph nodes. Mild atherosclerotic vascular calcification. Reproductive:  The uterus is surgically absent.  No adnexal mass. Other:  No free fluid in the pelvis. Musculoskeletal: No acute fracture. 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 Severe degenerative changes of the lower lumbar spine with unchanged 10 mm anterolisthesis of L4 on L5. IMPRESSION: 1. No acute fracture. 2. 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 3. 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 Electronically Signed   By: Titus Dubin M.D.   On: 08/15/2017 09:16    Procedures Procedures (including critical care time)  Medications Ordered in ED Medications  oxyCODONE-acetaminophen (PERCOCET/ROXICET) 5-325 MG per tablet 1 tablet (1 tablet Oral Given 08/15/17 0839)     Initial Impression / Assessment and Plan / ED Course  I have  reviewed the triage vital signs and the nursing notes.  Pertinent labs & imaging results that were available during my care of the patient were reviewed by me and considered in my medical decision making (see chart for details).     X-rays unremarkable except for degenerative changes in lumbar spine. Patient will be given Ultram and will follow-up with her PCP  Final Clinical  Impressions(s) / ED Diagnoses   Final diagnoses:  Fall, initial encounter    New Prescriptions New Prescriptions   TRAMADOL (ULTRAM) 50 MG TABLET    Take 1 tablet (50 mg total) by mouth every 6 (six) hours as needed.     Milton Ferguson, MD 08/15/17 1029

## 2017-08-17 ENCOUNTER — Ambulatory Visit (HOSPITAL_COMMUNITY): Payer: Medicare HMO

## 2017-08-24 ENCOUNTER — Ambulatory Visit (HOSPITAL_COMMUNITY)
Admission: RE | Admit: 2017-08-24 | Discharge: 2017-08-24 | Disposition: A | Discharge status: Home / Self Care | Payer: Medicare HMO | Source: Ambulatory Visit | Attending: Internal Medicine | Admitting: Internal Medicine

## 2017-08-24 ENCOUNTER — Other Ambulatory Visit (HOSPITAL_COMMUNITY): Payer: Medicare HMO

## 2017-08-24 DIAGNOSIS — R296 Repeated falls: Secondary | ICD-10-CM | POA: Insufficient documentation

## 2017-08-24 DIAGNOSIS — M2548 Effusion, other site: Secondary | ICD-10-CM | POA: Diagnosis not present

## 2017-08-24 DIAGNOSIS — Z9842 Cataract extraction status, left eye: Secondary | ICD-10-CM | POA: Diagnosis not present

## 2017-08-24 DIAGNOSIS — Z9841 Cataract extraction status, right eye: Secondary | ICD-10-CM | POA: Diagnosis not present

## 2017-08-24 DIAGNOSIS — R2689 Other abnormalities of gait and mobility: Secondary | ICD-10-CM

## 2017-08-25 ENCOUNTER — Other Ambulatory Visit (HOSPITAL_COMMUNITY): Payer: Medicare HMO

## 2017-08-25 ENCOUNTER — Ambulatory Visit (HOSPITAL_COMMUNITY): Payer: Medicare HMO | Admitting: Adult Health

## 2017-08-31 NOTE — Progress Notes (Signed)
[place], 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  ER 100%, PR 100%, Ki-67 18%, HER2 NEGATIVE      03/18/2015 Procedure    Breast, lumpectomy, right breast mass by Dr. Arnoldo Morale       03/18/2015 Pathology Results    - INVASIVE DUCTAL CARCINOMA, NOTTINGHAM COMBINED HISTOLOGIC GRADE II, 0.9 CM. - LOW GRADE DUCTAL CARCINOMA IN SITU. - RESECTION MARGINS, NEGATIVE FOR ATYPIA OR MALIGNANCY.      03/31/2015 Initial Diagnosis    Breast cancer      04/02/2015 Oncotype testing    Recurrence score of 4, low-risk.      04/30/2015 - 06/05/2015 Radiation Therapy    Right breast 50 Gy at 2 Gy/fraction x 25 fractions.  Dr. Pablo Ledger      06/08/2015 Imaging    DEXA-  BMD as determined from Femur Neck Right is 0.843 g/cm2 with a T-Score of -1.4. This patient is considered osteopenic according to [facility]) criteria.      06/10/2015 -  Anti-estrogen oral therapy    Arimidex        HISTORY OF PRESENT ILLNESS:  (From Dr. Donald Pore last note on 11/23/16)        INTERVAL HISTORY:  Ms. Forsee is 76 y.o. female returns for routine follow-up for Stage IA right breast cancer.   Overall, she tells me she has been "doing pretty good. I have some stuff going on that isn't related to my breast cancer that I'm dealing with."  She is referring to bilateral leg peripheral neuropathy, with right lateral leg being worse than left.  Reports (R) hip pain, which has been a problem since she fell ~1  month ago.  She has been taking Tylenol, which has not been helpful. States that she had "x-rays and MRIs and they said everything was fine." She has been recovering from the fall since that time. She has a cane and walker, but she doesn't use them.  She is seen in a wheelchair; she's here today with her daughter.    When she fell, she is unsure if she lost consciousness.  There was another incident where she nearly fell because she lost her footing. She feels like her legs are weak at times.   Continues on Arimidex with good tolerance. Prior to her fall, she had occasional arthralgias, but they were not severe. She tells me that she's had arthritis "all over" for awhile.  Denies any hot flashes or vaginal dryness.  Denies any dysuria or hematuria; she does have some urge incontinence at times.  Denies any blood in her stools or dark/tarry stools.       REVIEW OF SYSTEMS:  Review of Systems  Constitutional: Positive for fatigue. Negative for chills and fever.  HENT:  Negative.  Negative for lump/mass and nosebleeds.   Eyes: Negative.   Respiratory: Negative.  Negative for cough and shortness of breath.   Cardiovascular: Negative.  Negative for chest pain and leg swelling.  Gastrointestinal:  Negative.  Negative for abdominal pain, blood in stool, constipation, diarrhea, nausea and vomiting.  Endocrine: Negative.   Genitourinary: Negative.  Negative for dysuria and hematuria.   Musculoskeletal: Positive for arthralgias and myalgias.  Skin: Negative.  Negative for rash.  Neurological: Positive for dizziness, extremity weakness and numbness. Negative for headaches.  Hematological: Negative.  Negative for adenopathy. Does not bruise/bleed easily.  Psychiatric/Behavioral: Negative.  Negative for depression and sleep disturbance. The patient is not nervous/anxious.      PAST MEDICAL/SURGICAL HISTORY:  Past Medical History:  Diagnosis Date  . Breast cancer ([facility]) 03/21/15   right  . Chronic  fatigue   . Colitis, ischemic ([place]) 08/2007, 05/2011   Last colonoscopy/bx by Dr Rourk->(08/31/07) descending colon  . Depression   . Diverticulosis   . Enlarged thyroid   . GERD (gastroesophageal reflux disease)   . HTN (hypertension)   . OA (osteoarthritis)   . Obesity   . PONV (postoperative nausea and vomiting)   . Ptosis of eyelid, left   . Sleep apnea    cpap  . Spinal stenosis   . Tubular adenoma of colon 08/2007   Past Surgical History:  Procedure Laterality Date  . ABDOMINAL HYSTERECTOMY     partial  . BREAST CYST EXCISION     benign, left x2, right x1  . CATARACT EXTRACTION W/PHACO  12/01/2011   Procedure: CATARACT EXTRACTION PHACO AND INTRAOCULAR LENS PLACEMENT (IOC);  Surgeon: Tonny Branch;  Location: [facility];  Service: Ophthalmology;  Laterality: Right;  CDE=14.87  . CATARACT EXTRACTION W/PHACO  02/06/2012   Procedure: CATARACT EXTRACTION PHACO AND INTRAOCULAR LENS PLACEMENT (IOC);  Surgeon: Tonny Branch, MD;  Location: [facility];  Service: Ophthalmology;  Laterality: Left;  CDE 13.00  . COLONOSCOPY  Sept 2008   Last colonoscopy/bx by Dr Rourk->(08/31/07) descending colon TUBULAR ADENOMA  . COLONOSCOPY  10/25/2012   Procedure: COLONOSCOPY;  Surgeon: Daneil Dolin, MD;  Location: [facility];  Service: Endoscopy;  Laterality: N/A;  10:15-changed to 10:30 Darius Bump notified  . PARTIAL MASTECTOMY WITH NEEDLE LOCALIZATION AND AXILLARY SENTINEL LYMPH NODE BX Right 03/18/2015   Procedure: PARTIAL MASTECTOMY AFTER NEEDLE LOCALIZATION AND AXILLARY SENTINEL LYMPH NODE BX;  Surgeon: Aviva Signs Md, MD;  Location: [facility];  Service: General;  Laterality: Right;  . PTOSIS REPAIR Left 05/11/2017   Procedure: INTERNAL PTOSIS REPAIR OF LEFT EYELID;  Surgeon: Clista Bernhardt, MD;  Location: [facility];  Service: Ophthalmology;  Laterality: Left;  . TUBAL LIGATION  1970   hysterectomy     SOCIAL HISTORY:  Social History   Social History  . Marital status: Widowed    Spouse name: N/A  . Number  of children: 2  . Years of education: N/A   Occupational History  . Lab Tech-retired    Social History Main Topics  . Smoking status: Never Smoker  . Smokeless tobacco: Never Used  . Alcohol use No  . Drug use: No  . Sexual activity: No   Other Topics Concern  . Not on file   Social History Narrative  . No narrative on file    FAMILY HISTORY:  Family History  Problem Relation Age of Onset  . Cancer Brother 70  . Coronary artery disease Brother   . Diabetes Sister   . Heart disease Father   . Heart disease Brother   . Anesthesia problems Neg Hx   . Hypotension Neg Hx   . Pseudochol deficiency Neg Hx   . Malignant hyperthermia Neg  Hx   . Colon cancer Neg Hx     CURRENT MEDICATIONS:  Outpatient Encounter Prescriptions as of 09/01/2017  Medication Sig Note  . acetaminophen (TYLENOL ARTHRITIS PAIN) 650 MG CR tablet Take 1,300 mg by mouth 2 (two) times daily.    Marland Kitchen amLODipine (NORVASC) 10 MG tablet Take 10 mg by mouth every evening.    Marland Kitchen anastrozole (ARIMIDEX) 1 MG tablet TAKE 1 TABLET EVERY DAY   . Artificial Tear Solution (SOOTHE XP OP) Place 1 drop into both eyes 2 (two) times daily.   Marland Kitchen aspirin EC 81 MG tablet Take 81 mg by mouth daily.   05/04/2017: On hold due to upcoming procedure  . buPROPion (WELLBUTRIN SR) 150 MG 12 hr tablet Take 150 mg by mouth 2 (two) times daily.    . Calcium-Vitamin D (CALTRATE 600 PLUS-VIT D PO) Take 1 tablet by mouth daily.   Marland Kitchen FLUoxetine (PROZAC) 20 MG capsule Take 60 mg by mouth daily.    . fluticasone (FLONASE) 50 MCG/ACT nasal spray Place 2 sprays into both nostrils daily.    Marland Kitchen loratadine (CLARITIN) 10 MG tablet Take 10 mg by mouth daily.     Marland Kitchen losartan-hydrochlorothiazide (HYZAAR) 100-25 MG per tablet Take 1 tablet by mouth daily.   . pantoprazole (PROTONIX) 40 MG tablet TAKE 1 TABLET BY MOUTH TWICE DAILY (Patient taking differently: TAKE 1 TABLET BY MOUTH DAILY 30 MINUTES BEFORE BREAKFAST)   . promethazine (PHENERGAN) 25 MG tablet Take  0.5-1 tablets (12.5-25 mg total) by mouth every 6 (six) hours as needed for nausea or vomiting.   . simvastatin (ZOCOR) 10 MG tablet Take 10 mg by mouth at bedtime.    . [DISCONTINUED] cephALEXin (KEFLEX) 500 MG capsule Take 1 capsule (500 mg total) by mouth 4 (four) times daily.   . [DISCONTINUED] diphenhydrAMINE (BENADRYL) 25 MG tablet Take 50 mg by mouth at bedtime as needed for sleep.   . [DISCONTINUED] Fluticasone-Salmeterol (ADVAIR DISKUS) 100-50 MCG/DOSE AEPB Inhale 1 puff into the lungs twice daily.  NEEDS APPT FOR REFILLS (Patient taking differently: Inhale 1 puff into the lungs 2 (two) times daily. Inhale 1 puff into the lungs twice daily.  NEEDS APPT FOR REFILLS)   . [DISCONTINUED] ondansetron (ZOFRAN ODT) 4 MG disintegrating tablet Take 1 tablet (4 mg total) by mouth every 8 (eight) hours as needed for nausea or vomiting.   . [DISCONTINUED] traMADol (ULTRAM) 50 MG tablet Take 1 tablet (50 mg total) by mouth every 6 (six) hours as needed.   . [DISCONTINUED] Vitamin D, Ergocalciferol, (DRISDOL) 50000 units CAPS capsule TAKE 1 CAPSULE BY MOUTH ONCE A WEEK    No facility-administered encounter medications on file as of 09/01/2017.     ALLERGIES:  Allergies  Allergen Reactions  . Bactrim [Sulfamethoxazole-Trimethoprim] Nausea Only     PHYSICAL EXAM:  ECOG Performance status: 1 - 2 - Symptomatic; may require occasional assistance.    Vitals:   09/01/17 0846  BP: (!) 152/80  Pulse: 84  Resp: 16  SpO2: 97%   Filed Weights   09/01/17 0846  Weight: 222 lb 4.8 oz (100.8 kg)    Physical Exam  Constitutional: She is oriented to person, place, and time and well-developed, well-nourished, and in no distress.  -Exam done with patient seated in wheelchair   HENT:  Head: Normocephalic.  Mouth/Throat: Oropharynx is clear and moist. No oropharyngeal exudate.  Eyes: Pupils are equal, round, and reactive to light. Conjunctivae are normal. No scleral icterus.  Neck: Normal range of  motion. Neck supple.  Cardiovascular: Normal rate and regular rhythm.   Pulmonary/Chest: Effort normal and breath sounds normal. No respiratory distress.    Abdominal: Soft. Bowel sounds are normal. There is no tenderness.  Musculoskeletal: Normal range of motion. She exhibits no edema.  Lymphadenopathy:    She has no cervical adenopathy.  Neurological: She is alert and oriented to person, place, and time. No cranial nerve deficit.  Skin: Skin is warm and dry. No rash noted.  Psychiatric: Mood, memory, affect and judgment normal.  Nursing note and vitals reviewed.    LABORATORY DATA:  I have reviewed the labs as listed.  CBC    Component Value Date/Time   WBC 4.4 09/01/2017 0801   RBC 3.89 09/01/2017 0801   HGB 12.8 09/01/2017 0801   HCT 37.5 09/01/2017 0801   PLT 238 09/01/2017 0801   MCV 96.4 09/01/2017 0801   MCH 32.9 09/01/2017 0801   MCHC 34.1 09/01/2017 0801   RDW 12.8 09/01/2017 0801   LYMPHSABS 1.5 09/01/2017 0801   MONOABS 0.5 09/01/2017 0801   EOSABS 0.2 09/01/2017 0801   BASOSABS 0.0 09/01/2017 0801   CMP Latest Ref Rng & Units 09/01/2017 07/31/2017 05/09/2017  Glucose 65 - 99 mg/dL 95 87 95  BUN 6 - 20 mg/dL 16 17 10   Creatinine 0.44 - 1.00 mg/dL 0.61 0.70 0.78  Sodium 135 - 145 mmol/L 138 140 136  Potassium 3.5 - 5.1 mmol/L 3.8 4.2 3.6  Chloride 101 - 111 mmol/L 103 105 102  CO2 22 - 32 mmol/L 27 26 25   Calcium 8.9 - 10.3 mg/dL 9.1 9.2 9.0  Total Protein 6.5 - 8.1 g/dL 6.9 7.0 7.3  Total Bilirubin 0.3 - 1.2 mg/dL 0.7 0.7 0.6  Alkaline Phos 38 - 126 U/L 54 59 55  AST 15 - 41 U/L 23 24 39  ALT 14 - 54 U/L 17 19 30     PENDING LABS:    DIAGNOSTIC IMAGING:  DEXA scan: 06/12/17 EXAM: DUAL X-RAY ABSORPTIOMETRY (DXA) FOR BONE MINERAL DENSITY  IMPRESSION: Ordering Physician:  Dr. Holley Bouche,  Your patient Trystyn Sitts completed a BMD test on 06/12/2017 using the [place] (software version: 14.10) manufactured by [employer]. The following summarizes the results of our evaluation.  PATIENT BIOGRAPHICAL: Name: KILAH, DRAHOS Patient ID: [id number] Birth Date: October 04, 1941 Height: 63.0 in. Gender: Female Exam Date: 06/12/2017 Weight: 231.0 lbs. Indications: Breast Ca, Caucasian, Follow up Osteopenia, Height Loss, History of Fracture (Adult), Hx Breast Ca, Partial Hysterectomy, Post Menopausal Fractures: Foot Treatments: Anastrozole, Asprin, Calcium, Vitamin D  DENSITOMETRY RESULTS: Site         Region     Measured Date Measured Age WHO Classification Young Adult T-score BMD         %Change vs. Previous Significant Change (*)  DualFemur Total Left 06/12/2017 77.0 Osteopenia -1.1 0.874 g/cm2 -0.8% - DualFemur Total Left 06/08/2015 75.0 Normal -1.0 0.881 g/cm2 - -  Left Forearm Radius 33% 06/12/2017 77.0 Normal 1.5 0.821 g/cm2 3.3% - Left Forearm Radius 33% 06/08/2015 75.0 Normal 1.1 0.794 g/cm2 - -  ASSESSMENT: BMD as determined from Femur Total Left is 0.874 g/cm2 with a T-Score of -1.1.  This patient is considered OSTEOPENIC according to [facility]) criteria.  Compared with the prior study on 06/08/2015, the BMD of the lt. total hip/lt. forearm show no statistically significant change.   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  s/p lumpectomy with Dr. Arnoldo Morale. Oncotype DX score 4-low risk suggesting no benefit from adjuvant chemotherapy. Completed adjuvant radiation therapy with Dr. Pablo Ledger on 06/05/15. Started anti-estrogen therapy with Anastrozole in 05/2015.  -Continue Arimidex likely for 5-10 years. Discussed Breast Cancer Index (BCI) testing to predict benefit from extension of anti-estrogen therapy; she is interested in taking anastrozole "as long as I need to take it to help  me."  Will order BCI today and we can discuss results at next visit.    -Last mammogram 02/14/17 and was negative; next mammo due in 01/2018; orders placed today.  -Clinical breast exam performed today and benign.  -Return to cancer center in 6 months for continued follow-up.   Hip pain and recent fall:  -Maintain neurology follow-up as recommended by PCP.  Defer to their management.  -From our standpoint, it would be safe for her to take short-term NSAIDs to see if her pain improves. Her kidney function/EGFR are excellent. She does have a history of colitis, but remains on PPI.  Encouraged her to talk to her PCP about a trial of NSAIDs vs other pain mangement options as she continues to recover.  -Stressed the importance of falls prevention and safety, particularly since she lives at home alone. She has a "Life Alert" emergency alert necklace that she wears consistently. Strongly recommended that she use assistive devices to prevent further falls.   Bone health:  -Last DEXA scan 05/2017 showed osteopenia with T-score -1.1.   -Recommended calcium/vitamin D supplementation with OTC supplements. She previously had vitamin D deficiency that corrected with prescription-strength vitamin D. She can continue with OTC supplementation for now.  -Encouraged her to use caution with weight-bearing exercises given increased fall risk.  -Continue Prolia injections every 6 months.  Oncology Flowsheet 07/31/2017  denosumab (PROLIA) [place] 60 mg        Dispo:  -Annual mammogram due in 01/2018; orders placed today.  -Return to cancer center for follow-up in 6 months.  -Continue Prolia every 6 months.     All questions were answered to patient's stated satisfaction. Encouraged patient to call with any new concerns or questions before her next visit to the cancer center and we can certain see her sooner, if needed.      Orders placed this encounter:  Orders Placed This Encounter  Procedures  . MM DIAG BREAST  TOMO BILATERAL      Mike Craze, NP [place] [phone]

## 2017-09-01 ENCOUNTER — Encounter (HOSPITAL_COMMUNITY): Payer: Medicare HMO | Attending: Adult Health

## 2017-09-01 ENCOUNTER — Encounter (HOSPITAL_COMMUNITY): Payer: Self-pay | Admitting: Adult Health

## 2017-09-01 ENCOUNTER — Encounter (HOSPITAL_BASED_OUTPATIENT_CLINIC_OR_DEPARTMENT_OTHER): Payer: Medicare HMO | Admitting: Adult Health

## 2017-09-01 VITALS — BP 152/80 | HR 84 | Resp 16 | Ht 63.0 in | Wt 222.3 lb

## 2017-09-01 DIAGNOSIS — M8580 Other specified disorders of bone density and structure, unspecified site: Secondary | ICD-10-CM | POA: Diagnosis not present

## 2017-09-01 DIAGNOSIS — C50411 Malignant neoplasm of upper-outer quadrant of right female breast: Secondary | ICD-10-CM

## 2017-09-01 DIAGNOSIS — G4730 Sleep apnea, unspecified: Secondary | ICD-10-CM | POA: Insufficient documentation

## 2017-09-01 DIAGNOSIS — Z170 Estrogen receptor positive status [ER+]: Secondary | ICD-10-CM

## 2017-09-01 DIAGNOSIS — K219 Gastro-esophageal reflux disease without esophagitis: Secondary | ICD-10-CM | POA: Diagnosis not present

## 2017-09-01 DIAGNOSIS — R5382 Chronic fatigue, unspecified: Secondary | ICD-10-CM | POA: Diagnosis not present

## 2017-09-01 DIAGNOSIS — Z7982 Long term (current) use of aspirin: Secondary | ICD-10-CM | POA: Insufficient documentation

## 2017-09-01 DIAGNOSIS — M1990 Unspecified osteoarthritis, unspecified site: Secondary | ICD-10-CM | POA: Insufficient documentation

## 2017-09-01 DIAGNOSIS — E669 Obesity, unspecified: Secondary | ICD-10-CM | POA: Diagnosis not present

## 2017-09-01 DIAGNOSIS — C50911 Malignant neoplasm of unspecified site of right female breast: Secondary | ICD-10-CM | POA: Insufficient documentation

## 2017-09-01 DIAGNOSIS — I10 Essential (primary) hypertension: Secondary | ICD-10-CM | POA: Insufficient documentation

## 2017-09-01 DIAGNOSIS — H02402 Unspecified ptosis of left eyelid: Secondary | ICD-10-CM | POA: Diagnosis not present

## 2017-09-01 DIAGNOSIS — Z882 Allergy status to sulfonamides status: Secondary | ICD-10-CM | POA: Diagnosis not present

## 2017-09-01 DIAGNOSIS — Z79899 Other long term (current) drug therapy: Secondary | ICD-10-CM | POA: Diagnosis not present

## 2017-09-01 DIAGNOSIS — Z79811 Long term (current) use of aromatase inhibitors: Secondary | ICD-10-CM

## 2017-09-01 DIAGNOSIS — Z923 Personal history of irradiation: Secondary | ICD-10-CM | POA: Insufficient documentation

## 2017-09-01 LAB — COMPREHENSIVE METABOLIC PANEL
ALK PHOS: 54 U/L (ref 38–126)
ALT: 17 U/L (ref 14–54)
AST: 23 U/L (ref 15–41)
Albumin: 4.2 g/dL (ref 3.5–5.0)
Anion gap: 8 (ref 5–15)
BUN: 16 mg/dL (ref 6–20)
CALCIUM: 9.1 mg/dL (ref 8.9–10.3)
CO2: 27 mmol/L (ref 22–32)
CREATININE: 0.61 mg/dL (ref 0.44–1.00)
Chloride: 103 mmol/L (ref 101–111)
GFR calc Af Amer: >60 mL/min (ref >60)
Glucose, Bld: 95 mg/dL (ref 65–99)
Potassium: 3.8 mmol/L (ref 3.5–5.1)
Sodium: 138 mmol/L (ref 135–145)
Total Bilirubin: 0.7 mg/dL (ref 0.3–1.2)
Total Protein: 6.9 g/dL (ref 6.5–8.1)

## 2017-09-01 LAB — CBC WITH DIFFERENTIAL/PLATELET
Basophils Absolute: 0 10*3/uL (ref 0.0–0.1)
Basophils Relative: 0 %
EOS PCT: 5 %
Eosinophils Absolute: 0.2 10*3/uL (ref 0.0–0.7)
HCT: 37.5 % (ref 36.0–46.0)
HEMOGLOBIN: 12.8 g/dL (ref 12.0–15.0)
LYMPHS ABS: 1.5 10*3/uL (ref 0.7–4.0)
LYMPHS PCT: 33 %
MCH: 32.9 pg (ref 26.0–34.0)
MCHC: 34.1 g/dL (ref 30.0–36.0)
MCV: 96.4 fL (ref 78.0–100.0)
Monocytes Absolute: 0.5 10*3/uL (ref 0.1–1.0)
Monocytes Relative: 11 %
NEUTROS PCT: 51 %
Neutro Abs: 2.2 10*3/uL (ref 1.7–7.7)
Platelets: 238 10*3/uL (ref 150–400)
RBC: 3.89 MIL/uL (ref 3.87–5.11)
RDW: 12.8 % (ref 11.5–15.5)
WBC: 4.4 10*3/uL (ref 4.0–10.5)

## 2017-09-01 NOTE — Patient Instructions (Addendum)
[place] at [facility] Discharge Instructions  RECOMMENDATIONS MADE BY THE CONSULTANT AND ANY TEST RESULTS WILL BE SENT TO YOUR REFERRING PHYSICIAN.  You were seen today by Mike Craze NP. Your mammogram is due in February 2019. Return in 6 months for follow up.    Thank you for choosing [facility] [place] at [facility] to provide your oncology and hematology care.  To afford each patient quality time with our provider, please arrive at least 15 minutes before your scheduled appointment time.    If you have a lab appointment with the [facility] please come in thru the  Main Entrance and check in at the main information desk  You need to re-schedule your appointment should you arrive 10 or more minutes late.  We strive to give you quality time with our providers, and arriving late affects you and other patients whose appointments are after yours.  Also, if you no show three or more times for appointments you may be dismissed from the clinic at the providers discretion.     Again, thank you for choosing [facility].  Our hope is that these requests will decrease the amount of time that you wait before being seen by our physicians.       _____________________________________________________________  Should you have questions after your visit to [facility], please contact our office at [phone] between the hours of 8:30 a.m. and 4:30 p.m.  Voicemails left after 4:30 p.m. will not be returned until the following business day.  For prescription refill requests, have your pharmacy contact our office.       Resources For Cancer Patients and their Caregivers ? American Cancer Society: Can assist with transportation, wigs, general needs, runs Look Good Feel Better.        571-557-4056 ? Cancer Care: Provides financial assistance, online support groups, medication/co-pay assistance.  1-800-813-HOPE  925 010 1898) ? [place] Assists [place] cancer patients and their families through emotional , educational and financial support.  (336)767-7620 ? [place] DSS Where to apply for food stamps, Medicaid and utility assistance. 340-028-4042 ? [facility]: Transportation to medical appointments. 559-300-8609 ? Social Security Administration: May apply for disability if have a Stage IV cancer. (947) 301-7873 684-596-7681 ? [employer], Disability and Transit Services: Assists with nutrition, care and transit needs. [place] Support Programs: @10RELATIVEDAYS @ > Cancer Support Group  2nd Tuesday of the month 1pm-2pm, Journey Room  > Creative Journey  3rd Tuesday of the month 1130am-1pm, Journey Room  > Look Good Feel Better  1st Wednesday of the month 10am-12 noon, Journey Room (Call [facility] to register [phone])

## 2017-09-02 LAB — VITAMIN D 25 HYDROXY (VIT D DEFICIENCY, FRACTURES): VIT D 25 HYDROXY: 32 ng/mL (ref 30.0–100.0)

## 2017-09-04 ENCOUNTER — Encounter (HOSPITAL_COMMUNITY): Payer: Self-pay | Admitting: Emergency Medicine

## 2017-09-04 NOTE — Progress Notes (Signed)
BCI sent.  Fax confirmed.

## 2017-09-06 DIAGNOSIS — M65312 Trigger thumb, left thumb: Secondary | ICD-10-CM | POA: Diagnosis not present

## 2017-09-06 DIAGNOSIS — M180 Bilateral primary osteoarthritis of first carpometacarpal joints: Secondary | ICD-10-CM | POA: Diagnosis not present

## 2017-09-06 DIAGNOSIS — M65331 Trigger finger, right middle finger: Secondary | ICD-10-CM | POA: Diagnosis not present

## 2017-09-07 ENCOUNTER — Other Ambulatory Visit (HOSPITAL_COMMUNITY)
Admission: RE | Admit: 2017-09-07 | Discharge: 2017-09-07 | Disposition: A | Discharge status: Home / Self Care | Payer: Medicare HMO | Source: Ambulatory Visit | Attending: Adult Health | Admitting: Adult Health

## 2017-09-07 DIAGNOSIS — Z79811 Long term (current) use of aromatase inhibitors: Secondary | ICD-10-CM | POA: Insufficient documentation

## 2017-09-07 DIAGNOSIS — C50411 Malignant neoplasm of upper-outer quadrant of right female breast: Secondary | ICD-10-CM | POA: Diagnosis not present

## 2017-09-07 DIAGNOSIS — Z170 Estrogen receptor positive status [ER+]: Secondary | ICD-10-CM | POA: Diagnosis not present

## 2017-09-08 ENCOUNTER — Ambulatory Visit: Payer: Medicare HMO | Admitting: Neurology

## 2017-09-15 DIAGNOSIS — G4733 Obstructive sleep apnea (adult) (pediatric): Secondary | ICD-10-CM | POA: Diagnosis not present

## 2017-09-15 DIAGNOSIS — C50911 Malignant neoplasm of unspecified site of right female breast: Secondary | ICD-10-CM | POA: Diagnosis not present

## 2017-09-18 ENCOUNTER — Encounter (HOSPITAL_COMMUNITY): Payer: Self-pay

## 2017-09-26 ENCOUNTER — Encounter: Payer: Self-pay | Admitting: Pulmonary Disease

## 2017-09-26 ENCOUNTER — Ambulatory Visit (INDEPENDENT_AMBULATORY_CARE_PROVIDER_SITE_OTHER): Payer: Medicare HMO | Admitting: Pulmonary Disease

## 2017-09-26 VITALS — BP 136/76 | HR 88 | Ht 63.0 in | Wt 226.5 lb

## 2017-09-26 DIAGNOSIS — R05 Cough: Secondary | ICD-10-CM

## 2017-09-26 DIAGNOSIS — G4733 Obstructive sleep apnea (adult) (pediatric): Secondary | ICD-10-CM | POA: Diagnosis not present

## 2017-09-26 DIAGNOSIS — R053 Chronic cough: Secondary | ICD-10-CM

## 2017-09-26 NOTE — Patient Instructions (Signed)
I'm glad that the cough is better I reviewed your CPAP machine. It seems to be working well for you Continue using CPAP every day at night Follow-up in one year.

## 2017-09-26 NOTE — Progress Notes (Signed)
Subjective:    Patient ID: Crystal [name], female    DOB: June 30, 1941, 76 y.o.   MRN: [id number] PROBLEM LIST Chronic cough Post nasal drip OSA on CPAP 5-20  HPI Crystal [name] is a 76 year old with past medical history as below. Complaints of chronic cough for several years. This is nonproductive in nature not associated with shortness of breath, wheezing, chest pain, sputum production, hemoptysis. She has significant issues with her sinusitis and postnasal drip. She states that she can feel the drip at the back of her throat with irritation and constant throat clearing. She also has history of GERD and she is on Protonix. The symptoms were exacerbated on a trip to the beach 2 months ago. She saw her primary care physician who gave her a Z pack. She also got a chest x-ray. I do not have the report with me but is reportedly normal as per the patient. She is also on a Flonase nasal spray which was started a month ago. This has apparently not helped with her symptoms. She is on Cozaar for her blood pressure medication. But she is unable to state if her symptoms of cough began when Cozaar was started.  She has history of OSA on CPAP. No history of allergies or exposures. She is a never smoker and does not drink alcohol.   Interim history: Cough has improved since last visit. She continues on CPAP with no issues Download reviewed. It shows good compliance and effectiveness.  DATA: CXR 04/28/16 Stable linear scarring of the lingula compared to 2016. I have reviewed her images personally.  Sleep study 12/19/15 Moderate sleep apnea  PFTs 04/25/16 FVC 3 [105%) FEV1 2.5 to [180%) F/F 84 TLC 190% DLCO 81%. No obstruction, restriction. Minimal diffusion defect.  FENO 05/17/17- 5  Past Medical History:  Diagnosis Date  . Breast cancer ([facility]) 03/21/15   right  . Chronic fatigue   . Colitis, ischemic ([facility]) 08/2007, 05/2011   Last colonoscopy/bx by Dr Rourk->(08/31/07) descending colon  . Depression     . Diverticulosis   . Enlarged thyroid   . GERD (gastroesophageal reflux disease)   . HTN (hypertension)   . OA (osteoarthritis)   . Obesity   . PONV (postoperative nausea and vomiting)   . Ptosis of eyelid, left   . Sleep apnea    cpap  . Spinal stenosis   . Tubular adenoma of colon 08/2007     Current Outpatient Prescriptions:  .  acetaminophen (TYLENOL ARTHRITIS PAIN) 650 MG CR tablet, Take 1,300 mg by mouth 2 (two) times daily. , Disp: , Rfl:  .  amLODipine (NORVASC) 10 MG tablet, Take 10 mg by mouth every evening. , Disp: , Rfl:  .  anastrozole (ARIMIDEX) 1 MG tablet, TAKE 1 TABLET EVERY DAY, Disp: 90 tablet, Rfl: 1 .  Artificial Tear Solution (SOOTHE XP OP), Place 1 drop into both eyes 2 (two) times daily., Disp: , Rfl:  .  aspirin EC 81 MG tablet, Take 81 mg by mouth daily.  , Disp: , Rfl:  .  buPROPion (WELLBUTRIN SR) 150 MG 12 hr tablet, Take 150 mg by mouth 2 (two) times daily. , Disp: , Rfl:  .  Calcium-Vitamin D (CALTRATE 600 PLUS-VIT D PO), Take 1 tablet by mouth daily., Disp: , Rfl:  .  FLUoxetine (PROZAC) 20 MG capsule, Take 60 mg by mouth daily. , Disp: , Rfl:  .  fluticasone (FLONASE) 50 MCG/ACT nasal spray, Place 2 sprays into both nostrils daily. ,  Disp: , Rfl:  .  loratadine (CLARITIN) 10 MG tablet, Take 10 mg by mouth daily.  , Disp: , Rfl:  .  losartan-hydrochlorothiazide (HYZAAR) 100-25 MG per tablet, Take 1 tablet by mouth daily., Disp: , Rfl:  .  pantoprazole (PROTONIX) 40 MG tablet, TAKE 1 TABLET BY MOUTH TWICE DAILY (Patient taking differently: TAKE 1 TABLET BY MOUTH DAILY 30 MINUTES BEFORE BREAKFAST), Disp: 60 tablet, Rfl: 5 .  promethazine (PHENERGAN) 25 MG tablet, Take 0.5-1 tablets (12.5-25 mg total) by mouth every 6 (six) hours as needed for nausea or vomiting., Disp: 30 tablet, Rfl: 0 .  simvastatin (ZOCOR) 10 MG tablet, Take 10 mg by mouth at bedtime. , Disp: , Rfl:   Review of Systems Review of Systems  Constitutional: Negative for fever, chills and  unexpected weight change.  HENT: Positive for postnasal drip. Negative for congestion, dental problem, ear pain, nosebleeds, rhinorrhea, sinus pressure, sneezing, sore throat, trouble swallowing and voice change.  Eyes: Negative for visual disturbance.  Respiratory: Positive for cough. Negative for choking and shortness of breath.  Cardiovascular: Negative for chest pain and leg swelling.  Gastrointestinal: Negative for vomiting, abdominal pain and diarrhea.  Genitourinary: Negative for difficulty urinating.  Musculoskeletal: Negative for arthralgias.  Skin: Negative for rash.  Neurological: Negative for tremors, syncope and headaches.  Hematological: Does not bruise/bleed easily.     Objective:   Physical Exam  Blood pressure 136/76, pulse 88, height 5\' 3"  (1.6 m), weight 102.7 kg (226 lb 8 oz), SpO2 98 %. Gen:      No acute distress HEENT:  EOMI, sclera anicteric Neck:     No masses; no thyromegaly Lungs:    Clear to auscultation bilaterally; normal respiratory effort CV:         Regular rate and rhythm; no murmurs Abd:      + bowel sounds; soft, non-tender; no palpable masses, no distension Ext:    No edema; adequate peripheral perfusion Skin:      Warm and dry; no rash Neuro: alert and oriented x 3 Psych: normal mood and affect    Assessment & Plan:  #1 Upper airway cough syndrome Cough secondary to allergic rhinitis, postnasal drip with contribution of GERD. Suspicion for asthma is low and FENO is normal Not on any long-term inhalers. Continue Flonase and loratadine at night for rhinitis, postnasal drip. Continue Protonix for GERD  #2 OSA. Compliant with CPAP AutoSet. Continue same.  Plan: - Continue Flonase, loratadine, Protonix - Continue CPAP.   Follow-up in one year.  Return in [place] MD [facility] Pager [phone] If no answer or after 3pm call: [phone] 09/26/2017, 2:19 PM

## 2017-09-28 ENCOUNTER — Encounter: Payer: Self-pay | Admitting: Internal Medicine

## 2017-10-13 DIAGNOSIS — Z23 Encounter for immunization: Secondary | ICD-10-CM | POA: Diagnosis not present

## 2017-10-18 ENCOUNTER — Encounter: Payer: Self-pay | Admitting: Neurology

## 2017-10-18 ENCOUNTER — Ambulatory Visit (INDEPENDENT_AMBULATORY_CARE_PROVIDER_SITE_OTHER): Payer: Medicare HMO | Admitting: Neurology

## 2017-10-18 DIAGNOSIS — R269 Unspecified abnormalities of gait and mobility: Secondary | ICD-10-CM | POA: Diagnosis not present

## 2017-10-18 DIAGNOSIS — R413 Other amnesia: Secondary | ICD-10-CM | POA: Diagnosis not present

## 2017-10-18 HISTORY — DX: Unspecified abnormalities of gait and mobility: R26.9

## 2017-10-18 HISTORY — DX: Other amnesia: R41.3

## 2017-10-18 NOTE — Patient Instructions (Signed)
   We will get blood work today and get EMG and NCV study to look at nerve function of the legs.  We will get MRI of the low back.

## 2017-10-18 NOTE — Progress Notes (Signed)
Reason for visit: Gait disturbance, memory disturbance  Referring physician: Dr. Malvin Johns is a 76 y.o. female  History of present illness:  Crystal [name] is a 76 year old right-handed white female with a history of some problems with falling that began about 6 months ago. Crystal [name] has not had any falls for at least 2 months, but she fell 3 times in one week prior to that. She has developed some stooping of posture and she has developed some back pain and some discomfort into Crystal right hip. She has reported numbness below Crystal knee on Crystal right leg. Crystal [name] denies any numbness on Crystal left leg, but she does have some numbness commonly with Crystal hands bilaterally. She denies any neck pain or pain down Crystal arms. She denies any weakness of Crystal arms or Crystal legs. She has not had troubles controlling Crystal bowels or Crystal bladder. There has been some mild memory problems. Crystal [name] lives alone, she is able to keep up with her medications and appointments and do her own finances without difficulty. With driving sometimes she will forget where she is going. She may have difficulty relaying to her family things that happened in Crystal past. Crystal [name] does have sleep apnea, she is on CPAP but does not tolerate it well and has significant insomnia. She oftentimes will have to sleep in a recliner without her CPAP. Crystal [name] does not use a cane or a walker for ambulation. She has a prior history of breast cancer, but she had a lumpectomy and radiation and no chemotherapy. She has had a recent MRI of Crystal brain that shows a mild to moderate level small vessel disease, some of this does involve Crystal brainstem. She is sent to this office for an evaluation.  Past Medical History:  Diagnosis Date  . Breast cancer ([facility]) 03/21/15   right  . Chronic fatigue   . Colitis, ischemic ([facility]) 08/2007, 05/2011   Last colonoscopy/bx by Dr Rourk->(08/31/07) descending colon  . Depression   . Diverticulosis   . Enlarged  thyroid   . GERD (gastroesophageal reflux disease)   . HTN (hypertension)   . OA (osteoarthritis)   . Obesity   . PONV (postoperative nausea and vomiting)   . Ptosis of eyelid, left   . Sleep apnea    cpap  . Spinal stenosis   . Tubular adenoma of colon 08/2007    Past Surgical History:  Procedure Laterality Date  . ABDOMINAL HYSTERECTOMY     partial  . BREAST CYST EXCISION     benign, left x2, right x1  . CATARACT EXTRACTION W/PHACO  12/01/2011   Procedure: CATARACT EXTRACTION PHACO AND INTRAOCULAR LENS PLACEMENT (IOC);  Surgeon: Tonny Branch;  Location: [facility];  Service: Ophthalmology;  Laterality: Right;  CDE=14.87  . CATARACT EXTRACTION W/PHACO  02/06/2012   Procedure: CATARACT EXTRACTION PHACO AND INTRAOCULAR LENS PLACEMENT (IOC);  Surgeon: Tonny Branch, MD;  Location: [facility];  Service: Ophthalmology;  Laterality: Left;  CDE 13.00  . COLONOSCOPY  Sept 2008   Last colonoscopy/bx by Dr Rourk->(08/31/07) descending colon TUBULAR ADENOMA  . COLONOSCOPY  10/25/2012   Procedure: COLONOSCOPY;  Surgeon: Daneil Dolin, MD;  Location: [facility];  Service: Endoscopy;  Laterality: N/A;  10:15-changed to 10:30 Darius Bump notified  . PARTIAL MASTECTOMY WITH NEEDLE LOCALIZATION AND AXILLARY SENTINEL LYMPH NODE BX Right 03/18/2015   Procedure: PARTIAL MASTECTOMY AFTER NEEDLE LOCALIZATION AND AXILLARY SENTINEL LYMPH NODE BX;  Surgeon: Aviva Signs  Md, MD;  Location: [facility];  Service: General;  Laterality: Right;  . PTOSIS REPAIR Left 05/11/2017   Procedure: INTERNAL PTOSIS REPAIR OF LEFT EYELID;  Surgeon: Clista Bernhardt, MD;  Location: [facility];  Service: Ophthalmology;  Laterality: Left;  . TUBAL LIGATION  1970   hysterectomy    Family History  Problem Relation Age of Onset  . Cancer Brother 84  . Coronary artery disease Brother   . Diabetes Sister   . Heart disease Father   . Heart disease Brother   . Anesthesia problems Neg Hx   . Hypotension Neg Hx   . Pseudochol deficiency Neg Hx   .  Malignant hyperthermia Neg Hx   . Colon cancer Neg Hx     Social history:  reports that she has never smoked. She has never used smokeless tobacco. She reports that she does not drink alcohol or use drugs.  Medications:  Prior to Admission medications   Medication Sig Start Date End Date Taking? Authorizing Provider  acetaminophen (TYLENOL ARTHRITIS PAIN) 650 MG CR tablet Take 1,300 mg by mouth 2 (two) times daily.    Yes [provider]  amLODipine (NORVASC) 10 MG tablet Take 10 mg by mouth every evening.  10/02/12  Yes [provider]  anastrozole (ARIMIDEX) 1 MG tablet TAKE 1 TABLET EVERY DAY 08/03/17  Yes Holley Bouche, NP  Artificial Tear Solution (SOOTHE XP OP) Place 1 drop into both eyes 2 (two) times daily.   Yes [provider]  aspirin EC 81 MG tablet Take 81 mg by mouth daily.     Yes [provider]  buPROPion (WELLBUTRIN SR) 150 MG 12 hr tablet Take 150 mg by mouth 2 (two) times daily.  06/02/11  Yes [provider]  Calcium-Vitamin D (CALTRATE 600 PLUS-VIT D PO) Take 1 tablet by mouth daily.   Yes [provider]  FLUoxetine (PROZAC) 20 MG capsule Take 60 mg by mouth daily.  06/23/11  Yes [provider]  fluticasone (FLONASE) 50 MCG/ACT nasal spray Place 2 sprays into both nostrils daily.    Yes [provider]  loratadine (CLARITIN) 10 MG tablet Take 10 mg by mouth daily.     Yes [provider]  losartan-hydrochlorothiazide (HYZAAR) 100-25 MG per tablet Take 1 tablet by mouth daily.   Yes [provider]  pantoprazole (PROTONIX) 40 MG tablet TAKE 1 TABLET BY MOUTH TWICE DAILY [name] taking differently: TAKE 1 TABLET BY MOUTH DAILY 30 MINUTES BEFORE BREAKFAST 12/14/15  Yes Mannam, Praveen, MD  promethazine (PHENERGAN) 25 MG tablet Take 0.5-1 tablets (12.5-25 mg total) by mouth every 6 (six) hours as needed for nausea or vomiting. 05/09/17  Yes Idol, Almyra Free, PA-C  simvastatin (ZOCOR) 10 MG  tablet Take 10 mg by mouth at bedtime.    Yes [provider]      Allergies  Allergen Reactions  . Bactrim [Sulfamethoxazole-Trimethoprim] Nausea Only    ROS:  Out of a complete 14 system review of symptoms, Crystal [name] complains only of Crystal following symptoms, and all other reviewed systems are negative.  Hearing loss Moles Cough Bruising Joint pain, joint swelling, aching muscles Numbness, weakness Depression, not enough sleep, decreased energy, disinterest in activities Insomnia, restless legs  Blood pressure (!) 152/73, pulse 80, height 5\' 3"  (1.6 m), weight 226 lb (102.5 kg).  Physical Exam  General: Crystal [name] is alert and cooperative at Crystal time of Crystal examination. Crystal [name] is moderately obese.  Eyes: Pupils are  equal, round, and reactive to light. Discs are flat bilaterally.  Neck: Crystal neck is supple, no carotid bruits are noted.  Respiratory: Crystal respiratory examination is clear.  Cardiovascular: Crystal cardiovascular examination reveals a regular rate and rhythm, no obvious murmurs or rubs are noted.  Skin: Extremities are without significant edema.  Neurologic Exam  Mental status: Crystal [name] is alert and oriented x 3 at Crystal time of Crystal examination. Crystal [name] has apparent normal recent and remote memory, with an apparently normal attention span and concentration ability. Crystal Mini-Mental Status Examination done today shows a total score 30/30.  Cranial nerves: Facial symmetry is present. There is good sensation of Crystal face to pinprick and soft touch bilaterally. Crystal strength of Crystal facial muscles and Crystal muscles to head turning and shoulder shrug are normal bilaterally. Speech is well enunciated, no aphasia or dysarthria is noted. Extraocular movements are full. Visual fields are full. Crystal tongue is midline, and Crystal [name] has symmetric elevation of Crystal soft palate. No obvious hearing deficits are noted.  Motor: Crystal motor testing reveals 5 over 5  strength of all 4 extremities. Good symmetric motor tone is noted throughout.  Sensory: Sensory testing is intact to pinprick, soft touch, vibration sensation, and position sense on Crystal upper extremities. With Crystal lower extremities there does not appear to be any definite stocking pattern pinprick sensory deficit. Crystal [name] has some mild impairment of vibration and position sense in both feet. No evidence of extinction is noted.  Coordination: Cerebellar testing reveals good finger-nose-finger and heel-to-shin bilaterally.  Gait and station: Gait is associated with a stooped posture, Crystal [name] has a limping type quality to Crystal gait with Crystal right leg, Crystal stance is slightly wide-based. Tandem gait is unsteady. Romberg is negative. No drift is seen.  Reflexes: Deep tendon reflexes are symmetric, but are depressed bilaterally. Toes are downgoing bilaterally.   MRI brain 08/24/17:  IMPRESSION: 1. No acute intracranial abnormality. 2. Moderate chronic small vessel ischemic disease. 3. Small mastoid effusions.  * MRI scan images were reviewed online. I agree with Crystal written report.    Assessment/Plan:  1. Gait disturbance  2. Memory disturbance  Crystal [name] has had some issues with walking over Crystal last 6 months. She is reporting some back pain and some right hip and right leg symptoms. Crystal [name] will undergo nerve conduction studies of both legs, EMG on Crystal right leg. She will have blood work done today. She will undergo MRI of Crystal lumbar spine. Crystal [name] may benefit from physical therapy in Crystal near future. We will follow Crystal memory issues over time. She will follow-up in 4 months.  Jill Alexanders MD 10/18/2017 10:12 AM  [facility] [address] [place], [place] [address]  Phone [phone] Fax [phone]

## 2017-10-19 LAB — RPR: RPR: NONREACTIVE

## 2017-10-19 LAB — SEDIMENTATION RATE: Sed Rate: 11 mm/hr (ref 0–40)

## 2017-10-19 LAB — VITAMIN B12: Vitamin B-12: 240 pg/mL (ref 232–1245)

## 2017-11-01 ENCOUNTER — Ambulatory Visit (INDEPENDENT_AMBULATORY_CARE_PROVIDER_SITE_OTHER): Payer: Medicare HMO | Admitting: Neurology

## 2017-11-01 ENCOUNTER — Encounter: Payer: Self-pay | Admitting: Neurology

## 2017-11-01 ENCOUNTER — Ambulatory Visit: Payer: Self-pay | Admitting: Neurology

## 2017-11-01 DIAGNOSIS — R200 Anesthesia of skin: Secondary | ICD-10-CM

## 2017-11-01 DIAGNOSIS — R269 Unspecified abnormalities of gait and mobility: Secondary | ICD-10-CM

## 2017-11-01 NOTE — Procedures (Signed)
     HISTORY:  Crystal [name] patient with a history of some back and right hip discomfort with occasional discomfort down the right leg that occurs mainly at night.  The patient is being evaluated for possible neuropathy or a lumbosacral radiculopathy.  NERVE CONDUCTION STUDIES:  Nerve conduction studies were performed on both lower extremities.  The distal motor latencies for the peroneal nerves were within normal limits bilaterally with a low motor amplitude on the left, normal on the right.  The distal motor latency for the left posterior tibial nerve was prolonged, normal on the right, there is a low motor amplitude for these nerves bilaterally.  The nerve conduction velocities for the peroneal and posterior tibial nerves were normal bilaterally.  The peroneal sensory latencies were within normal limits bilaterally.  The H reflex latencies were prolonged bilaterally.  EMG STUDIES:  EMG study was performed on the right lower extremity:  The tibialis anterior muscle reveals 2 to 5K motor units with decreased recruitment. 2+  positive waves were seen. The peroneus tertius muscle reveals 2 to 6K motor units with decreased recruitment. 2+ fibrillations and positive waves were seen. The medial gastrocnemius muscle reveals 1 to 4K motor units with full recruitment. No fibrillations or positive waves were seen. The vastus lateralis muscle reveals 2 to 4K motor units with full recruitment. No fibrillations or positive waves were seen. The iliopsoas muscle reveals 2 to 4K motor units with full recruitment. No fibrillations or positive waves were seen. The biceps femoris muscle (long head) reveals 2 to 4K motor units with full recruitment. No fibrillations or positive waves were seen. The lumbosacral paraspinal muscles were tested at 3 levels, and revealed no abnormalities of insertional activity at all 3 levels tested. There was good relaxation.   IMPRESSION:  Nerve conduction studies done on both  lower extremities shows evidence of primarily motor involvement in both lower extremities, left greater than right.  EMG evaluation of the right lower extremity shows isolated acute and chronic denervation in the peroneal nerve distribution, there is no evidence of an overlying L5 radiculopathy.  Jill Alexanders MD 11/01/2017 3:15 PM  [facility] [address] [place], [place] [address]  Phone [phone] Fax [phone]

## 2017-11-01 NOTE — Progress Notes (Addendum)
The patient comes in for EMG nerve conduction study evaluation today.  Nerve conduction studies do not confirm the presence of a peripheral neuropathy, there appears to be some mild dysfunction of the left posterior tibial nerve EMG evaluation of the right lower extremity shows acute and chronic denervation primarily within the peroneal nerve distribution, no clear evidence of an overlying L5 radiculopathy.  MRI of the lumbar spine will be done in the next several days.  Given the EMG and nerve conduction study findings, spinal stenosis does need to be excluded.    [place]    Nerve / Sites Muscle Latency Ref. Amplitude Ref. Rel Amp Segments Distance Velocity Ref. Area    ms ms mV mV %  cm m/s m/s mVms  L Peroneal - EDB     Ankle EDB 4.6 ?6.5 1.8 ?2.0 100 Ankle - EDB 9   4.2     Fib head EDB 12.9  1.2  70.8 Fib head - Ankle 38 46 ?44 3.3     Pop fossa EDB 14.8  1.0  79 Pop fossa - Fib head 10 53 ?44 2.7         Pop fossa - Ankle      R Peroneal - EDB     Ankle EDB 4.5 ?6.5 3.0 ?2.0 100 Ankle - EDB 9   8.1     Fib head EDB 12.8  1.9  65.6 Fib head - Ankle 37 44 ?44 4.7     Pop fossa EDB 14.7  2.0  102 Pop fossa - Fib head 10 53 ?44 4.9         Pop fossa - Ankle      L Tibial - AH     Ankle AH 6.4 ?5.8 1.1 ?4.0 100 Ankle - AH 9   2.9     Pop fossa AH 16.5  0.9  78.2 Pop fossa - Ankle 45 45 ?41 2.5  R Tibial - AH     Ankle AH 4.9 ?5.8 3.5 ?4.0 100 Ankle - AH 9   6.4     Pop fossa AH 15.6  2.1  58.5 Pop fossa - Ankle 44 41 ?41 4.0             SNC    Nerve / Sites Rec. Site Peak Lat Ref.  Amp Ref. Segments Distance    ms ms V V  cm  R Superficial peroneal - Ankle     Lat leg Ankle 3.5 ?4.4 3 ?6 Lat leg - Ankle 14  L Superficial peroneal - Ankle     Lat leg Ankle 2.8 ?4.4 4 ?6 Lat leg - Ankle 14         H Reflex    Nerve H Lat Lat Hmax   ms ms   Left Right Ref. Left Right Ref.  Tibial - [place] 39.0 37.1 ?35.0 47.4 43.9 ?35.0

## 2017-11-01 NOTE — Progress Notes (Signed)
Please refer to EMG and nerve conduction study procedure note. 

## 2017-11-06 ENCOUNTER — Ambulatory Visit
Admission: RE | Admit: 2017-11-06 | Discharge: 2017-11-06 | Disposition: A | Discharge status: Home / Self Care | Payer: Medicare HMO | Source: Ambulatory Visit | Attending: Neurology | Admitting: Neurology

## 2017-11-06 DIAGNOSIS — R269 Unspecified abnormalities of gait and mobility: Secondary | ICD-10-CM

## 2017-11-07 ENCOUNTER — Encounter: Payer: Self-pay | Admitting: Neurology

## 2017-11-07 ENCOUNTER — Telehealth: Payer: Self-pay | Admitting: Neurology

## 2017-11-07 DIAGNOSIS — M48061 Spinal stenosis, lumbar region without neurogenic claudication: Secondary | ICD-10-CM

## 2017-11-07 HISTORY — DX: Spinal stenosis, lumbar region without neurogenic claudication: M48.061

## 2017-11-07 NOTE — Telephone Encounter (Signed)
I called the patient.  MRI of the lumbar spine shows very severe spinal stenosis at the L4-5 level, moderate the L3-4 level.  I have recommended a surgical referral, the patient is not excited about the prospects for surgery, but she is willing to sit down with a surgeon and discuss what the surgery will be all about and what the recovery time looks like.  I will get a referral to [place] [facility].   MRI lumbar 11/06/17:  IMPRESSION:  This MRI of the lumbar spine showed the following, 1.   At L3-L4, there is moderate spinal stenosis due to disc protrusion, facet hypertrophy and severe ligamenta flava hypertrophy.   There is severe right foraminal narrowing and moderately severe bilateral lateral recess stenosis. There is probable right L3 and possible bilateral L4 nerve root compression. 2.   At L4-L5, there is very severe spinal stenosis due to disc herniation, severe facet hypertrophy and severe ligamentum flavum hypertrophy. There is moderately severe bilateral foraminal narrowing and very severe right lateral recess stenosis. There is probable right L5 nerve root compression and there could also be compression of other nerve roots on the right. There is a possible L4 nerve root compression 3.   There are milder degenerative changes at L1-L2, L2-L3 and L5-S1 do not lead to nerve root compression.

## 2017-11-20 DIAGNOSIS — I10 Essential (primary) hypertension: Secondary | ICD-10-CM | POA: Diagnosis not present

## 2017-11-20 DIAGNOSIS — M4807 Spinal stenosis, lumbosacral region: Secondary | ICD-10-CM | POA: Diagnosis not present

## 2017-11-27 ENCOUNTER — Encounter: Payer: Self-pay | Admitting: Neurology

## 2017-11-30 DIAGNOSIS — M549 Dorsalgia, unspecified: Secondary | ICD-10-CM | POA: Diagnosis not present

## 2017-11-30 DIAGNOSIS — M4727 Other spondylosis with radiculopathy, lumbosacral region: Secondary | ICD-10-CM | POA: Diagnosis not present

## 2017-11-30 DIAGNOSIS — M4317 Spondylolisthesis, lumbosacral region: Secondary | ICD-10-CM | POA: Diagnosis not present

## 2017-11-30 DIAGNOSIS — M5416 Radiculopathy, lumbar region: Secondary | ICD-10-CM | POA: Diagnosis not present

## 2017-12-11 DIAGNOSIS — M4807 Spinal stenosis, lumbosacral region: Secondary | ICD-10-CM | POA: Diagnosis not present

## 2017-12-11 DIAGNOSIS — M799 Soft tissue disorder, unspecified: Secondary | ICD-10-CM | POA: Diagnosis not present

## 2017-12-11 DIAGNOSIS — M545 Low back pain: Secondary | ICD-10-CM | POA: Diagnosis not present

## 2017-12-11 DIAGNOSIS — M6281 Muscle weakness (generalized): Secondary | ICD-10-CM | POA: Diagnosis not present

## 2017-12-13 DIAGNOSIS — M799 Soft tissue disorder, unspecified: Secondary | ICD-10-CM | POA: Diagnosis not present

## 2017-12-13 DIAGNOSIS — M6281 Muscle weakness (generalized): Secondary | ICD-10-CM | POA: Diagnosis not present

## 2017-12-13 DIAGNOSIS — M545 Low back pain: Secondary | ICD-10-CM | POA: Diagnosis not present

## 2017-12-13 DIAGNOSIS — M4807 Spinal stenosis, lumbosacral region: Secondary | ICD-10-CM | POA: Diagnosis not present

## 2017-12-20 DIAGNOSIS — M545 Low back pain: Secondary | ICD-10-CM | POA: Diagnosis not present

## 2017-12-20 DIAGNOSIS — M799 Soft tissue disorder, unspecified: Secondary | ICD-10-CM | POA: Diagnosis not present

## 2017-12-20 DIAGNOSIS — M6281 Muscle weakness (generalized): Secondary | ICD-10-CM | POA: Diagnosis not present

## 2017-12-20 DIAGNOSIS — M4807 Spinal stenosis, lumbosacral region: Secondary | ICD-10-CM | POA: Diagnosis not present

## 2017-12-22 DIAGNOSIS — M6281 Muscle weakness (generalized): Secondary | ICD-10-CM | POA: Diagnosis not present

## 2017-12-22 DIAGNOSIS — M545 Low back pain: Secondary | ICD-10-CM | POA: Diagnosis not present

## 2017-12-22 DIAGNOSIS — M799 Soft tissue disorder, unspecified: Secondary | ICD-10-CM | POA: Diagnosis not present

## 2017-12-22 DIAGNOSIS — M4807 Spinal stenosis, lumbosacral region: Secondary | ICD-10-CM | POA: Diagnosis not present

## 2017-12-25 DIAGNOSIS — M799 Soft tissue disorder, unspecified: Secondary | ICD-10-CM | POA: Diagnosis not present

## 2017-12-25 DIAGNOSIS — M545 Low back pain: Secondary | ICD-10-CM | POA: Diagnosis not present

## 2017-12-25 DIAGNOSIS — M4807 Spinal stenosis, lumbosacral region: Secondary | ICD-10-CM | POA: Diagnosis not present

## 2017-12-25 DIAGNOSIS — M6281 Muscle weakness (generalized): Secondary | ICD-10-CM | POA: Diagnosis not present

## 2017-12-27 DIAGNOSIS — M4807 Spinal stenosis, lumbosacral region: Secondary | ICD-10-CM | POA: Diagnosis not present

## 2017-12-27 DIAGNOSIS — M6281 Muscle weakness (generalized): Secondary | ICD-10-CM | POA: Diagnosis not present

## 2017-12-27 DIAGNOSIS — M799 Soft tissue disorder, unspecified: Secondary | ICD-10-CM | POA: Diagnosis not present

## 2017-12-27 DIAGNOSIS — M545 Low back pain: Secondary | ICD-10-CM | POA: Diagnosis not present

## 2017-12-27 DIAGNOSIS — G4733 Obstructive sleep apnea (adult) (pediatric): Secondary | ICD-10-CM | POA: Diagnosis not present

## 2017-12-28 DIAGNOSIS — M48062 Spinal stenosis, lumbar region with neurogenic claudication: Secondary | ICD-10-CM | POA: Diagnosis not present

## 2017-12-28 DIAGNOSIS — M461 Sacroiliitis, not elsewhere classified: Secondary | ICD-10-CM | POA: Diagnosis not present

## 2017-12-28 DIAGNOSIS — Z6839 Body mass index (BMI) 39.0-39.9, adult: Secondary | ICD-10-CM | POA: Diagnosis not present

## 2017-12-28 DIAGNOSIS — M5416 Radiculopathy, lumbar region: Secondary | ICD-10-CM | POA: Diagnosis not present

## 2018-01-01 DIAGNOSIS — M4807 Spinal stenosis, lumbosacral region: Secondary | ICD-10-CM | POA: Diagnosis not present

## 2018-01-01 DIAGNOSIS — M6281 Muscle weakness (generalized): Secondary | ICD-10-CM | POA: Diagnosis not present

## 2018-01-01 DIAGNOSIS — M545 Low back pain: Secondary | ICD-10-CM | POA: Diagnosis not present

## 2018-01-01 DIAGNOSIS — M799 Soft tissue disorder, unspecified: Secondary | ICD-10-CM | POA: Diagnosis not present

## 2018-01-03 DIAGNOSIS — M6281 Muscle weakness (generalized): Secondary | ICD-10-CM | POA: Diagnosis not present

## 2018-01-03 DIAGNOSIS — M545 Low back pain: Secondary | ICD-10-CM | POA: Diagnosis not present

## 2018-01-03 DIAGNOSIS — M799 Soft tissue disorder, unspecified: Secondary | ICD-10-CM | POA: Diagnosis not present

## 2018-01-03 DIAGNOSIS — M4807 Spinal stenosis, lumbosacral region: Secondary | ICD-10-CM | POA: Diagnosis not present

## 2018-01-08 ENCOUNTER — Other Ambulatory Visit (HOSPITAL_COMMUNITY): Payer: Self-pay | Admitting: Adult Health

## 2018-01-08 DIAGNOSIS — M6281 Muscle weakness (generalized): Secondary | ICD-10-CM | POA: Diagnosis not present

## 2018-01-08 DIAGNOSIS — M799 Soft tissue disorder, unspecified: Secondary | ICD-10-CM | POA: Diagnosis not present

## 2018-01-08 DIAGNOSIS — C50411 Malignant neoplasm of upper-outer quadrant of right female breast: Secondary | ICD-10-CM

## 2018-01-08 DIAGNOSIS — M545 Low back pain: Secondary | ICD-10-CM | POA: Diagnosis not present

## 2018-01-08 DIAGNOSIS — M4807 Spinal stenosis, lumbosacral region: Secondary | ICD-10-CM | POA: Diagnosis not present

## 2018-01-10 DIAGNOSIS — M799 Soft tissue disorder, unspecified: Secondary | ICD-10-CM | POA: Diagnosis not present

## 2018-01-10 DIAGNOSIS — M6281 Muscle weakness (generalized): Secondary | ICD-10-CM | POA: Diagnosis not present

## 2018-01-10 DIAGNOSIS — I10 Essential (primary) hypertension: Secondary | ICD-10-CM | POA: Diagnosis not present

## 2018-01-10 DIAGNOSIS — M545 Low back pain: Secondary | ICD-10-CM | POA: Diagnosis not present

## 2018-01-10 DIAGNOSIS — M5416 Radiculopathy, lumbar region: Secondary | ICD-10-CM | POA: Diagnosis not present

## 2018-01-10 DIAGNOSIS — M4807 Spinal stenosis, lumbosacral region: Secondary | ICD-10-CM | POA: Diagnosis not present

## 2018-01-10 DIAGNOSIS — M48062 Spinal stenosis, lumbar region with neurogenic claudication: Secondary | ICD-10-CM | POA: Diagnosis not present

## 2018-01-15 DIAGNOSIS — M4807 Spinal stenosis, lumbosacral region: Secondary | ICD-10-CM | POA: Diagnosis not present

## 2018-01-15 DIAGNOSIS — M799 Soft tissue disorder, unspecified: Secondary | ICD-10-CM | POA: Diagnosis not present

## 2018-01-15 DIAGNOSIS — M6281 Muscle weakness (generalized): Secondary | ICD-10-CM | POA: Diagnosis not present

## 2018-01-15 DIAGNOSIS — M545 Low back pain: Secondary | ICD-10-CM | POA: Diagnosis not present

## 2018-01-17 DIAGNOSIS — M6281 Muscle weakness (generalized): Secondary | ICD-10-CM | POA: Diagnosis not present

## 2018-01-17 DIAGNOSIS — M4807 Spinal stenosis, lumbosacral region: Secondary | ICD-10-CM | POA: Diagnosis not present

## 2018-01-17 DIAGNOSIS — M545 Low back pain: Secondary | ICD-10-CM | POA: Diagnosis not present

## 2018-01-17 DIAGNOSIS — M799 Soft tissue disorder, unspecified: Secondary | ICD-10-CM | POA: Diagnosis not present

## 2018-01-22 DIAGNOSIS — M799 Soft tissue disorder, unspecified: Secondary | ICD-10-CM | POA: Diagnosis not present

## 2018-01-22 DIAGNOSIS — M4807 Spinal stenosis, lumbosacral region: Secondary | ICD-10-CM | POA: Diagnosis not present

## 2018-01-22 DIAGNOSIS — M545 Low back pain: Secondary | ICD-10-CM | POA: Diagnosis not present

## 2018-01-22 DIAGNOSIS — M6281 Muscle weakness (generalized): Secondary | ICD-10-CM | POA: Diagnosis not present

## 2018-01-24 DIAGNOSIS — M799 Soft tissue disorder, unspecified: Secondary | ICD-10-CM | POA: Diagnosis not present

## 2018-01-24 DIAGNOSIS — M6281 Muscle weakness (generalized): Secondary | ICD-10-CM | POA: Diagnosis not present

## 2018-01-24 DIAGNOSIS — M545 Low back pain: Secondary | ICD-10-CM | POA: Diagnosis not present

## 2018-01-24 DIAGNOSIS — M4807 Spinal stenosis, lumbosacral region: Secondary | ICD-10-CM | POA: Diagnosis not present

## 2018-01-29 DIAGNOSIS — M6281 Muscle weakness (generalized): Secondary | ICD-10-CM | POA: Diagnosis not present

## 2018-01-29 DIAGNOSIS — M545 Low back pain: Secondary | ICD-10-CM | POA: Diagnosis not present

## 2018-01-29 DIAGNOSIS — M4807 Spinal stenosis, lumbosacral region: Secondary | ICD-10-CM | POA: Diagnosis not present

## 2018-01-29 DIAGNOSIS — M799 Soft tissue disorder, unspecified: Secondary | ICD-10-CM | POA: Diagnosis not present

## 2018-01-31 DIAGNOSIS — M799 Soft tissue disorder, unspecified: Secondary | ICD-10-CM | POA: Diagnosis not present

## 2018-01-31 DIAGNOSIS — M6281 Muscle weakness (generalized): Secondary | ICD-10-CM | POA: Diagnosis not present

## 2018-01-31 DIAGNOSIS — M4807 Spinal stenosis, lumbosacral region: Secondary | ICD-10-CM | POA: Diagnosis not present

## 2018-01-31 DIAGNOSIS — M545 Low back pain: Secondary | ICD-10-CM | POA: Diagnosis not present

## 2018-02-05 DIAGNOSIS — M799 Soft tissue disorder, unspecified: Secondary | ICD-10-CM | POA: Diagnosis not present

## 2018-02-05 DIAGNOSIS — M545 Low back pain: Secondary | ICD-10-CM | POA: Diagnosis not present

## 2018-02-05 DIAGNOSIS — M4807 Spinal stenosis, lumbosacral region: Secondary | ICD-10-CM | POA: Diagnosis not present

## 2018-02-05 DIAGNOSIS — M6281 Muscle weakness (generalized): Secondary | ICD-10-CM | POA: Diagnosis not present

## 2018-02-07 ENCOUNTER — Other Ambulatory Visit: Payer: Self-pay | Admitting: Adult Health

## 2018-02-07 DIAGNOSIS — M4807 Spinal stenosis, lumbosacral region: Secondary | ICD-10-CM | POA: Diagnosis not present

## 2018-02-07 DIAGNOSIS — R928 Other abnormal and inconclusive findings on diagnostic imaging of breast: Secondary | ICD-10-CM

## 2018-02-07 DIAGNOSIS — M545 Low back pain: Secondary | ICD-10-CM | POA: Diagnosis not present

## 2018-02-07 DIAGNOSIS — M6281 Muscle weakness (generalized): Secondary | ICD-10-CM | POA: Diagnosis not present

## 2018-02-07 DIAGNOSIS — M799 Soft tissue disorder, unspecified: Secondary | ICD-10-CM | POA: Diagnosis not present

## 2018-02-20 ENCOUNTER — Encounter (HOSPITAL_COMMUNITY): Payer: Self-pay

## 2018-02-20 ENCOUNTER — Inpatient Hospital Stay (HOSPITAL_COMMUNITY): Payer: Medicare HMO | Attending: Internal Medicine

## 2018-02-20 ENCOUNTER — Inpatient Hospital Stay (HOSPITAL_COMMUNITY): Payer: Medicare HMO

## 2018-02-20 ENCOUNTER — Ambulatory Visit (HOSPITAL_COMMUNITY)
Admission: RE | Admit: 2018-02-20 | Discharge: 2018-02-20 | Disposition: A | Discharge status: Home / Self Care | Payer: Medicare HMO | Source: Ambulatory Visit | Attending: Adult Health | Admitting: Adult Health

## 2018-02-20 VITALS — BP 138/48 | HR 71 | Temp 98.0°F | Resp 16

## 2018-02-20 DIAGNOSIS — M8580 Other specified disorders of bone density and structure, unspecified site: Secondary | ICD-10-CM | POA: Diagnosis not present

## 2018-02-20 DIAGNOSIS — Z170 Estrogen receptor positive status [ER+]: Secondary | ICD-10-CM | POA: Insufficient documentation

## 2018-02-20 DIAGNOSIS — C50411 Malignant neoplasm of upper-outer quadrant of right female breast: Secondary | ICD-10-CM

## 2018-02-20 DIAGNOSIS — Z79811 Long term (current) use of aromatase inhibitors: Secondary | ICD-10-CM | POA: Diagnosis not present

## 2018-02-20 DIAGNOSIS — Z79899 Other long term (current) drug therapy: Secondary | ICD-10-CM | POA: Insufficient documentation

## 2018-02-20 DIAGNOSIS — R922 Inconclusive mammogram: Secondary | ICD-10-CM | POA: Diagnosis not present

## 2018-02-20 LAB — CBC WITH DIFFERENTIAL/PLATELET
Basophils Absolute: 0 10*3/uL (ref 0.0–0.1)
Basophils Relative: 1 %
EOS PCT: 5 %
Eosinophils Absolute: 0.3 10*3/uL (ref 0.0–0.7)
HCT: 39.3 % (ref 36.0–46.0)
HEMOGLOBIN: 12.8 g/dL (ref 12.0–15.0)
LYMPHS ABS: 1.4 10*3/uL (ref 0.7–4.0)
LYMPHS PCT: 22 %
MCH: 31.3 pg (ref 26.0–34.0)
MCHC: 32.6 g/dL (ref 30.0–36.0)
MCV: 96.1 fL (ref 78.0–100.0)
MONOS PCT: 9 %
Monocytes Absolute: 0.6 10*3/uL (ref 0.1–1.0)
NEUTROS PCT: 63 %
Neutro Abs: 4 10*3/uL (ref 1.7–7.7)
Platelets: 245 10*3/uL (ref 150–400)
RBC: 4.09 MIL/uL (ref 3.87–5.11)
RDW: 13.1 % (ref 11.5–15.5)
WBC: 6.3 10*3/uL (ref 4.0–10.5)

## 2018-02-20 LAB — COMPREHENSIVE METABOLIC PANEL
ALK PHOS: 70 U/L (ref 38–126)
ALT: 17 U/L (ref 14–54)
AST: 23 U/L (ref 15–41)
Albumin: 4.2 g/dL (ref 3.5–5.0)
Anion gap: 11 (ref 5–15)
BUN: 18 mg/dL (ref 6–20)
CALCIUM: 9.7 mg/dL (ref 8.9–10.3)
CO2: 24 mmol/L (ref 22–32)
CREATININE: 0.88 mg/dL (ref 0.44–1.00)
Chloride: 102 mmol/L (ref 101–111)
GFR calc Af Amer: >60 mL/min (ref >60)
Glucose, Bld: 96 mg/dL (ref 65–99)
Potassium: 3.9 mmol/L (ref 3.5–5.1)
Sodium: 137 mmol/L (ref 135–145)
TOTAL PROTEIN: 7.4 g/dL (ref 6.5–8.1)
Total Bilirubin: 0.7 mg/dL (ref 0.3–1.2)

## 2018-02-20 MED ORDER — SODIUM CHLORIDE 0.9 % IV SOLN: Freq: Once | INTRAVENOUS | Status: DC

## 2018-02-20 MED ORDER — DENOSUMAB 60 MG/ML ~~LOC~~ SOLN
60.0000 mg | Freq: Once | SUBCUTANEOUS | Status: AC
  Administered 2018-02-20: 60 mg via SUBCUTANEOUS
  Filled 2018-02-20: qty 1

## 2018-02-20 NOTE — Progress Notes (Signed)
Crystal [name] tolerated Prolia injection well without complaints or incident. Calcium 9.7 today and pt denied any tooth,jaw or leg pain and no recent or future dental appts. VSS Pt discharged self ambulatory in satisfactory condition

## 2018-02-20 NOTE — Patient Instructions (Signed)
[facility] at [facility] Discharge Instructions  RECOMMENDATIONS MADE BY THE CONSULTANT AND ANY TEST RESULTS WILL BE SENT TO YOUR REFERRING PHYSICIAN.  Received Prolia injection today. Follow-up as scheduled. Call clinic for any questions or concerns  Thank you for choosing [facility] at [facility] to provide your oncology and hematology care.  To afford each patient quality time with our provider, please arrive at least 15 minutes before your scheduled appointment time.    If you have a lab appointment with the Cancer Center please come in thru the  Main Entrance and check in at the main information desk  You need to re-schedule your appointment should you arrive 10 or more minutes late.  We strive to give you quality time with our providers, and arriving late affects you and other patients whose appointments are after yours.  Also, if you no show three or more times for appointments you may be dismissed from the clinic at the providers discretion.     Again, thank you for choosing [facility].  Our hope is that these requests will decrease the amount of time that you wait before being seen by our physicians.       _____________________________________________________________  Should you have questions after your visit to [facility], please contact our office at [phone] between the hours of 8:30 a.m. and 4:30 p.m.  Voicemails left after 4:30 p.m. will not be returned until the following business day.  For prescription refill requests, have your pharmacy contact our office.       Resources For Cancer Patients and their Caregivers ? American Cancer Society: Can assist with transportation, wigs, general needs, runs Look Good Feel Better.        1-888-227-6333 ? Cancer Care: Provides financial assistance, online support groups, medication/co-pay assistance.  1-800-813-HOPE (4673) ? [facility] Assists [place] cancer patients and their families through emotional , educational and financial support.  336-427-4357 ? [place] DSS Where to apply for food stamps, Medicaid and utility assistance. 336-342-1394 ? RCATS: Transportation to medical appointments. 336-347-2287 ? Social Security Administration: May apply for disability if have a Stage IV cancer. 336-342-7796 1-800-772-1213 ? [place] Aging, Disability and Transit Services: Assists with nutrition, care and transit needs. 336-349-2343  Cancer Center Support Programs: @[email or web address]@ > Cancer Support Group  2nd Tuesday of the month 1pm-2pm, Journey Room  > Creative Journey  3rd Tuesday of the month 1130am-1pm, Journey Room  > Look Good Feel Better  1st Wednesday of the month 10am-12 noon, Journey Room (Call American Cancer Society to register 1-800-395-5775)   

## 2018-02-21 LAB — VITAMIN D 25 HYDROXY (VIT D DEFICIENCY, FRACTURES): VIT D 25 HYDROXY: 36 ng/mL (ref 30.0–100.0)

## 2018-02-22 ENCOUNTER — Other Ambulatory Visit (HOSPITAL_COMMUNITY): Payer: Medicare HMO

## 2018-02-22 DIAGNOSIS — M1990 Unspecified osteoarthritis, unspecified site: Secondary | ICD-10-CM | POA: Diagnosis not present

## 2018-02-22 DIAGNOSIS — Z79899 Other long term (current) drug therapy: Secondary | ICD-10-CM | POA: Diagnosis not present

## 2018-02-22 DIAGNOSIS — I10 Essential (primary) hypertension: Secondary | ICD-10-CM | POA: Diagnosis not present

## 2018-02-22 DIAGNOSIS — E049 Nontoxic goiter, unspecified: Secondary | ICD-10-CM | POA: Diagnosis not present

## 2018-02-22 DIAGNOSIS — F329 Major depressive disorder, single episode, unspecified: Secondary | ICD-10-CM | POA: Diagnosis not present

## 2018-03-01 ENCOUNTER — Inpatient Hospital Stay (HOSPITAL_COMMUNITY): Payer: Medicare HMO | Attending: Internal Medicine | Admitting: Internal Medicine

## 2018-03-01 ENCOUNTER — Other Ambulatory Visit: Payer: Self-pay

## 2018-03-01 ENCOUNTER — Encounter (HOSPITAL_COMMUNITY): Payer: Self-pay | Admitting: Internal Medicine

## 2018-03-01 VITALS — BP 148/94 | HR 86 | Temp 98.7°F | Resp 18 | Wt 222.8 lb

## 2018-03-01 DIAGNOSIS — M8580 Other specified disorders of bone density and structure, unspecified site: Secondary | ICD-10-CM | POA: Insufficient documentation

## 2018-03-01 DIAGNOSIS — G4733 Obstructive sleep apnea (adult) (pediatric): Secondary | ICD-10-CM | POA: Diagnosis not present

## 2018-03-01 DIAGNOSIS — M1990 Unspecified osteoarthritis, unspecified site: Secondary | ICD-10-CM | POA: Diagnosis not present

## 2018-03-01 DIAGNOSIS — Z923 Personal history of irradiation: Secondary | ICD-10-CM | POA: Insufficient documentation

## 2018-03-01 DIAGNOSIS — M48061 Spinal stenosis, lumbar region without neurogenic claudication: Secondary | ICD-10-CM | POA: Diagnosis not present

## 2018-03-01 DIAGNOSIS — Z79899 Other long term (current) drug therapy: Secondary | ICD-10-CM | POA: Diagnosis not present

## 2018-03-01 DIAGNOSIS — K219 Gastro-esophageal reflux disease without esophagitis: Secondary | ICD-10-CM | POA: Insufficient documentation

## 2018-03-01 DIAGNOSIS — Z9989 Dependence on other enabling machines and devices: Secondary | ICD-10-CM | POA: Insufficient documentation

## 2018-03-01 DIAGNOSIS — Z170 Estrogen receptor positive status [ER+]: Secondary | ICD-10-CM | POA: Insufficient documentation

## 2018-03-01 DIAGNOSIS — Z7982 Long term (current) use of aspirin: Secondary | ICD-10-CM | POA: Diagnosis not present

## 2018-03-01 DIAGNOSIS — I10 Essential (primary) hypertension: Secondary | ICD-10-CM | POA: Insufficient documentation

## 2018-03-01 DIAGNOSIS — Z0001 Encounter for general adult medical examination with abnormal findings: Secondary | ICD-10-CM | POA: Diagnosis not present

## 2018-03-01 DIAGNOSIS — Z6841 Body Mass Index (BMI) 40.0 and over, adult: Secondary | ICD-10-CM | POA: Diagnosis not present

## 2018-03-01 DIAGNOSIS — G4730 Sleep apnea, unspecified: Secondary | ICD-10-CM | POA: Diagnosis not present

## 2018-03-01 DIAGNOSIS — C50911 Malignant neoplasm of unspecified site of right female breast: Secondary | ICD-10-CM | POA: Diagnosis not present

## 2018-03-01 DIAGNOSIS — Z79811 Long term (current) use of aromatase inhibitors: Secondary | ICD-10-CM | POA: Diagnosis not present

## 2018-03-01 DIAGNOSIS — C50411 Malignant neoplasm of upper-outer quadrant of right female breast: Secondary | ICD-10-CM | POA: Insufficient documentation

## 2018-03-01 DIAGNOSIS — Z6839 Body mass index (BMI) 39.0-39.9, adult: Secondary | ICD-10-CM | POA: Diagnosis not present

## 2018-03-01 DIAGNOSIS — F325 Major depressive disorder, single episode, in full remission: Secondary | ICD-10-CM | POA: Diagnosis not present

## 2018-03-01 NOTE — Patient Instructions (Signed)
[place] at [facility] Discharge Instructions   You were seen today by Dr. Zoila Shutter. Dr. Walden Field went over your recent labs and mammogram. They were both good! Continue Prolia injections every 6 months as well as your anastrozole. Return in 6 months for follow up and Prolia injection.    Thank you for choosing [place] at [facility] to provide your oncology and hematology care.  To afford each patient quality time with our provider, please arrive at least 15 minutes before your scheduled appointment time.    If you have a lab appointment with the [place] please come in thru the  Main Entrance and check in at the main information desk  You need to re-schedule your appointment should you arrive 10 or more minutes late.  We strive to give you quality time with our providers, and arriving late affects you and other patients whose appointments are after yours.  Also, if you no show three or more times for appointments you may be dismissed from the clinic at the providers discretion.     Again, thank you for choosing [facility].  Our hope is that these requests will decrease the amount of time that you wait before being seen by our physicians.       _____________________________________________________________  Should you have questions after your visit to [facility], please contact our office at [phone] between the hours of 8:30 a.m. and 4:30 p.m.  Voicemails left after 4:30 p.m. will not be returned until the following business day.  For prescription refill requests, have your pharmacy contact our office.       Resources For Cancer Patients and their Caregivers ? American Cancer Society: Can assist with transportation, wigs, general needs, runs Look Good Feel Better.        2062969704 ? Cancer Care: Provides financial assistance, online support groups, medication/co-pay assistance.   1-800-813-HOPE (816) 141-4524) ? [facility] Assists [place] cancer patients and their families through emotional , educational and financial support.  (240)085-2409 ? [place] DSS Where to apply for food stamps, Medicaid and utility assistance. 507-508-3827 ? RCATS: Transportation to medical appointments. (845)792-8696 ? Social Security Administration: May apply for disability if have a Stage IV cancer. 3085731515 (754) 227-5287 ? [employer], Disability and Transit Services: Assists with nutrition, care and transit needs. [place] Support Programs:   > Cancer Support Group  2nd Tuesday of the month 1pm-2pm, Journey Room   > Creative Journey  3rd Tuesday of the month 1130am-1pm, Journey Room

## 2018-03-12 ENCOUNTER — Other Ambulatory Visit (HOSPITAL_COMMUNITY): Payer: Self-pay | Admitting: Adult Health

## 2018-03-12 DIAGNOSIS — C50411 Malignant neoplasm of upper-outer quadrant of right female breast: Secondary | ICD-10-CM

## 2018-03-15 DIAGNOSIS — H02412 Mechanical ptosis of left eyelid: Secondary | ICD-10-CM | POA: Diagnosis not present

## 2018-03-21 IMAGING — MR MR HEAD W/O CM
7 of 10 series · 30 of 48 positions shown · non-contrast
Comparison: None.

CLINICAL DATA: Imbalance. Multiple recent falls. History of breast
cancer.

EXAM:
MRI HEAD WITHOUT CONTRAST
TECHNIQUE: Multiplanar, multiecho pulse sequences of the brain and surrounding
structures were obtained without intravenous contrast.

[Series 3: DWI · axial · 3.0mm · 0.70mm/px · z∈[-30,+122]mm · 6 of 55 slices shown (1 of 4)]
[im 1/55]
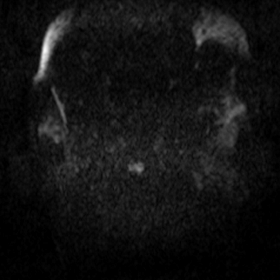
[im 11/55]
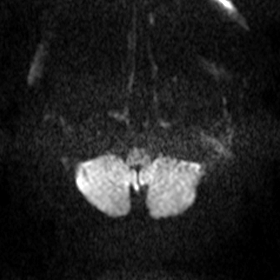
[im 22/55]
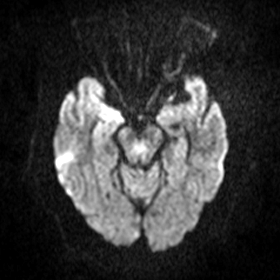
[im 33/55]
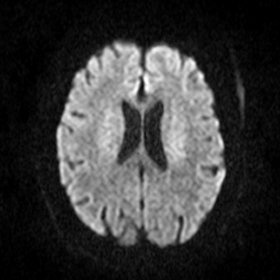
[im 44/55]
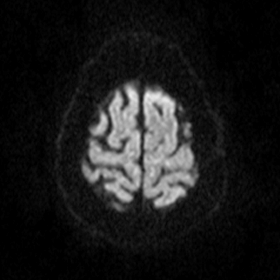
[im 55/55]
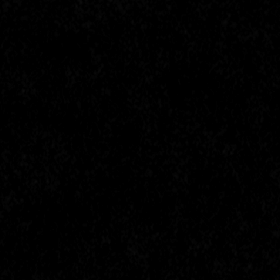

[Series 4: DWI · axial · 3.0mm · 0.70mm/px · z∈[-30,+119]mm · 6 of 54 slices shown (2 of 4)]
[im 1/54]
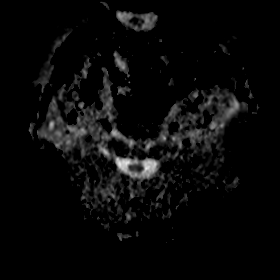
[im 11/54]
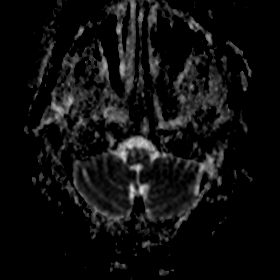
[im 22/54]
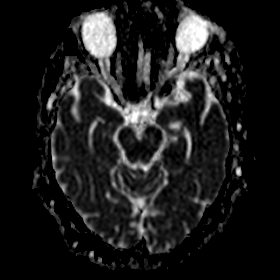
[im 32/54]
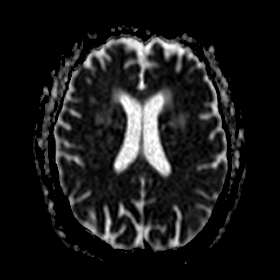
[im 43/54]
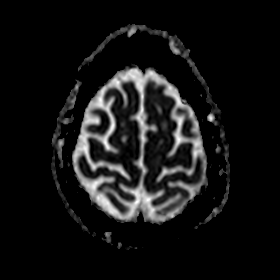
[im 54/54]
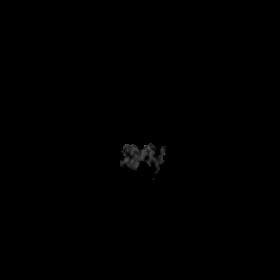

[Series 5: DWI · coronal · 5.0mm · 0.47mm/px · 4 of 34 slices shown (3 of 4)]
[im 1/34]
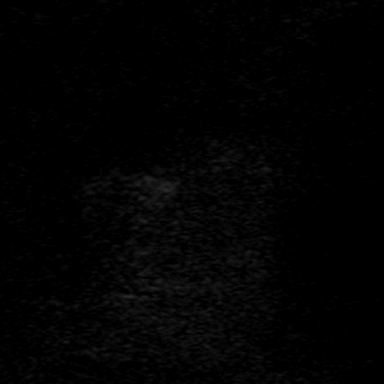
[im 12/34]
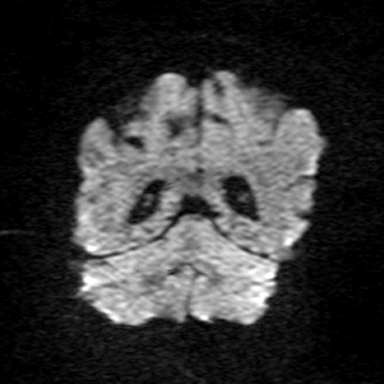
[im 23/34]
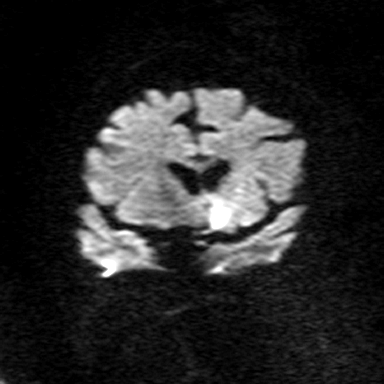
[im 34/34]
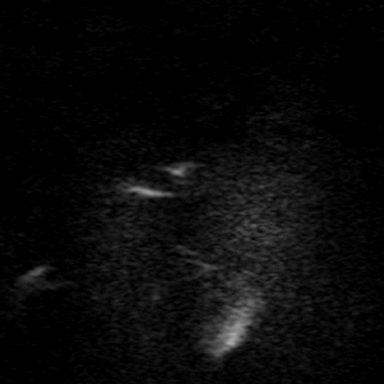

[Series 6: DWI · coronal · 5.0mm · 0.47mm/px · 4 of 34 slices shown (4 of 4)]
[im 1/34]
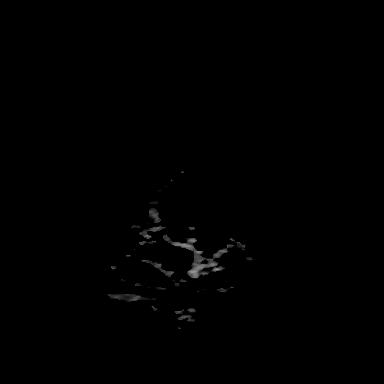
[im 12/34]
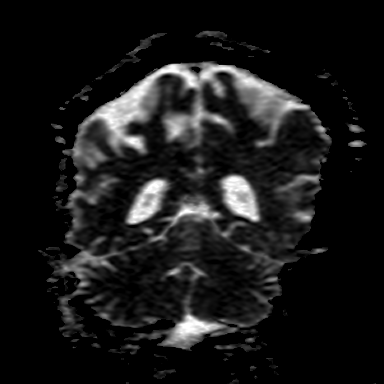
[im 23/34]
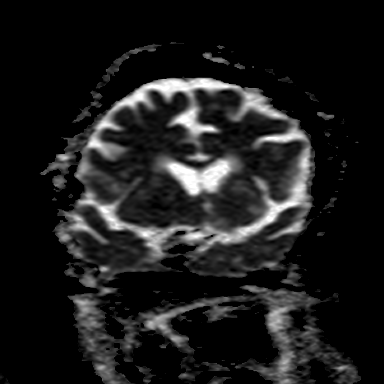
[im 34/34]
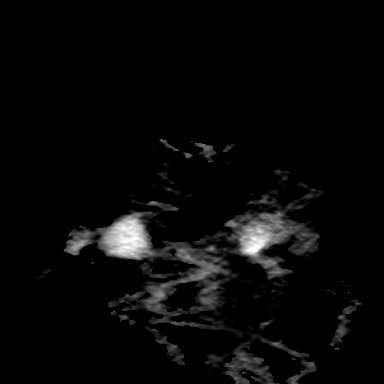

[Series 8: T2 · axial · 5.0mm · 0.47mm/px · z∈[-17,+118]mm · 3 of 23 slices shown (1 of 2)]
[im 1/23]
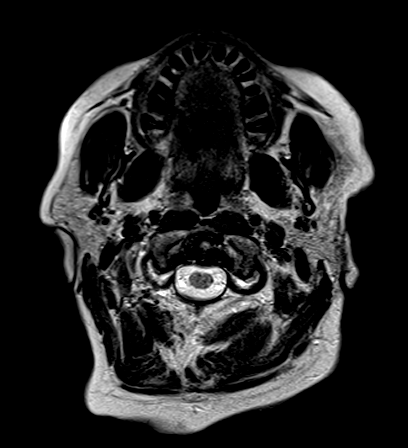
[im 12/23]
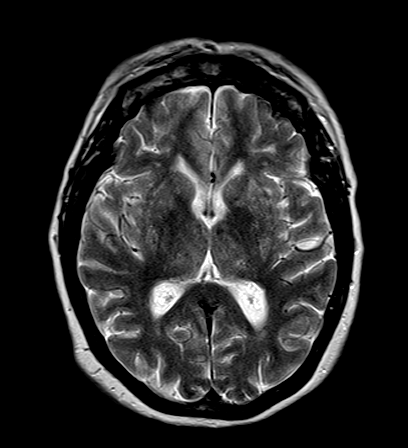
[im 23/23]
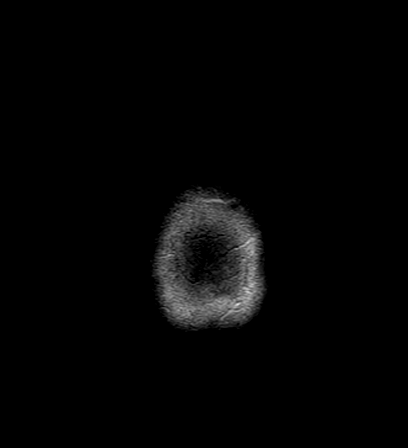

[Series 9: FLAIR · axial · 3.0mm · 0.33mm/px · z∈[-21,+122]mm · 6 of 52 slices shown]
[im 1/52]
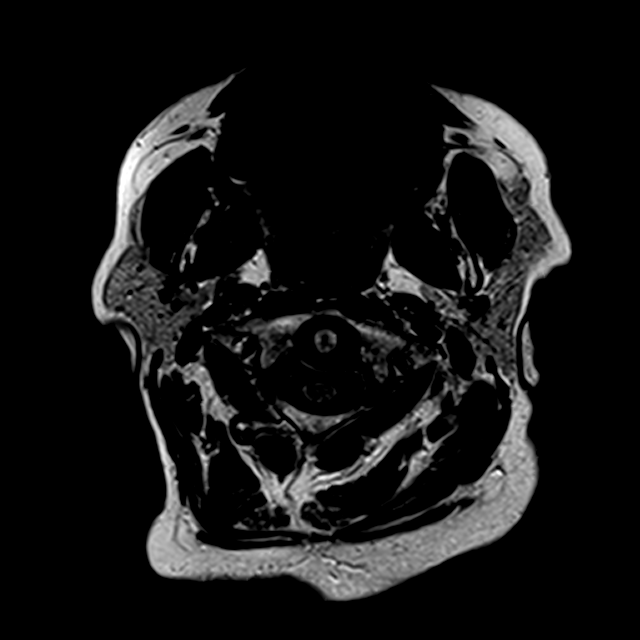
[im 11/52]
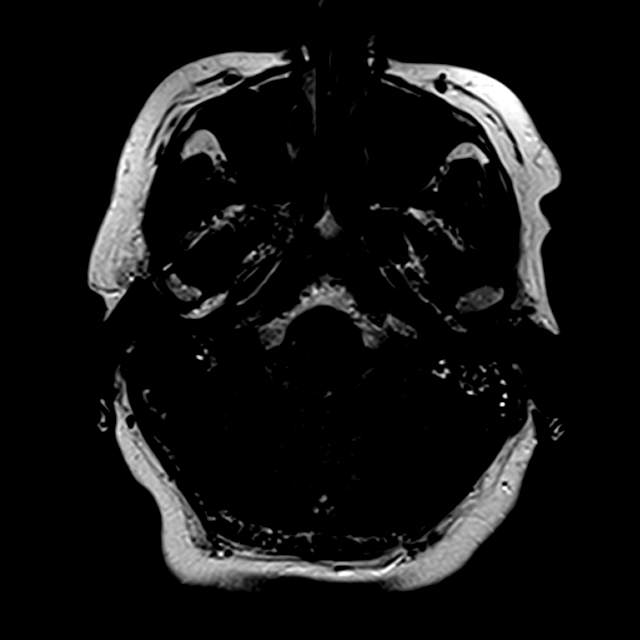
[im 21/52]
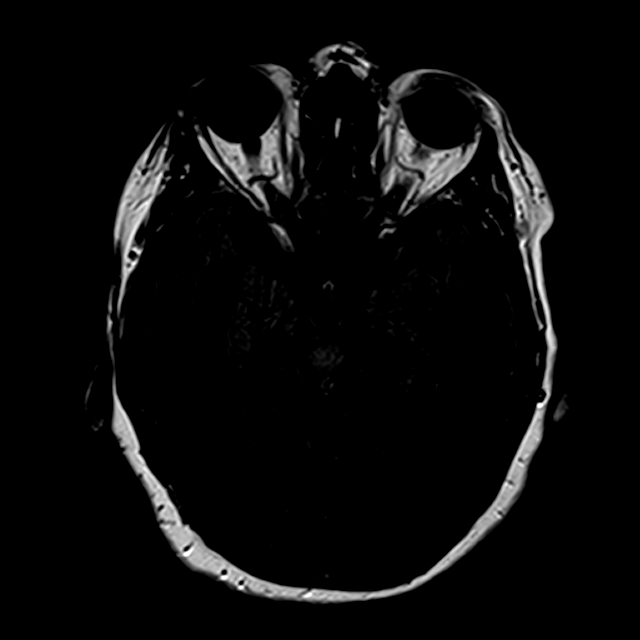
[im 31/52]
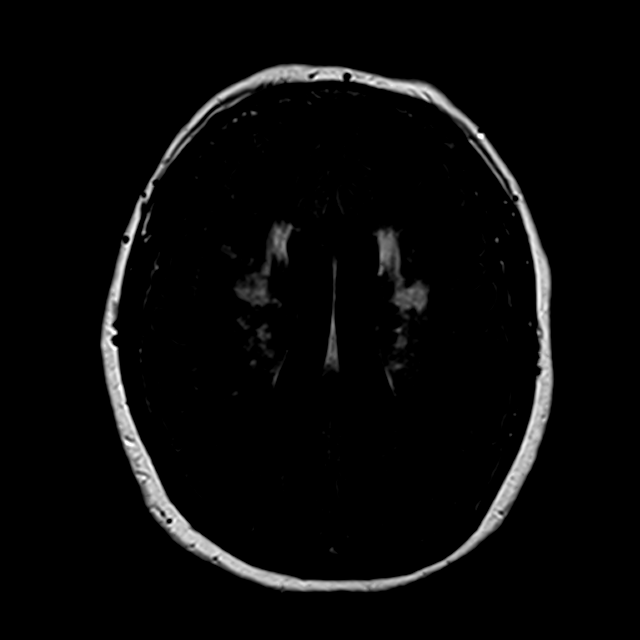
[im 41/52]
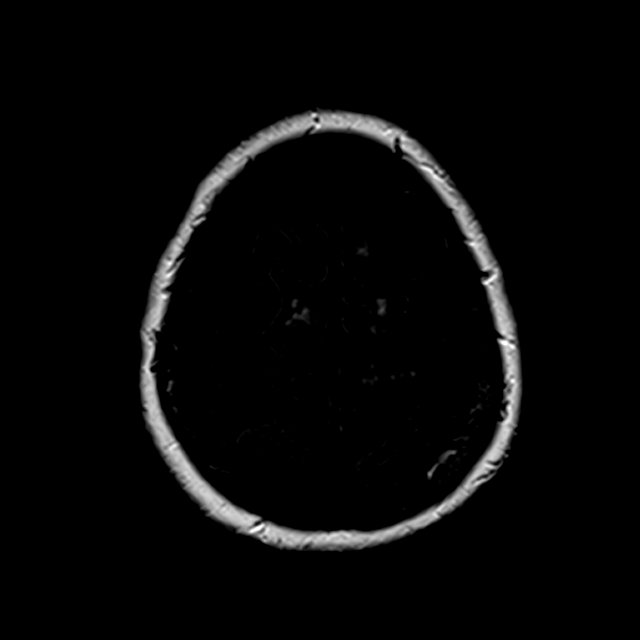
[im 52/52]
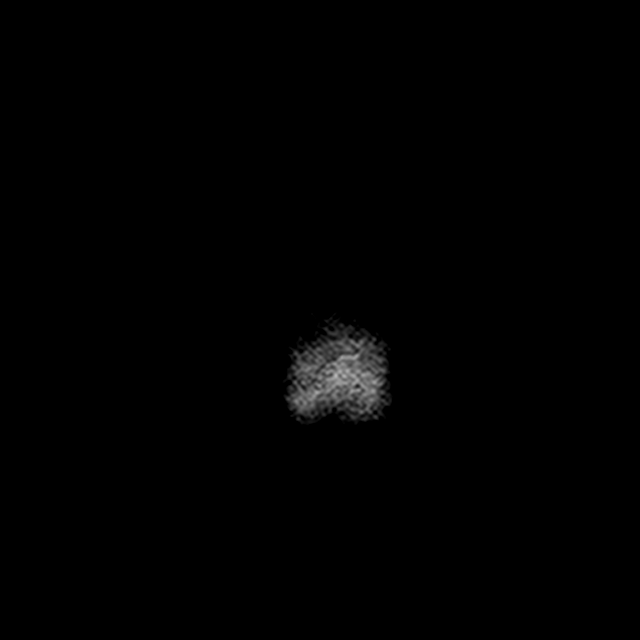

[Series 12: T2 · coronal · 5.0mm · 0.46mm/px · 1 of 30 slices shown (2 of 2)]
[im 1/30]
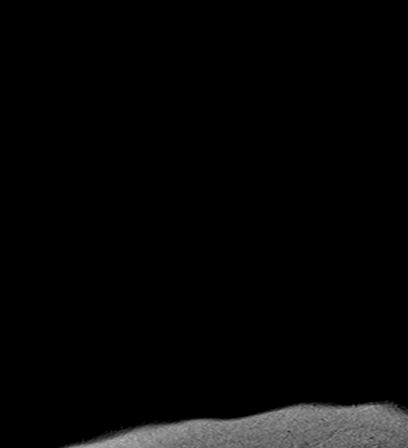

[30 of 48 positions shown; findings below may reference images not displayed]

FINDINGS: Brain: There is no evidence of acute infarct, intracranial
hemorrhage, mass, midline shift, or extra-axial fluid collection.
The ventricles and sulci are normal. Foci of T2 hyperintensity
throughout the subcortical and deep cerebral white matter and pons
are nonspecific but compatible with moderate chronic small vessel
ischemic disease.

Vascular: Major intracranial vascular flow voids are preserved.

Skull and upper cervical spine: Unremarkable bone marrow signal.

Sinuses/Orbits: Bilateral cataract extraction. Mild right maxillary
sinus mucosal thickening. Trace right and small left mastoid
effusions.

Other: None.
IMPRESSION: 1. No acute intracranial abnormality.
2. Moderate chronic small vessel ischemic disease.
3. Small mastoid effusions.

## 2018-03-26 NOTE — Progress Notes (Signed)
Diagnosis No diagnosis found.  Staging Cancer Staging Breast cancer [facility]) Staging form: Breast, AJCC 7th Edition - Clinical stage from 03/31/2015: Stage IA (T1b, N0, M0) - Signed by Baird Cancer, PA-C on 04/17/2016   Assessment and Plan:  1.  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  s/p lumpectomy with Dr. Arnoldo Morale. Oncotype DX score 4-low risk suggesting no benefit from adjuvant chemotherapy. Completed adjuvant radiation therapy with Dr. Pablo Ledger on 06/05/15. Started anti-estrogen therapy with Anastrozole in 05/2015. She was previously followed by Dr. Talbert Cage and recommended to continue Arimidex likely for 5-10 years. She discussed Breast Cancer Index (BCI) testing to predict benefit from extension of anti-estrogen therapy.  Results of BCI done 08/2017 showed a low liklihood of benefit to extended adjuvant therapy.  She will be planned for 5 years of therapy which should continue until 05/2020.    Diagnostic mammogram done 02/20/2018 showed no evidence of malignancy.  She is recommended for bilateral diagnostic mammogram in February 2020.  She will return to clinic in 6 months for follow-up.  She should notify the office if she has any problems prior to that time.    Labs done 01/2018 WNL.    2.  Osteopenia.  Last DEXA scan 05/2017 showed osteopenia with T-score -1.1.  Pt was recommended calcium/vitamin D supplementation with OTC supplements. She previously had vitamin D deficiency that corrected with prescription-strength medication.  She is also on Prolia injections every 6 months.   She will be set up for repeat BMD in 05/2019.    3.  Hypertension.  BP is 148/94.  Follow-up with PCP as directed.     Current Status:  Pt is here today for follow-up to go over recent mammogram.       Breast cancer ([place])   02/19/2015 Procedure    Breast, right, needle core biopsy, UOQ      02/19/2015 Pathology Results    - INVASIVE DUCTAL CARCINOMA.   ER 100%, PR 100%, Ki-67 18%, HER2 NEGATIVE      03/18/2015 Procedure    Breast, lumpectomy, right breast mass by Dr. Arnoldo Morale       03/18/2015 Pathology Results    - INVASIVE DUCTAL CARCINOMA, NOTTINGHAM COMBINED HISTOLOGIC GRADE II, 0.9 CM. - LOW GRADE DUCTAL CARCINOMA IN SITU. - RESECTION MARGINS, NEGATIVE FOR ATYPIA OR MALIGNANCY.      03/31/2015 Initial Diagnosis    Breast cancer      04/02/2015 Oncotype testing    Recurrence score of 4, low-risk.      04/30/2015 - 06/05/2015 Radiation Therapy    Right breast 50 Gy at 2 Gy/fraction x 25 fractions.  Dr. Pablo Ledger      06/08/2015 Imaging    DEXA-  BMD as determined from Femur Neck Right is 0.843 g/cm2 with a T-Score of -1.4. This patient is considered osteopenic according to [facility]) criteria.      06/10/2015 -  Anti-estrogen oral therapy    Arimidex      09/15/2017 Miscellaneous    BCI: 3.6% risk of late recurrence. Low likelihood of benefit from extended endocrine therapy.        Problem List Patient Active Problem List   Diagnosis Date Noted  . Lumbar spinal stenosis [M48.061] 11/07/2017  . Gait abnormality [R26.9] 10/18/2017  . Memory disorder [R41.3] 10/18/2017  . Osteopenia determined by x-ray [M85.80] 07/23/2015  . Breast cancer ([place]) [C50.919] 03/31/2015  . Colitis, ischemic ([place]) [K55.9] 06/27/2011  .  Tubular adenoma [D36.9] 06/27/2011    Past Medical History Past Medical History:  Diagnosis Date  . Breast cancer ([place]) 03/21/15   right  . Chronic fatigue   . Colitis, ischemic ([place]) 08/2007, 05/2011   Last colonoscopy/bx by Dr Rourk->(08/31/07) descending colon  . Depression   . Diverticulosis   . Enlarged thyroid   . Gait abnormality 10/18/2017  . GERD (gastroesophageal reflux disease)   . HTN (hypertension)   . Lumbar spinal stenosis 11/07/2017   Severe at L4-5, moderate at L3-4  . 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 OA (osteoarthritis)   . Obesity   . PONV (postoperative nausea and  vomiting)   . Ptosis of eyelid, left   . Sleep apnea    cpap  . Spinal stenosis   . Tubular adenoma of colon 08/2007    Past Surgical History Past Surgical History:  Procedure Laterality Date  . ABDOMINAL HYSTERECTOMY     partial  . BREAST CYST EXCISION     benign, left x2, right x1  . CATARACT EXTRACTION W/PHACO  12/01/2011   Procedure: CATARACT EXTRACTION PHACO AND INTRAOCULAR LENS PLACEMENT (IOC);  Surgeon: Tonny Branch;  Location: [facility];  Service: Ophthalmology;  Laterality: Right;  CDE=14.87  . CATARACT EXTRACTION W/PHACO  02/06/2012   Procedure: CATARACT EXTRACTION PHACO AND INTRAOCULAR LENS PLACEMENT (IOC);  Surgeon: Tonny Branch, MD;  Location: [facility];  Service: Ophthalmology;  Laterality: Left;  CDE 13.00  . COLONOSCOPY  Sept 2008   Last colonoscopy/bx by Dr Rourk->(08/31/07) descending colon TUBULAR ADENOMA  . COLONOSCOPY  10/25/2012   Procedure: COLONOSCOPY;  Surgeon: Daneil Dolin, MD;  Location: [facility] SU[facility];  Service: Endoscopy;  Laterality: N/A;  10:15-changed to 10:30 Darius Bump notified  . PARTIAL MASTECTOMY WITH NEEDLE LOCALIZATION AND AXILLARY SENTINEL LYMPH NODE BX Right 03/18/2015   Procedure: PARTIAL MASTECTOMY AFTER NEEDLE LOCALIZATION AND AXILLARY SENTINEL LYMPH NODE BX;  Surgeon: Aviva Signs Md, MD;  Location: [facility];  Service: General;  Laterality: Right;  . PTOSIS REPAIR Left 05/11/2017   Procedure: INTERNAL PTOSIS REPAIR OF LEFT EYELID;  Surgeon: Clista Bernhardt, MD;  Location: [place];  Service: Ophthalmology;  Laterality: Left;  . TUBAL LIGATION  1970   hysterectomy    Family History Family History  Problem Relation Age of Onset  . Cancer Brother 76  . Coronary artery disease Brother   . Diabetes Sister   . Heart disease Father   . Heart disease Brother   . Anesthesia problems Neg Hx   . Hypotension Neg Hx   . Pseudochol deficiency Neg Hx   . Malignant hyperthermia Neg Hx   . Colon cancer Neg Hx      Social History  reports that she has never  smoked. She has never used smokeless tobacco. She reports that she does not drink alcohol or use drugs.  Medications  Current Outpatient Medications:  .  acetaminophen (TYLENOL ARTHRITIS PAIN) 650 MG CR tablet, Take 1,300 mg by mouth 2 (two) times daily. , Disp: , Rfl:  .  amLODipine (NORVASC) 10 MG tablet, Take 10 mg by mouth every evening. , Disp: , Rfl:  .  anastrozole (ARIMIDEX) 1 MG tablet, TAKE 1 TABLET EVERY DAY, Disp: 90 tablet, Rfl: 1 .  Artificial Tear Solution (SOOTHE XP OP), Place 1 drop into both eyes 2 (two) times daily., Disp: , Rfl:  .  aspirin EC 81 MG tablet, Take 81 mg by mouth daily.  , Disp: , Rfl:  .  buPROPion [facility]  SR) 150 MG 12 hr tablet, Take 150 mg by mouth 2 (two) times daily. , Disp: , Rfl:  .  Calcium-Vitamin D (CALTRATE 600 PLUS-VIT D PO), Take 1 tablet by mouth daily., Disp: , Rfl:  .  FLUoxetine (PROZAC) 20 MG capsule, Take 60 mg by mouth daily. , Disp: , Rfl:  .  fluticasone (FLONASE) 50 MCG/ACT nasal spray, Place 2 sprays into both nostrils daily. , Disp: , Rfl:  .  loratadine (CLARITIN) 10 MG tablet, Take 10 mg by mouth daily.  , Disp: , Rfl:  .  losartan-hydrochlorothiazide (HYZAAR) 100-25 MG per tablet, Take 1 tablet by mouth daily., Disp: , Rfl:  .  pantoprazole (PROTONIX) 40 MG tablet, TAKE 1 TABLET BY MOUTH TWICE DAILY (Patient taking differently: TAKE 1 TABLET BY MOUTH DAILY 30 MINUTES BEFORE BREAKFAST), Disp: 60 tablet, Rfl: 5 .  promethazine (PHENERGAN) 25 MG tablet, Take 0.5-1 tablets (12.5-25 mg total) by mouth every 6 (six) hours as needed for nausea or vomiting., Disp: 30 tablet, Rfl: 0 .  simvastatin (ZOCOR) 10 MG tablet, Take 10 mg by mouth at bedtime. , Disp: , Rfl:   Allergies Bactrim [sulfamethoxazole-trimethoprim]  Review of Systems Review of Systems - Oncology ROS as per HPI otherwise 12 point ROS is negative.   Physical Exam  Vitals Wt Readings from Last 3 Encounters:  03/01/18 222 lb 12.8 oz (101.1 kg)  10/18/17 226 lb  (102.5 kg)  09/26/17 226 lb 8 oz (102.7 kg)   Temp Readings from Last 3 Encounters:  03/01/18 98.7 F (37.1 C) (Oral)  02/20/18 98 F (36.7 C) (Oral)  08/15/17 98.4 F (36.9 C) (Oral)   BP Readings from Last 3 Encounters:  03/01/18 (!) 148/94  02/20/18 (!) 138/48  10/18/17 (!) 152/73   Pulse Readings from Last 3 Encounters:  03/01/18 86  02/20/18 71  10/18/17 80    Constitutional: Well-developed, well-nourished, and in no distress.   HENT: Head: Normocephalic and atraumatic.  Mouth/Throat: No oropharyngeal exudate. Mucosa moist. Eyes: Pupils are equal, round, and reactive to light. Conjunctivae are normal. No scleral icterus.  Neck: Normal range of motion. Neck supple. No JVD present.  Cardiovascular: Normal rate, regular rhythm and normal heart sounds.  Exam reveals no gallop and no friction rub.   No murmur heard. Pulmonary/Chest: Effort normal and breath sounds normal. No respiratory distress. No wheezes.No rales.  Abdominal: Soft. Bowel sounds are normal. No distension. There is no tenderness. There is no guarding.  Musculoskeletal: No edema or tenderness.  Lymphadenopathy: No cervical, axillary or supraclavicular adenopathy.  Neurological: Alert and oriented to person, place, and time. No cranial nerve deficit.  Skin: Skin is warm and dry. No rash noted. No erythema. No pallor.  Psychiatric: Affect and judgment normal.  Bilateral breast exam:  Right lumpectomy.  No dominant masses palpable bilaterally.   Labs No visits with results within 3 Day(s) from this visit.  Latest known visit with results is:  Appointment on 02/20/2018  Component Date Value Ref Range Status  . WBC 02/20/2018 6.3  4.0 - 10.5 K/uL Final  . RBC 02/20/2018 4.09  3.87 - 5.11 MIL/uL Final  . Hemoglobin 02/20/2018 12.8  12.0 - 15.0 g/dL Final  . HCT 02/20/2018 39.3  36.0 - 46.0 % Final  . MCV 02/20/2018 96.1  78.0 - 100.0 fL Final  . MCH 02/20/2018 31.3  26.0 - 34.0 pg Final  . MCHC 02/20/2018  32.6  30.0 - 36.0 g/dL Final  . RDW 02/20/2018 13.1  11.5 -  15.5 % Final  . Platelets 02/20/2018 245  150 - 400 K/uL Final  . Neutrophils Relative % 02/20/2018 63  % Final  . Neutro Abs 02/20/2018 4.0  1.7 - 7.7 K/uL Final  . Lymphocytes Relative 02/20/2018 22  % Final  . Lymphs Abs 02/20/2018 1.4  0.7 - 4.0 K/uL Final  . Monocytes Relative 02/20/2018 9  % Final  . Monocytes Absolute 02/20/2018 0.6  0.1 - 1.0 K/uL Final  . Eosinophils Relative 02/20/2018 5  % Final  . Eosinophils Absolute 02/20/2018 0.3  0.0 - 0.7 K/uL Final  . Basophils Relative 02/20/2018 1  % Final  . Basophils Absolute 02/20/2018 0.0  0.0 - 0.1 K/uL Final   Performed at [facility], [address] [place], [place] [address]  . Sodium 02/20/2018 137  135 - 145 mmol/L Final  . Potassium 02/20/2018 3.9  3.5 - 5.1 mmol/L Final  . Chloride 02/20/2018 102  101 - 111 mmol/L Final  . CO2 02/20/2018 24  22 - 32 mmol/L Final  . Glucose, Bld 02/20/2018 96  65 - 99 mg/dL Final  . BUN 02/20/2018 18  6 - 20 mg/dL Final  . Creatinine, Ser 02/20/2018 0.88  0.44 - 1.00 mg/dL Final  . Calcium 02/20/2018 9.7  8.9 - 10.3 mg/dL Final  . Total Protein 02/20/2018 7.4  6.5 - 8.1 g/dL Final  . Albumin 02/20/2018 4.2  3.5 - 5.0 g/dL Final  . AST 02/20/2018 23  15 - 41 U/L Final  . ALT 02/20/2018 17  14 - 54 U/L Final  . Alkaline Phosphatase 02/20/2018 70  38 - 126 U/L Final  . Total Bilirubin 02/20/2018 0.7  0.3 - 1.2 mg/dL Final  . GFR calc non Af Amer 02/20/2018 >60  >60 mL/min Final  . GFR calc Af Amer 02/20/2018 >60  >60 mL/min Final   Comment: (NOTE) The eGFR has been calculated using the CKD EPI equation. This calculation has not been validated in all clinical situations. eGFR's persistently <60 mL/min signify possible Chronic Kidney Disease.   Georgiann Hahn gap 02/20/2018 11  5 - 15 Final   Performed at [facility], [address], [place], [place] [address]  . Vit D, 25-Hydroxy 02/20/2018 36.0  30.0 - 100.0 ng/mL Final   Comment:  (NOTE) Vitamin D deficiency has been defined by the [place] practice guideline as a level of serum 25-OH vitamin D less than 20 ng/mL (1,2). The Endocrine Society went on to further define vitamin D insufficiency as a level between 21 and 29 ng/mL (2). 1. IOM (Institute of Medicine). 2010. Dietary reference   intakes for calcium and D. [place]: The   [employer]. 2. Holick MF, [address], Bischoff-Ferrari HA, et al.   Evaluation, treatment, and prevention of vitamin D   deficiency: an Endocrine Society clinical practice   guideline. JCEM. 2011 Jul; 96(7):1911-30. Performed At: [facility] [place], [place] [id number] Rush Farmer MD [id number] Performed at [facility], [address], [place], [place] [address]      Pathology No orders of the defined types were placed in this encounter.      Zoila Shutter MD

## 2018-04-10 DIAGNOSIS — G4733 Obstructive sleep apnea (adult) (pediatric): Secondary | ICD-10-CM | POA: Diagnosis not present

## 2018-04-18 DIAGNOSIS — I10 Essential (primary) hypertension: Secondary | ICD-10-CM | POA: Diagnosis not present

## 2018-04-18 DIAGNOSIS — M5416 Radiculopathy, lumbar region: Secondary | ICD-10-CM | POA: Diagnosis not present

## 2018-04-18 DIAGNOSIS — M48062 Spinal stenosis, lumbar region with neurogenic claudication: Secondary | ICD-10-CM | POA: Diagnosis not present

## 2018-04-18 DIAGNOSIS — Z6839 Body mass index (BMI) 39.0-39.9, adult: Secondary | ICD-10-CM | POA: Diagnosis not present

## 2018-05-11 DIAGNOSIS — Z6839 Body mass index (BMI) 39.0-39.9, adult: Secondary | ICD-10-CM | POA: Diagnosis not present

## 2018-05-11 DIAGNOSIS — M5416 Radiculopathy, lumbar region: Secondary | ICD-10-CM | POA: Diagnosis not present

## 2018-05-11 DIAGNOSIS — I10 Essential (primary) hypertension: Secondary | ICD-10-CM | POA: Diagnosis not present

## 2018-05-11 DIAGNOSIS — M48062 Spinal stenosis, lumbar region with neurogenic claudication: Secondary | ICD-10-CM | POA: Diagnosis not present

## 2018-06-27 DIAGNOSIS — I10 Essential (primary) hypertension: Secondary | ICD-10-CM | POA: Diagnosis not present

## 2018-06-27 DIAGNOSIS — M461 Sacroiliitis, not elsewhere classified: Secondary | ICD-10-CM | POA: Diagnosis not present

## 2018-06-27 DIAGNOSIS — Z6838 Body mass index (BMI) 38.0-38.9, adult: Secondary | ICD-10-CM | POA: Diagnosis not present

## 2018-07-19 DIAGNOSIS — I10 Essential (primary) hypertension: Secondary | ICD-10-CM | POA: Diagnosis not present

## 2018-07-19 DIAGNOSIS — F325 Major depressive disorder, single episode, in full remission: Secondary | ICD-10-CM | POA: Diagnosis not present

## 2018-08-08 DIAGNOSIS — M461 Sacroiliitis, not elsewhere classified: Secondary | ICD-10-CM | POA: Diagnosis not present

## 2018-08-20 ENCOUNTER — Inpatient Hospital Stay (HOSPITAL_COMMUNITY): Payer: Medicare HMO | Attending: Hematology | Admitting: Hematology

## 2018-08-20 ENCOUNTER — Ambulatory Visit (HOSPITAL_COMMUNITY): Payer: Medicare HMO | Admitting: Hematology

## 2018-08-20 ENCOUNTER — Encounter (HOSPITAL_COMMUNITY): Payer: Self-pay | Admitting: Hematology

## 2018-08-20 ENCOUNTER — Other Ambulatory Visit: Payer: Self-pay

## 2018-08-20 ENCOUNTER — Inpatient Hospital Stay (HOSPITAL_COMMUNITY): Payer: Medicare HMO | Attending: Hematology

## 2018-08-20 ENCOUNTER — Ambulatory Visit (HOSPITAL_COMMUNITY): Payer: Medicare HMO

## 2018-08-20 ENCOUNTER — Other Ambulatory Visit (HOSPITAL_COMMUNITY): Payer: Medicare HMO

## 2018-08-20 VITALS — BP 141/56 | HR 71 | Temp 98.6°F | Resp 18 | Wt 220.5 lb

## 2018-08-20 DIAGNOSIS — Z7982 Long term (current) use of aspirin: Secondary | ICD-10-CM | POA: Insufficient documentation

## 2018-08-20 DIAGNOSIS — M8580 Other specified disorders of bone density and structure, unspecified site: Secondary | ICD-10-CM | POA: Insufficient documentation

## 2018-08-20 DIAGNOSIS — Z170 Estrogen receptor positive status [ER+]: Secondary | ICD-10-CM | POA: Insufficient documentation

## 2018-08-20 DIAGNOSIS — Z9989 Dependence on other enabling machines and devices: Secondary | ICD-10-CM | POA: Insufficient documentation

## 2018-08-20 DIAGNOSIS — Z79811 Long term (current) use of aromatase inhibitors: Secondary | ICD-10-CM

## 2018-08-20 DIAGNOSIS — I10 Essential (primary) hypertension: Secondary | ICD-10-CM | POA: Insufficient documentation

## 2018-08-20 DIAGNOSIS — C50411 Malignant neoplasm of upper-outer quadrant of right female breast: Secondary | ICD-10-CM | POA: Diagnosis not present

## 2018-08-20 DIAGNOSIS — Z923 Personal history of irradiation: Secondary | ICD-10-CM | POA: Diagnosis not present

## 2018-08-20 DIAGNOSIS — R5382 Chronic fatigue, unspecified: Secondary | ICD-10-CM | POA: Insufficient documentation

## 2018-08-20 DIAGNOSIS — K219 Gastro-esophageal reflux disease without esophagitis: Secondary | ICD-10-CM | POA: Insufficient documentation

## 2018-08-20 DIAGNOSIS — Z79899 Other long term (current) drug therapy: Secondary | ICD-10-CM | POA: Insufficient documentation

## 2018-08-20 DIAGNOSIS — Z90710 Acquired absence of both cervix and uterus: Secondary | ICD-10-CM | POA: Insufficient documentation

## 2018-08-20 DIAGNOSIS — M48061 Spinal stenosis, lumbar region without neurogenic claudication: Secondary | ICD-10-CM | POA: Diagnosis not present

## 2018-08-20 DIAGNOSIS — G4730 Sleep apnea, unspecified: Secondary | ICD-10-CM | POA: Insufficient documentation

## 2018-08-20 DIAGNOSIS — M1990 Unspecified osteoarthritis, unspecified site: Secondary | ICD-10-CM | POA: Insufficient documentation

## 2018-08-20 LAB — CBC WITH DIFFERENTIAL/PLATELET
Basophils Absolute: 0 10*3/uL (ref 0.0–0.1)
Basophils Relative: 0 %
EOS PCT: 4 %
Eosinophils Absolute: 0.2 10*3/uL (ref 0.0–0.7)
HEMATOCRIT: 37.9 % (ref 36.0–46.0)
Hemoglobin: 12.5 g/dL (ref 12.0–15.0)
LYMPHS ABS: 1.5 10*3/uL (ref 0.7–4.0)
LYMPHS PCT: 28 %
MCH: 32.1 pg (ref 26.0–34.0)
MCHC: 33 g/dL (ref 30.0–36.0)
MCV: 97.4 fL (ref 78.0–100.0)
Monocytes Absolute: 0.5 10*3/uL (ref 0.1–1.0)
Monocytes Relative: 10 %
NEUTROS ABS: 3.1 10*3/uL (ref 1.7–7.7)
Neutrophils Relative %: 58 %
PLATELETS: 206 10*3/uL (ref 150–400)
RBC: 3.89 MIL/uL (ref 3.87–5.11)
RDW: 13.3 % (ref 11.5–15.5)
WBC: 5.3 10*3/uL (ref 4.0–10.5)

## 2018-08-20 LAB — COMPREHENSIVE METABOLIC PANEL
ALK PHOS: 53 U/L (ref 38–126)
ALT: 19 U/L (ref 0–44)
AST: 21 U/L (ref 15–41)
Albumin: 4.2 g/dL (ref 3.5–5.0)
Anion gap: 9 (ref 5–15)
BILIRUBIN TOTAL: 0.8 mg/dL (ref 0.3–1.2)
BUN: 21 mg/dL (ref 8–23)
CALCIUM: 9.1 mg/dL (ref 8.9–10.3)
CHLORIDE: 104 mmol/L (ref 98–111)
CO2: 26 mmol/L (ref 22–32)
CREATININE: 0.78 mg/dL (ref 0.44–1.00)
Glucose, Bld: 85 mg/dL (ref 70–99)
Potassium: 4.4 mmol/L (ref 3.5–5.1)
Sodium: 139 mmol/L (ref 135–145)
TOTAL PROTEIN: 7 g/dL (ref 6.5–8.1)

## 2018-08-20 MED ORDER — DENOSUMAB 60 MG/ML ~~LOC~~ SOSY
60.0000 mg | PREFILLED_SYRINGE | Freq: Once | SUBCUTANEOUS | Status: AC
  Administered 2018-08-20: 60 mg via SUBCUTANEOUS
  Filled 2018-08-20: qty 1

## 2018-08-20 NOTE — Progress Notes (Signed)
Prolia given per orders. See MAR for details.   New Consent obtained today.

## 2018-08-20 NOTE — Assessment & Plan Note (Signed)
1. Stage Ia (T1b N0) right breast cancer: - 0.9 cm, ER/PR positive, status post lumpectomy in February 2016, Oncotype DX score of 4. - Status post XRT completed on 06/05/2015. -Anastrozole started in June 2016.  Tolerates it very well. - BCI test in September 2018 shows low likelihood of benefit of extended adjuvant therapy. - Mammogram on 02/20/2018 was BI-RADS Category 2. -Physical examination today did not reveal any suspicious masses. -She will come back in 6 months for follow-up.  2.  Osteopenia: - DEXA scan was done in June 2018 with a T score of -1.1. -She is continuing calcium and vitamin D twice daily.  She was started on Prolia 6 monthly injections on 07/29/2015.  She is tolerating them very well.  We plan to repeat DEXA scan sometime next year.

## 2018-08-20 NOTE — Patient Instructions (Signed)
[facility] at [facility] Discharge Instructions  Follow up in 6 months with labs    Thank you for choosing [facility] at [facility] to provide your oncology and hematology care.  To afford each patient quality time with our provider, please arrive at least 15 minutes before your scheduled appointment time.   If you have a lab appointment with the Cancer Center please come in thru the  Main Entrance and check in at the main information desk  You need to re-schedule your appointment should you arrive 10 or more minutes late.  We strive to give you quality time with our providers, and arriving late affects you and other patients whose appointments are after yours.  Also, if you no show three or more times for appointments you may be dismissed from the clinic at the providers discretion.     Again, thank you for choosing [facility].  Our hope is that these requests will decrease the amount of time that you wait before being seen by our physicians.       _____________________________________________________________  Should you have questions after your visit to [facility], please contact our office at [phone] between the hours of 8:00 a.m. and 4:30 p.m.  Voicemails left after 4:00 p.m. will not be returned until the following business day.  For prescription refill requests, have your pharmacy contact our office and allow 72 hours.    Cancer Center Support Programs:   > Cancer Support Group  2nd Tuesday of the month 1pm-2pm, Journey Room    

## 2018-08-20 NOTE — Progress Notes (Signed)
[place], [place] [address]   CLINIC:  Medical Oncology/Hematology  PCP:  Asencion Noble, MD [address] 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  ER 100%, PR 100%, Ki-67 18%, HER2 NEGATIVE    03/18/2015 Procedure    Breast, lumpectomy, right breast mass by Dr. Arnoldo Morale     03/18/2015 Pathology Results    - INVASIVE DUCTAL CARCINOMA, NOTTINGHAM COMBINED HISTOLOGIC GRADE II, 0.9 CM. - LOW GRADE DUCTAL CARCINOMA IN SITU. - RESECTION MARGINS, NEGATIVE FOR ATYPIA OR MALIGNANCY.    03/31/2015 Initial Diagnosis    Breast cancer    04/02/2015 Oncotype testing    Recurrence score of 4, low-risk.    04/30/2015 - 06/05/2015 Radiation Therapy    Right breast 50 Gy at 2 Gy/fraction x 25 fractions.  Dr. Pablo Ledger    06/08/2015 Imaging    DEXA-  BMD as determined from Femur Neck Right is 0.843 g/cm2 with a T-Score of -1.4. This patient is considered osteopenic according to [facility]) criteria.    06/10/2015 -  Anti-estrogen oral therapy    Arimidex    09/15/2017 Miscellaneous    BCI: 3.6% risk of late recurrence. Low likelihood of benefit from extended endocrine therapy.      CANCER STAGING: Cancer Staging Breast cancer [facility]) Staging form: Breast, AJCC 7th Edition - Clinical stage from 03/31/2015: Stage IA (T1b, N0, M0) - Signed by Baird Cancer, PA-C on 04/17/2016    INTERVAL HISTORY:  Ms. Pernice 77 y.o. female returns for routine follow-up for right breast cancer. Patient is taking her Arimidex daily with no issues or side effects. She reports her appetite at 100% and she is maintaining her weight well. Her energy level is at 75%.  She lives at home and performs all her own ADLs and activities. She denies any new lumps found. Denies any new pains. Denies any nausea, vomiting, or diarrhea. Denies any rash.    REVIEW OF SYSTEMS:  Review of Systems  All other systems reviewed and are negative.    PAST MEDICAL/SURGICAL HISTORY:  Past Medical History:  Diagnosis Date  . Breast cancer ([place]) 03/21/15   right  . Chronic fatigue   . Colitis, ischemic ([place]) 08/2007, 05/2011   Last colonoscopy/bx by Dr Rourk->(08/31/07) descending colon  . Depression   . Diverticulosis   . Enlarged thyroid   . Gait abnormality 10/18/2017  . GERD (gastroesophageal reflux disease)   . HTN (hypertension)   . Lumbar spinal stenosis 11/07/2017   Severe at L4-5, moderate at L3-4  . 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 OA (osteoarthritis)   . Obesity   . PONV (postoperative nausea and vomiting)   . Ptosis of eyelid, left   . Sleep apnea    cpap  . Spinal stenosis   . Tubular adenoma of colon 08/2007   Past Surgical History:  Procedure Laterality Date  . ABDOMINAL HYSTERECTOMY     partial  . BREAST CYST EXCISION     benign, left x2, right x1  . CATARACT EXTRACTION W/PHACO  12/01/2011   Procedure: CATARACT EXTRACTION PHACO AND INTRAOCULAR LENS PLACEMENT (IOC);  Surgeon: Tonny Branch;  Location: [facility]  ORS;  Service: Ophthalmology;  Laterality: Right;  CDE=14.87  . CATARACT EXTRACTION W/PHACO  02/06/2012   Procedure: CATARACT EXTRACTION PHACO AND INTRAOCULAR LENS PLACEMENT (IOC);  Surgeon: Tonny Branch, MD;  Location: [facility] ORS;  Service: Ophthalmology;  Laterality: Left;  CDE 13.00  . COLONOSCOPY  Sept 2008   Last colonoscopy/bx by Dr Rourk->(08/31/07) descending colon TUBULAR ADENOMA  . COLONOSCOPY  10/25/2012   Procedure: COLONOSCOPY;  Surgeon: Daneil Dolin, MD;  Location: [facility] ENDO SUITE;  Service: Endoscopy;  Laterality: N/A;  10:15-changed to 10:30 Darius Bump notified  . PARTIAL MASTECTOMY WITH NEEDLE LOCALIZATION AND AXILLARY SENTINEL LYMPH NODE BX Right  03/18/2015   Procedure: PARTIAL MASTECTOMY AFTER NEEDLE LOCALIZATION AND AXILLARY SENTINEL LYMPH NODE BX;  Surgeon: Aviva Signs Md, MD;  Location: [facility] ORS;  Service: General;  Laterality: Right;  . PTOSIS REPAIR Left 05/11/2017   Procedure: INTERNAL PTOSIS REPAIR OF LEFT EYELID;  Surgeon: Clista Bernhardt, MD;  Location: [facility];  Service: Ophthalmology;  Laterality: Left;  . TUBAL LIGATION  1970   hysterectomy     SOCIAL HISTORY:  Social History   Socioeconomic History  . Marital status: Widowed    Spouse name: Not on file  . Number of children: 3  . Years of education: 32  . Highest education level: Not on file  Occupational History  . Occupation: Lab Tech-retired  Social Needs  . Financial resource strain: Not on file  . Food insecurity:    Worry: Not on file    Inability: Not on file  . Transportation needs:    Medical: Not on file    Non-medical: Not on file  Tobacco Use  . Smoking status: Never Smoker  . Smokeless tobacco: Never Used  Substance and Sexual Activity  . Alcohol use: No  . Drug use: No  . Sexual activity: Never    Birth control/protection: Surgical  Lifestyle  . Physical activity:    Days per week: Not on file    Minutes per session: Not on file  . Stress: Not on file  Relationships  . Social connections:    Talks on phone: Not on file    Gets together: Not on file    Attends religious service: Not on file    Active member of club or organization: Not on file    Attends meetings of clubs or organizations: Not on file    Relationship status: Not on file  . Intimate partner violence:    Fear of current or ex partner: Not on file    Emotionally abused: Not on file    Physically abused: Not on file    Forced sexual activity: Not on file  Other Topics Concern  . Not on file  Social History Narrative   Lives alone   Caffeine use: less than 8 oz per week   Right handed     FAMILY HISTORY:  Family History  Problem Relation Age of Onset  .  Cancer Brother 46  . Coronary artery disease Brother   . Diabetes Sister   . Heart disease Father   . Heart disease Brother   . Anesthesia problems Neg Hx   . Hypotension Neg Hx   . Pseudochol deficiency Neg Hx   . Malignant hyperthermia Neg Hx   . Colon cancer Neg Hx     CURRENT MEDICATIONS:  Outpatient Encounter Medications as of 08/20/2018  Medication Sig  . acetaminophen (TYLENOL ARTHRITIS PAIN) 650 MG CR tablet Take 1,300 mg by mouth 2 (  two) times daily.   Marland Kitchen amLODipine (NORVASC) 10 MG tablet Take 10 mg by mouth every evening.   Marland Kitchen anastrozole (ARIMIDEX) 1 MG tablet TAKE 1 TABLET EVERY DAY  . Artificial Tear Solution (SOOTHE XP OP) Place 1 drop into both eyes 2 (two) times daily.  Marland Kitchen buPROPion (WELLBUTRIN SR) 150 MG 12 hr tablet Take 150 mg by mouth 2 (two) times daily.   . Calcium-Vitamin D (CALTRATE 600 PLUS-VIT D PO) Take 1 tablet by mouth daily.  Marland Kitchen FLUoxetine (PROZAC) 20 MG capsule Take 60 mg by mouth daily.   . fluticasone (FLONASE) 50 MCG/ACT nasal spray Place 2 sprays into both nostrils daily.   Marland Kitchen loratadine (CLARITIN) 10 MG tablet Take 10 mg by mouth daily.    Marland Kitchen losartan-hydrochlorothiazide (HYZAAR) 100-25 MG per tablet Take 1 tablet by mouth daily.  . pantoprazole (PROTONIX) 40 MG tablet TAKE 1 TABLET BY MOUTH TWICE DAILY (Patient taking differently: TAKE 1 TABLET BY MOUTH DAILY 30 MINUTES BEFORE BREAKFAST)  . simvastatin (ZOCOR) 10 MG tablet Take 10 mg by mouth at bedtime.   . promethazine (PHENERGAN) 25 MG tablet Take 0.5-1 tablets (12.5-25 mg total) by mouth every 6 (six) hours as needed for nausea or vomiting. (Patient not taking: Reported on 08/20/2018)  . [DISCONTINUED] aspirin EC 81 MG tablet Take 81 mg by mouth daily.    . [EXPIRED] denosumab (PROLIA) injection 60 mg    No facility-administered encounter medications on file as of 08/20/2018.     ALLERGIES:  Allergies  Allergen Reactions  . Bactrim [Sulfamethoxazole-Trimethoprim] Nausea Only     PHYSICAL EXAM:    ECOG Performance status: 1  Vitals:   08/20/18 1440  BP: (!) 141/56  Pulse: 71  Resp: 18  Temp: 98.6 F (37 C)  SpO2: 100%   Filed Weights   08/20/18 1440  Weight: 220 lb 8 oz (100 kg)    Physical Exam  Constitutional: She is oriented to person, place, and time. She appears well-developed and well-nourished.  Neurological: She is alert and oriented to person, place, and time.  Skin: Skin is warm and dry.  Psychiatric: She has a normal mood and affect. Her behavior is normal. Judgment and thought content normal.  Breasts: Right breast lumpectomy site in the lower outer quadrant is unchanged.  No palpable mass in bilateral breast.  No palpable axillary adenopathy. Abdomen: Soft, nontender with no palpable organomegaly.  LABORATORY DATA:  I have reviewed the labs as listed.  CBC    Component Value Date/Time   WBC 5.3 08/20/2018 1041   RBC 3.89 08/20/2018 1041   HGB 12.5 08/20/2018 1041   HCT 37.9 08/20/2018 1041   PLT 206 08/20/2018 1041   MCV 97.4 08/20/2018 1041   MCH 32.1 08/20/2018 1041   MCHC 33.0 08/20/2018 1041   RDW 13.3 08/20/2018 1041   LYMPHSABS 1.5 08/20/2018 1041   MONOABS 0.5 08/20/2018 1041   EOSABS 0.2 08/20/2018 1041   BASOSABS 0.0 08/20/2018 1041   CMP Latest Ref Rng & Units 08/20/2018 02/20/2018 09/01/2017  Glucose 70 - 99 mg/dL 85 96 95  BUN 8 - 23 mg/dL 21 18 16   Creatinine 0.44 - 1.00 mg/dL 0.78 0.88 0.61  Sodium 135 - 145 mmol/L 139 137 138  Potassium 3.5 - 5.1 mmol/L 4.4 3.9 3.8  Chloride 98 - 111 mmol/L 104 102 103  CO2 22 - 32 mmol/L 26 24 27   Calcium 8.9 - 10.3 mg/dL 9.1 9.7 9.1  Total Protein 6.5 - 8.1 g/dL 7.0 7.4  6.9  Total Bilirubin 0.3 - 1.2 mg/dL 0.8 0.7 0.7  Alkaline Phos 38 - 126 U/L 53 70 54  AST 15 - 41 U/L 21 23 23   ALT 0 - 44 U/L 19 17 17        DIAGNOSTIC IMAGING:  I have reviewed her mammogram dated 02/20/2018.     ASSESSMENT & PLAN:   Breast cancer ([place]) 1. Stage Ia (T1b N0) right breast cancer: - 0.9 cm, ER/PR  positive, status post lumpectomy in February 2016, Oncotype DX score of 4. - Status post XRT completed on 06/05/2015. -Anastrozole started in June 2016.  Tolerates it very well. - BCI test in September 2018 shows low likelihood of benefit of extended adjuvant therapy. - Mammogram on 02/20/2018 was BI-RADS Category 2. -Physical examination today did not reveal any suspicious masses. -She will come back in 6 months for follow-up.  2.  Osteopenia: - DEXA scan was done in June 2018 with a T score of -1.1. -She is continuing calcium and vitamin D twice daily.  She was started on Prolia 6 monthly injections on 07/29/2015.  She is tolerating them very well.  We plan to repeat DEXA scan sometime next year.      Orders placed this encounter:  Orders Placed This Encounter  Procedures  . CBC with Differential/Platelet  . Comprehensive metabolic panel      Derek Jack, MD [place] [phone]

## 2018-08-21 LAB — VITAMIN D 25 HYDROXY (VIT D DEFICIENCY, FRACTURES): Vit D, 25-Hydroxy: 29.6 ng/mL — ABNORMAL LOW (ref 30.0–100.0)

## 2018-09-11 DIAGNOSIS — M461 Sacroiliitis, not elsewhere classified: Secondary | ICD-10-CM | POA: Diagnosis not present

## 2018-09-13 ENCOUNTER — Other Ambulatory Visit: Payer: Self-pay

## 2018-09-13 ENCOUNTER — Emergency Department (HOSPITAL_COMMUNITY)
Admission: EM | Admit: 2018-09-13 | Discharge: 2018-09-13 | Disposition: A | Discharge status: Home / Self Care | Payer: Medicare HMO | Attending: Emergency Medicine | Admitting: Emergency Medicine

## 2018-09-13 ENCOUNTER — Encounter (HOSPITAL_COMMUNITY): Payer: Self-pay | Admitting: Emergency Medicine

## 2018-09-13 ENCOUNTER — Emergency Department (HOSPITAL_COMMUNITY): Payer: Medicare HMO

## 2018-09-13 DIAGNOSIS — Z79899 Other long term (current) drug therapy: Secondary | ICD-10-CM | POA: Diagnosis not present

## 2018-09-13 DIAGNOSIS — Z853 Personal history of malignant neoplasm of breast: Secondary | ICD-10-CM | POA: Diagnosis not present

## 2018-09-13 DIAGNOSIS — M25562 Pain in left knee: Secondary | ICD-10-CM | POA: Insufficient documentation

## 2018-09-13 DIAGNOSIS — S8992XA Unspecified injury of left lower leg, initial encounter: Secondary | ICD-10-CM | POA: Diagnosis not present

## 2018-09-13 DIAGNOSIS — I10 Essential (primary) hypertension: Secondary | ICD-10-CM | POA: Diagnosis not present

## 2018-09-13 NOTE — ED Notes (Signed)
EDP at bedside  

## 2018-09-13 NOTE — ED Triage Notes (Signed)
Pt states unable to bend knee without left lateral pain. Pt ambulatory, denies injury.

## 2018-09-13 NOTE — ED Provider Notes (Signed)
Medical screening examination/treatment/procedure(s) were conducted as a shared visit with non-physician practitioner(s) and myself.  I personally evaluated the patient during the encounter.  Atraumatic knee pain.  Likely secondary to twisting it could be a small meniscal tear or ligamentous injury.  No warmth, effusion or erythema to suggest infection.  Able to range it fully.  Neurovascularly intact.  Able to straight leg raise.  Conservative supportive care with Ortho follow-up.   None     Kinzy Weyers, Corene Cornea, MD 09/13/18 2026

## 2018-09-13 NOTE — ED Notes (Signed)
Patient transported to X-ray 

## 2018-09-13 NOTE — Discharge Instructions (Addendum)
In addition to your ibuprofen and Tylenol in morning and night please take a dose in the middle of the day also.  You have been given a knee sleeve for your comfort, and to help support the knee.  Your x-rays did not show any fracture dislocation.  They do show multiple areas of degenerative changes including arthritis.  If this does not hurt to improve in the next few days, you start developing fevers, worsening swelling, redness over the area or have any concerns please seek additional medical care and evaluation.

## 2018-09-13 NOTE — ED Provider Notes (Signed)
[facility] EMERGENCY DEPARTMENT Provider Note   CSN: [id number] Arrival date & time: 09/13/18  1609     History   Chief Complaint Chief Complaint  Patient presents with  . Knee Pain    HPI Crystal [name] is a 77 y.o. female past medical history of right-sided breast cancer, lumbar spinal stenosis, who presents today for evaluation of left knee pain.  She reports that a few hours ago she was walking and had a sudden onset of pain in her left knee.  She denies any specific injury, does not feel like she twisted it or stepped wrong.  She says that initially she was not able to bend her knee, however that does appear to be slightly improving.  No recent fevers.  She regularly takes both ibuprofen and Tylenol to help manage her chronic back pain.  HPI  Past Medical History:  Diagnosis Date  . Breast cancer ([place]) 03/21/15   right  . Chronic fatigue   . Colitis, ischemic ([place]) 08/2007, 05/2011   Last colonoscopy/bx by Dr Rourk->(08/31/07) descending colon  . Depression   . Diverticulosis   . Enlarged thyroid   . Gait abnormality 10/18/2017  . GERD (gastroesophageal reflux disease)   . HTN (hypertension)   . Lumbar spinal stenosis 11/07/2017   Severe at L4-5, moderate at L3-4  . 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 OA (osteoarthritis)   . Obesity   . PONV (postoperative nausea and vomiting)   . Ptosis of eyelid, left   . Sleep apnea    cpap  . Spinal stenosis   . Tubular adenoma of colon 08/2007    Patient Active Problem List   Diagnosis Date Noted  . Lumbar spinal stenosis 11/07/2017  . Gait abnormality 10/18/2017  . 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 Osteopenia determined by x-ray 07/23/2015  . Breast cancer ([place]) 03/31/2015  . Colitis, ischemic ([place]) 06/27/2011  . Tubular adenoma 06/27/2011    Past Surgical History:  Procedure Laterality Date  . ABDOMINAL HYSTERECTOMY     partial  . BREAST CYST EXCISION     benign, left x2, right x1  . CATARACT EXTRACTION W/PHACO  12/01/2011     Procedure: CATARACT EXTRACTION PHACO AND INTRAOCULAR LENS PLACEMENT (IOC);  Surgeon: Tonny Branch;  Location: [facility] ORS;  Service: Ophthalmology;  Laterality: Right;  CDE=14.87  . CATARACT EXTRACTION W/PHACO  02/06/2012   Procedure: CATARACT EXTRACTION PHACO AND INTRAOCULAR LENS PLACEMENT (IOC);  Surgeon: Tonny Branch, MD;  Location: [facility] ORS;  Service: Ophthalmology;  Laterality: Left;  CDE 13.00  . COLONOSCOPY  Sept 2008   Last colonoscopy/bx by Dr Rourk->(08/31/07) descending colon TUBULAR ADENOMA  . COLONOSCOPY  10/25/2012   Procedure: COLONOSCOPY;  Surgeon: Daneil Dolin, MD;  Location: [facility] END[facility];  Service: Endoscopy;  Laterality: N/A;  10:15-changed to 10:30 Darius Bump notified  . PARTIAL MASTECTOMY WITH NEEDLE LOCALIZATION AND AXILLARY SENTINEL LYMPH NODE BX Right 03/18/2015   Procedure: PARTIAL MASTECTOMY AFTER NEEDLE LOCALIZATION AND AXILLARY SENTINEL LYMPH NODE BX;  Surgeon: Aviva Signs Md, MD;  Location: [facility] ORS;  Service: General;  Laterality: Right;  . PTOSIS REPAIR Left 05/11/2017   Procedure: INTERNAL PTOSIS REPAIR OF LEFT EYELID;  Surgeon: Clista Bernhardt, MD;  Location: [facility];  Service: Ophthalmology;  Laterality: Left;  . TUBAL LIGATION  1970   hysterectomy     OB History   None      Home Medications    Prior to Admission medications   Medication Sig Start Date End Date  Taking? Authorizing Provider  acetaminophen (TYLENOL ARTHRITIS PAIN) 650 MG CR tablet Take 1,300 mg by mouth 2 (two) times daily.     [provider]  amLODipine (NORVASC) 10 MG tablet Take 10 mg by mouth every evening.  10/02/12   [provider]  anastrozole (ARIMIDEX) 1 MG tablet TAKE 1 TABLET EVERY DAY 03/13/18   Holley Bouche, NP  Artificial Tear Solution (SOOTHE XP OP) Place 1 drop into both eyes 2 (two) times daily.    [provider]  buPROPion (WELLBUTRIN SR) 150 MG 12 hr tablet Take 150 mg by mouth 2 (two) times daily.  06/02/11   [provider]   Calcium-Vitamin D (CALTRATE 600 PLUS-VIT D PO) Take 1 tablet by mouth daily.    [provider]  FLUoxetine (PROZAC) 20 MG capsule Take 60 mg by mouth daily.  06/23/11   [provider]  fluticasone (FLONASE) 50 MCG/ACT nasal spray Place 2 sprays into both nostrils daily.     [provider]  loratadine (CLARITIN) 10 MG tablet Take 10 mg by mouth daily.      [provider]  losartan-hydrochlorothiazide (HYZAAR) 100-25 MG per tablet Take 1 tablet by mouth daily.    [provider]  pantoprazole (PROTONIX) 40 MG tablet TAKE 1 TABLET BY MOUTH TWICE DAILY Patient taking differently: TAKE 1 TABLET BY MOUTH DAILY 30 MINUTES BEFORE BREAKFAST 12/14/15   Mannam, Praveen, MD  promethazine (PHENERGAN) 25 MG tablet Take 0.5-1 tablets (12.5-25 mg total) by mouth every 6 (six) hours as needed for nausea or vomiting. Patient not taking: Reported on 08/20/2018 05/09/17   Evalee Jefferson, PA-C  simvastatin (ZOCOR) 10 MG tablet Take 10 mg by mouth at bedtime.     [provider]    Family History Family History  Problem Relation Age of Onset  . Cancer Brother 34  . Coronary artery disease Brother   . Diabetes Sister   . Heart disease Father   . Heart disease Brother   . Anesthesia problems Neg Hx   . Hypotension Neg Hx   . Pseudochol deficiency Neg Hx   . Malignant hyperthermia Neg Hx   . Colon cancer Neg Hx     Social History Social History   Tobacco Use  . Smoking status: Never Smoker  . Smokeless tobacco: Never Used  Substance Use Topics  . Alcohol use: No  . Drug use: No     Allergies   Bactrim [sulfamethoxazole-trimethoprim]   Review of Systems Review of Systems  Constitutional: Negative for chills and fever.  Musculoskeletal: Positive for joint swelling.       Pain in left knee  Neurological: Negative for weakness and numbness.  All other systems reviewed and are negative.    Physical Exam Updated Vital Signs BP (!) 129/55  (BP Location: Left Arm)   Pulse 80   Temp 98.4 F (36.9 C) (Oral)   Resp 16   SpO2 95%   Physical Exam  Constitutional: She appears well-developed and well-nourished. No distress.  HENT:  Head: Normocephalic and atraumatic.  Eyes: Conjunctivae are normal. Right eye exhibits no discharge. Left eye exhibits no discharge. No scleral icterus.  Neck: Normal range of motion.  Cardiovascular: Normal rate, regular rhythm and intact distal pulses.  Left foot 2+ DP/PT pulses.  Pulmonary/Chest: Effort normal. No stridor. No respiratory distress.  Abdominal: She exhibits no distension.  Musculoskeletal: She exhibits no edema or deformity.  Patient has 5/5 strength with bilateral ankles through ankle dorsiflexion  and plantar flexion.  Left knee is not obviously swollen when compared to right.  She has mild crepitus over patella with knee range of motion.  Active range of motion she is able to flex to approximately 70 degrees before she begins having pain.  Pain is unchanged with passive range of motion.  Pain is primarily on the lateral aspect of her leg.    Neurological: She is alert. She exhibits normal muscle tone.  Skin: Skin is warm and dry. She is not diaphoretic.  Left knee is not red, abnormally warm, indurated.  There are no wounds.  There is a large area of varicose superficial veins on the left lateral leg, which patient states is been there for many years and are unchanged.  Superficially the left knee is not tender to palpation, even over the varicose veins.  Psychiatric: She has a normal mood and affect. Her behavior is normal.  Nursing note and vitals reviewed.    ED Treatments / Results  Labs (all labs ordered are listed, but only abnormal results are displayed) Labs Reviewed - No data to display  EKG None  Radiology Dg Knee Complete 4 Views Left  Result Date: 09/13/2018 CLINICAL DATA:  Injured knee exiting car EXAM: LEFT KNEE - COMPLETE 4+ VIEW COMPARISON:  October 31, 2012  FINDINGS: Frontal, bilateral oblique, and lateral views obtained. There is no fracture or dislocation. There is no appreciable joint effusion. There is marked narrowing medially. There is moderate narrowing in the patellofemoral joint. There is spurring in all compartments. No erosive change. IMPRESSION: Osteoarthritic change, most marked medially. Spurring in all compartments. There appears to be slight progression of osteoarthritic change overall compared to the 2013 study. No evident fracture or dislocation.  No joint effusion. Electronically Signed   By: Lowella Grip III M.D.   On: 09/13/2018 17:41    Procedures Procedures (including critical care time)  Medications Ordered in ED Medications - No data to display   Initial Impression / Assessment and Plan / ED Course  I have reviewed the triage vital signs and the nursing notes.  Pertinent labs & imaging results that were available during my care of the patient were reviewed by me and considered in my medical decision making (see chart for details).    Patient presents today for evaluation of left knee pain which developed a few hours ago.  She has not tried any interventions prior to arrival, denies any specific trauma or injury, no fevers or chills.  She is ambulatory and able to bear full weight on the knee.  History and physical exam are not consistent with gout or septic arthritis at this time.  X-rays were obtained showing diffuse degenerative changes without evidence of acute fracture or other abnormalities.  Recommended patient adding a midday dose of ibuprofen and Tylenol, she is given an Ace wrap.  This patient was seen as a shared visit with Dr. Dayna Barker.  Return precautions were discussed with patient who states their understanding.  At the time of discharge patient denied any unaddressed complaints or concerns.  Patient is agreeable for discharge home.   Final Clinical Impressions(s) / ED Diagnoses   Final diagnoses:  Acute  pain of left knee    ED Discharge Orders    None       Ollen Gross 09/13/18 1752    Mesner, Corene Cornea, MD 09/13/18 2025

## 2018-10-04 DIAGNOSIS — M5416 Radiculopathy, lumbar region: Secondary | ICD-10-CM | POA: Diagnosis not present

## 2018-10-04 DIAGNOSIS — M4317 Spondylolisthesis, lumbosacral region: Secondary | ICD-10-CM | POA: Diagnosis not present

## 2018-10-04 DIAGNOSIS — M48062 Spinal stenosis, lumbar region with neurogenic claudication: Secondary | ICD-10-CM | POA: Diagnosis not present

## 2018-10-04 DIAGNOSIS — M461 Sacroiliitis, not elsewhere classified: Secondary | ICD-10-CM | POA: Diagnosis not present

## 2018-10-17 DIAGNOSIS — G4733 Obstructive sleep apnea (adult) (pediatric): Secondary | ICD-10-CM | POA: Diagnosis not present

## 2018-10-23 ENCOUNTER — Encounter: Payer: Self-pay | Admitting: Pulmonary Disease

## 2018-10-23 ENCOUNTER — Ambulatory Visit: Payer: Medicare HMO | Admitting: Pulmonary Disease

## 2018-10-23 VITALS — BP 128/78 | HR 93 | Ht 63.0 in | Wt 218.6 lb

## 2018-10-23 DIAGNOSIS — R05 Cough: Secondary | ICD-10-CM | POA: Diagnosis not present

## 2018-10-23 DIAGNOSIS — R053 Chronic cough: Secondary | ICD-10-CM

## 2018-10-23 DIAGNOSIS — Z23 Encounter for immunization: Secondary | ICD-10-CM

## 2018-10-23 DIAGNOSIS — G4733 Obstructive sleep apnea (adult) (pediatric): Secondary | ICD-10-CM | POA: Diagnosis not present

## 2018-10-23 NOTE — Progress Notes (Signed)
Crystal [name]    [id number]    [birth year]  Primary Care Physician:Fagan, Carloyn Manner, MD  Referring Physician: Asencion Noble, MD [address] [place], [place] [address]  Chief complaint:   Follow up for  Chronic cough Post nasal drip OSA on CPAP 5-20  HPI: Crystal [name] is a 77 year old with upper airway cough, GERD, OSA  Symptoms improved with steroid nasal spray, antihistamine and Protonix.  She was initially on Protonix 40 mg twice daily which is subsequently reduced to once daily. She is on Cozaar for her blood pressure medication. But she is unable to state if her symptoms of cough began when Cozaar was started.   She has history of OSA on CPAP. No history of allergies or exposures. She is a never smoker and does not drink alcohol.   Interim history: States that cough is slightly worse now, nonproductive in nature.  She attributes this to allergies Continues to use the CPAP without issue.  Feels that the CPAP pressure is sometimes too high.   Outpatient Encounter Medications as of 10/23/2018  Medication Sig  . acetaminophen (TYLENOL ARTHRITIS PAIN) 650 MG CR tablet Take 1,300 mg by mouth 2 (two) times daily.   Marland Kitchen amLODipine (NORVASC) 10 MG tablet Take 10 mg by mouth every evening.   Marland Kitchen anastrozole (ARIMIDEX) 1 MG tablet TAKE 1 TABLET EVERY DAY  . Artificial Tear Solution (SOOTHE XP OP) Place 1 drop into both eyes 2 (two) times daily.  Marland Kitchen buPROPion (WELLBUTRIN SR) 150 MG 12 hr tablet Take 150 mg by mouth 2 (two) times daily.   . Calcium-Vitamin D (CALTRATE 600 PLUS-VIT D PO) Take 1 tablet by mouth daily.  Marland Kitchen FLUoxetine (PROZAC) 20 MG capsule Take 60 mg by mouth daily.   . fluticasone (FLONASE) 50 MCG/ACT nasal spray Place 2 sprays into both nostrils daily.   Marland Kitchen loratadine (CLARITIN) 10 MG tablet Take 10 mg by mouth daily.    Marland Kitchen losartan-hydrochlorothiazide (HYZAAR) 100-25 MG per tablet Take 1 tablet by mouth daily.  . pantoprazole (PROTONIX) 40 MG tablet TAKE 1 TABLET BY  MOUTH TWICE DAILY (Patient taking differently: TAKE 1 TABLET BY MOUTH DAILY 30 MINUTES BEFORE BREAKFAST)  . promethazine (PHENERGAN) 25 MG tablet Take 0.5-1 tablets (12.5-25 mg total) by mouth every 6 (six) hours as needed for nausea or vomiting.  . simvastatin (ZOCOR) 10 MG tablet Take 10 mg by mouth at bedtime.    No facility-administered encounter medications on file as of 10/23/2018.    Physical Exam: Blood 5-20 128/78, pulse 93, height 5\' 3"  (1.6 m), weight 218 lb 9.6 oz (99.2 kg), SpO2 98 %. Gen:      No acute distress HEENT:  EOMI, sclera anicteric Neck:     No masses; no thyromegaly Lungs:    Clear to auscultation bilaterally; normal respiratory effort CV:         Regular rate and rhythm; no murmurs Abd:      + bowel sounds; soft, non-tender; no palpable masses, no distension Ext:    No edema; adequate peripheral perfusion Skin:      Warm and dry; no rash Neuro: alert and oriented x 3 Psych: normal mood and affect  Data Reviewed: Imaging CXR 04/28/16 Stable linear scarring of the lingula compared to 2016. I have reviewed her images personally.   PFTs 04/25/16 FVC 3 [105%), FEV1 2.5 to [180%), F/F 84, TLC 190%, DLCO 81%. No obstruction, restriction. Minimal diffusion defect.  FENO 05/17/17- 5  Labs CBC 08/20/2018-WBC 5.3, eos 4%, absolute eosinophil count 212  Sleep  PSG 12/19/15 Moderate sleep apnea  CPAP download 09/21/2018- 10/20/2018 87% compliant Set pressure 5-20 Average pressure delivered 11.6, residual AHI 0.7  Assessment:  Upper airway cough syndrome Cough secondary to allergic rhinitis, postnasal drip with contribution of GERD. Suspicion for asthma is low and FENO is normal Not on any long-term inhalers. Continue Flonase for rhinitis, postnasal drip.  She can try chlorpheniramine over-the-counter for a couple of weeks since she has worsening cough over the past few months. Continue Protonix for GERD   OSA. Compliant with CPAP.  Auto download reviewed with  good response She feels her pressure is high.  Will change the auto setting to 5-15  Plan/Recommendations: - Continue Flonase - Try Chlor-Trimeton 8 mg 3 times daily for 1 to 2 weeks - Continue Protonix - Change AutoSet CPAP to 5-15  Marshell Garfinkel MD [facility] 10/23/2018, 10:48 AM  CC: Asencion Noble, MD

## 2018-10-23 NOTE — Patient Instructions (Signed)
We will give a flu vaccine today For the cough you can use Chlor-Trimeton 8 mg 3 times daily for 1 to 2 weeks.  Do not use loratadine while using this medication Continue Protonix/pantoprazole for acid reflux We will change your AutoSet setting to 5-15 since the pressure seems to be high  Follow-up in 1 year.

## 2018-11-15 DIAGNOSIS — Z853 Personal history of malignant neoplasm of breast: Secondary | ICD-10-CM | POA: Diagnosis not present

## 2018-11-15 DIAGNOSIS — I10 Essential (primary) hypertension: Secondary | ICD-10-CM | POA: Diagnosis not present

## 2019-02-21 ENCOUNTER — Inpatient Hospital Stay (HOSPITAL_COMMUNITY): Payer: Medicare HMO | Attending: Hematology

## 2019-02-21 ENCOUNTER — Other Ambulatory Visit: Payer: Self-pay

## 2019-02-21 ENCOUNTER — Encounter (HOSPITAL_COMMUNITY): Payer: Self-pay | Admitting: Internal Medicine

## 2019-02-21 ENCOUNTER — Inpatient Hospital Stay (HOSPITAL_BASED_OUTPATIENT_CLINIC_OR_DEPARTMENT_OTHER): Payer: Medicare HMO | Admitting: Internal Medicine

## 2019-02-21 ENCOUNTER — Inpatient Hospital Stay (HOSPITAL_COMMUNITY): Payer: Medicare HMO

## 2019-02-21 VITALS — BP 146/65 | HR 96 | Temp 98.5°F | Resp 18 | Wt 222.0 lb

## 2019-02-21 DIAGNOSIS — M8580 Other specified disorders of bone density and structure, unspecified site: Secondary | ICD-10-CM | POA: Insufficient documentation

## 2019-02-21 DIAGNOSIS — C50411 Malignant neoplasm of upper-outer quadrant of right female breast: Secondary | ICD-10-CM | POA: Diagnosis not present

## 2019-02-21 DIAGNOSIS — Z79811 Long term (current) use of aromatase inhibitors: Secondary | ICD-10-CM

## 2019-02-21 DIAGNOSIS — K219 Gastro-esophageal reflux disease without esophagitis: Secondary | ICD-10-CM

## 2019-02-21 DIAGNOSIS — M1990 Unspecified osteoarthritis, unspecified site: Secondary | ICD-10-CM | POA: Diagnosis not present

## 2019-02-21 DIAGNOSIS — M48061 Spinal stenosis, lumbar region without neurogenic claudication: Secondary | ICD-10-CM | POA: Diagnosis not present

## 2019-02-21 DIAGNOSIS — G4730 Sleep apnea, unspecified: Secondary | ICD-10-CM

## 2019-02-21 DIAGNOSIS — Z170 Estrogen receptor positive status [ER+]: Secondary | ICD-10-CM

## 2019-02-21 DIAGNOSIS — E559 Vitamin D deficiency, unspecified: Secondary | ICD-10-CM

## 2019-02-21 DIAGNOSIS — Z79899 Other long term (current) drug therapy: Secondary | ICD-10-CM | POA: Insufficient documentation

## 2019-02-21 DIAGNOSIS — Z780 Asymptomatic menopausal state: Secondary | ICD-10-CM | POA: Insufficient documentation

## 2019-02-21 DIAGNOSIS — Z9989 Dependence on other enabling machines and devices: Secondary | ICD-10-CM | POA: Diagnosis not present

## 2019-02-21 DIAGNOSIS — I10 Essential (primary) hypertension: Secondary | ICD-10-CM | POA: Insufficient documentation

## 2019-02-21 LAB — COMPREHENSIVE METABOLIC PANEL
ALT: 17 U/L (ref 0–44)
AST: 25 U/L (ref 15–41)
Albumin: 4.3 g/dL (ref 3.5–5.0)
Alkaline Phosphatase: 66 U/L (ref 38–126)
Anion gap: 9 (ref 5–15)
BUN: 19 mg/dL (ref 8–23)
CHLORIDE: 105 mmol/L (ref 98–111)
CO2: 24 mmol/L (ref 22–32)
CREATININE: 0.87 mg/dL (ref 0.44–1.00)
Calcium: 9.5 mg/dL (ref 8.9–10.3)
Glucose, Bld: 121 mg/dL — ABNORMAL HIGH (ref 70–99)
Potassium: 4 mmol/L (ref 3.5–5.1)
Sodium: 138 mmol/L (ref 135–145)
Total Bilirubin: 0.6 mg/dL (ref 0.3–1.2)
Total Protein: 7.4 g/dL (ref 6.5–8.1)

## 2019-02-21 LAB — CBC WITH DIFFERENTIAL/PLATELET
ABS IMMATURE GRANULOCYTES: 0.01 10*3/uL (ref 0.00–0.07)
BASOS PCT: 1 %
Basophils Absolute: 0 10*3/uL (ref 0.0–0.1)
EOS ABS: 0.3 10*3/uL (ref 0.0–0.5)
Eosinophils Relative: 5 %
HCT: 39.4 % (ref 36.0–46.0)
Hemoglobin: 12.6 g/dL (ref 12.0–15.0)
Immature Granulocytes: 0 %
Lymphocytes Relative: 33 %
Lymphs Abs: 1.9 10*3/uL (ref 0.7–4.0)
MCH: 31 pg (ref 26.0–34.0)
MCHC: 32 g/dL (ref 30.0–36.0)
MCV: 96.8 fL (ref 80.0–100.0)
MONOS PCT: 11 %
Monocytes Absolute: 0.6 10*3/uL (ref 0.1–1.0)
Neutro Abs: 3 10*3/uL (ref 1.7–7.7)
Neutrophils Relative %: 50 %
PLATELETS: 249 10*3/uL (ref 150–400)
RBC: 4.07 MIL/uL (ref 3.87–5.11)
RDW: 12.7 % (ref 11.5–15.5)
WBC: 5.8 10*3/uL (ref 4.0–10.5)
nRBC: 0 % (ref 0.0–0.2)

## 2019-02-21 MED ORDER — DENOSUMAB 60 MG/ML ~~LOC~~ SOSY
60.0000 mg | PREFILLED_SYRINGE | Freq: Once | SUBCUTANEOUS | Status: AC
  Administered 2019-02-21: 60 mg via SUBCUTANEOUS
  Filled 2019-02-21: qty 1

## 2019-02-21 NOTE — Progress Notes (Signed)
Crystal [name] presents today for injection per MD orders. Prolia 60 mg administered SQ in left Abdomen. Administration without incident. Patient tolerated well.  Treatment given per orders. Patient tolerated it well without problems. Vitals stable and discharged home from clinic ambulatory. Follow up as scheduled.

## 2019-02-21 NOTE — Progress Notes (Signed)
Diagnosis Malignant neoplasm of upper-outer quadrant of right breast in female, estrogen receptor positive ([place]) - Plan: DG Bone Density, MM Digital Diagnostic Bilat, CBC with Differential, Comprehensive metabolic panel, Lactate dehydrogenase  Postmenopausal - Plan: DG Bone Density, MM Digital Diagnostic Bilat, CBC with Differential, Comprehensive metabolic panel, Lactate dehydrogenase  Staging Cancer Staging Breast cancer ([place]) Staging form: Breast, AJCC 7th Edition - Clinical stage from 03/31/2015: Stage IA (T1b, N0, M0) - Signed by Baird Cancer, PA-C on 04/17/2016   Assessment and Plan:   1.  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  s/p lumpectomy with Dr. Arnoldo Morale. Oncotype DX score 4-low risk suggesting no benefit from adjuvant chemotherapy. Completed adjuvant radiation therapy with Dr. Pablo Ledger on 06/05/15. Started anti-estrogen therapy with Anastrozole in 05/2015. She was previously followed by Dr. Talbert Cage and recommended to continue Arimidex likely for 5-10 years. She discussed Breast Cancer Index (BCI) testing to predict benefit from extension of anti-estrogen therapy.  Results of BCI done 08/2017 showed a low liklihood of benefit to extended adjuvant therapy.  She is planned for 5 years of therapy which should continue until 05/2020.    Labs done 02/21/2019 reviewed and showed WBC 5.8 HB 12.6 plts 249,000.  Chemistries WNL with K+ 4 Cr 0.87 and normal LFTs.  Calcium normal at 9.5.    Diagnostic mammogram done 02/20/2018 showed no evidence of malignancy.  Pt has not had bilateral diagnostic mammogram in February 2020. This is ordered today and she will be notified of results.  Pt will RTC in 07/2019 with labs and follow-up prior to Prolia.  She will return to clinic in 6 months for follow-up.  She should notify the office if she has any problems prior to that time.    2.  Osteopenia.  Last DEXA scan 06/12/2017 showed osteopenia.  Pt  was recommended calcium/vitamin D supplementation.  She previously had vitamin D deficiency and has been recommended to continue vitamin D supplementation.  She is also on Prolia injections every 6 months.   She will be set up for repeat BMD in 05/2019.    3.  Hypertension.  BP is 146/65.  Follow-up with PCP as directed.    25 minutes spent with more than 50% spent in review of records, counseling and coordination of care.    Current Status:  Pt is here today for follow-up prior to prolia.  She has not had mammogram.      Breast cancer ([place])   02/19/2015 Procedure    Breast, right, needle core biopsy, UOQ    02/19/2015 Pathology Results    - INVASIVE DUCTAL CARCINOMA.  ER 100%, PR 100%, Ki-67 18%, HER2 NEGATIVE    03/18/2015 Procedure    Breast, lumpectomy, right breast mass by Dr. Arnoldo Morale     03/18/2015 Pathology Results    - INVASIVE DUCTAL CARCINOMA, NOTTINGHAM COMBINED HISTOLOGIC GRADE II, 0.9 CM. - LOW GRADE DUCTAL CARCINOMA IN SITU. - RESECTION MARGINS, NEGATIVE FOR ATYPIA OR MALIGNANCY.    03/31/2015 Initial Diagnosis    Breast cancer    04/02/2015 Oncotype testing    Recurrence score of 4, low-risk.    04/30/2015 - 06/05/2015 Radiation Therapy    Right breast 50 Gy at 2 Gy/fraction x 25 fractions.  Dr. Pablo Ledger    06/08/2015 Imaging    DEXA-  BMD as determined from Femur Neck Right is 0.843 g/cm2 with a T-Score of -1.4. This patient is considered osteopenic according to [facility] (  WHO) criteria.    06/10/2015 -  Anti-estrogen oral therapy    Arimidex    09/15/2017 Miscellaneous    BCI: 3.6% risk of late recurrence. Low likelihood of benefit from extended endocrine therapy.      Problem List Patient Active Problem List   Diagnosis Date Noted  . Lumbar spinal stenosis [M48.061] 11/07/2017  . Gait abnormality [R26.9] 10/18/2017  . Memory disorder [R41.3] 10/18/2017  . Osteopenia determined by x-ray [M85.80] 07/23/2015  . Breast cancer ([place]) [C50.919]  03/31/2015  . Colitis, ischemic ([place]) [K55.9] 06/27/2011  . Tubular adenoma [D36.9] 06/27/2011    Past Medical History Past Medical History:  Diagnosis Date  . Breast cancer ([place]) 03/21/15   right  . Chronic fatigue   . Colitis, ischemic ([place]) 08/2007, 05/2011   Last colonoscopy/bx by Dr Rourk->(08/31/07) descending colon  . Depression   . Diverticulosis   . Enlarged thyroid   . Gait abnormality 10/18/2017  . GERD (gastroesophageal reflux disease)   . HTN (hypertension)   . Lumbar spinal stenosis 11/07/2017   Severe at L4-5, moderate at L3-4  . 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 OA (osteoarthritis)   . Obesity   . PONV (postoperative nausea and vomiting)   . Ptosis of eyelid, left   . Sleep apnea    cpap  . Spinal stenosis   . Tubular adenoma of colon 08/2007    Past Surgical History Past Surgical History:  Procedure Laterality Date  . ABDOMINAL HYSTERECTOMY     partial  . BREAST CYST EXCISION     benign, left x2, right x1  . CATARACT EXTRACTION W/PHACO  12/01/2011   Procedure: CATARACT EXTRACTION PHACO AND INTRAOCULAR LENS PLACEMENT (IOC);  Surgeon: Tonny Branch;  Location: [facility];  Service: Ophthalmology;  Laterality: Right;  CDE=14.87  . CATARACT EXTRACTION W/PHACO  02/06/2012   Procedure: CATARACT EXTRACTION PHACO AND INTRAOCULAR LENS PLACEMENT (IOC);  Surgeon: Tonny Branch, MD;  Location: [facility];  Service: Ophthalmology;  Laterality: Left;  CDE 13.00  . COLONOSCOPY  Sept 2008   Last colonoscopy/bx by Dr Rourk->(08/31/07) descending colon TUBULAR ADENOMA  . COLONOSCOPY  10/25/2012   Procedure: COLONOSCOPY;  Surgeon: Daneil Dolin, MD;  Location: [facility];  Service: Endoscopy;  Laterality: N/A;  10:15-changed to 10:30 Darius Bump notified  . PARTIAL MASTECTOMY WITH NEEDLE LOCALIZATION AND AXILLARY SENTINEL LYMPH NODE BX Right 03/18/2015   Procedure: PARTIAL MASTECTOMY AFTER NEEDLE LOCALIZATION AND AXILLARY SENTINEL LYMPH NODE BX;  Surgeon: Aviva Signs Md, MD;  Location: [facility];   Service: General;  Laterality: Right;  . PTOSIS REPAIR Left 05/11/2017   Procedure: INTERNAL PTOSIS REPAIR OF LEFT EYELID;  Surgeon: Clista Bernhardt, MD;  Location: [facility];  Service: Ophthalmology;  Laterality: Left;  . TUBAL LIGATION  1970   hysterectomy    Family History Family History  Problem Relation Age of Onset  . Cancer Brother 78  . Coronary artery disease Brother   . Diabetes Sister   . Heart disease Father   . Heart disease Brother   . Anesthesia problems Neg Hx   . Hypotension Neg Hx   . Pseudochol deficiency Neg Hx   . Malignant hyperthermia Neg Hx   . Colon cancer Neg Hx      Social History  reports that she has never smoked. She has never used smokeless tobacco. She reports that she does not drink alcohol or use drugs.  Medications  Current Outpatient Medications:  .  acetaminophen (TYLENOL ARTHRITIS PAIN) 650 MG CR  tablet, Take 1,300 mg by mouth 2 (two) times daily. , Disp: , Rfl:  .  anastrozole (ARIMIDEX) 1 MG tablet, TAKE 1 TABLET EVERY DAY, Disp: 90 tablet, Rfl: 1 .  Artificial Tear Solution (SOOTHE XP OP), Place 1 drop into both eyes 2 (two) times daily., Disp: , Rfl:  .  buPROPion (WELLBUTRIN SR) 150 MG 12 hr tablet, Take 150 mg by mouth 2 (two) times daily. , Disp: , Rfl:  .  Calcium-Vitamin D (CALTRATE 600 PLUS-VIT D PO), Take 1 tablet by mouth daily., Disp: , Rfl:  .  FLUoxetine (PROZAC) 20 MG capsule, Take 60 mg by mouth daily. , Disp: , Rfl:  .  fluticasone (FLONASE) 50 MCG/ACT nasal spray, Place 2 sprays into both nostrils daily. , Disp: , Rfl:  .  loratadine (CLARITIN) 10 MG tablet, Take 10 mg by mouth daily.  , Disp: , Rfl:  .  losartan-hydrochlorothiazide (HYZAAR) 100-25 MG per tablet, Take 1 tablet by mouth daily., Disp: , Rfl:  .  pantoprazole (PROTONIX) 40 MG tablet, TAKE 1 TABLET BY MOUTH TWICE DAILY (Patient taking differently: TAKE 1 TABLET BY MOUTH DAILY 30 MINUTES BEFORE BREAKFAST), Disp: 60 tablet, Rfl: 5 .  simvastatin (ZOCOR) 10 MG  tablet, Take 10 mg by mouth at bedtime. , Disp: , Rfl:   Allergies Bactrim [sulfamethoxazole-trimethoprim]  Review of Systems Review of Systems - Oncology ROS negative   Physical Exam  Vitals Wt Readings from Last 3 Encounters:  02/21/19 222 lb (100.7 kg)  10/23/18 218 lb 9.6 oz (99.2 kg)  08/20/18 220 lb 8 oz (100 kg)   Temp Readings from Last 3 Encounters:  02/21/19 98.5 F (36.9 C) (Oral)  09/13/18 98.4 F (36.9 C) (Oral)  08/20/18 98.6 F (37 C) (Oral)   BP Readings from Last 3 Encounters:  02/21/19 (!) 146/65  10/23/18 128/78  09/13/18 (!) 129/55   Pulse Readings from Last 3 Encounters:  02/21/19 96  10/23/18 93  09/13/18 80   Constitutional: Well-developed, well-nourished, and in no distress.   HENT: Head: Normocephalic and atraumatic.  Mouth/Throat: No oropharyngeal exudate. Mucosa moist. Eyes: Pupils are equal, round, and reactive to light. Conjunctivae are normal. No scleral icterus.  Neck: Normal range of motion. Neck supple. No JVD present.  Cardiovascular: Normal rate, regular rhythm and normal heart sounds.  Exam reveals no gallop and no friction rub.   No murmur heard. Pulmonary/Chest: Effort normal and breath sounds normal. No respiratory distress. No wheezes.No rales.  Abdominal: Soft. Bowel sounds are normal. No distension. There is no tenderness. There is no guarding.  Musculoskeletal: No edema or tenderness.  Lymphadenopathy: No cervical, axillary or supraclavicular adenopathy.  Neurological: Alert and oriented to person, place, and time. No cranial nerve deficit.  Skin: Skin is warm and dry. No rash noted. No erythema. No pallor.  Psychiatric: Affect and judgment normal.  Breast exam:  Chaperone present.  Lumpectomy changes noted in right breast.  No dominant masses palpable bilaterally.    Labs Appointment on 02/21/2019  Component Date Value Ref Range Status  . WBC 02/21/2019 5.8  4.0 - 10.5 K/uL Final  . RBC 02/21/2019 4.07  3.87 - 5.11  MIL/uL Final  . Hemoglobin 02/21/2019 12.6  12.0 - 15.0 g/dL Final  . HCT 02/21/2019 39.4  36.0 - 46.0 % Final  . MCV 02/21/2019 96.8  80.0 - 100.0 fL Final  . MCH 02/21/2019 31.0  26.0 - 34.0 pg Final  . MCHC 02/21/2019 32.0  30.0 - 36.0 g/dL Final  .  RDW 02/21/2019 12.7  11.5 - 15.5 % Final  . Platelets 02/21/2019 249  150 - 400 K/uL Final  . nRBC 02/21/2019 0.0  0.0 - 0.2 % Final  . Neutrophils Relative % 02/21/2019 50  % Final  . Neutro Abs 02/21/2019 3.0  1.7 - 7.7 K/uL Final  . Lymphocytes Relative 02/21/2019 33  % Final  . Lymphs Abs 02/21/2019 1.9  0.7 - 4.0 K/uL Final  . Monocytes Relative 02/21/2019 11  % Final  . Monocytes Absolute 02/21/2019 0.6  0.1 - 1.0 K/uL Final  . Eosinophils Relative 02/21/2019 5  % Final  . Eosinophils Absolute 02/21/2019 0.3  0.0 - 0.5 K/uL Final  . Basophils Relative 02/21/2019 1  % Final  . Basophils Absolute 02/21/2019 0.0  0.0 - 0.1 K/uL Final  . Immature Granulocytes 02/21/2019 0  % Final  . Abs Immature Granulocytes 02/21/2019 0.01  0.00 - 0.07 K/uL Final   Performed at [facility], [address], [place], [place] [address]  . Sodium 02/21/2019 138  135 - 145 mmol/L Final  . Potassium 02/21/2019 4.0  3.5 - 5.1 mmol/L Final  . Chloride 02/21/2019 105  98 - 111 mmol/L Final  . CO2 02/21/2019 24  22 - 32 mmol/L Final  . Glucose, Bld 02/21/2019 121* 70 - 99 mg/dL Final  . BUN 02/21/2019 19  8 - 23 mg/dL Final  . Creatinine, Ser 02/21/2019 0.87  0.44 - 1.00 mg/dL Final  . Calcium 02/21/2019 9.5  8.9 - 10.3 mg/dL Final  . Total Protein 02/21/2019 7.4  6.5 - 8.1 g/dL Final  . Albumin 02/21/2019 4.3  3.5 - 5.0 g/dL Final  . AST 02/21/2019 25  15 - 41 U/L Final  . ALT 02/21/2019 17  0 - 44 U/L Final  . Alkaline Phosphatase 02/21/2019 66  38 - 126 U/L Final  . Total Bilirubin 02/21/2019 0.6  0.3 - 1.2 mg/dL Final  . GFR calc non Af Amer 02/21/2019 >60  >60 mL/min Final  . GFR calc Af Amer 02/21/2019 >60  >60 mL/min Final  . Anion gap 02/21/2019  9  5 - 15 Final   Performed at [facility], [address], [place], [place] [address]     Pathology Orders Placed This Encounter  Procedures  . DG Bone Density    Standing Status:   Future    Standing Expiration Date:   02/21/2020    Order Specific Question:   Reason for Exam (SYMPTOM  OR DIAGNOSIS REQUIRED)    Answer:   postmenopausal    Order Specific Question:   Preferred imaging location?    Answer:   [facility]  . MM Digital Diagnostic Bilat    mammo incorrect order/kr adv [id number]    Standing Status:   Future    Standing Expiration Date:   02/21/2020    Order Specific Question:   Reason for Exam (SYMPTOM  OR DIAGNOSIS REQUIRED)    Answer:   right breast cancer    Order Specific Question:   Preferred imaging location?    Answer:   [facility]  . CBC with Differential    Standing Status:   Future    Standing Expiration Date:   02/22/2020  . Comprehensive metabolic panel    Standing Status:   Future    Standing Expiration Date:   02/22/2020  . Lactate dehydrogenase    Standing Status:   Future    Standing Expiration Date:   02/22/2020  Zoila Shutter MD

## 2019-02-28 ENCOUNTER — Other Ambulatory Visit (HOSPITAL_COMMUNITY): Payer: Self-pay | Admitting: Internal Medicine

## 2019-02-28 DIAGNOSIS — C50411 Malignant neoplasm of upper-outer quadrant of right female breast: Secondary | ICD-10-CM

## 2019-02-28 DIAGNOSIS — Z170 Estrogen receptor positive status [ER+]: Principal | ICD-10-CM

## 2019-02-28 DIAGNOSIS — Z780 Asymptomatic menopausal state: Secondary | ICD-10-CM

## 2019-03-11 ENCOUNTER — Other Ambulatory Visit (HOSPITAL_COMMUNITY): Payer: Self-pay

## 2019-03-11 DIAGNOSIS — C50411 Malignant neoplasm of upper-outer quadrant of right female breast: Secondary | ICD-10-CM

## 2019-03-11 MED ORDER — ANASTROZOLE 1 MG PO TABS: 1.0000 mg | ORAL_TABLET | Freq: Every day | ORAL | 1 refills | Status: DC

## 2019-03-11 NOTE — Telephone Encounter (Signed)
Refill request for anastrozole sent to [facility] per patients request.

## 2019-03-12 ENCOUNTER — Ambulatory Visit (HOSPITAL_COMMUNITY): Admission: RE | Admit: 2019-03-12 | Payer: Medicare HMO | Source: Ambulatory Visit

## 2019-03-12 ENCOUNTER — Ambulatory Visit (HOSPITAL_COMMUNITY)
Admission: RE | Admit: 2019-03-12 | Discharge: 2019-03-12 | Disposition: A | Discharge status: Home / Self Care | Payer: Medicare HMO | Source: Ambulatory Visit | Attending: Internal Medicine | Admitting: Internal Medicine

## 2019-03-12 ENCOUNTER — Other Ambulatory Visit: Payer: Self-pay

## 2019-03-12 DIAGNOSIS — R922 Inconclusive mammogram: Secondary | ICD-10-CM | POA: Diagnosis not present

## 2019-03-12 DIAGNOSIS — Z170 Estrogen receptor positive status [ER+]: Secondary | ICD-10-CM | POA: Insufficient documentation

## 2019-03-12 DIAGNOSIS — C50411 Malignant neoplasm of upper-outer quadrant of right female breast: Secondary | ICD-10-CM | POA: Insufficient documentation

## 2019-03-12 DIAGNOSIS — Z780 Asymptomatic menopausal state: Secondary | ICD-10-CM

## 2019-03-14 DIAGNOSIS — I10 Essential (primary) hypertension: Secondary | ICD-10-CM | POA: Diagnosis not present

## 2019-03-14 DIAGNOSIS — E049 Nontoxic goiter, unspecified: Secondary | ICD-10-CM | POA: Diagnosis not present

## 2019-03-14 DIAGNOSIS — K219 Gastro-esophageal reflux disease without esophagitis: Secondary | ICD-10-CM | POA: Diagnosis not present

## 2019-03-14 DIAGNOSIS — Z79899 Other long term (current) drug therapy: Secondary | ICD-10-CM | POA: Diagnosis not present

## 2019-03-14 DIAGNOSIS — F329 Major depressive disorder, single episode, unspecified: Secondary | ICD-10-CM | POA: Diagnosis not present

## 2019-03-21 DIAGNOSIS — E785 Hyperlipidemia, unspecified: Secondary | ICD-10-CM | POA: Diagnosis not present

## 2019-03-21 DIAGNOSIS — F3340 Major depressive disorder, recurrent, in remission, unspecified: Secondary | ICD-10-CM | POA: Diagnosis not present

## 2019-03-21 DIAGNOSIS — Z853 Personal history of malignant neoplasm of breast: Secondary | ICD-10-CM | POA: Diagnosis not present

## 2019-03-21 DIAGNOSIS — G4733 Obstructive sleep apnea (adult) (pediatric): Secondary | ICD-10-CM | POA: Diagnosis not present

## 2019-06-07 NOTE — Progress Notes (Signed)
For review.  Please set up follow-up with Dr. Raliegh Ip or Lala Lund

## 2019-06-17 ENCOUNTER — Ambulatory Visit (HOSPITAL_COMMUNITY)
Admission: RE | Admit: 2019-06-17 | Discharge: 2019-06-17 | Disposition: A | Discharge status: Home / Self Care | Payer: Medicare HMO | Source: Ambulatory Visit | Attending: Internal Medicine | Admitting: Internal Medicine

## 2019-06-17 ENCOUNTER — Other Ambulatory Visit: Payer: Self-pay

## 2019-06-17 DIAGNOSIS — Z780 Asymptomatic menopausal state: Secondary | ICD-10-CM | POA: Insufficient documentation

## 2019-06-17 DIAGNOSIS — Z170 Estrogen receptor positive status [ER+]: Secondary | ICD-10-CM | POA: Diagnosis not present

## 2019-06-17 DIAGNOSIS — C50411 Malignant neoplasm of upper-outer quadrant of right female breast: Secondary | ICD-10-CM | POA: Diagnosis not present

## 2019-06-17 DIAGNOSIS — M85851 Other specified disorders of bone density and structure, right thigh: Secondary | ICD-10-CM | POA: Diagnosis not present

## 2019-07-05 NOTE — Progress Notes (Signed)
For review.  Please set up follow-up with Dr. Raliegh Ip or Lala Lund.  Please update ordering provider

## 2019-07-17 ENCOUNTER — Other Ambulatory Visit (HOSPITAL_COMMUNITY): Payer: Self-pay | Admitting: Hematology

## 2019-07-17 DIAGNOSIS — C50411 Malignant neoplasm of upper-outer quadrant of right female breast: Secondary | ICD-10-CM

## 2019-07-22 ENCOUNTER — Encounter (HOSPITAL_COMMUNITY): Payer: Self-pay | Admitting: Emergency Medicine

## 2019-07-22 ENCOUNTER — Other Ambulatory Visit: Payer: Self-pay

## 2019-07-22 ENCOUNTER — Emergency Department (HOSPITAL_COMMUNITY): Payer: Medicare HMO

## 2019-07-22 ENCOUNTER — Emergency Department (HOSPITAL_COMMUNITY)
Admission: EM | Admit: 2019-07-22 | Discharge: 2019-07-22 | Disposition: A | Discharge status: Home / Self Care | Payer: Medicare HMO | Attending: Emergency Medicine | Admitting: Emergency Medicine

## 2019-07-22 DIAGNOSIS — I10 Essential (primary) hypertension: Secondary | ICD-10-CM | POA: Diagnosis not present

## 2019-07-22 DIAGNOSIS — M25552 Pain in left hip: Secondary | ICD-10-CM | POA: Diagnosis not present

## 2019-07-22 DIAGNOSIS — Z79899 Other long term (current) drug therapy: Secondary | ICD-10-CM | POA: Insufficient documentation

## 2019-07-22 DIAGNOSIS — N281 Cyst of kidney, acquired: Secondary | ICD-10-CM | POA: Diagnosis not present

## 2019-07-22 DIAGNOSIS — Z853 Personal history of malignant neoplasm of breast: Secondary | ICD-10-CM | POA: Insufficient documentation

## 2019-07-22 DIAGNOSIS — K5730 Diverticulosis of large intestine without perforation or abscess without bleeding: Secondary | ICD-10-CM | POA: Diagnosis not present

## 2019-07-22 DIAGNOSIS — K449 Diaphragmatic hernia without obstruction or gangrene: Secondary | ICD-10-CM | POA: Diagnosis not present

## 2019-07-22 DIAGNOSIS — R109 Unspecified abdominal pain: Secondary | ICD-10-CM

## 2019-07-22 LAB — COMPREHENSIVE METABOLIC PANEL
ALT: 18 U/L (ref 0–44)
AST: 23 U/L (ref 15–41)
Albumin: 4.8 g/dL (ref 3.5–5.0)
Alkaline Phosphatase: 59 U/L (ref 38–126)
Anion gap: 10 (ref 5–15)
BUN: 14 mg/dL (ref 8–23)
CO2: 24 mmol/L (ref 22–32)
Calcium: 9.7 mg/dL (ref 8.9–10.3)
Chloride: 104 mmol/L (ref 98–111)
Creatinine, Ser: 0.85 mg/dL (ref 0.44–1.00)
GFR calc Af Amer: >60 mL/min (ref >60)
GFR calc non Af Amer: >60 mL/min (ref >60)
Glucose, Bld: 100 mg/dL — ABNORMAL HIGH (ref 70–99)
Potassium: 4.1 mmol/L (ref 3.5–5.1)
Sodium: 138 mmol/L (ref 135–145)
Total Bilirubin: 0.7 mg/dL (ref 0.3–1.2)
Total Protein: 7.8 g/dL (ref 6.5–8.1)

## 2019-07-22 LAB — CBC WITH DIFFERENTIAL/PLATELET
Abs Immature Granulocytes: 0.01 10*3/uL (ref 0.00–0.07)
Basophils Absolute: 0.1 10*3/uL (ref 0.0–0.1)
Basophils Relative: 1 %
Eosinophils Absolute: 0.3 10*3/uL (ref 0.0–0.5)
Eosinophils Relative: 5 %
HCT: 41.8 % (ref 36.0–46.0)
Hemoglobin: 13.8 g/dL (ref 12.0–15.0)
Immature Granulocytes: 0 %
Lymphocytes Relative: 33 %
Lymphs Abs: 2.2 10*3/uL (ref 0.7–4.0)
MCH: 31.8 pg (ref 26.0–34.0)
MCHC: 33 g/dL (ref 30.0–36.0)
MCV: 96.3 fL (ref 80.0–100.0)
Monocytes Absolute: 0.7 10*3/uL (ref 0.1–1.0)
Monocytes Relative: 10 %
Neutro Abs: 3.5 10*3/uL (ref 1.7–7.7)
Neutrophils Relative %: 51 %
Platelets: 261 10*3/uL (ref 150–400)
RBC: 4.34 MIL/uL (ref 3.87–5.11)
RDW: 12.9 % (ref 11.5–15.5)
WBC: 6.7 10*3/uL (ref 4.0–10.5)
nRBC: 0 % (ref 0.0–0.2)

## 2019-07-22 LAB — URINALYSIS, ROUTINE W REFLEX MICROSCOPIC
Bacteria, UA: NONE SEEN
Bilirubin Urine: NEGATIVE
Glucose, UA: NEGATIVE mg/dL
Ketones, ur: NEGATIVE mg/dL
Nitrite: NEGATIVE
Protein, ur: NEGATIVE mg/dL
Specific Gravity, Urine: 1.015 (ref 1.005–1.030)
pH: 7 (ref 5.0–8.0)

## 2019-07-22 MED ORDER — HYDROMORPHONE HCL 1 MG/ML IJ SOLN
0.5000 mg | Freq: Once | INTRAMUSCULAR | Status: AC
  Administered 2019-07-22: 0.5 mg via INTRAVENOUS
  Filled 2019-07-22: qty 1

## 2019-07-22 MED ORDER — HYDROCODONE-ACETAMINOPHEN 5-325 MG PO TABS: 1.0000 | ORAL_TABLET | Freq: Four times a day (QID) | ORAL | 0 refills | Status: DC | PRN

## 2019-07-22 MED ORDER — ONDANSETRON HCL 4 MG/2ML IJ SOLN
4.0000 mg | Freq: Once | INTRAMUSCULAR | Status: AC
  Administered 2019-07-22: 4 mg via INTRAVENOUS
  Filled 2019-07-22: qty 2

## 2019-07-22 NOTE — ED Provider Notes (Signed)
[facility] EMERGENCY DEPARTMENT Provider Note   CSN: [id number] Arrival date & time: 07/22/19  7829     History   Chief Complaint Chief Complaint  Patient presents with   Flank Pain    left    HPI Crystal [name] is a 78 y.o. female.     Patient complains of left flank pain going down to her abdomen and her left hip.  The history is provided by the patient. No language interpreter was used.  Flank Pain This is a new problem. The current episode started more than 2 days ago. The problem occurs constantly. The problem has not changed since onset.Pertinent negatives include no chest pain, no abdominal pain and no headaches. Nothing aggravates the symptoms. Nothing relieves the symptoms. She has tried nothing for the symptoms. The treatment provided no relief.    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   OA (osteoarthritis)    Obesity    PONV (postoperative nausea and vomiting)    Ptosis of eyelid, left    Sleep apnea    cpap   Spinal stenosis    Tubular adenoma of colon 08/2007    Patient Active Problem List   Diagnosis Date Noted   Lumbar spinal stenosis 11/07/2017   Gait abnormality 10/18/2017   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   Osteopenia determined by x-ray 07/23/2015   Breast cancer ([place]) 03/31/2015   Colitis, ischemic ([place]) 06/27/2011   Tubular adenoma 06/27/2011    Past Surgical History:  Procedure Laterality Date   ABDOMINAL HYSTERECTOMY     partial   BREAST CYST EXCISION     benign, left x2, right x1   CATARACT EXTRACTION  W/PHACO  12/01/2011   Procedure: CATARACT EXTRACTION PHACO AND INTRAOCULAR LENS PLACEMENT ([place]);  Surgeon: Tonny Branch;  Location: [facility] OR[facility];  Service: Ophthalmology;  Laterality: Right;  CDE=14.87   CATARACT EXTRACTION W/PHACO  02/06/2012   Procedure: CATARACT EXTRACTION PHACO AND INTRAOCULAR LENS PLACEMENT (IOC);  Surgeon: Tonny Branch, MD;  Location: [facility] ORS;  Service: Ophthalmology;  Laterality: Left;  CDE 13.00   COLONOSCOPY  Sept 2008   Last colonoscopy/bx by Dr Rourk->(08/31/07) descending colon TUBULAR ADENOMA   COLONOSCOPY  10/25/2012   Procedure: COLONOSCOPY;  Surgeon: Daneil Dolin, MD;  Location: [facility];  Service: Endoscopy;  Laterality: N/A;  10:15-changed to 10:30 Leigh Ann notified   PARTIAL MASTECTOMY WITH NEEDLE LOCALIZATION AND AXILLARY SENTINEL LYMPH NODE BX Right 03/18/2015   Procedure: PARTIAL MASTECTOMY AFTER NEEDLE LOCALIZATION AND AXILLARY SENTINEL LYMPH NODE BX;  Surgeon: Aviva Signs Md, MD;  Location: [facility] OR[facility];  Service: General;  Laterality: Right;   PTOSIS REPAIR Left 05/11/2017   Procedure: INTERNAL PTOSIS REPAIR OF LEFT EYELID;  Surgeon: Clista Bernhardt, MD;  Location: [place];  Service: Ophthalmology;  Laterality: Left;   TUBAL LIGATION  1970   hysterectomy     OB History   No obstetric history on file.      Home Medications    Prior to Admission medications   Medication Sig Start Date End  Date Taking? Authorizing Provider  acetaminophen (TYLENOL ARTHRITIS PAIN) 650 MG CR tablet Take 1,300 mg by mouth 2 (two) times daily.    Yes [provider]  amLODipine (NORVASC) 10 MG tablet Take 10 mg by mouth daily.   Yes [provider]  anastrozole (ARIMIDEX) 1 MG tablet TAKE 1 TABLET EVERY DAY 07/17/19  Yes Derek Jack, MD  Artificial Tear Solution (SOOTHE XP OP) Place 1 drop into both eyes 2 (two) times daily.   Yes [provider]  buPROPion (WELLBUTRIN SR) 150 MG 12 hr tablet Take 150 mg by mouth 2 (two) times daily.  06/02/11   Yes [provider]  Calcium-Vitamin D (CALTRATE 600 PLUS-VIT D PO) Take 1 tablet by mouth daily.   Yes [provider]  FLUoxetine (PROZAC) 20 MG capsule Take 60 mg by mouth daily.  06/23/11  Yes [provider]  fluticasone (FLONASE) 50 MCG/ACT nasal spray Place 2 sprays into both nostrils daily.    Yes [provider]  loratadine (CLARITIN) 10 MG tablet Take 10 mg by mouth daily.     Yes [provider]  losartan-hydrochlorothiazide (HYZAAR) 100-25 MG per tablet Take 1 tablet by mouth daily.   Yes [provider]  pantoprazole (PROTONIX) 40 MG tablet TAKE 1 TABLET BY MOUTH TWICE DAILY Patient taking differently: TAKE 1 TABLET BY MOUTH DAILY 30 MINUTES BEFORE BREAKFAST 12/14/15  Yes Mannam, Praveen, MD  simvastatin (ZOCOR) 10 MG tablet Take 10 mg by mouth at bedtime.    Yes [provider]  HYDROcodone-acetaminophen (NORCO/VICODIN) 5-325 MG tablet Take 1 tablet by mouth every 6 (six) hours as needed. 07/22/19   Milton Ferguson, MD    Family History Family History  Problem Relation Age of Onset   Cancer Brother 6   Coronary artery disease Brother    Diabetes Sister    Heart disease Father    Heart disease Brother    Anesthesia problems Neg Hx    Hypotension Neg Hx    Pseudochol deficiency Neg Hx    Malignant hyperthermia Neg Hx    Colon cancer Neg Hx     Social History Social History   Tobacco Use   Smoking status: Never Smoker   Smokeless tobacco: Never Used  Substance Use Topics   Alcohol use: No   Drug use: No     Allergies   Bactrim [sulfamethoxazole-trimethoprim]   Review of Systems Review of Systems  Constitutional: Negative for appetite change and fatigue.  HENT: Negative for congestion, ear discharge and sinus pressure.   Eyes: Negative for discharge.  Respiratory: Negative for cough.   Cardiovascular: Negative for chest pain.  Gastrointestinal: Negative for abdominal pain and  diarrhea.  Genitourinary: Positive for flank pain. Negative for frequency and hematuria.  Musculoskeletal: Negative for back pain.  Skin: Negative for rash.  Neurological: Negative for seizures and headaches.  Psychiatric/Behavioral: Negative for hallucinations.     Physical Exam Updated Vital Signs BP (!) 136/54    Pulse 75    Temp 98.1 F (36.7 C) (Oral)    Resp 16    Ht 5\' 3"  (1.6 m)    Wt 99.8 kg    SpO2 97%    BMI 38.97 kg/m   Physical Exam Vitals signs and nursing note reviewed.  Constitutional:      Appearance: She is well-developed.  HENT:     Head: Normocephalic.     Nose: Nose normal.  Eyes:     General: No scleral icterus.  Conjunctiva/sclera: Conjunctivae normal.  Neck:     Musculoskeletal: Neck supple.     Thyroid: No thyromegaly.  Cardiovascular:     Rate and Rhythm: Normal rate and regular rhythm.     Heart sounds: No murmur. No friction rub. No gallop.   Pulmonary:     Breath sounds: No stridor. No wheezing or rales.  Chest:     Chest wall: No tenderness.  Abdominal:     General: There is no distension.     Tenderness: There is no abdominal tenderness. There is no rebound.  Musculoskeletal:     Comments: Tenderness to left flank and left hip  Lymphadenopathy:     Cervical: No cervical adenopathy.  Skin:    Findings: No erythema or rash.  Neurological:     Mental Status: She is oriented to person, place, and time.     Motor: No abnormal muscle tone.     Coordination: Coordination normal.  Psychiatric:        Behavior: Behavior normal.      ED Treatments / Results  Labs (all labs ordered are listed, but only abnormal results are displayed) Labs Reviewed  COMPREHENSIVE METABOLIC PANEL - Abnormal; Notable for the following components:      Result Value   Glucose, Bld 100 (*)    All other components within normal limits  URINALYSIS, ROUTINE W REFLEX MICROSCOPIC - Abnormal; Notable for the following components:   Hgb urine dipstick SMALL (*)     Leukocytes,Ua TRACE (*)    All other components within normal limits  CBC WITH DIFFERENTIAL/PLATELET    EKG None  Radiology Ct Renal Stone Study  Result Date: 07/22/2019 CLINICAL DATA:  Flank pain.  Recurrent stone disease.  No hematuria. EXAM: CT ABDOMEN AND PELVIS WITHOUT CONTRAST TECHNIQUE: Multidetector CT imaging of the abdomen and pelvis was performed following the standard protocol without IV contrast. COMPARISON:  CT August 15, 2017 FINDINGS: Lower chest: Lung bases are clear. Hepatobiliary: No focal hepatic lesion. No biliary duct dilatation. Gallbladder is normal. Common bile duct is normal. Pancreas: Pancreas is normal. No ductal dilatation. No pancreatic inflammation. Spleen: Normal spleen Adrenals/urinary tract: Adrenal glands normal. No nephrolithiasis or ureterolithiasis. No obstructive uropathy. Simple fluid attenuation cystic appearing lesion in the lower pole of the RIGHT kidney measures 2.5 cm and has simple fluid attenuation. A similar lesion upper pole of the RIGHT kidney. No bladder calculi. Stomach/Bowel: Small hiatal hernia. Stomach, small-bowel and cecum are normal. The appendix is not identified but there is no pericecal inflammation to suggest appendicitis. The colon and rectosigmoid colon are normal. Scattered diverticula of the sigmoid colon without acute inflammation Vascular/Lymphatic: Abdominal aorta is normal caliber with atherosclerotic calcification. There is no retroperitoneal or periportal lymphadenopathy. No pelvic lymphadenopathy. Reproductive: Post hysterectomy Other: No free fluid. Musculoskeletal: No aggressive osseous lesion. A grade 1 anterolisthesis of L4 on L5 a stable compared to prior pelvic CT. IMPRESSION: 1. No nephrolithiasis, ureterolithiasis, or obstructive uropathy. 2. Benign appearing cystic lesions in the RIGHT kidney. 3. No evidence of diverticulitis. Scattered diverticulosis of the sigmoid colon Electronically Signed   By: Suzy Bouchard M.D.    On: 07/22/2019 11:18    Procedures Procedures (including critical care time)  Medications Ordered in ED Medications  HYDROmorphone (DILAUDID) injection 0.5 mg (0.5 mg Intravenous Given 07/22/19 0921)  ondansetron (ZOFRAN) injection 4 mg (4 mg Intravenous Given 07/22/19 0919)     Initial Impression / Assessment and Plan / ED Course  I have reviewed the triage vital  signs and the nursing notes.  Pertinent labs & imaging results that were available during my care of the patient were reviewed by me and considered in my medical decision making (see chart for details).        Labs including CBC urinalysis chemistries were all unremarkable.  CT scan of the abdomen is negative.  Patient with musculoskeletal discomfort.  She was given pain medicine and follow-up PCP  Final Clinical Impressions(s) / ED Diagnoses   Final diagnoses:  Flank pain    ED Discharge Orders         Ordered    HYDROcodone-acetaminophen (NORCO/VICODIN) 5-325 MG tablet  Every 6 hours PRN     07/22/19 1154           Milton Ferguson, MD 07/22/19 1157

## 2019-07-22 NOTE — ED Triage Notes (Signed)
C/o left flank pain since Saturday.  Rates pain 8/10.

## 2019-07-22 NOTE — Discharge Instructions (Addendum)
Follow-up with your doctor next week for recheck.  Take the pain medicine if Tylenol alone does not help

## 2019-07-23 DIAGNOSIS — I700 Atherosclerosis of aorta: Secondary | ICD-10-CM | POA: Diagnosis not present

## 2019-07-23 DIAGNOSIS — R109 Unspecified abdominal pain: Secondary | ICD-10-CM | POA: Diagnosis not present

## 2019-07-23 DIAGNOSIS — I10 Essential (primary) hypertension: Secondary | ICD-10-CM | POA: Diagnosis not present

## 2019-07-30 ENCOUNTER — Telehealth: Payer: Self-pay | Admitting: Pulmonary Disease

## 2019-07-30 DIAGNOSIS — G4733 Obstructive sleep apnea (adult) (pediatric): Secondary | ICD-10-CM

## 2019-07-30 NOTE — Telephone Encounter (Signed)
ATC pt's daughter, no answer. Left message for her  to call back.

## 2019-07-31 NOTE — Telephone Encounter (Signed)
Crystal [name] daughter is returning phone call.  Crystal [name] phone number is [phone].

## 2019-07-31 NOTE — Telephone Encounter (Signed)
Spoke with pt's daughter, Ivin Booty. She states that the pt is needing new CPAP supplies. Order has been placed. Nothing further was needed.

## 2019-07-31 NOTE — Telephone Encounter (Signed)
LMTCB x2 for pt's daughter, Ivin Booty.

## 2019-08-06 DIAGNOSIS — G4733 Obstructive sleep apnea (adult) (pediatric): Secondary | ICD-10-CM | POA: Diagnosis not present

## 2019-08-16 ENCOUNTER — Encounter (HOSPITAL_COMMUNITY): Payer: Self-pay | Admitting: Nurse Practitioner

## 2019-08-22 ENCOUNTER — Other Ambulatory Visit (HOSPITAL_COMMUNITY): Payer: Self-pay

## 2019-08-22 DIAGNOSIS — C50411 Malignant neoplasm of upper-outer quadrant of right female breast: Secondary | ICD-10-CM

## 2019-08-23 ENCOUNTER — Other Ambulatory Visit (HOSPITAL_COMMUNITY): Payer: Self-pay | Admitting: Nurse Practitioner

## 2019-08-23 ENCOUNTER — Encounter (HOSPITAL_COMMUNITY): Payer: Self-pay | Admitting: Nurse Practitioner

## 2019-08-23 ENCOUNTER — Other Ambulatory Visit: Payer: Self-pay

## 2019-08-23 ENCOUNTER — Inpatient Hospital Stay (HOSPITAL_COMMUNITY): Payer: Medicare HMO | Attending: Hematology

## 2019-08-23 ENCOUNTER — Inpatient Hospital Stay (HOSPITAL_COMMUNITY): Payer: Medicare HMO

## 2019-08-23 ENCOUNTER — Inpatient Hospital Stay (HOSPITAL_BASED_OUTPATIENT_CLINIC_OR_DEPARTMENT_OTHER): Payer: Medicare HMO | Admitting: Nurse Practitioner

## 2019-08-23 DIAGNOSIS — Z9989 Dependence on other enabling machines and devices: Secondary | ICD-10-CM | POA: Insufficient documentation

## 2019-08-23 DIAGNOSIS — Z79899 Other long term (current) drug therapy: Secondary | ICD-10-CM | POA: Diagnosis not present

## 2019-08-23 DIAGNOSIS — C50411 Malignant neoplasm of upper-outer quadrant of right female breast: Secondary | ICD-10-CM

## 2019-08-23 DIAGNOSIS — R05 Cough: Secondary | ICD-10-CM | POA: Diagnosis not present

## 2019-08-23 DIAGNOSIS — G479 Sleep disorder, unspecified: Secondary | ICD-10-CM | POA: Insufficient documentation

## 2019-08-23 DIAGNOSIS — Z923 Personal history of irradiation: Secondary | ICD-10-CM | POA: Diagnosis not present

## 2019-08-23 DIAGNOSIS — I10 Essential (primary) hypertension: Secondary | ICD-10-CM | POA: Diagnosis not present

## 2019-08-23 DIAGNOSIS — Z791 Long term (current) use of non-steroidal anti-inflammatories (NSAID): Secondary | ICD-10-CM | POA: Insufficient documentation

## 2019-08-23 DIAGNOSIS — K219 Gastro-esophageal reflux disease without esophagitis: Secondary | ICD-10-CM | POA: Diagnosis not present

## 2019-08-23 DIAGNOSIS — Z170 Estrogen receptor positive status [ER+]: Secondary | ICD-10-CM

## 2019-08-23 DIAGNOSIS — M8580 Other specified disorders of bone density and structure, unspecified site: Secondary | ICD-10-CM | POA: Insufficient documentation

## 2019-08-23 DIAGNOSIS — M1990 Unspecified osteoarthritis, unspecified site: Secondary | ICD-10-CM | POA: Diagnosis not present

## 2019-08-23 DIAGNOSIS — Z79811 Long term (current) use of aromatase inhibitors: Secondary | ICD-10-CM | POA: Insufficient documentation

## 2019-08-23 DIAGNOSIS — G4730 Sleep apnea, unspecified: Secondary | ICD-10-CM | POA: Insufficient documentation

## 2019-08-23 LAB — CBC WITH DIFFERENTIAL/PLATELET
Abs Immature Granulocytes: 0 10*3/uL (ref 0.00–0.07)
Basophils Absolute: 0 10*3/uL (ref 0.0–0.1)
Basophils Relative: 1 %
Eosinophils Absolute: 0.4 10*3/uL (ref 0.0–0.5)
Eosinophils Relative: 8 %
HCT: 39.1 % (ref 36.0–46.0)
Hemoglobin: 12.7 g/dL (ref 12.0–15.0)
Immature Granulocytes: 0 %
Lymphocytes Relative: 33 %
Lymphs Abs: 1.6 10*3/uL (ref 0.7–4.0)
MCH: 31.9 pg (ref 26.0–34.0)
MCHC: 32.5 g/dL (ref 30.0–36.0)
MCV: 98.2 fL (ref 80.0–100.0)
Monocytes Absolute: 0.5 10*3/uL (ref 0.1–1.0)
Monocytes Relative: 10 %
Neutro Abs: 2.3 10*3/uL (ref 1.7–7.7)
Neutrophils Relative %: 48 %
Platelets: 238 10*3/uL (ref 150–400)
RBC: 3.98 MIL/uL (ref 3.87–5.11)
RDW: 13.1 % (ref 11.5–15.5)
WBC: 4.7 10*3/uL (ref 4.0–10.5)
nRBC: 0 % (ref 0.0–0.2)

## 2019-08-23 LAB — COMPREHENSIVE METABOLIC PANEL
ALT: 17 U/L (ref 0–44)
AST: 25 U/L (ref 15–41)
Albumin: 4.3 g/dL (ref 3.5–5.0)
Alkaline Phosphatase: 57 U/L (ref 38–126)
Anion gap: 8 (ref 5–15)
BUN: 17 mg/dL (ref 8–23)
CO2: 26 mmol/L (ref 22–32)
Calcium: 9.4 mg/dL (ref 8.9–10.3)
Chloride: 107 mmol/L (ref 98–111)
Creatinine, Ser: 0.76 mg/dL (ref 0.44–1.00)
GFR calc Af Amer: >60 mL/min (ref >60)
GFR calc non Af Amer: >60 mL/min (ref >60)
Glucose, Bld: 96 mg/dL (ref 70–99)
Potassium: 4.4 mmol/L (ref 3.5–5.1)
Sodium: 141 mmol/L (ref 135–145)
Total Bilirubin: 0.7 mg/dL (ref 0.3–1.2)
Total Protein: 7.1 g/dL (ref 6.5–8.1)

## 2019-08-23 LAB — LACTATE DEHYDROGENASE: LDH: 181 U/L (ref 98–192)

## 2019-08-23 MED ORDER — DENOSUMAB 60 MG/ML ~~LOC~~ SOSY
60.0000 mg | PREFILLED_SYRINGE | Freq: Once | SUBCUTANEOUS | Status: AC
  Administered 2019-08-23: 13:00:00 60 mg via SUBCUTANEOUS

## 2019-08-23 MED ORDER — DENOSUMAB 60 MG/ML ~~LOC~~ SOSY
PREFILLED_SYRINGE | SUBCUTANEOUS | Status: AC
  Filled 2019-08-23: qty 1

## 2019-08-23 NOTE — Progress Notes (Signed)
[place], [place] [address]   CLINIC:  Medical Oncology/Hematology  PCP:  Asencion Noble, [address] [place] [address] [phone]   REASON FOR VISIT: Follow-up for right breast cancer  CURRENT THERAPY: Anastrozole daily  BRIEF ONCOLOGIC HISTORY:  Oncology History  Breast cancer ([place])  02/19/2015 Procedure   Breast, right, needle core biopsy, UOQ   02/19/2015 Pathology Results   - INVASIVE DUCTAL CARCINOMA.  ER 100%, PR 100%, Ki-67 18%, HER2 NEGATIVE   03/18/2015 Procedure   Breast, lumpectomy, right breast mass by Dr. Arnoldo Morale    03/18/2015 Pathology Results   - INVASIVE DUCTAL CARCINOMA, NOTTINGHAM COMBINED HISTOLOGIC GRADE II, 0.9 CM. - LOW GRADE DUCTAL CARCINOMA IN SITU. - RESECTION MARGINS, NEGATIVE FOR ATYPIA OR MALIGNANCY.   03/31/2015 Initial Diagnosis   Breast cancer   04/02/2015 Oncotype testing   Recurrence score of 4, low-risk.   04/30/2015 - 06/05/2015 Radiation Therapy   Right breast 50 Gy at 2 Gy/fraction x 25 fractions.  Dr. Pablo Ledger   06/08/2015 Imaging   DEXA-  BMD as determined from Femur Neck Right is 0.843 g/cm2 with a T-Score of -1.4. This patient is considered osteopenic according to [facility]) criteria.   06/10/2015 -  Anti-estrogen oral therapy   Arimidex   09/15/2017 Miscellaneous   BCI: 3.6% risk of late recurrence. Low likelihood of benefit from extended endocrine therapy.      CANCER STAGING: Cancer Staging Breast cancer [facility]) Staging form: Breast, AJCC 7th Edition - Clinical stage from 03/31/2015: Stage IA (T1b, N0, M0) - Signed by Baird Cancer, PA-C on 04/17/2016    INTERVAL HISTORY:  Ms. Schurman 78 y.o. female returns for routine follow-up for right breast cancer.  Patient reports she is feeling well since her last visit.  She does have occasional fatigue and problems sleeping at night.  She denies any bright red bleeding per rectum or melena.  She denies any  easy bruising.  Denies any hot flashes. Denies any nausea, vomiting, or diarrhea. Denies any new pains. Had not noticed any recent bleeding such as epistaxis, hematuria or hematochezia. Denies recent chest pain on exertion, shortness of breath on minimal exertion, pre-syncopal episodes, or palpitations. Denies any numbness or tingling in hands or feet. Denies any recent fevers, infections, or recent hospitalizations. Patient reports appetite at 100% and energy level at 25%.  She is eating well maintaining her weight at this time.    REVIEW OF SYSTEMS:  Review of Systems  Constitutional: Positive for fatigue.  Respiratory: Positive for cough.   Neurological: Positive for numbness.  Psychiatric/Behavioral: Positive for sleep disturbance.  All other systems reviewed and are negative.    PAST MEDICAL/SURGICAL HISTORY:  Past Medical History:  Diagnosis Date  . Breast cancer ([place]) 03/21/15   right  . Chronic fatigue   . Colitis, ischemic ([place]) 08/2007, 05/2011   Last colonoscopy/bx by Dr Rourk->(08/31/07) descending colon  . Depression   . Diverticulosis   . Enlarged thyroid   . Gait abnormality 10/18/2017  . GERD (gastroesophageal reflux disease)   . HTN (hypertension)   . Lumbar spinal stenosis 11/07/2017   Severe at L4-5, moderate at L3-4  . 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 OA (osteoarthritis)   . Obesity   . PONV (postoperative nausea and vomiting)   . Ptosis of eyelid, left   . Sleep apnea    cpap  . Spinal stenosis   . Tubular adenoma of colon 08/2007  Past Surgical History:  Procedure Laterality Date  . ABDOMINAL HYSTERECTOMY     partial  . BREAST CYST EXCISION     benign, left x2, right x1  . CATARACT EXTRACTION W/PHACO  12/01/2011   Procedure: CATARACT EXTRACTION PHACO AND INTRAOCULAR LENS PLACEMENT ([facility]);  Surgeon: Tonny Branch;  Location: [facility];  Service: Ophthalmology;  Laterality: Right;  CDE=14.87  . CATARACT EXTRACTION W/PHACO  02/06/2012   Procedure: CATARACT  EXTRACTION PHACO AND INTRAOCULAR LENS PLACEMENT ([facility]);  Surgeon: Tonny Branch, MD;  Location: [facility];  Service: Ophthalmology;  Laterality: Left;  CDE 13.00  . COLONOSCOPY  Sept 2008   Last colonoscopy/bx by Dr Rourk->(08/31/07) descending colon TUBULAR ADENOMA  . COLONOSCOPY  10/25/2012   Procedure: COLONOSCOPY;  Surgeon: Daneil Dolin, MD;  Location[facility];  Service: Endoscopy;  Laterality: N/A;  10:15-changed to 10:30 Darius Bump notified  . PARTIAL MASTECTOMY WITH NEEDLE LOCALIZATION AND AXILLARY SENTINEL LYMPH NODE BX Right 03/18/2015   Procedure: PARTIAL MASTECTOMY AFTER NEEDLE LOCALIZATION AND AXILLARY SENTINEL LYMPH NODE BX;  Surgeon: Aviva Signs Md, MD;  Location: [facility];  Service: General;  Laterality: Right;  . PTOSIS REPAIR Left 05/11/2017   Procedure: INTERNAL PTOSIS REPAIR OF LEFT EYELID;  Surgeon: Clista Bernhardt, MD;  Location: [facility];  Service: Ophthalmology;  Laterality: Left;  . TUBAL LIGATION  1970   hysterectomy     SOCIAL HISTORY:  Social History   Socioeconomic History  . Marital status: Widowed    Spouse name: Not on file  . Number of children: 3  . Years of education: 90  . Highest education level: Not on file  Occupational History  . Occupation: Lab Tech-retired  Social Needs  . Financial resource strain: Not on file  . Food insecurity    Worry: Not on file    Inability: Not on file  . Transportation needs    Medical: Not on file    Non-medical: Not on file  Tobacco Use  . Smoking status: Never Smoker  . Smokeless tobacco: Never Used  Substance and Sexual Activity  . Alcohol use: No  . Drug use: No  . Sexual activity: Never    Birth control/protection: Surgical  Lifestyle  . Physical activity    Days per week: Not on file    Minutes per session: Not on file  . Stress: Not on file  Relationships  . Social Herbalist on phone: Not on file    Gets together: Not on file    Attends religious service: Not on file    Active member of  club or organization: Not on file    Attends meetings of clubs or organizations: Not on file    Relationship status: Not on file  . Intimate partner violence    Fear of current or ex partner: Not on file    Emotionally abused: Not on file    Physically abused: Not on file    Forced sexual activity: Not on file  Other Topics Concern  . Not on file  Social History Narrative   Lives alone   Caffeine use: less than 8 oz per week   Right handed     FAMILY HISTORY:  Family History  Problem Relation Age of Onset  . Cancer Brother 17  . Coronary artery disease Brother   . Diabetes Sister   . Heart disease Father   . Heart disease Brother   . Anesthesia problems Neg Hx   . Hypotension  Neg Hx   . Pseudochol deficiency Neg Hx   . Malignant hyperthermia Neg Hx   . Colon cancer Neg Hx     CURRENT MEDICATIONS:  Outpatient Encounter Medications as of 08/23/2019  Medication Sig  . chlorpheniramine (CHLOR-TRIMETON) 4 MG tablet Take 4 mg by mouth daily. Pt takes daily for 2 weeks when she has a bad cough  . ibuprofen (ADVIL) 200 MG tablet Take 200 mg by mouth 2 (two) times daily.  Marland Kitchen acetaminophen (TYLENOL ARTHRITIS PAIN) 650 MG CR tablet Take 1,300 mg by mouth 2 (two) times daily.   Marland Kitchen amLODipine (NORVASC) 10 MG tablet Take 10 mg by mouth daily.  Marland Kitchen anastrozole (ARIMIDEX) 1 MG tablet TAKE 1 TABLET EVERY DAY  . Artificial Tear Solution (SOOTHE XP OP) Place 1 drop into both eyes 2 (two) times daily.  Marland Kitchen buPROPion (WELLBUTRIN SR) 150 MG 12 hr tablet Take 150 mg by mouth 2 (two) times daily.   . Calcium-Vitamin D (CALTRATE 600 PLUS-VIT D PO) Take 1 tablet by mouth daily.  Marland Kitchen FLUoxetine (PROZAC) 20 MG capsule Take 60 mg by mouth daily.   . fluticasone (FLONASE) 50 MCG/ACT nasal spray Place 2 sprays into both nostrils daily.   Marland Kitchen HYDROcodone-acetaminophen (NORCO/VICODIN) 5-325 MG tablet Take 1 tablet by mouth every 6 (six) hours as needed. (Patient not taking: Reported on 08/23/2019)  .  losartan-hydrochlorothiazide (HYZAAR) 100-25 MG per tablet Take 1 tablet by mouth daily.  . pantoprazole (PROTONIX) 40 MG tablet TAKE 1 TABLET BY MOUTH TWICE DAILY (Patient taking differently: TAKE 1 TABLET BY MOUTH DAILY 30 MINUTES BEFORE BREAKFAST)  . simvastatin (ZOCOR) 10 MG tablet Take 10 mg by mouth at bedtime.   . [DISCONTINUED] loratadine (CLARITIN) 10 MG tablet Take 10 mg by mouth daily.     No facility-administered encounter medications on file as of 08/23/2019.     ALLERGIES:  Allergies  Allergen Reactions  . Bactrim [Sulfamethoxazole-Trimethoprim] Nausea Only     PHYSICAL EXAM:  ECOG Performance status: 1  Vitals:   08/23/19 1151  BP: (!) 114/43  Pulse: 94  Resp: 16  Temp: (!) 96.9 F (36.1 C)  SpO2: 98%   Filed Weights   08/23/19 1151  Weight: 224 lb 1.6 oz (101.7 kg)    Physical Exam Constitutional:      Appearance: Normal appearance. She is normal weight.  Cardiovascular:     Rate and Rhythm: Normal rate and regular rhythm.     Heart sounds: Normal heart sounds.  Pulmonary:     Effort: Pulmonary effort is normal.     Breath sounds: Normal breath sounds.  Abdominal:     General: Bowel sounds are normal.     Palpations: Abdomen is soft.  Musculoskeletal: Normal range of motion.  Skin:    General: Skin is warm and dry.  Neurological:     Mental Status: She is alert and oriented to person, place, and time. Mental status is at baseline.  Psychiatric:        Mood and Affect: Mood normal.        Behavior: Behavior normal.        Thought Content: Thought content normal.        Judgment: Judgment normal.   Breast: No palpable masses, no skin changes or nipple discharge, no adenopathy.   LABORATORY DATA:  I have reviewed the labs as listed.  CBC    Component Value Date/Time   WBC 4.7 08/23/2019 1128   RBC 3.98 08/23/2019 1128   HGB  12.7 08/23/2019 1128   HCT 39.1 08/23/2019 1128   PLT 238 08/23/2019 1128   MCV 98.2 08/23/2019 1128   MCH 31.9  08/23/2019 1128   MCHC 32.5 08/23/2019 1128   RDW 13.1 08/23/2019 1128   LYMPHSABS 1.6 08/23/2019 1128   MONOABS 0.5 08/23/2019 1128   EOSABS 0.4 08/23/2019 1128   BASOSABS 0.0 08/23/2019 1128   CMP Latest Ref Rng & Units 08/23/2019 07/22/2019 02/21/2019  Glucose 70 - 99 mg/dL 96 100(H) 121(H)  BUN 8 - 23 mg/dL 17 14 19   Creatinine 0.44 - 1.00 mg/dL 0.76 0.85 0.87  Sodium 135 - 145 mmol/L 141 138 138  Potassium 3.5 - 5.1 mmol/L 4.4 4.1 4.0  Chloride 98 - 111 mmol/L 107 104 105  CO2 22 - 32 mmol/L 26 24 24   Calcium 8.9 - 10.3 mg/dL 9.4 9.7 9.5  Total Protein 6.5 - 8.1 g/dL 7.1 7.8 7.4  Total Bilirubin 0.3 - 1.2 mg/dL 0.7 0.7 0.6  Alkaline Phos 38 - 126 U/L 57 59 66  AST 15 - 41 U/L 25 23 25   ALT 0 - 44 U/L 17 18 17     DIAGNOSTIC IMAGING:  I have independently reviewed the mammogram scans and discussed with the patient.  I personally performed a face-to-face visit.  All questions were answered to patient's stated satisfaction. Encouraged patient to call with any new concerns or questions before his next visit to the cancer center and we can certain see him sooner, if needed.     ASSESSMENT & PLAN:   Breast cancer ([place]) 1.  Stage Ia right breast cancer: -0.9 cm, ER/PR positive, status post lumpectomy in February 2016, Oncotype DX score 4. -Status post XRT completed on 06/05/2015. -Anastrozole started on 05/2015.  She is tolerating it very well. -BCI testing in September 2018 showed low likelihood of benefit of extended adjuvant therapy. -Mammogram on 03/12/2019 which was B RADS category 2 benign. -Physical examination today did not reveal any suspicious masses or adenopathy. -Labs on 08/23/2019 showed her potassium 4.4, creatinine 0.76, LDH 181, WBC 4.7, hemoglobin 12.7, platelets 238. -She will follow-up in 6 months with repeat labs.  2.  Osteopenia: -DEXA scan was done on June 2018 with a T score of -1.1. -She is continuing calcium and vitamin D daily. -She was started on  Prolia 6 months injections on 07/29/2015.  She has tolerating them very well. -We will plan to repeat a DEXA scan in June 2021.      Orders placed this encounter:  Orders Placed This Encounter  Procedures  . MM DIAG BREAST TOMO BILATERAL  . Lactate dehydrogenase  . CBC with Differential/Platelet  . Comprehensive metabolic panel  . Vitamin B12  . VITAMIN D 25 Hydroxy (Vit-D Deficiency, Fractures)      Francene Finders, FNP-C [place] [phone]

## 2019-08-23 NOTE — Progress Notes (Signed)
Pt is taking arimidex as prescribed with no side effects.

## 2019-08-23 NOTE — Assessment & Plan Note (Addendum)
1.  Stage Ia right breast cancer: -0.9 cm, ER/PR positive, status post lumpectomy in February 2016, Oncotype DX score 4. -Status post XRT completed on 06/05/2015. -Anastrozole started on 05/2015.  She is tolerating it very well. -BCI testing in September 2018 showed low likelihood of benefit of extended adjuvant therapy. -Mammogram on 03/12/2019 which was B RADS category 2 benign. -Physical examination today did not reveal any suspicious masses or adenopathy. -Labs on 08/23/2019 showed her potassium 4.4, creatinine 0.76, LDH 181, WBC 4.7, hemoglobin 12.7, platelets 238. -She will follow-up in 6 months with repeat labs.  2.  Osteopenia: -DEXA scan was done on June 2018 with a T score of -1.1. -She is continuing calcium and vitamin D daily. -She was started on Prolia 6 months injections on 07/29/2015.  She has tolerating them very well. -We will plan to repeat a DEXA scan in June 2021.

## 2019-08-23 NOTE — Patient Instructions (Addendum)
[facility] at [facility]  Discharge Instructions: Follow up in 6 months with labs   You saw Randi Lockamy, NP, today. _______________________________________________________________  Thank you for choosing [facility] at [facility] to provide your oncology and hematology care.  To afford each patient quality time with our providers, please arrive at least 15 minutes before your scheduled appointment.  You need to re-schedule your appointment if you arrive 10 or more minutes late.  We strive to give you quality time with our providers, and arriving late affects you and other patients whose appointments are after yours.  Also, if you no show three or more times for appointments you may be dismissed from the clinic.  Again, thank you for choosing [facility] at [facility]. Our hope is that these requests will allow you access to exceptional care and in a timely manner. _______________________________________________________________  If you have questions after your visit, please contact our office at [phone] between the hours of 8:30 a.m. and 5:00 p.m. Voicemails left after 4:30 p.m. will not be returned until the following business day. _______________________________________________________________  For prescription refill requests, have your pharmacy contact our office. _______________________________________________________________  Recommendations made by the consultant and any test results will be sent to your referring physician. _______________________________________________________________ 

## 2019-09-17 ENCOUNTER — Other Ambulatory Visit: Payer: Self-pay

## 2019-09-17 ENCOUNTER — Emergency Department (HOSPITAL_COMMUNITY): Payer: Medicare HMO

## 2019-09-17 ENCOUNTER — Encounter (HOSPITAL_COMMUNITY): Payer: Self-pay | Admitting: Emergency Medicine

## 2019-09-17 ENCOUNTER — Emergency Department (HOSPITAL_COMMUNITY)
Admission: EM | Admit: 2019-09-17 | Discharge: 2019-09-17 | Disposition: A | Discharge status: Home / Self Care | Payer: Medicare HMO | Attending: Emergency Medicine | Admitting: Emergency Medicine

## 2019-09-17 DIAGNOSIS — M1611 Unilateral primary osteoarthritis, right hip: Secondary | ICD-10-CM | POA: Insufficient documentation

## 2019-09-17 DIAGNOSIS — I10 Essential (primary) hypertension: Secondary | ICD-10-CM | POA: Diagnosis not present

## 2019-09-17 DIAGNOSIS — M25551 Pain in right hip: Secondary | ICD-10-CM | POA: Diagnosis present

## 2019-09-17 DIAGNOSIS — Z79899 Other long term (current) drug therapy: Secondary | ICD-10-CM | POA: Diagnosis not present

## 2019-09-17 DIAGNOSIS — M533 Sacrococcygeal disorders, not elsewhere classified: Secondary | ICD-10-CM | POA: Diagnosis not present

## 2019-09-17 MED ORDER — PREDNISONE 50 MG PO TABS
60.0000 mg | ORAL_TABLET | Freq: Once | ORAL | Status: AC
  Administered 2019-09-17: 60 mg via ORAL
  Filled 2019-09-17: qty 1

## 2019-09-17 NOTE — ED Provider Notes (Signed)
[facility] EMERGENCY DEPARTMENT Provider Note   CSN: [id number] Arrival date & time: 09/17/19  1303     History   Chief Complaint Chief Complaint  Patient presents with  . Hip Pain    HPI Crystal [name] is a 78 y.o. female with a history of gerd, HTN, treated breast cancer, ischemic colitis, lumbar spinal stenosis treated by neurosurgery with steroid injections followed by PT, osteoarthritis, osteopenia and sleep apnea presenting with a 1 week history of right hip pain triggered by weight bearing and walking, resolved with sitting, lying down.  She denies fevers, chills, injury, swelling. She reports her pain is similar to pain she experienced before her last set of steroid injections. She has taken hydrocodone (prescribed here at a prior ed visit) with no significant improvement in symptoms.      The history is provided by the patient.    Past Medical History:  Diagnosis Date  . Breast cancer ([place]) 03/21/15   right  . Chronic fatigue   . Colitis, ischemic ([place]) 08/2007, 05/2011   Last colonoscopy/bx by Dr Rourk->(08/31/07) descending colon  . Depression   . Diverticulosis   . Enlarged thyroid   . Gait abnormality 10/18/2017  . GERD (gastroesophageal reflux disease)   . HTN (hypertension)   . Lumbar spinal stenosis 11/07/2017   Severe at L4-5, moderate at L3-4  . 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 OA (osteoarthritis)   . Obesity   . PONV (postoperative nausea and vomiting)   . Ptosis of eyelid, left   . Sleep apnea    cpap  . Spinal stenosis   . Tubular adenoma of colon 08/2007    Patient Active Problem List   Diagnosis Date Noted  . Lumbar spinal stenosis 11/07/2017  . Gait abnormality 10/18/2017  . 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 Osteopenia determined by x-ray 07/23/2015  . Breast cancer ([place]) 03/31/2015  . Colitis, ischemic ([place]) 06/27/2011  . Tubular adenoma 06/27/2011    Past Surgical History:  Procedure Laterality Date  . ABDOMINAL HYSTERECTOMY     partial  .  BREAST CYST EXCISION     benign, left x2, right x1  . CATARACT EXTRACTION W/PHACO  12/01/2011   Procedure: CATARACT EXTRACTION PHACO AND INTRAOCULAR LENS PLACEMENT (IOC);  Surgeon: Tonny Branch;  Location: [facility];  Service: Ophthalmology;  Laterality: Right;  CDE=14.87  . CATARACT EXTRACTION W/PHACO  02/06/2012   Procedure: CATARACT EXTRACTION PHACO AND INTRAOCULAR LENS PLACEMENT (IOC);  Surgeon: Tonny Branch, MD;  Location: [facility];  Service: Ophthalmology;  Laterality: Left;  CDE 13.00  . COLONOSCOPY  Sept 2008   Last colonoscopy/bx by Dr Rourk->(08/31/07) descending colon TUBULAR ADENOMA  . COLONOSCOPY  10/25/2012   Procedure: COLONOSCOPY;  Surgeon: Daneil Dolin, MD;  [facility]: [facility];  Service: Endoscopy;  Laterality: N/A;  10:15-changed to 10:30 Darius Bump notified  . PARTIAL MASTECTOMY WITH NEEDLE LOCALIZATION AND AXILLARY SENTINEL LYMPH NODE BX Right 03/18/2015   Procedure: PARTIAL MASTECTOMY AFTER NEEDLE LOCALIZATION AND AXILLARY SENTINEL LYMPH NODE BX;  Surgeon: Aviva Signs Md, MD;  Location: [facility];  Service: General;  Laterality: Right;  . PTOSIS REPAIR Left 05/11/2017   Procedure: INTERNAL PTOSIS REPAIR OF LEFT EYELID;  Surgeon: Clista Bernhardt, MD;  Location: [place];  Service: Ophthalmology;  Laterality: Left;  . TUBAL LIGATION  1970   hysterectomy     OB History   No obstetric history on file.      Home Medications    Prior to Admission medications  Medication Sig Start Date End Date Taking? Authorizing Provider  acetaminophen (TYLENOL ARTHRITIS PAIN) 650 MG CR tablet Take 1,300 mg by mouth 2 (two) times daily.    Yes [provider]  amLODipine (NORVASC) 10 MG tablet Take 10 mg by mouth daily.   Yes [provider]  anastrozole (ARIMIDEX) 1 MG tablet TAKE 1 TABLET EVERY DAY 07/17/19  Yes Derek Jack, MD  Artificial Tear Solution (SOOTHE XP OP) Place 1 drop into both eyes 2 (two) times daily.   Yes [provider]  buPROPion  (WELLBUTRIN SR) 150 MG 12 hr tablet Take 150 mg by mouth 2 (two) times daily.  06/02/11  Yes [provider]  Calcium-Vitamin D (CALTRATE 600 PLUS-VIT D PO) Take 1 tablet by mouth daily.   Yes [provider]  FLUoxetine (PROZAC) 20 MG capsule Take 60 mg by mouth daily.  06/23/11  Yes [provider]  fluticasone (FLONASE) 50 MCG/ACT nasal spray Place 2 sprays into both nostrils daily.    Yes [provider]  ibuprofen (ADVIL) 200 MG tablet Take 200 mg by mouth 2 (two) times daily.   Yes [provider]  losartan-hydrochlorothiazide (HYZAAR) 100-25 MG per tablet Take 1 tablet by mouth daily.   Yes [provider]  pantoprazole (PROTONIX) 40 MG tablet TAKE 1 TABLET BY MOUTH TWICE DAILY Patient taking differently: TAKE 1 TABLET BY MOUTH DAILY 30 MINUTES BEFORE BREAKFAST 12/14/15  Yes Mannam, Praveen, MD  simvastatin (ZOCOR) 10 MG tablet Take 10 mg by mouth at bedtime.    Yes [provider]  chlorpheniramine (CHLOR-TRIMETON) 4 MG tablet Take 4 mg by mouth daily. Pt takes daily for 2 weeks when she has a bad cough    [provider]  HYDROcodone-acetaminophen (NORCO/VICODIN) 5-325 MG tablet Take 1 tablet by mouth every 6 (six) hours as needed. Patient not taking: Reported on 08/23/2019 07/22/19   Milton Ferguson, MD    Family History Family History  Problem Relation Age of Onset  . Cancer Brother 48  . Coronary artery disease Brother   . Diabetes Sister   . Heart disease Father   . Heart disease Brother   . Anesthesia problems Neg Hx   . Hypotension Neg Hx   . Pseudochol deficiency Neg Hx   . Malignant hyperthermia Neg Hx   . Colon cancer Neg Hx     Social History Social History   Tobacco Use  . Smoking status: Never Smoker  . Smokeless tobacco: Never Used  Substance Use Topics  . Alcohol use: No  . Drug use: No     Allergies   Bactrim [sulfamethoxazole-trimethoprim]   Review of Systems Review of Systems   Constitutional: Negative for chills and fever.  HENT: Negative for congestion and sore throat.   Eyes: Negative.   Respiratory: Negative for chest tightness and shortness of breath.   Cardiovascular: Negative for chest pain and palpitations.  Gastrointestinal: Negative for abdominal pain, nausea and vomiting.  Genitourinary: Negative.   Musculoskeletal: Positive for arthralgias. Negative for joint swelling and neck pain.  Skin: Negative.  Negative for rash and wound.  Neurological: Negative for dizziness, weakness, light-headedness, numbness and headaches.  Psychiatric/Behavioral: Negative.      Physical Exam Updated Vital Signs BP 140/60   Pulse 69   Temp 98.3 F (36.8 C) (Oral)   Resp 17   Ht 5\' 3"  (1.6 m)   Wt 99.8 kg   SpO2 95%   BMI 38.97 kg/m   Pt remained on  monitor while here,  Pulse rate 75-80, no bradycardia.  Suspect rate documented in triage erroneous given pvc's noted on ekg. Pt has no weakness, dizziness, no sx of palpitations.  Physical Exam Vitals signs and nursing note reviewed.  Constitutional:      Appearance: She is well-developed.  HENT:     Head: Atraumatic.  Neck:     Musculoskeletal: Normal range of motion.  Cardiovascular:     Pulses:          Dorsalis pedis pulses are 2+ on the right side and 2+ on the left side.     Comments: Pulses equal bilaterally Musculoskeletal:        General: Tenderness present. No swelling or deformity.       Legs:     Comments: Pt's pain localized to the right SI joint. No anterior groin or lateral hip pain.    Skin:    General: Skin is warm and dry.  Neurological:     Mental Status: She is alert.     Sensory: No sensory deficit.     Motor: Motor function is intact.     Coordination: Coordination is intact.     Gait: Gait normal.     Deep Tendon Reflexes: Reflexes normal.      ED Treatments / Results  Labs (all labs ordered are listed, but only abnormal results are displayed) Labs Reviewed - No data to  display  EKG EKG Interpretation  Date/Time:  Tuesday September 17 2019 13:31:32 EDT Ventricular Rate:  78 PR Interval:    QRS Duration: 93 QT Interval:  386 QTC Calculation: 440 R Axis:   -52 Text Interpretation:  Sinus rhythm Multiple premature complexes, vent & supraven Left anterior fascicular block Low voltage, precordial leads Confirmed by Virgel Manifold [phone]) on 09/17/2019 2:31:32 PM   Radiology Dg Hip Unilat W Or Wo Pelvis 2-3 Views Right  Result Date: 09/17/2019 CLINICAL DATA:  Posterior right hip pain for 3 days. No known injury. EXAM: DG HIP (WITH OR WITHOUT PELVIS) 2-3V RIGHT COMPARISON:  08/03/2017 FINDINGS: No acute fracture or dislocation. Moderate osteoarthritis of the right hip, similar to prior. Greater trochanteric enthesopathic changes. Mild degeneration of the bilateral SI joints and pubic symphysis. Lower lumbar spondylosis. Vascular calcifications. IMPRESSION: Degenerative changes of the right hip without fracture or dislocation. Electronically Signed   By: Davina Poke M.D.   On: 09/17/2019 13:56    Procedures Procedures (including critical care time)  Medications Ordered in ED Medications  predniSONE (DELTASONE) tablet 60 mg (has no administration in time range)     Initial Impression / Assessment and Plan / ED Course  I have reviewed the triage vital signs and the nursing notes.  Pertinent labs & imaging results that were available during my care of the patient were reviewed by me and considered in my medical decision making (see chart for details).        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  Placed on prednisone taper, encouraged f/u with Dr. Brien Few for further eval/management.   Final Clinical Impressions(s) / ED Diagnoses   Final diagnoses:  Primary osteoarthritis of  right hip  SI (sacroiliac) pain    ED Discharge Orders    None       Landis Martins 09/17/19 1507    Virgel Manifold, MD 09/19/19  1049  

## 2019-09-17 NOTE — ED Notes (Signed)
ED Provider at bedside. 

## 2019-09-17 NOTE — Discharge Instructions (Addendum)
You may try ice or heat as discussed.  Take your next dose of prednisone tomorrow - this is a strong anti inflammatory pain reliever.  Call Dr. Brien Few as discussed for further management of this pain.  You may benefit from another course of steroid injections at this site if your symptoms are not improving.

## 2019-09-17 NOTE — ED Triage Notes (Addendum)
Pt c/o RT hip pain since Friday. Denies injury or fall. Pt noted to have HR ranging from 39-84. Denies hx of bradycardia.

## 2019-09-30 DIAGNOSIS — Z23 Encounter for immunization: Secondary | ICD-10-CM | POA: Diagnosis not present

## 2019-10-10 DIAGNOSIS — M47818 Spondylosis without myelopathy or radiculopathy, sacral and sacrococcygeal region: Secondary | ICD-10-CM | POA: Diagnosis not present

## 2019-10-10 DIAGNOSIS — M48062 Spinal stenosis, lumbar region with neurogenic claudication: Secondary | ICD-10-CM | POA: Diagnosis not present

## 2019-10-10 DIAGNOSIS — M7918 Myalgia, other site: Secondary | ICD-10-CM | POA: Diagnosis not present

## 2019-10-10 DIAGNOSIS — M461 Sacroiliitis, not elsewhere classified: Secondary | ICD-10-CM | POA: Diagnosis not present

## 2019-10-30 DIAGNOSIS — Z6838 Body mass index (BMI) 38.0-38.9, adult: Secondary | ICD-10-CM | POA: Diagnosis not present

## 2019-10-30 DIAGNOSIS — M461 Sacroiliitis, not elsewhere classified: Secondary | ICD-10-CM | POA: Diagnosis not present

## 2019-10-30 DIAGNOSIS — I10 Essential (primary) hypertension: Secondary | ICD-10-CM | POA: Diagnosis not present

## 2019-10-31 ENCOUNTER — Emergency Department (HOSPITAL_COMMUNITY): Payer: Medicare HMO

## 2019-10-31 ENCOUNTER — Encounter (HOSPITAL_COMMUNITY): Payer: Self-pay

## 2019-10-31 ENCOUNTER — Other Ambulatory Visit: Payer: Self-pay

## 2019-10-31 ENCOUNTER — Emergency Department (HOSPITAL_COMMUNITY)
Admission: EM | Admit: 2019-10-31 | Discharge: 2019-10-31 | Disposition: A | Discharge status: Home / Self Care | Payer: Medicare HMO | Attending: Emergency Medicine | Admitting: Emergency Medicine

## 2019-10-31 DIAGNOSIS — S99922A Unspecified injury of left foot, initial encounter: Secondary | ICD-10-CM | POA: Diagnosis present

## 2019-10-31 DIAGNOSIS — Z79899 Other long term (current) drug therapy: Secondary | ICD-10-CM | POA: Diagnosis not present

## 2019-10-31 DIAGNOSIS — S92515A Nondisplaced fracture of proximal phalanx of left lesser toe(s), initial encounter for closed fracture: Secondary | ICD-10-CM | POA: Insufficient documentation

## 2019-10-31 DIAGNOSIS — Y939 Activity, unspecified: Secondary | ICD-10-CM | POA: Diagnosis not present

## 2019-10-31 DIAGNOSIS — X58XXXA Exposure to other specified factors, initial encounter: Secondary | ICD-10-CM | POA: Insufficient documentation

## 2019-10-31 DIAGNOSIS — Y929 Unspecified place or not applicable: Secondary | ICD-10-CM | POA: Diagnosis not present

## 2019-10-31 DIAGNOSIS — Y999 Unspecified external cause status: Secondary | ICD-10-CM | POA: Diagnosis not present

## 2019-10-31 DIAGNOSIS — I10 Essential (primary) hypertension: Secondary | ICD-10-CM | POA: Insufficient documentation

## 2019-10-31 DIAGNOSIS — L03116 Cellulitis of left lower limb: Secondary | ICD-10-CM | POA: Insufficient documentation

## 2019-10-31 DIAGNOSIS — S92512A Displaced fracture of proximal phalanx of left lesser toe(s), initial encounter for closed fracture: Secondary | ICD-10-CM | POA: Diagnosis not present

## 2019-10-31 LAB — CBC WITH DIFFERENTIAL/PLATELET
Abs Immature Granulocytes: 0.02 10*3/uL (ref 0.00–0.07)
Basophils Absolute: 0 10*3/uL (ref 0.0–0.1)
Basophils Relative: 0 %
Eosinophils Absolute: 0 10*3/uL (ref 0.0–0.5)
Eosinophils Relative: 0 %
HCT: 40.1 % (ref 36.0–46.0)
Hemoglobin: 13.2 g/dL (ref 12.0–15.0)
Immature Granulocytes: 0 %
Lymphocytes Relative: 9 %
Lymphs Abs: 0.8 10*3/uL (ref 0.7–4.0)
MCH: 32.5 pg (ref 26.0–34.0)
MCHC: 32.9 g/dL (ref 30.0–36.0)
MCV: 98.8 fL (ref 80.0–100.0)
Monocytes Absolute: 0.8 10*3/uL (ref 0.1–1.0)
Monocytes Relative: 9 %
Neutro Abs: 7.7 10*3/uL (ref 1.7–7.7)
Neutrophils Relative %: 82 %
Platelets: 201 10*3/uL (ref 150–400)
RBC: 4.06 MIL/uL (ref 3.87–5.11)
RDW: 13.1 % (ref 11.5–15.5)
WBC: 9.3 10*3/uL (ref 4.0–10.5)
nRBC: 0 % (ref 0.0–0.2)

## 2019-10-31 LAB — COMPREHENSIVE METABOLIC PANEL
ALT: 21 U/L (ref 0–44)
AST: 26 U/L (ref 15–41)
Albumin: 4.4 g/dL (ref 3.5–5.0)
Alkaline Phosphatase: 62 U/L (ref 38–126)
Anion gap: 9 (ref 5–15)
BUN: 24 mg/dL — ABNORMAL HIGH (ref 8–23)
CO2: 25 mmol/L (ref 22–32)
Calcium: 9.3 mg/dL (ref 8.9–10.3)
Chloride: 104 mmol/L (ref 98–111)
Creatinine, Ser: 0.7 mg/dL (ref 0.44–1.00)
GFR calc Af Amer: >60 mL/min (ref >60)
GFR calc non Af Amer: >60 mL/min (ref >60)
Glucose, Bld: 115 mg/dL — ABNORMAL HIGH (ref 70–99)
Potassium: 3.8 mmol/L (ref 3.5–5.1)
Sodium: 138 mmol/L (ref 135–145)
Total Bilirubin: 0.6 mg/dL (ref 0.3–1.2)
Total Protein: 7.3 g/dL (ref 6.5–8.1)

## 2019-10-31 MED ORDER — OXYCODONE-ACETAMINOPHEN 5-325 MG PO TABS: 2.0000 | ORAL_TABLET | ORAL | 0 refills | Status: DC | PRN

## 2019-10-31 MED ORDER — CEPHALEXIN 500 MG PO CAPS: 500.0000 mg | ORAL_CAPSULE | Freq: Four times a day (QID) | ORAL | 0 refills | Status: DC

## 2019-10-31 MED ORDER — FENTANYL CITRATE (PF) 100 MCG/2ML IJ SOLN
50.0000 ug | Freq: Once | INTRAMUSCULAR | Status: AC
  Administered 2019-10-31: 50 ug via INTRAVENOUS
  Filled 2019-10-31: qty 2

## 2019-10-31 MED ORDER — LACTATED RINGERS IV BOLUS
1000.0000 mL | Freq: Once | INTRAVENOUS | Status: AC
  Administered 2019-10-31: 03:00:00 1000 mL via INTRAVENOUS

## 2019-10-31 MED ORDER — SODIUM CHLORIDE 0.9 % IV SOLN
1.0000 g | Freq: Once | INTRAVENOUS | Status: AC
  Administered 2019-10-31: 1 g via INTRAVENOUS
  Filled 2019-10-31: qty 10

## 2019-10-31 NOTE — ED Triage Notes (Signed)
Pt reports left foot pain (heel) since this morning when she went to her doctor and given pain injections for her hip pain. Pt denies injury.

## 2019-10-31 NOTE — ED Provider Notes (Signed)
[facility] EMERGENCY DEPARTMENT Provider Note   CSN: [id number] Arrival date & time: 10/31/19  [id number]     History   Chief Complaint Chief Complaint  Patient presents with   Foot Pain    HPI Crystal [name] is a 78 y.o. female.      Foot Pain This is a new problem. The current episode started 2 days ago. The problem occurs constantly. The problem has not changed since onset.Pertinent negatives include no chest pain. Nothing aggravates the symptoms. Nothing relieves the symptoms. She has tried nothing for the symptoms.    Past Medical History:  Diagnosis Date   Breast cancer ([place]) 03/21/15   right   Chronic fatigue    Colitis, ischemic ([place]) 08/2007, 05/2011   Last colonoscopy/bx by Dr Rourk->(08/31/07) descending colon   Depression    Diverticulosis    Enlarged thyroid    Gait abnormality 10/18/2017   GERD (gastroesophageal reflux disease)    HTN (hypertension)    Lumbar spinal stenosis 11/07/2017   Severe at L4-5, moderate at L3-4   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   OA (osteoarthritis)    Obesity    PONV (postoperative nausea and vomiting)    Ptosis of eyelid, left    Sleep apnea    cpap   Spinal stenosis    Tubular adenoma of colon 08/2007    Patient Active Problem List   Diagnosis Date Noted   Lumbar spinal stenosis 11/07/2017   Gait abnormality 10/18/2017   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   Osteopenia determined by x-ray 07/23/2015   Breast cancer ([place]) 03/31/2015   Colitis, ischemic ([place]) 06/27/2011   Tubular adenoma 06/27/2011    Past Surgical History:  Procedure Laterality Date   ABDOMINAL HYSTERECTOMY     partial   BREAST CYST EXCISION     benign, left x2, right x1   CATARACT EXTRACTION W/PHACO  12/01/2011   Procedure: CATARACT EXTRACTION PHACO AND INTRAOCULAR LENS PLACEMENT ([place]);  Surgeon: Tonny Branch;  Location: [facility];  Service: Ophthalmology;  Laterality: Right;  CDE=14.87   CATARACT EXTRACTION W/PHACO  02/06/2012   Procedure: CATARACT EXTRACTION PHACO AND INTRAOCULAR LENS PLACEMENT (IOC);  Surgeon: Tonny Branch, MD;  Location: [facility];  Service: Ophthalmology;  Laterality: Left;  CDE 13.00   COLONOSCOPY  Sept 2008   Last colonoscopy/bx by Dr Rourk->(08/31/07) descending colon TUBULAR ADENOMA   COLONOSCOPY  10/25/2012   Procedure: COLONOSCOPY;  Surgeon: Daneil Dolin, MD;  [facility];  Service: Endoscopy;  Laterality: N/A;  10:15-changed to 10:30 Leigh Ann notified   PARTIAL MASTECTOMY WITH NEEDLE LOCALIZATION AND AXILLARY SENTINEL LYMPH NODE BX Right 03/18/2015   Procedure: PARTIAL MASTECTOMY AFTER NEEDLE LOCALIZATION AND AXILLARY SENTINEL LYMPH NODE BX;  Surgeon: Aviva Signs Md, MD;  Location: [facility];  Service: General;  Laterality: Right;   PTOSIS REPAIR Left 05/11/2017   Procedure: INTERNAL PTOSIS REPAIR OF LEFT EYELID;  Surgeon: Clista Bernhardt, MD;  Location: [place];  Service: Ophthalmology;  Laterality: Left;   TUBAL LIGATION  1970   hysterectomy     OB History   No obstetric history on file.      Home Medications    Prior to Admission medications   Medication Sig Start Date End Date Taking? Authorizing Provider  acetaminophen (TYLENOL ARTHRITIS PAIN) 650 MG CR tablet Take 1,300 mg by mouth 2 (two) times daily.     [provider]  amLODipine (NORVASC) 10 MG tablet Take 10 mg by mouth daily.    [provider]  anastrozole (ARIMIDEX) 1 MG tablet TAKE 1 TABLET EVERY DAY 07/17/19   Derek Jack, MD  Artificial Tear Solution (SOOTHE XP OP) Place 1 drop into both eyes 2 (two) times daily.    [provider]  buPROPion (WELLBUTRIN SR) 150 MG 12 hr tablet Take 150 mg by mouth 2 (two) times daily.  06/02/11   [provider]  Calcium-Vitamin D (CALTRATE 600 PLUS-VIT D PO) Take 1 tablet by mouth daily.    [provider]  cephALEXin (KEFLEX) 500 MG capsule Take 1 capsule (500 mg total) by mouth 4 (four) times daily. 10/31/19   Marcela Alatorre,  Corene Cornea, MD  chlorpheniramine (CHLOR-TRIMETON) 4 MG tablet Take 4 mg by mouth daily. Pt takes daily for 2 weeks when she has a bad cough    [provider]  FLUoxetine (PROZAC) 20 MG capsule Take 60 mg by mouth daily.  06/23/11   [provider]  fluticasone (FLONASE) 50 MCG/ACT nasal spray Place 2 sprays into both nostrils daily.     [provider]  HYDROcodone-acetaminophen (NORCO/VICODIN) 5-325 MG tablet Take 1 tablet by mouth every 6 (six) hours as needed. Patient not taking: Reported on 08/23/2019 07/22/19   Milton Ferguson, MD  ibuprofen (ADVIL) 200 MG tablet Take 200 mg by mouth 2 (two) times daily.    [provider]  losartan-hydrochlorothiazide (HYZAAR) 100-25 MG per tablet Take 1 tablet by mouth daily.    [provider]  oxyCODONE-acetaminophen (PERCOCET) 5-325 MG tablet Take 2 tablets by mouth every 4 (four) hours as needed. 10/31/19   Veasna Santibanez, Corene Cornea, MD  pantoprazole (PROTONIX) 40 MG tablet TAKE 1 TABLET BY MOUTH TWICE DAILY Patient taking differently: TAKE 1 TABLET BY MOUTH DAILY 30 MINUTES BEFORE BREAKFAST 12/14/15   Mannam, Praveen, MD  simvastatin (ZOCOR) 10 MG tablet Take 10 mg by mouth at bedtime.     [provider]    Family History Family History  Problem Relation Age of Onset   Cancer Brother 72   Coronary artery disease Brother    Diabetes Sister    Heart disease Father    Heart disease Brother    Anesthesia problems Neg Hx    Hypotension Neg Hx    Pseudochol deficiency Neg Hx    Malignant hyperthermia Neg Hx    Colon cancer Neg Hx     Social History Social History   Tobacco Use   Smoking status: Never Smoker   Smokeless tobacco: Never Used  Substance Use Topics   Alcohol use: No   Drug use: No     Allergies   Bactrim [sulfamethoxazole-trimethoprim]   Review of Systems Review of Systems  Cardiovascular: Negative for chest pain.  All other systems reviewed and are  negative.    Physical Exam Updated Vital Signs BP (!) 127/93 (BP Location: Left Arm)    Pulse (!) 109    Temp 98.6 F (37 C) (Oral)    Resp 20    Wt 99.8 kg    SpO2 100%    BMI 38.97 kg/m   Physical Exam Vitals signs and nursing note reviewed.  Constitutional:      Appearance: She is well-developed.  HENT:     Head: Normocephalic and atraumatic.     Mouth/Throat:     Mouth: Mucous membranes are dry.     Pharynx: Oropharynx is clear.  Eyes:     Conjunctiva/sclera: Conjunctivae normal.  Neck:     Musculoskeletal: Normal range of motion.  Cardiovascular:  Rate and Rhythm: Normal rate and regular rhythm.  Pulmonary:     Effort: No respiratory distress.     Breath sounds: No stridor.  Abdominal:     General: There is no distension.  Musculoskeletal: Normal range of motion.        General: No swelling or tenderness.  Skin:    General: Skin is warm and dry.     Comments: Erythema, warmth, ttp, induration to left heel.  TTP to left 4th proximal phalanx with associated ecchymosis.  Ecchymosis to left shin.   Neurological:     General: No focal deficit present.     Mental Status: She is alert.      ED Treatments / Results  Labs (all labs ordered are listed, but only abnormal results are displayed) Labs Reviewed  COMPREHENSIVE METABOLIC PANEL - Abnormal; Notable for the following components:      Result Value   Glucose, Bld 115 (*)    BUN 24 (*)    All other components within normal limits  CBC WITH DIFFERENTIAL/PLATELET    EKG None  Radiology Dg Foot Complete Left  Result Date: 10/31/2019 CLINICAL DATA:  Left heel pain EXAM: LEFT FOOT - COMPLETE 3+ VIEW COMPARISON:  None. FINDINGS: There is an acute comminuted intra-articular fracture involving the proximal phalanx of the fourth digit. There is no dislocation. There is some mild surrounding soft tissue swelling. There are degenerative changes of the first metatarsophalangeal joint with bunion formation and  hallux valgus. There is likely pes planus. There is a moderate plantar calcaneal spur. Evaluation is limited by a suboptimal lateral view of the digits which are outside the field of view. IMPRESSION: 1. Acute fracture of the proximal phalanx of the fourth digit. 2. Degenerative changes of the first metatarsophalangeal joint with probable hallux valgus. 3. Small plantar calcaneal spur with findings suggestive of pes planus. Electronically Signed   By: Constance Holster M.D.   On: 10/31/2019 02:45    Procedures Procedures (including critical care time)  Medications Ordered in ED Medications  lactated ringers bolus 1,000 mL (0 mLs Intravenous Stopped 10/31/19 0435)  fentaNYL (SUBLIMAZE) injection 50 mcg (50 mcg Intravenous Given 10/31/19 0203)  cefTRIAXone (ROCEPHIN) 1 g in sodium chloride 0.9 % 100 mL IVPB (0 g Intravenous Stopped 10/31/19 0435)     Initial Impression / Assessment and Plan / ED Course  I have reviewed the triage vital signs and the nursing notes.  Pertinent labs & imaging results that were available during my care of the patient were reviewed by me and considered in my medical decision making (see chart for details).   Likely cellulitis to left heel also with proximal phalynx fracture. abx srtarted, hr improved. No e/o for sepsis. Pt likely broke two a week ago and not having pain now. Doesn't want further treatmetn at [facility] time.   Final Clinical Impressions(s) / ED Diagnoses   Final diagnoses:  Cellulitis of left lower extremity  Closed nondisplaced fracture of proximal phalanx of lesser toe of left foot, initial encounter    ED Discharge Orders         Ordered    cephALEXin (KEFLEX) 500 MG capsule  4 times daily     10/31/19 0350    oxyCODONE-acetaminophen (PERCOCET) 5-325 MG tablet  Every 4 hours PRN     10/31/19 0350           Aisia Correira, Corene Cornea, MD 10/31/19 [id number]

## 2019-11-01 DIAGNOSIS — M1000 Idiopathic gout, unspecified site: Secondary | ICD-10-CM | POA: Diagnosis not present

## 2019-11-01 DIAGNOSIS — S92592A Other fracture of left lesser toe(s), initial encounter for closed fracture: Secondary | ICD-10-CM | POA: Diagnosis not present

## 2019-11-04 DIAGNOSIS — M10072 Idiopathic gout, left ankle and foot: Secondary | ICD-10-CM | POA: Diagnosis not present

## 2019-11-18 DIAGNOSIS — I10 Essential (primary) hypertension: Secondary | ICD-10-CM | POA: Diagnosis not present

## 2019-11-18 DIAGNOSIS — G4733 Obstructive sleep apnea (adult) (pediatric): Secondary | ICD-10-CM | POA: Diagnosis not present

## 2019-12-05 ENCOUNTER — Other Ambulatory Visit (HOSPITAL_COMMUNITY): Payer: Self-pay | Admitting: Hematology

## 2019-12-05 DIAGNOSIS — C50411 Malignant neoplasm of upper-outer quadrant of right female breast: Secondary | ICD-10-CM

## 2020-02-24 ENCOUNTER — Inpatient Hospital Stay (HOSPITAL_BASED_OUTPATIENT_CLINIC_OR_DEPARTMENT_OTHER): Payer: Medicare Other | Admitting: Hematology

## 2020-02-24 ENCOUNTER — Inpatient Hospital Stay (HOSPITAL_COMMUNITY): Payer: Medicare Other

## 2020-02-24 ENCOUNTER — Other Ambulatory Visit: Payer: Self-pay

## 2020-02-24 ENCOUNTER — Inpatient Hospital Stay (HOSPITAL_COMMUNITY): Payer: Medicare Other | Attending: Hematology

## 2020-02-24 ENCOUNTER — Encounter (HOSPITAL_COMMUNITY): Payer: Self-pay | Admitting: Hematology

## 2020-02-24 DIAGNOSIS — M8580 Other specified disorders of bone density and structure, unspecified site: Secondary | ICD-10-CM

## 2020-02-24 DIAGNOSIS — Z791 Long term (current) use of non-steroidal anti-inflammatories (NSAID): Secondary | ICD-10-CM | POA: Diagnosis not present

## 2020-02-24 DIAGNOSIS — C50411 Malignant neoplasm of upper-outer quadrant of right female breast: Secondary | ICD-10-CM | POA: Diagnosis not present

## 2020-02-24 DIAGNOSIS — K219 Gastro-esophageal reflux disease without esophagitis: Secondary | ICD-10-CM | POA: Insufficient documentation

## 2020-02-24 DIAGNOSIS — M1990 Unspecified osteoarthritis, unspecified site: Secondary | ICD-10-CM | POA: Diagnosis not present

## 2020-02-24 DIAGNOSIS — G479 Sleep disorder, unspecified: Secondary | ICD-10-CM | POA: Insufficient documentation

## 2020-02-24 DIAGNOSIS — Z79899 Other long term (current) drug therapy: Secondary | ICD-10-CM | POA: Insufficient documentation

## 2020-02-24 DIAGNOSIS — I10 Essential (primary) hypertension: Secondary | ICD-10-CM | POA: Diagnosis not present

## 2020-02-24 DIAGNOSIS — Z7952 Long term (current) use of systemic steroids: Secondary | ICD-10-CM | POA: Insufficient documentation

## 2020-02-24 DIAGNOSIS — Z923 Personal history of irradiation: Secondary | ICD-10-CM | POA: Diagnosis not present

## 2020-02-24 DIAGNOSIS — Z170 Estrogen receptor positive status [ER+]: Secondary | ICD-10-CM | POA: Insufficient documentation

## 2020-02-24 DIAGNOSIS — M545 Low back pain: Secondary | ICD-10-CM | POA: Diagnosis not present

## 2020-02-24 DIAGNOSIS — F329 Major depressive disorder, single episode, unspecified: Secondary | ICD-10-CM | POA: Insufficient documentation

## 2020-02-24 DIAGNOSIS — Z79811 Long term (current) use of aromatase inhibitors: Secondary | ICD-10-CM | POA: Diagnosis not present

## 2020-02-24 DIAGNOSIS — R200 Anesthesia of skin: Secondary | ICD-10-CM | POA: Diagnosis not present

## 2020-02-24 LAB — COMPREHENSIVE METABOLIC PANEL
ALT: 14 U/L (ref 0–44)
AST: 21 U/L (ref 15–41)
Albumin: 4.3 g/dL (ref 3.5–5.0)
Alkaline Phosphatase: 63 U/L (ref 38–126)
Anion gap: 11 (ref 5–15)
BUN: 26 mg/dL — ABNORMAL HIGH (ref 8–23)
CO2: 25 mmol/L (ref 22–32)
Calcium: 9.1 mg/dL (ref 8.9–10.3)
Chloride: 103 mmol/L (ref 98–111)
Creatinine, Ser: 0.73 mg/dL (ref 0.44–1.00)
GFR calc Af Amer: >60 mL/min (ref >60)
GFR calc non Af Amer: >60 mL/min (ref >60)
Glucose, Bld: 101 mg/dL — ABNORMAL HIGH (ref 70–99)
Potassium: 3.9 mmol/L (ref 3.5–5.1)
Sodium: 139 mmol/L (ref 135–145)
Total Bilirubin: 0.6 mg/dL (ref 0.3–1.2)
Total Protein: 7.4 g/dL (ref 6.5–8.1)

## 2020-02-24 LAB — CBC WITH DIFFERENTIAL/PLATELET
Abs Immature Granulocytes: 0.01 10*3/uL (ref 0.00–0.07)
Basophils Absolute: 0 10*3/uL (ref 0.0–0.1)
Basophils Relative: 1 %
Eosinophils Absolute: 0.2 10*3/uL (ref 0.0–0.5)
Eosinophils Relative: 5 %
HCT: 40.7 % (ref 36.0–46.0)
Hemoglobin: 13.5 g/dL (ref 12.0–15.0)
Immature Granulocytes: 0 %
Lymphocytes Relative: 28 %
Lymphs Abs: 1.4 10*3/uL (ref 0.7–4.0)
MCH: 32 pg (ref 26.0–34.0)
MCHC: 33.2 g/dL (ref 30.0–36.0)
MCV: 96.4 fL (ref 80.0–100.0)
Monocytes Absolute: 0.5 10*3/uL (ref 0.1–1.0)
Monocytes Relative: 10 %
Neutro Abs: 2.9 10*3/uL (ref 1.7–7.7)
Neutrophils Relative %: 56 %
Platelets: 250 10*3/uL (ref 150–400)
RBC: 4.22 MIL/uL (ref 3.87–5.11)
RDW: 12.4 % (ref 11.5–15.5)
WBC: 5.1 10*3/uL (ref 4.0–10.5)
nRBC: 0 % (ref 0.0–0.2)

## 2020-02-24 LAB — VITAMIN D 25 HYDROXY (VIT D DEFICIENCY, FRACTURES): Vit D, 25-Hydroxy: 31.52 ng/mL (ref 30–100)

## 2020-02-24 LAB — VITAMIN B12: Vitamin B-12: 151 pg/mL — ABNORMAL LOW (ref 180–914)

## 2020-02-24 LAB — LACTATE DEHYDROGENASE: LDH: 170 U/L (ref 98–192)

## 2020-02-24 MED ORDER — DENOSUMAB 60 MG/ML ~~LOC~~ SOSY
60.0000 mg | PREFILLED_SYRINGE | Freq: Once | SUBCUTANEOUS | Status: AC
  Administered 2020-02-24: 60 mg via SUBCUTANEOUS
  Filled 2020-02-24: qty 1

## 2020-02-24 NOTE — Progress Notes (Signed)
Patient tolerated injection with no complaints voiced.  Site clean and dry with no bruising or swelling noted at site.  Band aid applied.  VSS with discharge and left in a wheel chair with no s/s of distress noted.  Pt had a filling about a week and half ago.  Per Dr. Delton Coombes, ok to give prolia injection.

## 2020-02-24 NOTE — Assessment & Plan Note (Signed)
1.  Stage Ia right breast cancer: -0.9 cm, ER/PR positive, status post lumpectomy in February 2016, Oncotype DX score 4. -Status post XRT completed on 06/05/2015.  Anastrozole started in June 2016. -BCI testing in September 2018 showed low likelihood of benefit from extended adjuvant therapy. -She is tolerating anastrozole reasonably well. -We reviewed her mammogram from 03/12/2019 which was BI-RADS Category 2. -I have reviewed her CBC and LFTs which are normal.  Creatinine is normal.  Calcium was normal. -We will schedule her for mammogram in March.  We will see her back in 6 months for follow-up.  She is not sure when she started anastrozole. -Hence will continue anastrozole until we see her back in 6 months and then discontinue it.  2.  Osteopenia: -DEXA scan from 06/15/2019 showed T score -1.2 consistent with the osteopenia. -She is receiving Prolia 55-month injections which was started in August 2016.  She will proceed with her Prolia today.  She does not have any side effects.  She will continue calcium and vitamin D.

## 2020-02-24 NOTE — Patient Instructions (Addendum)
[place] at [facility] Discharge Instructions  You were seen today by Dr. Delton Coombes. He went over your recent lab results. Continue taking your cancer pill daily. Continue your Prolia injections. He will see you back in 6 months for labs and follow up.     Thank you for choosing [place] at [facility] to provide your oncology and hematology care.  To afford each patient quality time with our provider, please arrive at least 15 minutes before your scheduled appointment time.   If you have a lab appointment with the [facility] please come in thru the  Main Entrance and check in at the main information desk  You need to re-schedule your appointment should you arrive 10 or more minutes late.  We strive to give you quality time with our providers, and arriving late affects you and other patients whose appointments are after yours.  Also, if you no show three or more times for appointments you may be dismissed from the clinic at the providers discretion.     Again, thank you for choosing [facility].  Our hope is that these requests will decrease the amount of time that you wait before being seen by our physicians.       _____________________________________________________________  Should you have questions after your visit to [facility], please contact our office at [phone] between the hours of 8:00 a.m. and 4:30 p.m.  Voicemails left after 4:00 p.m. will not be returned until the following business day.  For prescription refill requests, have your pharmacy contact our office and allow 72 hours.    Cancer Center Support Programs:   > Cancer Support Group  2nd Tuesday of the month 1pm-2pm, Journey Room

## 2020-02-24 NOTE — Progress Notes (Signed)
[address], [place] [address]   CLINIC:  Medical Oncology/Hematology  PCP:  Asencion Noble, [address] [address] [phone]   REASON FOR VISIT:  Follow-up for right breast cancer.  CURRENT THERAPY: Anastrozole.  BRIEF ONCOLOGIC HISTORY:  Oncology History  Breast cancer ([place])  02/19/2015 Procedure   Breast, right, needle core biopsy, UOQ   02/19/2015 Pathology Results   - INVASIVE DUCTAL CARCINOMA.  ER 100%, PR 100%, Ki-67 18%, HER2 NEGATIVE   03/18/2015 Procedure   Breast, lumpectomy, right breast mass by Dr. Arnoldo Morale    03/18/2015 Pathology Results   - INVASIVE DUCTAL CARCINOMA, NOTTINGHAM COMBINED HISTOLOGIC GRADE II, 0.9 CM. - LOW GRADE DUCTAL CARCINOMA IN SITU. - RESECTION MARGINS, NEGATIVE FOR ATYPIA OR MALIGNANCY.   03/31/2015 Initial Diagnosis   Breast cancer   04/02/2015 Oncotype testing   Recurrence score of 4, low-risk.   04/30/2015 - 06/05/2015 Radiation Therapy   Right breast 50 Gy at 2 Gy/fraction x 25 fractions.  Dr. Pablo Ledger   06/08/2015 Imaging   DEXA-  BMD as determined from Femur Neck Right is 0.843 g/cm2 with a T-Score of -1.4. This patient is considered osteopenic according to [facility]) criteria.   06/10/2015 -  Anti-estrogen oral therapy   Arimidex   09/15/2017 Miscellaneous   BCI: 3.6% risk of late recurrence. Low likelihood of benefit from extended endocrine therapy.      CANCER STAGING: Cancer Staging Breast cancer [facility]) Staging form: Breast, AJCC 7th Edition - Clinical stage from 03/31/2015: Stage IA (T1b, N0, M0) - Signed by Baird Cancer, PA-C on 04/17/2016    INTERVAL HISTORY:  Crystal [name] 79 y.o. female seen for follow-up of right breast cancer.  She is tolerating anastrozole very well.  Appetite is 100%.  Energy levels are 25%.  Pain in the lower back is reported as 8 out of 10.  Numbness in the feet are also stable.  Denies any major hot flashes or  musculoskeletal symptoms.    REVIEW OF SYSTEMS:  Review of Systems  Musculoskeletal: Positive for back pain.  Neurological: Positive for numbness.  Psychiatric/Behavioral: Positive for sleep disturbance.  All other systems reviewed and are negative.    PAST MEDICAL/SURGICAL HISTORY:  Past Medical History:  Diagnosis Date  . Breast cancer ([place]) 03/21/15   right  . Chronic fatigue   . Colitis, ischemic ([place]) 08/2007, 05/2011   Last colonoscopy/bx by Dr Rourk->(08/31/07) descending colon  . Depression   . Diverticulosis   . Enlarged thyroid   . Gait abnormality 10/18/2017  . GERD (gastroesophageal reflux disease)   . HTN (hypertension)   . Lumbar spinal stenosis 11/07/2017   Severe at L4-5, moderate at L3-4  . 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 OA (osteoarthritis)   . Obesity   . PONV (postoperative nausea and vomiting)   . Ptosis of eyelid, left   . Sleep apnea    cpap  . Spinal stenosis   . Tubular adenoma of colon 08/2007   Past Surgical History:  Procedure Laterality Date  . ABDOMINAL HYSTERECTOMY     partial  . BREAST CYST EXCISION     benign, left x2, right x1  . CATARACT EXTRACTION W/PHACO  12/01/2011   Procedure: CATARACT EXTRACTION PHACO AND INTRAOCULAR LENS PLACEMENT (IOC);  Surgeon: Tonny Branch;  Location: [facility] ORS;  Service: Ophthalmology;  Laterality: Right;  CDE=14.87  . CATARACT EXTRACTION W/PHACO  02/06/2012   Procedure: CATARACT EXTRACTION PHACO AND INTRAOCULAR  LENS PLACEMENT (IOC);  Surgeon: Tonny Branch, MD;  Location: [facility] ORS;  Service: Ophthalmology;  Laterality: Left;  CDE 13.00  . COLONOSCOPY  Sept 2008   Last colonoscopy/bx by Dr Rourk->(08/31/07) descending colon TUBULAR ADENOMA  . COLONOSCOPY  10/25/2012   Procedure: COLONOSCOPY;  Surgeon: Daneil Dolin, MD;  Location: [facility] ENDO SUITE;  Service: Endoscopy;  Laterality: N/A;  10:15-changed to 10:30 Darius Bump notified  . PARTIAL MASTECTOMY WITH NEEDLE LOCALIZATION AND AXILLARY SENTINEL LYMPH NODE BX Right 03/18/2015    Procedure: PARTIAL MASTECTOMY AFTER NEEDLE LOCALIZATION AND AXILLARY SENTINEL LYMPH NODE BX;  Surgeon: Aviva Signs Md, MD;  Location: [facility] ORS;  Service: General;  Laterality: Right;  . PTOSIS REPAIR Left 05/11/2017   Procedure: INTERNAL PTOSIS REPAIR OF LEFT EYELID;  Surgeon: Clista Bernhardt, MD;  Location: [facility];  Service: Ophthalmology;  Laterality: Left;  . TUBAL LIGATION  1970   hysterectomy     SOCIAL HISTORY:  Social History   Socioeconomic History  . Marital status: Widowed    Spouse name: Not on file  . Number of children: 3  . Years of education: 26  . Highest education level: Not on file  Occupational History  . Occupation: Lab Tech-retired  Tobacco Use  . Smoking status: Never Smoker  . Smokeless tobacco: Never Used  Substance and Sexual Activity  . Alcohol use: No  . Drug use: No  . Sexual activity: Never    Birth control/protection: Surgical  Other Topics Concern  . Not on file  Social History Narrative   Lives alone   Caffeine use: less than 8 oz per week   Right handed    Social Determinants of Health   Financial Resource Strain:   . Difficulty of Paying Living Expenses: Not on file  Food Insecurity:   . Worried About Charity fundraiser in the Last Year: Not on file  . Ran Out of Food in the Last Year: Not on file  Transportation Needs:   . Lack of Transportation (Medical): Not on file  . Lack of Transportation (Non-Medical): Not on file  Physical Activity:   . Days of Exercise per Week: Not on file  . Minutes of Exercise per Session: Not on file  Stress:   . Feeling of Stress : Not on file  Social Connections:   . Frequency of Communication with Friends and Family: Not on file  . Frequency of Social Gatherings with Friends and Family: Not on file  . Attends Religious Services: Not on file  . Active Member of Clubs or Organizations: Not on file  . Attends Archivist Meetings: Not on file  . Marital Status: Not on file  Intimate  Partner Violence:   . Fear of Current or Ex-Partner: Not on file  . Emotionally Abused: Not on file  . Physically Abused: Not on file  . Sexually Abused: Not on file    FAMILY HISTORY:  Family History  Problem Relation Age of Onset  . Cancer Brother 40  . Coronary artery disease Brother   . Diabetes Sister   . Heart disease Father   . Heart disease Brother   . Anesthesia problems Neg Hx   . Hypotension Neg Hx   . Pseudochol deficiency Neg Hx   . Malignant hyperthermia Neg Hx   . Colon cancer Neg Hx     CURRENT MEDICATIONS:  Outpatient Encounter Medications as of 02/24/2020  Medication Sig  . acetaminophen (TYLENOL ARTHRITIS PAIN) 650 MG CR tablet  Take 1,300 mg by mouth 2 (two) times daily.   Marland Kitchen amLODipine (NORVASC) 10 MG tablet Take 10 mg by mouth daily.  Marland Kitchen anastrozole (ARIMIDEX) 1 MG tablet TAKE 1 TABLET EVERY DAY  . Artificial Tear Solution (SOOTHE XP OP) Place 1 drop into both eyes 2 (two) times daily.  Marland Kitchen buPROPion (WELLBUTRIN SR) 150 MG 12 hr tablet Take 150 mg by mouth 2 (two) times daily.   . Calcium-Vitamin D (CALTRATE 600 PLUS-VIT D PO) Take 1 tablet by mouth daily.  Marland Kitchen FLUoxetine (PROZAC) 20 MG capsule Take 60 mg by mouth daily.   . fluticasone (FLONASE) 50 MCG/ACT nasal spray Place 2 sprays into both nostrils daily.   Marland Kitchen ibuprofen (ADVIL) 200 MG tablet Take 200 mg by mouth 2 (two) times daily.  Marland Kitchen losartan-hydrochlorothiazide (HYZAAR) 100-25 MG per tablet Take 1 tablet by mouth daily.  . pantoprazole (PROTONIX) 40 MG tablet TAKE 1 TABLET BY MOUTH TWICE DAILY (Patient taking differently: TAKE 1 TABLET BY MOUTH DAILY 30 MINUTES BEFORE BREAKFAST)  . simvastatin (ZOCOR) 10 MG tablet Take 10 mg by mouth at bedtime.   . chlorpheniramine (CHLOR-TRIMETON) 4 MG tablet Take 4 mg by mouth as needed. Pt takes daily for 2 weeks when she has a bad cough   . oxyCODONE-acetaminophen (PERCOCET) 5-325 MG tablet Take 2 tablets by mouth every 4 (four) hours as needed. (Patient not taking:  Reported on 02/24/2020)  . [DISCONTINUED] cephALEXin (KEFLEX) 500 MG capsule Take 1 capsule (500 mg total) by mouth 4 (four) times daily.  . [DISCONTINUED] HYDROcodone-acetaminophen (NORCO/VICODIN) 5-325 MG tablet Take 1 tablet by mouth every 6 (six) hours as needed.  . [DISCONTINUED] predniSONE (DELTASONE) 10 MG tablet    No facility-administered encounter medications on file as of 02/24/2020.    ALLERGIES:  Allergies  Allergen Reactions  . Bactrim [Sulfamethoxazole-Trimethoprim] Nausea Only     PHYSICAL EXAM:  ECOG Performance status: 1  Vitals:   02/24/20 1120  BP: (!) 139/57  Pulse: 84  Resp: 16  Temp: (!) 97.3 F (36.3 C)  SpO2: 96%   Filed Weights   02/24/20 1120  Weight: 220 lb (99.8 kg)    Physical Exam Vitals reviewed.  Constitutional:      Appearance: Normal appearance.  Cardiovascular:     Rate and Rhythm: Normal rate and regular rhythm.     Heart sounds: Normal heart sounds.  Pulmonary:     Effort: Pulmonary effort is normal.     Breath sounds: Normal breath sounds.  Abdominal:     General: There is no distension.     Palpations: Abdomen is soft. There is no mass.  Lymphadenopathy:     Cervical: No cervical adenopathy.  Skin:    General: Skin is warm.  Neurological:     General: No focal deficit present.     Mental Status: She is alert and oriented to person, place, and time.  Psychiatric:        Mood and Affect: Mood normal.        Behavior: Behavior normal.    Right breast lumpectomy site is unchanged.  No palpable masses in bilateral wrist.  No palpable axillary adenopathy.  LABORATORY DATA:  I have reviewed the labs as listed.  CBC    Component Value Date/Time   WBC 5.1 02/24/2020 1027   RBC 4.22 02/24/2020 1027   HGB 13.5 02/24/2020 1027   HCT 40.7 02/24/2020 1027   PLT 250 02/24/2020 1027   MCV 96.4 02/24/2020 1027   MCH 32.0  02/24/2020 1027   MCHC 33.2 02/24/2020 1027   RDW 12.4 02/24/2020 1027   LYMPHSABS 1.4 02/24/2020 1027    MONOABS 0.5 02/24/2020 1027   EOSABS 0.2 02/24/2020 1027   BASOSABS 0.0 02/24/2020 1027   CMP Latest Ref Rng & Units 02/24/2020 10/31/2019 08/23/2019  Glucose 70 - 99 mg/dL 101(H) 115(H) 96  BUN 8 - 23 mg/dL 26(H) 24(H) 17  Creatinine 0.44 - 1.00 mg/dL 0.73 0.70 0.76  Sodium 135 - 145 mmol/L 139 138 141  Potassium 3.5 - 5.1 mmol/L 3.9 3.8 4.4  Chloride 98 - 111 mmol/L 103 104 107  CO2 22 - 32 mmol/L _0 Calcium 8.9 - 10.3 mg/dL 9.1 9.3 9.4  Total Protein 6.5 - 8.1 g/dL 7.4 7.3 7.1  Total Bilirubin 0.3 - 1.2 mg/dL 0.6 0.6 0.7  Alkaline Phos 38 - 126 U/L 63 62 57  AST 15 - 41 U/L _1 ALT 0 - 44 U/L _2 DIAGNOSTIC IMAGING:  I have independently reviewed the scans and discussed with the patient.    ASSESSMENT & PLAN:   Breast cancer ([place]) 1.  Stage Ia right breast cancer: -0.9 cm, ER/PR positive, status post lumpectomy in February 2016, Oncotype DX score 4. -Status post XRT completed on 06/05/2015.  Anastrozole started in June 2016. -BCI testing in September 2018 showed low likelihood of benefit from extended adjuvant therapy. -She is tolerating anastrozole reasonably well. -We reviewed her mammogram from 03/12/2019 which was BI-RADS Category 2. -I have reviewed her CBC and LFTs which are normal.  Creatinine is normal.  Calcium was normal. -We will schedule her for mammogram in March.  We will see her back in 6 months for follow-up.  She is not sure when she started anastrozole. -Hence will continue anastrozole until we see her back in 6 months and then discontinue it.  2.  Osteopenia: -DEXA scan from 06/15/2019 showed T score -1.2 consistent with the osteopenia. -She is receiving Prolia 63-monthinjections which was started in August 2016.  She will proceed with her Prolia today.  She does not have any side effects.  She will continue calcium and vitamin D.      Orders placed this encounter:  No orders of the defined types were placed in this  encounter.     SDerek Jack MD [phone]

## 2020-03-17 ENCOUNTER — Ambulatory Visit (HOSPITAL_COMMUNITY)
Admission: RE | Admit: 2020-03-17 | Discharge: 2020-03-17 | Disposition: A | Discharge status: Home / Self Care | Payer: Medicare Other | Source: Ambulatory Visit | Attending: Nurse Practitioner | Admitting: Nurse Practitioner

## 2020-03-17 ENCOUNTER — Other Ambulatory Visit (HOSPITAL_COMMUNITY): Payer: Self-pay | Admitting: Obstetrics and Gynecology

## 2020-03-17 ENCOUNTER — Other Ambulatory Visit: Payer: Self-pay

## 2020-03-17 ENCOUNTER — Encounter (HOSPITAL_COMMUNITY): Payer: Medicare HMO

## 2020-03-17 DIAGNOSIS — R928 Other abnormal and inconclusive findings on diagnostic imaging of breast: Secondary | ICD-10-CM | POA: Diagnosis not present

## 2020-03-17 DIAGNOSIS — C50411 Malignant neoplasm of upper-outer quadrant of right female breast: Secondary | ICD-10-CM

## 2020-03-17 DIAGNOSIS — Z170 Estrogen receptor positive status [ER+]: Secondary | ICD-10-CM

## 2020-03-17 DIAGNOSIS — Z853 Personal history of malignant neoplasm of breast: Secondary | ICD-10-CM | POA: Diagnosis not present

## 2020-03-19 DIAGNOSIS — Z79899 Other long term (current) drug therapy: Secondary | ICD-10-CM | POA: Diagnosis not present

## 2020-03-19 DIAGNOSIS — I10 Essential (primary) hypertension: Secondary | ICD-10-CM | POA: Diagnosis not present

## 2020-03-19 DIAGNOSIS — C50911 Malignant neoplasm of unspecified site of right female breast: Secondary | ICD-10-CM | POA: Diagnosis not present

## 2020-03-19 DIAGNOSIS — G4733 Obstructive sleep apnea (adult) (pediatric): Secondary | ICD-10-CM | POA: Diagnosis not present

## 2020-03-19 LAB — TSH: TSH: 1.31 (ref 0.41–5.90)

## 2020-03-26 DIAGNOSIS — I700 Atherosclerosis of aorta: Secondary | ICD-10-CM | POA: Diagnosis not present

## 2020-03-26 DIAGNOSIS — E785 Hyperlipidemia, unspecified: Secondary | ICD-10-CM | POA: Diagnosis not present

## 2020-03-26 DIAGNOSIS — Z0001 Encounter for general adult medical examination with abnormal findings: Secondary | ICD-10-CM | POA: Diagnosis not present

## 2020-03-26 DIAGNOSIS — I10 Essential (primary) hypertension: Secondary | ICD-10-CM | POA: Diagnosis not present

## 2020-04-16 DIAGNOSIS — G4733 Obstructive sleep apnea (adult) (pediatric): Secondary | ICD-10-CM | POA: Diagnosis not present

## 2020-05-27 ENCOUNTER — Other Ambulatory Visit: Payer: Self-pay

## 2020-05-27 ENCOUNTER — Ambulatory Visit (INDEPENDENT_AMBULATORY_CARE_PROVIDER_SITE_OTHER): Payer: Medicare Other | Admitting: Primary Care

## 2020-05-27 DIAGNOSIS — K219 Gastro-esophageal reflux disease without esophagitis: Secondary | ICD-10-CM | POA: Diagnosis not present

## 2020-05-27 DIAGNOSIS — R05 Cough: Secondary | ICD-10-CM | POA: Diagnosis not present

## 2020-05-27 DIAGNOSIS — R058 Other specified cough: Secondary | ICD-10-CM

## 2020-05-27 MED ORDER — LEVOCETIRIZINE DIHYDROCHLORIDE 5 MG PO TABS: 5.0000 mg | ORAL_TABLET | Freq: Every evening | ORAL | 2 refills | Status: DC

## 2020-05-27 MED ORDER — PREDNISONE 10 MG PO TABS: ORAL_TABLET | ORAL | 0 refills | Status: DC

## 2020-05-27 NOTE — Progress Notes (Signed)
Virtual Visit via Telephone Note  I connected with Crystal [name] on 05/27/20 at 10:00 AM EDT by telephone and verified that I am speaking with the correct person using two identifiers.  Location: Patient: Home Provider: Office   I discussed the limitations, risks, security and privacy concerns of performing an evaluation and management service by telephone and the availability of in person appointments. I also discussed with the patient that there may be a patient responsible charge related to this service. The patient expressed understanding and agreed to proceed.   History of Present Illness: 79 year old female, never smoked.  Past medical history significant for upper airway cough, GERD, OSA.  Patient of Dr. Vaughan Browner, last seen 10/23/2018.  Symptoms previously improved with steroid nasal spray, antihistamine and Protonix.  She was initially on Protonix 40 mg twice daily which was reduced to once daily.  Unclear if cough began when Cozaar started.  Suspicion for asthma is low. FENO normal. She has no history of allergies or exposures. She is maintained on CPAP for obstructive sleep apnea.    05/27/2020 Patient contacted today for televisit for UACS/GERD follow-up. States that her cough is pretty bad. Cough is dry. States that she gets out of breath with coughing. She is taking protonix once a day in the morning. Using flonase nasal spry at night. She is taking Chlorpheniramine 2 tabs three times a day. States that it helped for awhile. She has not been wearing CPAP the last month d/t cough. She has nasal congestion. She is sleeping in chair and reports sleeping well. She does not experience any overt GERD symptoms but has some intermittent nausea. Denies chest tightness, wheezing.    Observations/Objective:  - Able to speak in full sentences; no overt shortness of breath or wheezing   - Eosinophils 200 in March 2021   Data Reviewed: Imaging CXR 04/28/16 Stable linear scarring of the lingula  compared to 2016. I have reviewed her images personally.  PFTs 04/25/16 FVC 3 [105%), FEV1 2.5 to [180%), F/F 84, TLC 190%, DLCO 81%. No obstruction, restriction. Minimal diffusion defect.  FENO 05/17/17- 5  Labs CBC 08/20/2018-WBC 5.3, eos 4%, absolute eosinophil count 212  Sleep  PSG 12/19/15 Moderate sleep apnea   Assessment and Plan:  UACS: - Triggered by PND and GERD symtoms - Recommend patient use over the counter [place] nasal spray twice a day for nasal congestion  - Adding Xyzal 5mg  at bedtime, advised she decrease Chlorpheniramine to 1 tablet three times a day prn cough  - Rx: Prednisone 20mg  x 5 days for acute exacerbation   GERD: - Increased Protonix back to twice daily until follow-up  Follow Up Instructions:  2-4 weeks televisit with Eustaquio Maize NP    I discussed the assessment and treatment plan with the patient. The patient was provided an opportunity to ask questions and all were answered. The patient agreed with the plan and demonstrated an understanding of the instructions.   The patient was advised to call back or seek an in-person evaluation if the symptoms worsen or if the condition fails to improve as anticipated.  I provided 25 minutes of non-face-to-face time during this encounter.   Martyn Ehrich, NP

## 2020-05-27 NOTE — Patient Instructions (Addendum)
Recommendations: Use over the counter [place] nasal spray twice a day for nasal congestion  Decrease Chlorpheniramine to 1 tablet three times a day  Take protonix twice daily   Rx: Prednisone 20mg  x 5 days Xyzal at bedtime  Follow-up: 2-4 weeks televisit with Eustaquio Maize NP

## 2020-06-11 ENCOUNTER — Other Ambulatory Visit (HOSPITAL_COMMUNITY): Payer: Self-pay | Admitting: *Deleted

## 2020-06-11 DIAGNOSIS — C50411 Malignant neoplasm of upper-outer quadrant of right female breast: Secondary | ICD-10-CM

## 2020-06-11 MED ORDER — ANASTROZOLE 1 MG PO TABS: 1.0000 mg | ORAL_TABLET | Freq: Every day | ORAL | 0 refills | Status: DC

## 2020-06-24 ENCOUNTER — Telehealth: Payer: Self-pay | Admitting: Primary Care

## 2020-06-24 MED ORDER — PREDNISONE 20 MG PO TABS: 20.0000 mg | ORAL_TABLET | Freq: Every day | ORAL | 0 refills | Status: DC

## 2020-06-24 NOTE — Telephone Encounter (Signed)
Spoke with daughter, sharon, listed on DPR.  Daughter states that she took the prednisone and was better with the cough, after 5 days the cough is back.  It is a dry cough, she has soreness in her chest from coughing and is not sleeping well at night. She got choked yesterday while coughing.  She has not tried anything over the counter for the cough.  She is taking her Protonix and her allergy pill.  She has a follow up on 7/13, but is back to where she was prior to the prednisone.  Dr. Vaughan Browner, please advise.  Thank you.

## 2020-06-24 NOTE — Telephone Encounter (Signed)
Called pt's daughter and advised message from the provider. She understood and verbalized understanding. Nothing further is needed. Rx sent in.

## 2020-06-24 NOTE — Telephone Encounter (Signed)
Repeat prednisone 20mg  x 5 days for acute exacerbation.  Would like to avoid further steroid use until she can be seen back in clinic.

## 2020-06-24 NOTE — Telephone Encounter (Signed)
Pt daughter called back to see if prednisone can be called in. States that her coughing is increasing and painful. Can be reached at [phone]  Pharm:[facility] [address] in [place]

## 2020-06-25 NOTE — Telephone Encounter (Signed)
Yes we can refill her prednisone, please send in prescription for prednisone 10mg  daily #20

## 2020-06-25 NOTE — Telephone Encounter (Signed)
Medication was filled yesterday per Dr. Vaughan Browner. See encounter.

## 2020-07-07 ENCOUNTER — Ambulatory Visit: Payer: Medicare Other | Admitting: Pulmonary Disease

## 2020-07-07 ENCOUNTER — Encounter: Payer: Self-pay | Admitting: Pulmonary Disease

## 2020-07-07 ENCOUNTER — Ambulatory Visit (INDEPENDENT_AMBULATORY_CARE_PROVIDER_SITE_OTHER): Payer: Medicare Other

## 2020-07-07 ENCOUNTER — Other Ambulatory Visit: Payer: Self-pay

## 2020-07-07 VITALS — BP 130/72 | HR 98 | Temp 98.5°F | Ht 63.0 in | Wt 235.8 lb

## 2020-07-07 DIAGNOSIS — R05 Cough: Secondary | ICD-10-CM

## 2020-07-07 DIAGNOSIS — R053 Chronic cough: Secondary | ICD-10-CM

## 2020-07-07 DIAGNOSIS — G4733 Obstructive sleep apnea (adult) (pediatric): Secondary | ICD-10-CM

## 2020-07-07 LAB — CBC WITH DIFFERENTIAL/PLATELET
Basophils Absolute: 0 10*3/uL (ref 0.0–0.1)
Basophils Relative: 0.7 % (ref 0.0–3.0)
Eosinophils Absolute: 0.2 10*3/uL (ref 0.0–0.7)
Eosinophils Relative: 3.7 % (ref 0.0–5.0)
HCT: 38.6 % (ref 36.0–46.0)
Hemoglobin: 12.9 g/dL (ref 12.0–15.0)
Lymphocytes Relative: 30.3 % (ref 12.0–46.0)
Lymphs Abs: 2 10*3/uL (ref 0.7–4.0)
MCHC: 33.6 g/dL (ref 30.0–36.0)
MCV: 95.8 fl (ref 78.0–100.0)
Monocytes Absolute: 0.8 10*3/uL (ref 0.1–1.0)
Monocytes Relative: 12.1 % — ABNORMAL HIGH (ref 3.0–12.0)
Neutro Abs: 3.4 10*3/uL (ref 1.4–7.7)
Neutrophils Relative %: 53.2 % (ref 43.0–77.0)
Platelets: 248 10*3/uL (ref 150.0–400.0)
RBC: 4.02 Mil/uL (ref 3.87–5.11)
RDW: 13.8 % (ref 11.5–15.5)
WBC: 6.5 10*3/uL (ref 4.0–10.5)

## 2020-07-07 MED ORDER — BUDESONIDE-FORMOTEROL FUMARATE 160-4.5 MCG/ACT IN AERO: 2.0000 | INHALATION_SPRAY | Freq: Two times a day (BID) | RESPIRATORY_TRACT | 3 refills | Status: DC

## 2020-07-07 NOTE — Patient Instructions (Signed)
We will get a chest x-ray today, CBC with differential, IgE Start you on an inhaler called Symbicort 160 Continue with the steroid nasal spray and chlorpheniramine  We will send an order to DME company for mask fitting and mask changes Resume using the CPAP Follow-up in 3 months.

## 2020-07-07 NOTE — Progress Notes (Signed)
TOMA ERICHSEN    [id number]    [birth year]  Primary Care Physician:Fagan, Carloyn Manner, MD  Referring Physician: Asencion Noble, MD [address] [place],  [place] [address]  Chief complaint:   Follow up for  Chronic cough Post nasal drip OSA on CPAP 5-20  HPI: [name] is a 79 year old with upper airway cough, GERD, OSA  Symptoms improved with steroid nasal spray, antihistamine and Protonix.  She was initially on Protonix 40 mg twice daily which is subsequently reduced to once daily. She is on Cozaar for her blood pressure medication. But she is unable to state if her symptoms of cough began when Cozaar was started.   She has history of OSA on CPAP. No history of allergies or exposures. She is a never smoker and does not drink alcohol.   Interim history: States that cough is slightly worse now, nonproductive in nature.  Also complains of wheezing, increasing dyspnea. Not using the CPAP as she has nasal congestion and feels that the facemask is too suffocating.   Outpatient Encounter Medications as of 07/07/2020  Medication Sig   acetaminophen (TYLENOL ARTHRITIS PAIN) 650 MG CR tablet Take 1,300 mg by mouth 2 (two) times daily.    amLODipine (NORVASC) 10 MG tablet Take 10 mg by mouth daily.   anastrozole (ARIMIDEX) 1 MG tablet Take 1 tablet (1 mg total) by mouth daily.   Artificial Tear Solution (SOOTHE XP OP) Place 1 drop into both eyes 2 (two) times daily.   buPROPion (WELLBUTRIN SR) 150 MG 12 hr tablet Take 150 mg by mouth 2 (two) times daily.    Calcium-Vitamin D (CALTRATE 600 PLUS-VIT D PO) Take 1 tablet by mouth daily.   chlorpheniramine (CHLOR-TRIMETON) 4 MG tablet Take 4 mg by mouth as needed. Pt takes daily for 2 weeks when she has a bad cough    FLUoxetine (PROZAC) 20 MG capsule Take 60 mg by mouth daily.    fluticasone (FLONASE) 50 MCG/ACT nasal spray Place 2 sprays into both nostrils daily.    ibuprofen (ADVIL) 200 MG tablet Take 200 mg by mouth 2  (two) times daily.   levocetirizine (XYZAL) 5 MG tablet Take 1 tablet (5 mg total) by mouth every evening.   losartan-hydrochlorothiazide (HYZAAR) 100-25 MG per tablet Take 1 tablet by mouth daily.   pantoprazole (PROTONIX) 40 MG tablet TAKE 1 TABLET BY MOUTH TWICE DAILY (Patient taking differently: 40 mg daily. )   simvastatin (ZOCOR) 10 MG tablet Take 10 mg by mouth at bedtime.    [DISCONTINUED] oxyCODONE-acetaminophen (PERCOCET) 5-325 MG tablet Take 2 tablets by mouth every 4 (four) hours as needed. (Patient not taking: Reported on 02/24/2020)   [DISCONTINUED] predniSONE (DELTASONE) 10 MG tablet Take 2 tabs x 5 days   [DISCONTINUED] predniSONE (DELTASONE) 20 MG tablet Take 1 tablet (20 mg total) by mouth daily with breakfast.   No facility-administered encounter medications on file as of 07/07/2020.   Physical Exam: Blood pressure 130/72, pulse 98, temperature 98.5 F (36.9 C), temperature source Oral, height 5\' 3"  (1.6 m), weight 235 lb 12.8 oz (107 kg), SpO2 96 %. Gen:      No acute distress HEENT:  EOMI, sclera anicteric Neck:     No masses; no thyromegaly Lungs:    Clear to auscultation bilaterally; normal respiratory effort CV:         Regular rate and rhythm; no murmurs Abd:      + bowel sounds; soft, non-tender; no  palpable masses, no distension Ext:    No edema; adequate peripheral perfusion Skin:      Warm and dry; no rash Neuro: alert and oriented x 3 Psych: normal mood and affect  Data Reviewed: Imaging CXR 04/28/16 Stable linear scarring of the lingula compared to 2016. I have reviewed her images personally.   PFTs 04/25/16 FVC 3 [105%), FEV1 2.5 to [180%), F/F 84, TLC 190%, DLCO 81%. No obstruction, restriction. Minimal diffusion defect.  FENO 05/17/17- 5  Labs CBC 08/20/2018-WBC 5.3, eos 4%, absolute eosinophil count 212  Sleep  PSG 12/19/15 Moderate sleep apnea  CPAP download 09/21/2018- 10/20/2018 87% compliant Set pressure 5-20 Average pressure  delivered 11.6, residual AHI 0.7  Assessment:  Upper airway cough syndrome Cough secondary to allergic rhinitis, postnasal drip with contribution of GERD. Suspicion for asthma was low and FENO is normal  Now with worsening dyspnea, chest congestion and wheezing we will reevaluate with chest x-ray Check CBC differential and IgE Start Symbicort Continue steroid nasal spray and chlorpheniramine Continue Protonix for GERD   OSA. Noncompliant with CPAP due to issues with mask DME order placed for mask fitting  Plan/Recommendations: Symbicort CBC with differential, IgE Chest x-ray Mask fitting for CPAP  Marshell Garfinkel MD [facility] 07/07/2020, 2:50 PM  CC: Asencion Noble, MD

## 2020-07-08 ENCOUNTER — Telehealth: Payer: Self-pay

## 2020-07-08 ENCOUNTER — Encounter: Payer: Self-pay | Admitting: Pulmonary Disease

## 2020-07-08 DIAGNOSIS — R911 Solitary pulmonary nodule: Secondary | ICD-10-CM

## 2020-07-08 LAB — IGE: IgE (Immunoglobulin E), Serum: <2 kU/L (ref <=114)

## 2020-07-08 NOTE — Telephone Encounter (Signed)
Call report for Dr. Vaughan Browner. Impression 2    IMPRESSION: 1. No acute cardiopulmonary abnormalities. 2. New, indeterminate nodular density within the left midlung. Recommend further evaluation with CT of the chest. 3. These results will be called to the ordering clinician or representative by the Radiologist Assistant, and communication documented in the PACS or [employer].

## 2020-07-08 NOTE — Telephone Encounter (Signed)
Please let patient know that chest x-ray shows a spot in the lung Order super D CT chest without contrast for further evaluation

## 2020-07-08 NOTE — Telephone Encounter (Signed)
Contacted patient with results of CT, ordered super D CT without contrast. Patient verbalized understanding of results and plan of care.

## 2020-07-20 ENCOUNTER — Other Ambulatory Visit: Payer: Self-pay

## 2020-07-20 ENCOUNTER — Ambulatory Visit (HOSPITAL_COMMUNITY)
Admission: RE | Admit: 2020-07-20 | Discharge: 2020-07-20 | Disposition: A | Discharge status: Home / Self Care | Payer: Medicare Other | Source: Ambulatory Visit | Attending: Pulmonary Disease | Admitting: Pulmonary Disease

## 2020-07-20 DIAGNOSIS — R911 Solitary pulmonary nodule: Secondary | ICD-10-CM | POA: Diagnosis not present

## 2020-07-24 ENCOUNTER — Ambulatory Visit (HOSPITAL_COMMUNITY): Payer: Medicare Other

## 2020-07-25 DIAGNOSIS — G4733 Obstructive sleep apnea (adult) (pediatric): Secondary | ICD-10-CM | POA: Diagnosis not present

## 2020-07-28 DIAGNOSIS — J399 Disease of upper respiratory tract, unspecified: Secondary | ICD-10-CM | POA: Diagnosis not present

## 2020-07-28 DIAGNOSIS — I10 Essential (primary) hypertension: Secondary | ICD-10-CM | POA: Diagnosis not present

## 2020-07-28 DIAGNOSIS — R05 Cough: Secondary | ICD-10-CM | POA: Diagnosis not present

## 2020-07-28 DIAGNOSIS — G4733 Obstructive sleep apnea (adult) (pediatric): Secondary | ICD-10-CM | POA: Diagnosis not present

## 2020-08-12 MED ORDER — LEVOCETIRIZINE DIHYDROCHLORIDE 5 MG PO TABS: 5.0000 mg | ORAL_TABLET | Freq: Every evening | ORAL | 2 refills | Status: DC

## 2020-08-26 ENCOUNTER — Other Ambulatory Visit (HOSPITAL_COMMUNITY): Payer: Self-pay

## 2020-08-26 DIAGNOSIS — Z780 Asymptomatic menopausal state: Secondary | ICD-10-CM

## 2020-08-26 DIAGNOSIS — M8580 Other specified disorders of bone density and structure, unspecified site: Secondary | ICD-10-CM

## 2020-08-26 DIAGNOSIS — C50411 Malignant neoplasm of upper-outer quadrant of right female breast: Secondary | ICD-10-CM

## 2020-08-27 ENCOUNTER — Inpatient Hospital Stay (HOSPITAL_COMMUNITY): Payer: Medicare Other

## 2020-08-27 ENCOUNTER — Inpatient Hospital Stay (HOSPITAL_COMMUNITY): Payer: Medicare Other | Admitting: Hematology

## 2020-08-27 ENCOUNTER — Telehealth: Payer: Self-pay | Admitting: *Deleted

## 2020-08-27 ENCOUNTER — Other Ambulatory Visit: Payer: Self-pay

## 2020-08-27 ENCOUNTER — Inpatient Hospital Stay (HOSPITAL_COMMUNITY): Payer: Medicare Other | Attending: Hematology

## 2020-08-27 VITALS — BP 175/83 | HR 80 | Temp 97.2°F | Resp 20 | Wt 228.7 lb

## 2020-08-27 DIAGNOSIS — C50411 Malignant neoplasm of upper-outer quadrant of right female breast: Secondary | ICD-10-CM | POA: Insufficient documentation

## 2020-08-27 DIAGNOSIS — Z170 Estrogen receptor positive status [ER+]: Secondary | ICD-10-CM | POA: Diagnosis not present

## 2020-08-27 DIAGNOSIS — M8580 Other specified disorders of bone density and structure, unspecified site: Secondary | ICD-10-CM | POA: Insufficient documentation

## 2020-08-27 DIAGNOSIS — Z79811 Long term (current) use of aromatase inhibitors: Secondary | ICD-10-CM | POA: Diagnosis not present

## 2020-08-27 DIAGNOSIS — R5383 Other fatigue: Secondary | ICD-10-CM | POA: Insufficient documentation

## 2020-08-27 DIAGNOSIS — Z7951 Long term (current) use of inhaled steroids: Secondary | ICD-10-CM | POA: Diagnosis not present

## 2020-08-27 DIAGNOSIS — Z791 Long term (current) use of non-steroidal anti-inflammatories (NSAID): Secondary | ICD-10-CM | POA: Insufficient documentation

## 2020-08-27 DIAGNOSIS — Z79899 Other long term (current) drug therapy: Secondary | ICD-10-CM | POA: Insufficient documentation

## 2020-08-27 DIAGNOSIS — Z923 Personal history of irradiation: Secondary | ICD-10-CM | POA: Insufficient documentation

## 2020-08-27 DIAGNOSIS — Z780 Asymptomatic menopausal state: Secondary | ICD-10-CM

## 2020-08-27 LAB — CBC WITH DIFFERENTIAL/PLATELET
Abs Immature Granulocytes: 0.01 10*3/uL (ref 0.00–0.07)
Basophils Absolute: 0 10*3/uL (ref 0.0–0.1)
Basophils Relative: 1 %
Eosinophils Absolute: 0.3 10*3/uL (ref 0.0–0.5)
Eosinophils Relative: 5 %
HCT: 40.8 % (ref 36.0–46.0)
Hemoglobin: 13.2 g/dL (ref 12.0–15.0)
Immature Granulocytes: 0 %
Lymphocytes Relative: 30 %
Lymphs Abs: 1.7 10*3/uL (ref 0.7–4.0)
MCH: 32.4 pg (ref 26.0–34.0)
MCHC: 32.4 g/dL (ref 30.0–36.0)
MCV: 100.2 fL — ABNORMAL HIGH (ref 80.0–100.0)
Monocytes Absolute: 0.6 10*3/uL (ref 0.1–1.0)
Monocytes Relative: 11 %
Neutro Abs: 3.1 10*3/uL (ref 1.7–7.7)
Neutrophils Relative %: 53 %
Platelets: 265 10*3/uL (ref 150–400)
RBC: 4.07 MIL/uL (ref 3.87–5.11)
RDW: 13.2 % (ref 11.5–15.5)
WBC: 5.7 10*3/uL (ref 4.0–10.5)
nRBC: 0 % (ref 0.0–0.2)

## 2020-08-27 LAB — COMPREHENSIVE METABOLIC PANEL
ALT: 22 U/L (ref 0–44)
AST: 24 U/L (ref 15–41)
Albumin: 4.4 g/dL (ref 3.5–5.0)
Alkaline Phosphatase: 54 U/L (ref 38–126)
Anion gap: 12 (ref 5–15)
BUN: 23 mg/dL (ref 8–23)
CO2: 24 mmol/L (ref 22–32)
Calcium: 9.5 mg/dL (ref 8.9–10.3)
Chloride: 103 mmol/L (ref 98–111)
Creatinine, Ser: 0.76 mg/dL (ref 0.44–1.00)
GFR calc Af Amer: >60 mL/min (ref >60)
GFR calc non Af Amer: >60 mL/min (ref >60)
Glucose, Bld: 76 mg/dL (ref 70–99)
Potassium: 4.1 mmol/L (ref 3.5–5.1)
Sodium: 139 mmol/L (ref 135–145)
Total Bilirubin: 0.9 mg/dL (ref 0.3–1.2)
Total Protein: 7.4 g/dL (ref 6.5–8.1)

## 2020-08-27 MED ORDER — DENOSUMAB 60 MG/ML ~~LOC~~ SOSY
60.0000 mg | PREFILLED_SYRINGE | Freq: Once | SUBCUTANEOUS | Status: AC
  Administered 2020-08-27: 60 mg via SUBCUTANEOUS

## 2020-08-27 MED ORDER — DENOSUMAB 60 MG/ML ~~LOC~~ SOSY
PREFILLED_SYRINGE | SUBCUTANEOUS | Status: AC
  Filled 2020-08-27: qty 1

## 2020-08-27 NOTE — Progress Notes (Signed)
Patient was assessed by Dr. Delton Coombes and labs have been reviewed.  Patient is okay to proceed with treatment today.  She will also stop her anastrozole as she has been on it for 5 years.  Primary RN and pharmacy aware.

## 2020-08-27 NOTE — Progress Notes (Signed)
Crystal [name] presents today for denosumab injection. Lab work reviewed prior to administration. VSS. Pt reports taking Ca and Vit D as instructed. Pt denies tooth/jaw pain and denies recent or future invasive dental work. Injection tolerated well, see MAR for details. Site clean and dry, band aid applied. Pt discharged in satisfactory condition with follow up instructions.

## 2020-08-27 NOTE — Telephone Encounter (Signed)
Received request from [employer] [facility] for refill of symbicort AER, attempted to refill, reached a message that is it not on the preferred list.  Alternatives:  Smbicort 80-4.5 Advair HFA 45-21, 115-21, 230-21 Breo Ellipta 100-25, 200-25 Dulera 100-5, 200-5, 50-5 Fluticasone-Salmeterol 100-50, 250-50, 500-50, 55-14, 113-14, 232-14  Dr. Vaughan Browner,  Please advise.  Thank you.

## 2020-08-27 NOTE — Telephone Encounter (Signed)
Try Smbicort 80-4.5

## 2020-08-27 NOTE — Patient Instructions (Signed)
[facility]  Discharge Instructions:  Denosumab injection What is this medicine? DENOSUMAB (den oh sue mab) slows bone breakdown. Prolia is used to treat osteoporosis in women after menopause and in men, and in people who are taking corticosteroids for 6 months or more. Xgeva is used to treat a high calcium level due to cancer and to prevent bone fractures and other bone problems caused by multiple myeloma or cancer bone metastases. Xgeva is also used to treat giant cell tumor of the bone. This medicine may be used for other purposes; ask your health care provider or pharmacist if you have questions. COMMON BRAND NAME(S): Prolia, XGEVA What should I tell my health care provider before I take this medicine? They need to know if you have any of these conditions:  dental disease  having surgery or tooth extraction  infection  kidney disease  low levels of calcium or Vitamin D in the blood  malnutrition  on hemodialysis  skin conditions or sensitivity  thyroid or parathyroid disease  an unusual reaction to denosumab, other medicines, foods, dyes, or preservatives  pregnant or trying to get pregnant  breast-feeding How should I use this medicine? This medicine is for injection under the skin. It is given by a health care professional in a hospital or clinic setting. A special MedGuide will be given to you before each treatment. Be sure to read this information carefully each time. For Prolia, talk to your pediatrician regarding the use of this medicine in children. Special care may be needed. For Xgeva, talk to your pediatrician regarding the use of this medicine in children. While this drug may be prescribed for children as young as 13 years for selected conditions, precautions do apply. Overdosage: If you think you have taken too much of this medicine contact a poison control center or emergency room at once. NOTE: This medicine is only for  you. Do not share this medicine with others. What if I miss a dose? It is important not to miss your dose. Call your doctor or health care professional if you are unable to keep an appointment. What may interact with this medicine? Do not take this medicine with any of the following medications:  other medicines containing denosumab This medicine may also interact with the following medications:  medicines that lower your chance of fighting infection  steroid medicines like prednisone or cortisone This list may not describe all possible interactions. Give your health care provider a list of all the medicines, herbs, non-prescription drugs, or dietary supplements you use. Also tell them if you smoke, drink alcohol, or use illegal drugs. Some items may interact with your medicine. What should I watch for while using this medicine? Visit your doctor or health care professional for regular checks on your progress. Your doctor or health care professional may order blood tests and other tests to see how you are doing. Call your doctor or health care professional for advice if you get a fever, chills or sore throat, or other symptoms of a cold or flu. Do not treat yourself. This drug may decrease your body's ability to fight infection. Try to avoid being around people who are sick. You should make sure you get enough calcium and vitamin D while you are taking this medicine, unless your doctor tells you not to. Discuss the foods you eat and the vitamins you take with your health care professional. See your dentist regularly. Brush and floss your teeth as directed.   Before you have any dental work done, tell your dentist you are receiving this medicine. Do not become pregnant while taking this medicine or for 5 months after stopping it. Talk with your doctor or health care professional about your birth control options while taking this medicine. Women should inform their doctor if they wish to become  pregnant or think they might be pregnant. There is a potential for serious side effects to an unborn child. Talk to your health care professional or pharmacist for more information. What side effects may I notice from receiving this medicine? Side effects that you should report to your doctor or health care professional as soon as possible:  allergic reactions like skin rash, itching or hives, swelling of the face, lips, or tongue  bone pain  breathing problems  dizziness  jaw pain, especially after dental work  redness, blistering, peeling of the skin  signs and symptoms of infection like fever or chills; cough; sore throat; pain or trouble passing urine  signs of low calcium like fast heartbeat, muscle cramps or muscle pain; pain, tingling, numbness in the hands or feet; seizures  unusual bleeding or bruising  unusually weak or tired Side effects that usually do not require medical attention (report to your doctor or health care professional if they continue or are bothersome):  constipation  diarrhea  headache  joint pain  loss of appetite  muscle pain  runny nose  tiredness  upset stomach This list may not describe all possible side effects. Call your doctor for medical advice about side effects. You may report side effects to FDA at 1-800-FDA-1088. Where should I keep my medicine? This medicine is only given in a clinic, doctor's office, or other health care setting and will not be stored at home. NOTE: This sheet is a summary. It may not cover all possible information. If you have questions about this medicine, talk to your doctor, pharmacist, or health care provider.  2020 Elsevier/Gold Standard (2018-04-20 16:10:44)  _______________________________________________________________  Thank you for choosing [facility] at [facility] to provide your oncology and hematology care.  To afford each patient quality time with our providers,  please arrive at least 15 minutes before your scheduled appointment.  You need to re-schedule your appointment if you arrive 10 or more minutes late.  We strive to give you quality time with our providers, and arriving late affects you and other patients whose appointments are after yours.  Also, if you no show three or more times for appointments you may be dismissed from the clinic.  Again, thank you for choosing  Can[facility] at [facility]. Our hope is that these requests will allow you access to exceptional care and in a timely manner. _______________________________________________________________  If you have questions after your visit, please contact our office at [phone] between the hours of 8:30 a.m. and 5:00 p.m. Voicemails left after 4:30 p.m. will not be returned until the following business day. _______________________________________________________________  For prescription refill requests, have your pharmacy contact our office. _______________________________________________________________  Recommendations made by the consultant and any test results will be sent to your referring physician. _______________________________________________________________ 

## 2020-08-27 NOTE — Progress Notes (Signed)
[facility] [address] [place] [address]   Patient Care Team: Asencion Noble, MD as PCP - General (Internal Medicine) Gala Romney Cristopher Estimable, MD (Gastroenterology)  SUMMARY OF ONCOLOGIC HISTORY: Oncology History  Breast cancer Crystal [name])  02/19/2015 Procedure   Breast, right, needle core biopsy, UOQ   02/19/2015 Pathology Results   - INVASIVE DUCTAL CARCINOMA.  ER 100%, PR 100%, Ki-67 18%, HER2 NEGATIVE   03/18/2015 Procedure   Breast, lumpectomy, right breast mass by Dr. Arnoldo Morale    03/18/2015 Pathology Results   - INVASIVE DUCTAL CARCINOMA, NOTTINGHAM COMBINED HISTOLOGIC GRADE II, 0.9 CM. - LOW GRADE DUCTAL CARCINOMA IN SITU. - RESECTION MARGINS, NEGATIVE FOR ATYPIA OR MALIGNANCY.   03/31/2015 Initial Diagnosis   Breast cancer   04/02/2015 Oncotype testing   Recurrence score of 4, low-risk.   04/30/2015 - 06/05/2015 Radiation Therapy   Right breast 50 Gy at 2 Gy/fraction x 25 fractions.  Dr. Pablo Ledger   06/08/2015 Imaging   DEXA-  BMD as determined from Femur Neck Right is 0.843 g/cm2 with a T-Score of -1.4. This patient is considered osteopenic according to [facility]) criteria.   06/10/2015 -  Anti-estrogen oral therapy   Arimidex   09/15/2017 Miscellaneous   BCI: 3.6% risk of late recurrence. Low likelihood of benefit from extended endocrine therapy.     CHIEF COMPLIANT: Follow up for right breast cancer   INTERVAL HISTORY: Crystal [name] is a 79 y.o. female here today for follow up of her right breast cancer. Her last visit was on 02/24/2020.   Today she is accompanied by her daughter. She reports feeling well. She is tolerating the Arimidex well and denies having any jaw pain. She denies having any chest pain.   REVIEW OF SYSTEMS:   Review of Systems  Constitutional: Positive for fatigue (severe). Negative for appetite change.  Gastrointestinal: Positive for constipation.  Neurological: Positive for numbness (legs).  All other systems  reviewed and are negative.   I have reviewed the past medical history, past surgical history, social history and family history with the patient and they are unchanged from previous note.   ALLERGIES:   is allergic to bactrim [sulfamethoxazole-trimethoprim].   MEDICATIONS:  Current Outpatient Medications  Medication Sig Dispense Refill  . amLODipine (NORVASC) 10 MG tablet Take 10 mg by mouth daily.    Marland Kitchen anastrozole (ARIMIDEX) 1 MG tablet Take 1 tablet (1 mg total) by mouth daily. 90 tablet 0  . Artificial Tear Solution (SOOTHE XP OP) Place 1 drop into both eyes 2 (two) times daily.    . budesonide-formoterol (SYMBICORT) 160-4.5 MCG/ACT inhaler Inhale 2 puffs into the lungs in the morning and at bedtime. 10.2 g 3  . buPROPion (WELLBUTRIN SR) 150 MG 12 hr tablet Take 150 mg by mouth 2 (two) times daily.     . Calcium-Vitamin D (CALTRATE 600 PLUS-VIT D PO) Take 1 tablet by mouth daily.    . chlorpheniramine (CHLOR-TRIMETON) 4 MG tablet Take 4 mg by mouth as needed. Pt takes daily for 2 weeks when she has a bad cough     . FLUoxetine (PROZAC) 20 MG capsule Take 60 mg by mouth daily.     . fluticasone (FLONASE) 50 MCG/ACT nasal spray Place 2 sprays into both nostrils daily.     Marland Kitchen ibuprofen (ADVIL) 200 MG tablet Take 200 mg by mouth 2 (two) times daily.    Marland Kitchen levocetirizine (XYZAL) 5 MG tablet Take 1 tablet (5 mg total)  by mouth every evening. 30 tablet 2  . losartan-hydrochlorothiazide (HYZAAR) 100-25 MG per tablet Take 1 tablet by mouth daily.    . pantoprazole (PROTONIX) 40 MG tablet TAKE 1 TABLET BY MOUTH TWICE DAILY (Patient taking differently: 40 mg daily. ) 60 tablet 5  . simvastatin (ZOCOR) 10 MG tablet Take 10 mg by mouth at bedtime.     Marland Kitchen acetaminophen (TYLENOL ARTHRITIS PAIN) 650 MG CR tablet Take 1,300 mg by mouth 2 (two) times daily.  (Patient not taking: Reported on 08/27/2020)     No current facility-administered medications for this visit.     PHYSICAL  EXAMINATION: Performance status (ECOG): 1 - Symptomatic but completely ambulatory  Vitals:   08/27/20 1129  BP: (!) 175/83  Pulse: 80  Resp: 20  Temp: (!) 97.2 F (36.2 C)  SpO2: 94%   Wt Readings from Last 3 Encounters:  08/27/20 228 lb 11.2 oz (103.7 kg)  07/07/20 235 lb 12.8 oz (107 kg)  02/24/20 220 lb (99.8 kg)   Physical Exam Vitals reviewed.  Constitutional:      Appearance: Normal appearance. She is obese.  Cardiovascular:     Rate and Rhythm: Normal rate and regular rhythm.     Pulses: Normal pulses.     Heart sounds: Normal heart sounds.  Pulmonary:     Effort: Pulmonary effort is normal.     Breath sounds: Normal breath sounds.  Chest:     Breasts:        Right: No mass.      Comments: Right lumpectomy incision healed well Neurological:     General: No focal deficit present.     Mental Status: She is alert and oriented to person, place, and time.  Psychiatric:        Mood and Affect: Mood normal.        Behavior: Behavior normal.     Breast Exam Chaperone: Milinda Antis, MD     LABORATORY DATA:  I have reviewed the data as listed CMP Latest Ref Rng & Units 08/27/2020 02/24/2020 10/31/2019  Glucose 70 - 99 mg/dL 76 101(H) 115(H)  BUN 8 - 23 mg/dL 23 26(H) 24(H)  Creatinine 0.44 - 1.00 mg/dL 0.76 0.73 0.70  Sodium 135 - 145 mmol/L 139 139 138  Potassium 3.5 - 5.1 mmol/L 4.1 3.9 3.8  Chloride 98 - 111 mmol/L 103 103 104  CO2 22 - 32 mmol/L _0 Calcium 8.9 - 10.3 mg/dL 9.5 9.1 9.3  Total Protein 6.5 - 8.1 g/dL 7.4 7.4 7.3  Total Bilirubin 0.3 - 1.2 mg/dL 0.9 0.6 0.6  Alkaline Phos 38 - 126 U/L 54 63 62  AST 15 - 41 U/L _1 ALT 0 - 44 U/L _2 No results found for: [id number] Lab Results  Component Value Date   WBC 5.7 08/27/2020   HGB 13.2 08/27/2020   HCT 40.8 08/27/2020   MCV 100.2 (H) 08/27/2020   PLT 265 08/27/2020   NEUTROABS 3.1 08/27/2020    ASSESSMENT:  1.  Stage Ia right breast cancer: -0.9 cm, ER/PR positive,  status post lumpectomy in February 2016, Oncotype DX score 4. -Status post XRT completed on 06/05/2015.  Anastrozole started in June 2016. -BCI testing in September 2018 showed low likelihood of benefit from extended adjuvant therapy. -She is tolerating anastrozole reasonably well. -Last mammogram was from 03/17/2020 which was BI-RADS Category 2.  2.  Osteopenia: -DEXA scan from 06/15/2019 showed T score -1.2 consistent with  the osteopenia. -She is receiving Prolia 34-monthinjections which was started in August 2016.  She will proceed with her Prolia today.  She does not have any side effects.  She will continue calcium and vitamin D.   PLAN:  1.  Stage Ia right breast cancer: -Physical exam today did not reveal any palpable masses. -Reviewed labs including LFTs which are grossly within normal limits. -We will schedule her next mammogram in March.  I plan to see her back in 6 months for follow-up. -I have told her to discontinue anastrozole today.  2.  Osteopenia: -Her vitamin D is 28.8.  Creatinine is 0.76 and calcium 9.5. -As we are discontinuing her anastrozole today, we will give her last injection of Prolia in 6 months and will discontinue it also.   Breast Cancer therapy associated bone loss: I have recommended calcium, Vitamin D and weight bearing exercises.    No orders of the defined types were placed in this encounter.  The patient has a good understanding of the overall plan. she agrees with it. she will call with any problems that may develop before the next visit here.    SDerek Jack MD [id number]  I, DMilinda Antis am acting as a scribe for Dr. SSanda Linger  I, SDerek JackMD, have reviewed the above documentation for accuracy and completeness, and I agree with the above.

## 2020-08-27 NOTE — Patient Instructions (Signed)
[facility] at [facility] Discharge Instructions  You were seen today by Dr. Delton Coombes. He went over your recent results and scans. You received your injection today. You will be scheduled for a mammogram before your next visit. Dr. Delton Coombes will see you back in 6 months for labs and follow up.   Thank you for choosing [place] at [facility] to provide your oncology and hematology care.  To afford each patient quality time with our provider, please arrive at least 15 minutes before your scheduled appointment time.   If you have a lab appointment with the [facility] please come in thru the Main Entrance and check in at the main information desk  You need to re-schedule your appointment should you arrive 10 or more minutes late.  We strive to give you quality time with our providers, and arriving late affects you and other patients whose appointments are after yours.  Also, if you no show three or more times for appointments you may be dismissed from the clinic at the providers discretion.     Again, thank you for choosing [facility].  Our hope is that these requests will decrease the amount of time that you wait before being seen by our physicians.       _____________________________________________________________  Should you have questions after your visit to [facility], please contact our office at [phone] between the hours of 8:00 a.m. and 4:30 p.m.  Voicemails left after 4:00 p.m. will not be returned until the following business day.  For prescription refill requests, have your pharmacy contact our office and allow 72 hours.    Cancer Center Support Programs:   > Cancer Support Group  2nd Tuesday of the month 1pm-2pm, Journey Room

## 2020-08-27 NOTE — Telephone Encounter (Signed)
Called and spoke with pt letting her know the info stated by Dr. Vaughan Browner and she said she received a call from insurance company that they were going to put symbicort on the preferred list. Pt said a 72-month supply for Symbicort 160 was only going to be about $130 which she is fine paying. Pt wants to remain on Symbicort 160. Stated to pt if she wanted to try to change inhalers later to call office and let  us know and she verbalized understanding. Nothing further needed.

## 2020-08-28 ENCOUNTER — Other Ambulatory Visit: Payer: Self-pay | Admitting: *Deleted

## 2020-08-28 LAB — VITAMIN D 25 HYDROXY (VIT D DEFICIENCY, FRACTURES): Vit D, 25-Hydroxy: 28.84 ng/mL — ABNORMAL LOW (ref 30–100)

## 2020-08-28 MED ORDER — LEVOCETIRIZINE DIHYDROCHLORIDE 5 MG PO TABS
5.0000 mg | ORAL_TABLET | Freq: Every evening | ORAL | 3 refills | Status: DC
Start: 2020-08-28 — End: 2021-04-08

## 2020-09-01 ENCOUNTER — Other Ambulatory Visit: Payer: Self-pay | Admitting: *Deleted

## 2020-09-01 MED ORDER — BUDESONIDE-FORMOTEROL FUMARATE 160-4.5 MCG/ACT IN AERO: 2.0000 | INHALATION_SPRAY | Freq: Two times a day (BID) | RESPIRATORY_TRACT | 0 refills | Status: DC

## 2020-09-07 ENCOUNTER — Other Ambulatory Visit: Payer: Self-pay

## 2020-09-07 ENCOUNTER — Encounter: Payer: Self-pay | Admitting: Podiatry

## 2020-09-07 ENCOUNTER — Ambulatory Visit: Payer: Medicare Other | Admitting: Podiatry

## 2020-09-07 DIAGNOSIS — B351 Tinea unguium: Secondary | ICD-10-CM | POA: Diagnosis not present

## 2020-09-07 DIAGNOSIS — M79675 Pain in left toe(s): Secondary | ICD-10-CM

## 2020-09-07 DIAGNOSIS — M79674 Pain in right toe(s): Secondary | ICD-10-CM | POA: Diagnosis not present

## 2020-09-07 NOTE — Progress Notes (Signed)
°  Subjective:  Patient ID: Crystal [name], female    DOB: July 30, 1941,  MRN: [id number]  Chief Complaint  Patient presents with   Nail Problem    pt is here for a nail trim, pt is also here for bil numbness in both feet.   79 y.o. female returns for the above complaint.  Patient presents with thickened elongated dystrophic toenails x10.  They are painful to touch.  Patient is not a diabetic.  She denies any other acute complaints she is not able debride down to self.  She would like some help debrided down.  Objective:  There were no vitals filed for this visit. Podiatric Exam: Vascular: dorsalis pedis and posterior tibial pulses are palpable bilateral. Capillary return is immediate. Temperature gradient is WNL. Skin turgor WNL  Sensorium: Normal Semmes Weinstein monofilament test. Normal tactile sensation bilaterally. Nail Exam: Pt has thick disfigured discolored nails with subungual debris noted bilateral entire nail hallux through fifth toenails.  Pain on palpation to the nails. Ulcer Exam: There is no evidence of ulcer or pre-ulcerative changes or infection. Orthopedic Exam: Muscle tone and strength are WNL. No limitations in general ROM. No crepitus or effusions noted. HAV  B/L.  Hammer toes 2-5  B/L. Skin: No Porokeratosis. No infection or ulcers    Assessment & Plan:   1. Pain due to onychomycosis of toenails of both feet     Patient was evaluated and treated and all questions answered.  Onychomycosis with pain  -Nails palliatively debrided as below. -Educated on self-care  Procedure: Nail Debridement Rationale: pain  Type of Debridement: manual, sharp debridement. Instrumentation: Nail nipper, rotary burr. Number of Nails: 10  Procedures and Treatment: Consent by patient was obtained for treatment procedures. The patient understood the discussion of treatment and procedures well. All questions were answered thoroughly reviewed. Debridement of mycotic and hypertrophic  toenails, 1 through 5 bilateral and clearing of subungual debris. No ulceration, no infection noted.  Return Visit-Office Procedure: Patient instructed to return to the office for a follow up visit 3 months for continued evaluation and treatment.  Boneta Lucks, DPM    No follow-ups on file.

## 2020-09-08 ENCOUNTER — Other Ambulatory Visit: Payer: Self-pay | Admitting: Pulmonary Disease

## 2020-09-16 ENCOUNTER — Other Ambulatory Visit (HOSPITAL_COMMUNITY): Payer: Self-pay | Admitting: Hematology

## 2020-09-16 DIAGNOSIS — C50411 Malignant neoplasm of upper-outer quadrant of right female breast: Secondary | ICD-10-CM

## 2020-09-25 NOTE — Telephone Encounter (Signed)
She is already on the higher dose of Symbicort She can try chlorpheniramine over-the-counter 8 mg 3 times daily Delsym over-the-counter for cough

## 2020-09-25 NOTE — Telephone Encounter (Signed)
Spoke with patient regarding prior message.Partient stated she has a dry cough no mucus, no fever. Patient stated Symbicort was helping and stopped. Patient was wondering if inhaler can dose be changed or something new.  Dr.Mannam   Good morning, just wanted to confirm you received my response yesterday?  I'm not coughing up anything and it's just a dry cough.   Nolon Stalls, Kimber Relic, RN  Joesph Fillers H Yesterday (8:59 AM)  HF Good morning, I am sorry to hear about the coughing.  I just need to get a little more information for the physician.  Are you coughing up any colored phlem?  Any fever, chills or body aches?  Are you experiencing any increase in shortness of breath?  What medication are you requesting be increased?  Once I have this information back, I will get it to the provider and respond back.  Thanks.  Heather.   Roda Shutters, NP Yesterday (5:26 AM)   Good morning. I have been coughing a lot and was wondering if they can up the dosage in my inhaler or if there is something you can prescribe to help with the coughing?  Thank you

## 2020-10-12 MED ORDER — BUDESONIDE-FORMOTEROL FUMARATE 160-4.5 MCG/ACT IN AERO: 2.0000 | INHALATION_SPRAY | Freq: Two times a day (BID) | RESPIRATORY_TRACT | 3 refills | Status: DC

## 2020-10-12 NOTE — Addendum Note (Signed)
Addended by: Lorretta Harp on: 10/12/2020 05:25 PM   Modules accepted: Orders

## 2020-11-12 ENCOUNTER — Encounter: Payer: Self-pay | Admitting: Podiatry

## 2020-11-26 DIAGNOSIS — G4733 Obstructive sleep apnea (adult) (pediatric): Secondary | ICD-10-CM | POA: Diagnosis not present

## 2020-11-27 DIAGNOSIS — F32A Depression, unspecified: Secondary | ICD-10-CM | POA: Diagnosis not present

## 2020-11-27 DIAGNOSIS — G4700 Insomnia, unspecified: Secondary | ICD-10-CM | POA: Diagnosis not present

## 2020-11-27 DIAGNOSIS — I10 Essential (primary) hypertension: Secondary | ICD-10-CM | POA: Diagnosis not present

## 2020-11-27 DIAGNOSIS — Z23 Encounter for immunization: Secondary | ICD-10-CM | POA: Diagnosis not present

## 2020-12-03 NOTE — Telephone Encounter (Signed)
Dr. Vaughan Browner, please see mychart message sent by pt and advise:  To: [facility] POOL    From: JAYDENCE VANYO    Created: 12/03/2020 10:27 AM     *-*-*This message was handled on 12/03/2020 12:12 PM by Quincey Nored P*-*-*  Good morning, I've been using my inhaler like normal and my cough is back and  can't seem to get it under control, is there anything they can prescribe to help? Not coughing anything up.  Thank you

## 2020-12-04 MED ORDER — BENZONATATE 200 MG PO CAPS: 200.0000 mg | ORAL_CAPSULE | Freq: Two times a day (BID) | ORAL | 1 refills | Status: DC | PRN

## 2020-12-04 NOTE — Telephone Encounter (Signed)
Order Tessalon 200 mg twice daily as needed for cough

## 2021-01-28 DIAGNOSIS — I10 Essential (primary) hypertension: Secondary | ICD-10-CM | POA: Diagnosis not present

## 2021-01-28 DIAGNOSIS — R053 Chronic cough: Secondary | ICD-10-CM | POA: Diagnosis not present

## 2021-02-15 ENCOUNTER — Telehealth: Payer: Self-pay

## 2021-02-15 NOTE — Telephone Encounter (Signed)
Called pt to let her know to bring in her SD card from her CPAP machine, Pt states she has not been using her CPAP machine and she has been sleeping fine without it. Nothing further needed.

## 2021-02-15 NOTE — Progress Notes (Signed)
@Patient  ID: Crystal [name], female    DOB: 1941/02/19, 80 y.o.   MRN: [id number]  Chief Complaint  Patient presents with  . Acute Visit    Productive cough with clear mucus has been worse in the last week. Symbicort not working well. Nodule in neck    Referring provider: Asencion Noble, MD  HPI: 80 year old female, never smoked. PMH significant for upper airway cough syndrome, OSA on CPAP, GERD, breast cancer, lumbar spinal stenosis, osteopenia. Patient of Dr. Vaughan Browner, last seen on 07/07/20. Normal spirometry in 2017 with minimal diffusion defect. Cough symptoms improved with nasal steroid spray, antihistamine and protonix 40mg .  02/16/2021 - Interim hx  Patient presents today for acute visit with complaints on cough. Accompanied by her daughter. During last visit she reports persistent dry cough with associated wheezing, sob and chest congested. Dr. Vaughan Browner started her on Symbicort in July 2021. Recommended to continue steroid nasal spray, chlorpheniramine and protonix.   Her cough has been particularly worse the last 2-3 days but has been persistent for the last several months-years. No particular trigger. She was taken off losartan 2-3 weeks ago by her PCP d/t cough. She has some associated nasal conegstion, post nasal drip and watery eyes. She does not have significant shortness of breath symptoms but gets winded with moderate exertion. She is still using Symbicort 110mcg two puffs twice a day. She is mostly compliant with flonase nasal spray and is taking pantoprazole twice a day currently. She is unsure if she is currently taking xyzal. She tried chlor-tabs without improvement. Daughter states that everything she takes for allergy symptoms helps for a minute and then her symptoms return. Eosinophils have been elevated between 200-400.   Data Reviewed: Imaging CXR 04/28/16 Stable linear scarring of the lingula compared to 2016. I have reviewed her images personally.  PFTs 04/25/16 FVC 3  [105%), FEV1 2.5 to [180%), F/F 84, TLC 190%, DLCO 81%. No obstruction, restriction. Minimal diffusion defect.  FENO 05/17/17- 5  Labs CBC 08/20/2018-WBC 5.3, eos 4%, absolute eosinophil count 212  Sleep  PSG 12/19/15 Moderate sleep apnea  Allergies  Allergen Reactions  . Bactrim [Sulfamethoxazole-Trimethoprim] Nausea Only    Immunization History  Administered Date(s) Administered  . Influenza, High Dose Seasonal PF 10/23/2018  . Influenza-Unspecified 09/10/2015  . Moderna Sars-Covid-2 Vaccination 02/19/2020, 03/18/2020, 12/10/2020  . Pneumococcal Conjugate-13 12/25/2014    Past Medical History:  Diagnosis Date  . Breast cancer ([place]) 03/21/15   right  . Chronic fatigue   . 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 Depression   . Diverticulosis   . Enlarged thyroid   . Gait abnormality 10/18/2017  . GERD (gastroesophageal reflux disease)   . HTN (hypertension)   . Lumbar spinal stenosis 11/07/2017   Severe at L4-5, moderate at L3-4  . 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 OA (osteoarthritis)   . Obesity   . PONV (postoperative nausea and vomiting)   . Ptosis of eyelid, left   . Sleep apnea    cpap  . Spinal stenosis   . Tubular adenoma of colon 08/2007    Tobacco History: Social History   Tobacco Use  Smoking Status Never Smoker  Smokeless Tobacco Never Used   Counseling given: Not Answered   Outpatient Medications Prior to Visit  Medication Sig Dispense Refill  . acetaminophen (TYLENOL) 650 MG CR tablet Take 1,300 mg by mouth 2 (two) times daily.     Marland Kitchen amLODipine (  NORVASC) 10 MG tablet Take 10 mg by mouth daily.    Marland Kitchen anastrozole (ARIMIDEX) 1 MG tablet TAKE 1 TABLET(1 MG) BY MOUTH DAILY 90 tablet 0  . Artificial Tear Solution (SOOTHE XP OP) Place 1 drop into both eyes 2 (two) times daily.    . benzonatate (TESSALON) 200 MG capsule Take 1 capsule (200 mg total) by mouth 2 (two) times daily as needed  for cough. 30 capsule 1  . budesonide-formoterol (SYMBICORT) 160-4.5 MCG/ACT inhaler Inhale 2 puffs into the lungs in the morning and at bedtime. 18 g 3  . buPROPion (WELLBUTRIN SR) 150 MG 12 hr tablet Take 150 mg by mouth 2 (two) times daily.    . Calcium-Vitamin D (CALTRATE 600 PLUS-VIT D PO) Take 1 tablet by mouth daily.    . chlorpheniramine (CHLOR-TRIMETON) 4 MG tablet Take 4 mg by mouth as needed. Pt takes daily for 2 weeks when she has a bad cough    . FLUoxetine (PROZAC) 20 MG capsule Take 60 mg by mouth daily.    . fluticasone (FLONASE) 50 MCG/ACT nasal spray Place 2 sprays into both nostrils daily.     Marland Kitchen ibuprofen (ADVIL) 200 MG tablet Take 200 mg by mouth 2 (two) times daily.    Marland Kitchen levocetirizine (XYZAL) 5 MG tablet Take 1 tablet (5 mg total) by mouth every evening. 90 tablet 3  . losartan-hydrochlorothiazide (HYZAAR) 100-25 MG per tablet Take 1 tablet by mouth daily.    . pantoprazole (PROTONIX) 40 MG tablet TAKE 1 TABLET BY MOUTH TWICE DAILY (Patient taking differently: 40 mg daily.) 60 tablet 5  . simvastatin (ZOCOR) 10 MG tablet Take 10 mg by mouth at bedtime.      No facility-administered medications prior to visit.   Review of Systems  Review of Systems  Constitutional: Negative.   HENT: Positive for postnasal drip. Negative for trouble swallowing.        Neck nodule  Eyes:       Watery eyes  Respiratory: Positive for cough. Negative for chest tightness, shortness of breath and wheezing.    Physical Exam  BP 118/78 (BP Location: Left Arm, Cuff Size: Large)   Pulse (!) 54   Temp 98.4 F (36.9 C)   Ht 5\' 3"  (1.6 m)   Wt 220 lb (99.8 kg)   SpO2 96%   BMI 38.97 kg/m  Physical Exam Constitutional:      Appearance: Normal appearance.  HENT:     Head: Normocephalic and atraumatic.     Mouth/Throat:     Mouth: Mucous membranes are moist.     Pharynx: Oropharynx is clear.  Neck:     Comments: Palpable left neck nodule, non-mobile  Cardiovascular:     Rate and  Rhythm: Normal rate and regular rhythm.  Pulmonary:     Effort: Pulmonary effort is normal.     Breath sounds: Normal breath sounds. No wheezing, rhonchi or rales.  Musculoskeletal:        General: Normal range of motion.  Skin:    General: Skin is warm and dry.  Neurological:     General: No focal deficit present.     Mental Status: She is alert and oriented to person, place, and time. Mental status is at baseline.  Psychiatric:        Mood and Affect: Mood normal.        Behavior: Behavior normal.        Thought Content: Thought content normal.  Judgment: Judgment normal.     Lab Results:  CBC    Component Value Date/Time   WBC 5.7 08/27/2020 1025   RBC 4.07 08/27/2020 1025   HGB 13.2 08/27/2020 1025   HCT 40.8 08/27/2020 1025   PLT 265 08/27/2020 1025   MCV 100.2 (H) 08/27/2020 1025   MCH 32.4 08/27/2020 1025   MCHC 32.4 08/27/2020 1025   RDW 13.2 08/27/2020 1025   LYMPHSABS 1.7 08/27/2020 1025   MONOABS 0.6 08/27/2020 1025   EOSABS 0.3 08/27/2020 1025   BASOSABS 0.0 08/27/2020 1025    BMET    Component Value Date/Time   NA 139 08/27/2020 1025   K 4.1 08/27/2020 1025   CL 103 08/27/2020 1025   CO2 24 08/27/2020 1025   GLUCOSE 76 08/27/2020 1025   BUN 23 08/27/2020 1025   CREATININE 0.76 08/27/2020 1025   CALCIUM 9.5 08/27/2020 1025   GFRNONAA >60 08/27/2020 1025   GFRAA >60 08/27/2020 1025    BNP No results found for: BNP  ProBNP No results found for: PROBNP  Imaging: No results found.   Assessment & Plan:   Chronic cough - Persistent cough for several years, particularly worse over last 2-3 days. Associated nasal congestion, PND and watery eyes. No particular trigger. Eosinophils elevated 200-400. - Continues Symbicort 160 two puffs twice daily (add aerochamber) - Continue Flonase nasal spray and Xyzal  - Adding Singulair 10mg  at bedtime - Refer to allergy - FU in 3 months with Dr. Vaughan Browner  Nodule of neck - Palpable non-mobile left  neck/thyroid nodule, approx 1.5-2cm  - Checking thyroid ultrasound to evaluate   GERD (gastroesophageal reflux disease) - Continue Protonix 40mg  twice daily    Martyn Ehrich, NP 02/16/2021

## 2021-02-16 ENCOUNTER — Encounter: Payer: Self-pay | Admitting: Primary Care

## 2021-02-16 ENCOUNTER — Other Ambulatory Visit: Payer: Self-pay

## 2021-02-16 ENCOUNTER — Ambulatory Visit: Payer: Medicare Other | Admitting: Primary Care

## 2021-02-16 VITALS — BP 118/78 | HR 54 | Temp 98.4°F | Ht 63.0 in | Wt 220.0 lb

## 2021-02-16 DIAGNOSIS — K219 Gastro-esophageal reflux disease without esophagitis: Secondary | ICD-10-CM | POA: Diagnosis not present

## 2021-02-16 DIAGNOSIS — R053 Chronic cough: Secondary | ICD-10-CM | POA: Diagnosis not present

## 2021-02-16 DIAGNOSIS — R059 Cough, unspecified: Secondary | ICD-10-CM | POA: Diagnosis not present

## 2021-02-16 DIAGNOSIS — R221 Localized swelling, mass and lump, neck: Secondary | ICD-10-CM | POA: Diagnosis not present

## 2021-02-16 MED ORDER — MONTELUKAST SODIUM 10 MG PO TABS: 10.0000 mg | ORAL_TABLET | Freq: Every day | ORAL | 5 refills | Status: DC

## 2021-02-16 NOTE — Assessment & Plan Note (Signed)
-   Palpable non-mobile left neck/thyroid nodule, approx 1.5-2cm  - Checking thyroid ultrasound to evaluate

## 2021-02-16 NOTE — Patient Instructions (Addendum)
Recommendations: - Start Singulair 10mg  at bedtime  - Verify that you are taking Xyzal 5mg  daily   - Continue Flonase nasal spray daily - Continue the Symbicort 2 puffs twice a day (rinse mouth after use) - Continue Protonix twice daily   Orders: - Thyroid ultrasound re: nodule neck   Referral: - Allergy re: cough/allergic rhinitis   Follow-up: - 3 months with Dr. Vaughan Browner or sooner if needed   Montelukast Tablets What is this medicine? MONTELUKAST (mon te LOO kast) is used to prevent and treat the symptoms of asthma. It is also used to treat allergies. Do not use for an acute asthma attack. This medicine may be used for other purposes; ask your health care provider or pharmacist if you have questions. COMMON BRAND NAME(S): Singulair What should I tell my health care provider before I take this medicine? They need to know if you have any of these conditions:  liver disease  an unusual or allergic reaction to montelukast, other medicines, foods, dyes, or preservatives  pregnant or trying to get pregnant  breast-feeding How should I use this medicine? This medicine should be given by mouth. Follow the directions on the prescription label. Take this medicine at the same time every day. You may take this medicine with or without meals. Do not chew the tablets. Do not stop taking your medicine unless your doctor tells you to. Talk to your pediatrician regarding the use of this medicine in children. Special care may be needed. While this drug may be prescribed for children as young as 11 years of age for selected conditions, precautions do apply. Overdosage: If you think you have taken too much of this medicine contact a poison control center or emergency room at once. NOTE: This medicine is only for you. Do not share this medicine with others. What if I miss a dose? If you miss a dose, skip it. Take your next dose at the normal time. Do not take extra or 2 doses at the same time to  make up for the missed dose. What may interact with this medicine?  anti-infectives like rifampin and rifabutin  medicines for seizures like phenytoin, phenobarbital, and carbamazepine This list may not describe all possible interactions. Give your health care provider a list of all the medicines, herbs, non-prescription drugs, or dietary supplements you use. Also tell them if you smoke, drink alcohol, or use illegal drugs. Some items may interact with your medicine. What should I watch for while using this medicine? Visit your doctor or health care professional for regular checks on your progress. Tell your doctor or health care professional if your allergy or asthma symptoms do not improve. Take your medicine even when you do not have symptoms. Do not stop taking any of your medicine(s) unless your doctor tells you to. If you have asthma, talk to your doctor about what to do in an acute asthma attack. Always have your inhaled rescue medicine for asthma attacks with you. Patients and their families should watch for new or worsening thoughts of suicide or depression. Also watch for sudden changes in feelings such as feeling anxious, agitated, panicky, irritable, hostile, aggressive, impulsive, severely restless, overly excited and hyperactive, or not being able to sleep. Any worsening of mood or thoughts of suicide or dying should be reported to your health care professional right away. What side effects may I notice from receiving this medicine? Side effects that you should report to your doctor or health care professional as soon as  possible:  allergic reactions like skin rash or hives, or swelling of the face, lips, or tongue  breathing problems  changes in emotions or moods  confusion  depressed mood  fever or infection  hallucinations  joint pain  painful lumps under the skin  pain, tingling, numbness in the hands or feet  redness, blistering, peeling, or loosening of the skin,  including inside the mouth  restlessness  seizures  sleep walking  signs and symptoms of infection like fever; chills; cough; sore throat; flu-like illness  signs and symptoms of liver injury like dark yellow or brown urine; general ill feeling or flu-like symptoms; light-colored stools; loss of appetite; nausea; right upper belly pain; unusually weak or tired; yellowing of the eyes or skin  sinus pain or swelling  stuttering  suicidal thoughts or other mood changes  tremors  trouble sleeping  uncontrolled muscle movements  unusual bleeding or bruising  vivid or bad dreams Side effects that usually do not require medical attention (report to your doctor or health care professional if they continue or are bothersome):  dizziness  drowsiness  headache  runny nose  stomach upset  tiredness This list may not describe all possible side effects. Call your doctor for medical advice about side effects. You may report side effects to FDA at 1-800-FDA-1088. Where should I keep my medicine? Keep out of the reach of children. Store at room temperature between 15 and 30 degrees C (59 and 86 degrees F). Protect from light and moisture. Keep this medicine in the original bottle. Throw away any unused medicine after the expiration date. NOTE: This sheet is a summary. It may not cover all possible information. If you have questions about this medicine, talk to your doctor, pharmacist, or health care provider.  2021 [employer] (2020-11-07 15:22:47)

## 2021-02-16 NOTE — Assessment & Plan Note (Signed)
-   Persistent cough for several years, particularly worse over last 2-3 days. Associated nasal congestion, PND and watery eyes. No particular trigger. Eosinophils elevated 200-400. - Continues Symbicort 160 two puffs twice daily (add aerochamber) - Continue Flonase nasal spray and Xyzal  - Adding Singulair 10mg  at bedtime - Refer to allergy - FU in 3 months with Dr. Vaughan Browner

## 2021-02-16 NOTE — Assessment & Plan Note (Signed)
Continue Protonix 40 mg twice daily. ?

## 2021-02-22 ENCOUNTER — Ambulatory Visit (HOSPITAL_COMMUNITY)
Admission: RE | Admit: 2021-02-22 | Discharge: 2021-02-22 | Disposition: A | Discharge status: Home / Self Care | Payer: Medicare Other | Source: Ambulatory Visit | Attending: Primary Care | Admitting: Primary Care

## 2021-02-22 ENCOUNTER — Other Ambulatory Visit: Payer: Self-pay

## 2021-02-22 DIAGNOSIS — R221 Localized swelling, mass and lump, neck: Secondary | ICD-10-CM | POA: Diagnosis not present

## 2021-02-22 DIAGNOSIS — E041 Nontoxic single thyroid nodule: Secondary | ICD-10-CM | POA: Diagnosis not present

## 2021-02-22 NOTE — Progress Notes (Signed)
Please let patient know her thyroid ultrasound showed enlarged herterogenous thyroid without worrisome nodules or mass. Follow up with PCP

## 2021-02-24 DIAGNOSIS — G4733 Obstructive sleep apnea (adult) (pediatric): Secondary | ICD-10-CM | POA: Diagnosis not present

## 2021-02-25 ENCOUNTER — Ambulatory Visit (HOSPITAL_COMMUNITY): Payer: Medicare Other

## 2021-02-25 ENCOUNTER — Ambulatory Visit (HOSPITAL_COMMUNITY): Payer: Medicare Other | Admitting: Hematology

## 2021-02-25 ENCOUNTER — Other Ambulatory Visit (HOSPITAL_COMMUNITY): Payer: Medicare Other

## 2021-02-27 ENCOUNTER — Emergency Department (HOSPITAL_COMMUNITY): Payer: Medicare Other

## 2021-02-27 ENCOUNTER — Other Ambulatory Visit: Payer: Self-pay

## 2021-02-27 ENCOUNTER — Emergency Department (HOSPITAL_COMMUNITY)
Admission: EM | Admit: 2021-02-27 | Discharge: 2021-02-27 | Disposition: A | Discharge status: Home / Self Care | Payer: Medicare Other | Attending: Emergency Medicine | Admitting: Emergency Medicine

## 2021-02-27 ENCOUNTER — Encounter (HOSPITAL_COMMUNITY): Payer: Self-pay | Admitting: Emergency Medicine

## 2021-02-27 DIAGNOSIS — R319 Hematuria, unspecified: Secondary | ICD-10-CM | POA: Diagnosis not present

## 2021-02-27 DIAGNOSIS — R053 Chronic cough: Secondary | ICD-10-CM | POA: Diagnosis not present

## 2021-02-27 DIAGNOSIS — N390 Urinary tract infection, site not specified: Secondary | ICD-10-CM

## 2021-02-27 DIAGNOSIS — Z79899 Other long term (current) drug therapy: Secondary | ICD-10-CM | POA: Insufficient documentation

## 2021-02-27 DIAGNOSIS — M4316 Spondylolisthesis, lumbar region: Secondary | ICD-10-CM | POA: Diagnosis not present

## 2021-02-27 DIAGNOSIS — N281 Cyst of kidney, acquired: Secondary | ICD-10-CM | POA: Diagnosis not present

## 2021-02-27 DIAGNOSIS — I10 Essential (primary) hypertension: Secondary | ICD-10-CM | POA: Diagnosis not present

## 2021-02-27 DIAGNOSIS — Z853 Personal history of malignant neoplasm of breast: Secondary | ICD-10-CM | POA: Diagnosis not present

## 2021-02-27 DIAGNOSIS — K449 Diaphragmatic hernia without obstruction or gangrene: Secondary | ICD-10-CM | POA: Diagnosis not present

## 2021-02-27 LAB — BASIC METABOLIC PANEL
Anion gap: 15 (ref 5–15)
BUN: 22 mg/dL (ref 8–23)
CO2: 24 mmol/L (ref 22–32)
Calcium: 9.5 mg/dL (ref 8.9–10.3)
Chloride: 100 mmol/L (ref 98–111)
Creatinine, Ser: 0.92 mg/dL (ref 0.44–1.00)
GFR, Estimated: >60 mL/min (ref >60)
Glucose, Bld: 106 mg/dL — ABNORMAL HIGH (ref 70–99)
Potassium: 3.6 mmol/L (ref 3.5–5.1)
Sodium: 139 mmol/L (ref 135–145)

## 2021-02-27 LAB — CBC
HCT: 42.6 % (ref 36.0–46.0)
Hemoglobin: 14.3 g/dL (ref 12.0–15.0)
MCH: 32.1 pg (ref 26.0–34.0)
MCHC: 33.6 g/dL (ref 30.0–36.0)
MCV: 95.7 fL (ref 80.0–100.0)
Platelets: 185 10*3/uL (ref 150–400)
RBC: 4.45 MIL/uL (ref 3.87–5.11)
RDW: 12.7 % (ref 11.5–15.5)
WBC: 9 10*3/uL (ref 4.0–10.5)
nRBC: 0 % (ref 0.0–0.2)

## 2021-02-27 LAB — URINALYSIS, ROUTINE W REFLEX MICROSCOPIC

## 2021-02-27 LAB — PROTIME-INR
INR: 0.9 (ref 0.8–1.2)
Prothrombin Time: 12.1 seconds (ref 11.4–15.2)

## 2021-02-27 LAB — URINALYSIS, MICROSCOPIC (REFLEX): WBC, UA: >50 WBC/hpf (ref 0–5)

## 2021-02-27 MED ORDER — SODIUM CHLORIDE 0.9 % IV SOLN
1.0000 g | Freq: Once | INTRAVENOUS | Status: AC
  Administered 2021-02-27: 1 g via INTRAVENOUS
  Filled 2021-02-27: qty 10

## 2021-02-27 MED ORDER — SODIUM CHLORIDE 0.9 % IV BOLUS
500.0000 mL | Freq: Once | INTRAVENOUS | Status: AC
  Administered 2021-02-27: 500 mL via INTRAVENOUS

## 2021-02-27 MED ORDER — CEPHALEXIN 500 MG PO CAPS: 500.0000 mg | ORAL_CAPSULE | Freq: Four times a day (QID) | ORAL | 0 refills | Status: DC

## 2021-02-27 NOTE — ED Triage Notes (Signed)
Pt states she is having lower abdominal pain and red blood in her urine, started this morning.

## 2021-02-27 NOTE — Discharge Instructions (Signed)
Please take antibiotics as prescribed Please return the emergency department if you are unable to urinate or having increasing frequent bleeding. Please call urology office Monday to arrange for outpatient follow-up regarding the blood in your urine.

## 2021-02-27 NOTE — ED Provider Notes (Addendum)
[facility] EMERGENCY DEPARTMENT Provider Note   CSN: [id number] Arrival date & time: 02/27/21  0710     History Chief Complaint  Patient presents with  . Hematuria    Crystal [name] is a 80 y.o. female.  HPI     80 year old female history of breast cancer, hypertension, GERD, ischemic colitis presents today complaining of blood in urine.  She states that she had to void at approximately 430 this morning.  She reports 4-5 episodes of urinating with blood in it since that time.  She has not had any pain or previous similar symptoms.  She denies headache, chest pain, fever, chills, nausea, vomiting, or diarrhea.  She reports cough.  Review of records reveal that she has had a chronic cough and she has recently been seen by her pulmonary doctors for similar.  Patient does not think she is on any blood thinners.  She has not had any similar episodes in past.  She endorses some lower abdominal crampy discomfort.  Past Medical History:  Diagnosis Date  . Breast cancer ([place]) 03/21/15   right  . Chronic fatigue   . Colitis, ischemic ([place]) 08/2007, 05/2011   Last colonoscopy/bx by Dr Rourk->(08/31/07) descending colon  . Depression   . Diverticulosis   . Enlarged thyroid   . Gait abnormality 10/18/2017  . GERD (gastroesophageal reflux disease)   . HTN (hypertension)   . Lumbar spinal stenosis 11/07/2017   Severe at L4-5, moderate at L3-4  . 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 OA (osteoarthritis)   . Obesity   . PONV (postoperative nausea and vomiting)   . Ptosis of eyelid, left   . Sleep apnea    cpap  . Spinal stenosis   . Tubular adenoma of colon 08/2007    Patient Active Problem List   Diagnosis Date Noted  . Chronic cough 02/16/2021  . Nodule of neck 02/16/2021  . GERD (gastroesophageal reflux disease) 02/16/2021  . Lumbar spinal stenosis 11/07/2017  . Gait abnormality 10/18/2017  . 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 Osteopenia determined by x-Simona Rocque 07/23/2015  . Breast cancer ([place])  03/31/2015  . Colitis, ischemic ([place]) 06/27/2011  . Tubular adenoma 06/27/2011    Past Surgical History:  Procedure Laterality Date  . ABDOMINAL HYSTERECTOMY     partial  . BREAST CYST EXCISION     benign, left x2, right x1  . CATARACT EXTRACTION W/PHACO  12/01/2011   Procedure: CATARACT EXTRACTION PHACO AND INTRAOCULAR LENS PLACEMENT (IOC);  Surgeon: Tonny Branch;  Location: [facility];  Service: Ophthalmology;  Laterality: Right;  CDE=14.87  . CATARACT EXTRACTION W/PHACO  02/06/2012   Procedure: CATARACT EXTRACTION PHACO AND INTRAOCULAR LENS PLACEMENT (IOC);  Surgeon: Tonny Branch, MD;  Location: [facility];  Service: Ophthalmology;  Laterality: Left;  CDE 13.00  . COLONOSCOPY  Sept 2008   Last colonoscopy/bx by Dr Rourk->(08/31/07) descending colon TUBULAR ADENOMA  . COLONOSCOPY  10/25/2012   Procedure: COLONOSCOPY;  Surgeon: Daneil Dolin, MD;  Location: [facility];  Service: Endoscopy;  Laterality: N/A;  10:15-changed to 10:30 Darius Bump notified  . PARTIAL MASTECTOMY WITH NEEDLE LOCALIZATION AND AXILLARY SENTINEL LYMPH NODE BX Right 03/18/2015   Procedure: PARTIAL MASTECTOMY AFTER NEEDLE LOCALIZATION AND AXILLARY SENTINEL LYMPH NODE BX;  Surgeon: Aviva Signs Md, MD;  Location: [facility];  Service: General;  Laterality: Right;  . PTOSIS REPAIR Left 05/11/2017   Procedure: INTERNAL PTOSIS REPAIR OF LEFT EYELID;  Surgeon: Clista Bernhardt, MD;  Location: [facility] [place];  Service: Ophthalmology;  Laterality: Left;  . TUBAL LIGATION  1970   hysterectomy     OB History   No obstetric history on file.     Family History  Problem Relation Age of Onset  . Cancer Brother 17  . Coronary artery disease Brother   . Diabetes Sister   . Heart disease Father   . Heart disease Brother   . Anesthesia problems Neg Hx   . Hypotension Neg Hx   . Pseudochol deficiency Neg Hx   . Malignant hyperthermia Neg Hx   . Colon cancer Neg Hx     Social History   Tobacco Use  . Smoking status: Never Smoker  .  Smokeless tobacco: Never Used  Vaping Use  . Vaping Use: Never used  Substance Use Topics  . Alcohol use: No  . Drug use: No    Home Medications Prior to Admission medications   Medication Sig Start Date End Date Taking? Authorizing Provider  acetaminophen (TYLENOL) 650 MG CR tablet Take 1,300 mg by mouth 2 (two) times daily.     [provider]  amLODipine (NORVASC) 10 MG tablet Take 10 mg by mouth daily.    [provider]  anastrozole (ARIMIDEX) 1 MG tablet TAKE 1 TABLET(1 MG) BY MOUTH DAILY 09/16/20   Lockamy, Randi L, NP-C  Artificial Tear Solution (SOOTHE XP OP) Place 1 drop into both eyes 2 (two) times daily.    [provider]  benzonatate (TESSALON) 200 MG capsule Take 1 capsule (200 mg total) by mouth 2 (two) times daily as needed for cough. 12/04/20   Mannam, Hart Robinsons, MD  budesonide-formoterol (SYMBICORT) 160-4.5 MCG/ACT inhaler Inhale 2 puffs into the lungs in the morning and at bedtime. 10/12/20   Mannam, Hart Robinsons, MD  buPROPion (WELLBUTRIN SR) 150 MG 12 hr tablet Take 150 mg by mouth 2 (two) times daily. 06/02/11   [provider]  Calcium-Vitamin D (CALTRATE 600 PLUS-VIT D PO) Take 1 tablet by mouth daily.    [provider]  chlorpheniramine (CHLOR-TRIMETON) 4 MG tablet Take 4 mg by mouth as needed. Pt takes daily for 2 weeks when she has a bad cough    [provider]  FLUoxetine (PROZAC) 20 MG capsule Take 60 mg by mouth daily. 06/23/11   [provider]  fluticasone (FLONASE) 50 MCG/ACT nasal spray Place 2 sprays into both nostrils daily.     [provider]  ibuprofen (ADVIL) 200 MG tablet Take 200 mg by mouth 2 (two) times daily.    [provider]  levocetirizine (XYZAL) 5 MG tablet Take 1 tablet (5 mg total) by mouth every evening. 08/28/20   Martyn Ehrich, NP  losartan-hydrochlorothiazide (HYZAAR) 100-25 MG per tablet Take 1 tablet by mouth daily.    [provider]  montelukast  (SINGULAIR) 10 MG tablet Take 1 tablet (10 mg total) by mouth at bedtime. 02/16/21   Martyn Ehrich, NP  pantoprazole (PROTONIX) 40 MG tablet TAKE 1 TABLET BY MOUTH TWICE DAILY Patient taking differently: 40 mg daily. 12/14/15   Mannam, Hart Robinsons, MD  simvastatin (ZOCOR) 10 MG tablet Take 10 mg by mouth at bedtime.     [provider]    Allergies    Bactrim [sulfamethoxazole-trimethoprim]  Review of Systems   Review of Systems  Physical Exam Updated Vital Signs There were no vitals taken for this visit.  Physical Exam Vitals and nursing note reviewed.  Constitutional:      Appearance: Normal appearance.  HENT:  Head: Normocephalic.     Right Ear: External ear normal.     Left Ear: External ear normal.     Nose: Nose normal.     Mouth/Throat:     Pharynx: Oropharynx is clear.  Eyes:     Pupils: Pupils are equal, round, and reactive to light.  Cardiovascular:     Rate and Rhythm: Normal rate.     Pulses: Normal pulses.  Pulmonary:     Effort: Pulmonary effort is normal.     Breath sounds: Normal breath sounds.  Abdominal:     General: Abdomen is flat.     Palpations: Abdomen is soft.  Musculoskeletal:        General: Normal range of motion.     Cervical back: Normal range of motion.  Skin:    General: Skin is warm.     Capillary Refill: Capillary refill takes less than 2 seconds.  Neurological:     General: No focal deficit present.     Mental Status: She is alert.  Psychiatric:        Mood and Affect: Mood normal.        Behavior: Behavior normal.     ED Results / Procedures / Treatments   Labs (all labs ordered are listed, but only abnormal results are displayed) Labs Reviewed - No data to display  EKG None  Radiology CT Renal Stone Study  Result Date: 02/27/2021 CLINICAL DATA:  80 year old female with hematuria and abdominal pain. EXAM: CT ABDOMEN AND PELVIS WITHOUT CONTRAST TECHNIQUE: Multidetector CT imaging of the abdomen and pelvis was  performed following the standard protocol without IV contrast. COMPARISON:  07/22/2019 CT and prior studies FINDINGS: Please note that parenchymal abnormalities may be missed without intravenous contrast. Lower chest: No acute abnormality. Hepatobiliary: The liver is unremarkable. Slightly increased density within the dependent gallbladder may represent sludge or cholelithiasis. There is no CT evidence of acute cholecystitis. No biliary dilatation. Pancreas: Mildly atrophic without other significant abnormality Spleen: Unremarkable Adrenals/Urinary Tract: A 3.5 x 3 x 5.5 cm LOWER RIGHT renal parapelvic cystic structure is identified with possible complexity. Other cysts within the RIGHT kidney are noted. The LEFT kidney is unremarkable. No hydronephrosis or urinary calculi identified. The bladder is unremarkable but not well distended. No adrenal abnormalities are identified. Stomach/Bowel: A large hiatal hernia has increased in size. There is no evidence of bowel obstruction, bowel wall thickening or inflammatory changes. Colonic diverticulosis is identified without evidence of diverticulitis. Vascular/Lymphatic: Aortic atherosclerosis. No enlarged abdominal or pelvic lymph nodes. Reproductive: Status post hysterectomy. No adnexal masses. Other: No ascites, pneumoperitoneum or focal collection. Pelvic floor laxity is noted. Musculoskeletal: No acute abnormality. Moderate to severe degenerative changes in the lumbar spine again noted with unchanged grade 1-grade 2 anterolisthesis of L4 on L5. IMPRESSION: 1. 3.5 x 3 x 5.5 cm cystic structure within the LOWER RIGHT kidney, with possible complexity. Given hematuria, recommend elective MRI with and without contrast if clinically able. 2. Enlarging large hiatal hernia. 3. Slightly increased density within the dependent gallbladder may represent sludge or cholelithiasis. No CT evidence of acute cholecystitis. 4. Aortic Atherosclerosis (ICD10-I70.0). Electronically Signed    By: Margarette Canada M.D.   On: 02/27/2021 08:45    Procedures Procedures   Medications Ordered in ED Medications - No data to display  ED Course  I have reviewed the triage vital signs and the nursing notes.  Pertinent labs & imaging results that were available during my care of the patient were reviewed  by me and considered in my medical decision making (see chart for details).  Clinical Course as of 02/27/21 1042  Sat Feb 27, 2021  0825 Cbc, bmet, inr reviewed [DR]    Clinical Course User Index [DR] Pattricia Boss, MD   MDM Rules/Calculators/A&P                          Discussed with Dr. Alinda Money- advised patient will need follow up regarding hematuria work-up.  However he has reviewed the films and feels that the cyst may not be related to the bleeding today.  Patient remains hemodynamically stable here in the emergency department hemoglobin is normal.  She is receiving Rocephin for probable urinary tract infection.  Patient is advised regarding return precautions and need for follow-up and voiced understanding. Final Clinical Impression(s) / ED Diagnoses Final diagnoses:  Urinary tract infection with hematuria, site unspecified  Hematuria, unspecified type  Renal cyst    Rx / DC Orders ED Discharge Orders    None       Pattricia Boss, MD 02/27/21 1045    Pattricia Boss, MD 03/12/21 1235

## 2021-03-02 LAB — URINE CULTURE: Culture: >=100000 — AB

## 2021-03-03 NOTE — Progress Notes (Signed)
ED Antimicrobial Stewardship Positive Culture Follow Up   Crystal [name] is an 80 y.o. female who presented to [facility] on 02/27/2021 with a chief complaint of  Chief Complaint  Patient presents with  . Hematuria    Recent Results (from the past 720 hour(s))  Urine Culture     Status: Abnormal   Collection Time: 02/27/21  8:30 AM   Specimen: Urine, Catheterized  Result Value Ref Range Status   Specimen Description   Final    URINE, CATHETERIZED Performed at [facility], [address], [place], [address] [address]    Special Requests   Final    NONE Performed at [facility], [address], [place],  [address]    Culture (A)  Final    >=100,000 COLONIES/mL ESCHERICHIA COLI >=100,000 COLONIES/mL PROTEUS VULGARIS    Report Status 03/02/2021 FINAL  Final   Organism ID, Bacteria ESCHERICHIA COLI (A)  Final   Organism ID, Bacteria PROTEUS VULGARIS (A)  Final      Susceptibility   Escherichia coli - MIC*    AMPICILLIN >=32 RESISTANT Resistant     CEFAZOLIN <=4 SENSITIVE Sensitive     CEFEPIME <=0.12 SENSITIVE Sensitive     CEFTRIAXONE <=0.25 SENSITIVE Sensitive     CIPROFLOXACIN <=0.25 SENSITIVE Sensitive     GENTAMICIN <=1 SENSITIVE Sensitive     IMIPENEM <=0.25 SENSITIVE Sensitive     NITROFURANTOIN <=16 SENSITIVE Sensitive     TRIMETH/SULFA <=20 SENSITIVE Sensitive     AMPICILLIN/SULBACTAM <=2 SENSITIVE Sensitive     PIP/TAZO <=4 SENSITIVE Sensitive     * >=100,000 COLONIES/mL ESCHERICHIA COLI   Proteus vulgaris - MIC*    AMPICILLIN >=32 RESISTANT Resistant     CEFAZOLIN >=64 RESISTANT Resistant     CEFEPIME <=0.12 SENSITIVE Sensitive     CIPROFLOXACIN <=0.25 SENSITIVE Sensitive     GENTAMICIN <=1 SENSITIVE Sensitive     IMIPENEM 2 SENSITIVE Sensitive     NITROFURANTOIN RESISTANT Resistant     TRIMETH/SULFA <=20 SENSITIVE Sensitive     AMPICILLIN/SULBACTAM 8 SENSITIVE Sensitive     PIP/TAZO <=4 SENSITIVE Sensitive     * >=100,000 COLONIES/mL PROTEUS VULGARIS     [x]  Treated with cephalexin, organism resistant to prescribed antimicrobial  STOP cephalexin  New antibiotic prescription: Augmentin 500 mg BID x7 days   ED Provider: Pati Gallo, PA-C   Thea Gist 03/03/2021, 9:35 AM Clinical Pharmacist Monday - Friday phone -  [phone] Saturday - Sunday phone - [phone]

## 2021-03-10 ENCOUNTER — Ambulatory Visit: Payer: Medicare Other | Admitting: Podiatry

## 2021-03-15 ENCOUNTER — Other Ambulatory Visit: Payer: Self-pay | Admitting: Pulmonary Disease

## 2021-03-22 ENCOUNTER — Ambulatory Visit (HOSPITAL_COMMUNITY)
Admission: RE | Admit: 2021-03-22 | Discharge: 2021-03-22 | Disposition: A | Discharge status: Home / Self Care | Payer: Medicare Other | Source: Ambulatory Visit | Attending: Hematology | Admitting: Hematology

## 2021-03-22 ENCOUNTER — Other Ambulatory Visit: Payer: Self-pay

## 2021-03-22 DIAGNOSIS — Z170 Estrogen receptor positive status [ER+]: Secondary | ICD-10-CM | POA: Diagnosis not present

## 2021-03-22 DIAGNOSIS — C50411 Malignant neoplasm of upper-outer quadrant of right female breast: Secondary | ICD-10-CM

## 2021-03-22 DIAGNOSIS — I10 Essential (primary) hypertension: Secondary | ICD-10-CM | POA: Diagnosis not present

## 2021-03-22 DIAGNOSIS — Z853 Personal history of malignant neoplasm of breast: Secondary | ICD-10-CM | POA: Diagnosis not present

## 2021-03-22 DIAGNOSIS — Z79899 Other long term (current) drug therapy: Secondary | ICD-10-CM | POA: Diagnosis not present

## 2021-03-22 DIAGNOSIS — Z1231 Encounter for screening mammogram for malignant neoplasm of breast: Secondary | ICD-10-CM | POA: Insufficient documentation

## 2021-03-22 DIAGNOSIS — E785 Hyperlipidemia, unspecified: Secondary | ICD-10-CM | POA: Diagnosis not present

## 2021-03-22 LAB — TSH: TSH: 0.1 — AB (ref 0.41–5.90)

## 2021-03-24 ENCOUNTER — Ambulatory Visit: Payer: Medicare Other | Admitting: Allergy & Immunology

## 2021-03-24 ENCOUNTER — Other Ambulatory Visit: Payer: Self-pay

## 2021-03-24 ENCOUNTER — Encounter: Payer: Self-pay | Admitting: Allergy & Immunology

## 2021-03-24 DIAGNOSIS — J310 Chronic rhinitis: Secondary | ICD-10-CM

## 2021-03-24 DIAGNOSIS — R053 Chronic cough: Secondary | ICD-10-CM

## 2021-03-24 MED ORDER — IPRATROPIUM BROMIDE 0.03 % NA SOLN: 2.0000 | Freq: Three times a day (TID) | NASAL | 12 refills | Status: DC

## 2021-03-24 NOTE — Patient Instructions (Addendum)
1. Chronic cough - Lung testing looked awesome today. - I am not going to make any changes to your inhaler medications since Dr. Rolla Etienne and Ms. Volanda Napoleon are managing this. - Continue with Symbicort two puffs twice daily. - Continue with albuterol as needed  2. Chronic rhinitis - Testing today showed: cat - Copy of test results provided.  - Avoidance measures provided. - Stop taking: Flonase (fluticasone)  - Start taking: Atrovent (ipratropium) one spray per nostril up to [place], but this can be OVER DRYING, so be careful!  - Hopefully, this new medication will help to decrease her postnasal drip and help with her cough.  3. Return in about 4 weeks (around 04/21/2021).    Please inform us of any Emergency Department visits, hospitalizations, or changes in symptoms. Call us before going to the ED for breathing or allergy symptoms since we might be able to fit you in for a sick visit. Feel free to contact us anytime with any questions, problems, or concerns.  It was a pleasure to meet you today!  Websites that have reliable patient information: 1. American Academy of Asthma, Allergy, and Immunology: www.aaaai.org 2. Food Allergy Research and Education (FARE): foodallergy.org 3. Mothers of Asthmatics: http://www.asthmacommunitynetwork.org 4. American College of Allergy, Asthma, and Immunology: www.acaai.org   COVID-19 Vaccine Information can be found at: [email or web address].[email or web address].uk For questions related to vaccine distribution or appointments, please email [email or web address] or call [phone].   We realize that you might be concerned about having an allergic reaction to the COVID19 vaccines. To help with that concern, WE ARE OFFERING THE COVID19 VACCINES IN OUR OFFICE! Ask the front desk for dates!     "Like" [place] on Facebook and Instagram for our latest updates!      A healthy democracy works best when [employer]  participate! Make sure you are registered to vote! If you have moved or changed any of your contact information, you will need to get this updated before voting!  In some cases, you MAY be able to register to vote online: [email or web address]   1. Control-Buffer 50% Glycerol Negative   2. Control-Histamine 1 mg/ml 2+   3. Albumin saline Negative   4. [place] Negative   5. [place] Negative   6. Johnson Negative   7. [place] Blue Negative   8. Meadow Fescue Negative   9. Perennial Rye Negative   10. Sweet Vernal Negative   11. Timothy Negative   12. Cocklebur Negative   13. Burweed Marshelder Negative   14. Ragweed, short Negative   15. Ragweed, Giant Negative   16. Plantain,  English Negative   17. Lamb's Quarters Negative   18. Sheep Sorrell Negative   19. Rough Pigweed Negative   20. Marsh Elder, Rough Negative   21. Mugwort, Common Negative   22. Ash mix Negative   23. Birch mix Negative   24. Beech American Negative   25. Box, Elder Negative   26. Cedar, red Negative   27. Cottonwood, [place] Negative   28. Elm mix Negative   29. Hickory Negative   30. Maple mix Negative   31. Oak, [place] mix Negative   32. Pecan Pollen Negative   33. Pine mix Negative   34. Sycamore Eastern Negative   35. [place], Black Pollen Negative   36. Alternaria alternata Negative   37. Cladosporium Herbarum Negative   38. Aspergillus mix Negative   39. Penicillium mix Negative   40. Bipolaris sorokiniana (Helminthosporium) Negative   41. Drechslera spicifera (Curvularia)  Negative   42. Mucor plumbeus Negative   43. Fusarium moniliforme Negative   44. Aureobasidium pullulans (pullulara) Negative   45. Rhizopus oryzae Negative   46. Botrytis cinera Negative   47. Epicoccum nigrum Negative   48. Phoma betae Negative   49. Candida Albicans Negative   50. Trichophyton mentagrophytes Negative   51. Mite, D Farinae  5,000 AU/ml Negative   52. Mite, D  Pteronyssinus  5,000 AU/ml Negative   53. Cat Hair 10,000 BAU/ml Negative   54.  Dog Epithelia Negative   55. Mixed Feathers Negative   56. Horse Epithelia Negative   57. Cockroach, German Negative   58. Mouse Negative   59. Tobacco Leaf Negative      Time Antigen Placed 1507    Allergen Manufacturer Greer    Location Arm    Number of Test 15    Intradermal Select    Control Negative    [place] Negative    Johnson Negative    7 Grass Negative    Ragweed mix Negative    Weed mix Negative    Tree mix Negative    Mold 1 Negative    Mold 2 Negative    Mold 3 Negative    Mold 4 Negative    Cat 1+    Dog Negative    Cockroach Negative    Mite mix Negative

## 2021-03-24 NOTE — Progress Notes (Signed)
NEW PATIENT  Date of Service/Encounter:  03/24/21  Consult requested by: Asencion Noble, MD   Assessment:   Chronic cough - likely multifactorial in nature  Perennial allergic rhinitis (cat)  History of stage Ia right breast cancer status post lumpectomy in February 2016 - recently discontinued anastrozole September 2021  Osteopenia   Crystal [name] is a lovely 80 y.o. female presenting for an evaluation of a chronic cough. Her time duration varies and she seems to have involved a number of specialists.  It is hard to wrap my head around her response to the treatment.  She is not the best historian.  Judging by our testing today, I still think allergies are involved.  She is sensitive only to cat but she does not even have a cat.  We are going to stop her Flonase and try to address her postnasal drip with Atrovent today.  Her lung testing looked great.  It did not change any of her breathing medication since she is followed by her excellent pulmonology team.  We will continue with the Symbicort 2 puffs twice daily.  Certainly biologics are consideration since she does have elevated eosinophils, but we will leave that decision up to the pulmonology team.  We will see her back in 4 weeks to see how she is doing and likely as needed thereafter.  Plan/Recommendations:   1. Chronic cough - Lung testing looked awesome today. - I am not going to make any changes to your inhaler medications since Dr. Rolla Etienne and Ms. Volanda Napoleon are managing this. - Continue with Symbicort two puffs twice daily. - Continue with albuterol as needed  2. Chronic rhinitis - Testing today showed: cat - Copy of test results provided.  - Avoidance measures provided. - Stop taking: Flonase (fluticasone)  - Start taking: Atrovent (ipratropium) one spray per nostril up to [place], but this can be OVER DRYING, so be careful!  - Hopefully, this new medication will help to decrease her postnasal drip and help with her  cough.  3. Return in about 4 weeks (around 04/21/2021).    This note in its entirety was forwarded to the Provider who requested this consultation.  Subjective:   Crystal [name] is a 80 y.o. female presenting today for evaluation of  Chief Complaint  Patient presents with  . Allergic Rhinitis     ED March 5 and stopped and came back yesterday but not as bad. Has had cough for years. Patient assumes the cough is caused by an allergy.    Crystal [name] has a history of the following: Patient Active Problem List   Diagnosis Date Noted  . Chronic cough 02/16/2021  . Nodule of neck 02/16/2021  . GERD (gastroesophageal reflux disease) 02/16/2021  . Lumbar spinal stenosis 11/07/2017  . Gait abnormality 10/18/2017  . 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 Osteopenia determined by x-ray 07/23/2015  . Breast cancer ([place]) 03/31/2015  . Colitis, ischemic ([place]) 06/27/2011  . Tubular adenoma 06/27/2011    History obtained from: chart review and patient.  Crystal [name] was referred by Asencion Noble, MD.     Crystal [name] is a 80 y.o. female presenting for an evaluation of a chronic cough. She had this cough for years. She was coughing every 30 minutes at the first of the year. She did have to go to the ED in early March for a UTI. She had an IV antibiotic for her her UTI. After she left the hospital, she did not cough any  more. She has been coughing a little bit for the last few days.    She is on Symbicort two puffs twice daily. She has been on that for 3 months. She was started on that by a pulmonologist. She follows with Dr. Rolla Etienne as well as NP Geraldo Pitter. The cough did resolve for a period of time, but then it recurred again. She has had a CXR in July 2021 that demonstrated a nodule in her left lung. 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   She denies any sneezing or itchy watery eyes.  She does have a  postnasal drip and constantly points to the top part of her throat when she talks about mucus being present and causing the cough.  It seems that Flonase is the only medication she has been tried on.  She does not think she has never undergone any kind of allergy testing.  Chest CT IMPRESSION: 1. The plain film abnormality is favored to be secondary to callus deposition indicative of healed fracture involving the anterior left fifth rib.  2. No dominant pulmonary nodule. A 4 mm left lower lobe pulmonary nodule with not be plain film visible. Given history of right breast cancer, consider chest CT follow-up at 6 months. This is felt unlikely to represent metastatic disease.  3. Soft tissue density within the left axilla is favored to be related to an enlarged collateral vessel (likely venous) in the setting of left chest wall collaterals. This could be re-evaluated at follow-up. If clinical concern of adenopathy, considered dedicated contrast-enhanced chest CT.   4. Moderate hiatal hernia. Esophageal air fluid level suggests dysmotility or gastroesophageal reflux.  5. Diffusely enlarged thyroid. Consider physical exam and possibly  ultrasound correlation.  Her losartan was stopped relatively recently. They thought that this was related to the coughing. This was one month ago and she has not seen any difference at all. Some of this might have helped and she did not "know it".   She is on pantoprazole twice daily before meals. She has been on that since 2021 sometime. She thinks that this has helped with her cough.  Again, it is difficult to get a true sense of it.  Otherwise, there is no history of other atopic diseases, including food allergies, stinging insect allergies, eczema, urticaria or contact dermatitis. There is no significant infectious history. Vaccinations are up to date.    Past Medical History: Patient Active Problem List   Diagnosis Date Noted  . Chronic cough 02/16/2021  .  Nodule of neck 02/16/2021  . GERD (gastroesophageal reflux disease) 02/16/2021  . Lumbar spinal stenosis 11/07/2017  . Gait abnormality 10/18/2017  . 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 Osteopenia determined by x-ray 07/23/2015  . Breast cancer ([place]) 03/31/2015  . 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 Tubular adenoma 06/27/2011    Medication List:  Allergies as of 03/24/2021      Reactions   Bactrim [sulfamethoxazole-trimethoprim] Nausea Only      Medication List       Accurate as of March 24, 2021  3:27 PM. If you have any questions, ask your nurse or doctor.        STOP taking these medications   anastrozole 1 MG tablet Commonly known as: ARIMIDEX Stopped by: Valentina Shaggy, MD   benzonatate 200 MG capsule Commonly known as: TESSALON Stopped by: Valentina Shaggy, MD   cephALEXin 500 MG  capsule Commonly known as: KEFLEX Stopped by: Valentina Shaggy, MD   chlorpheniramine 4 MG tablet Commonly known as: CHLOR-TRIMETON Stopped by: Valentina Shaggy, MD   metoprolol succinate 25 MG 24 hr tablet Commonly known as: TOPROL-XL Stopped by: Valentina Shaggy, MD     TAKE these medications   acetaminophen 650 MG CR tablet Commonly known as: TYLENOL Take 1,300 mg by mouth 2 (two) times daily.   amLODipine 10 MG tablet Commonly known as: NORVASC Take 10 mg by mouth daily.   buPROPion 150 MG 12 hr tablet Commonly known as: WELLBUTRIN SR Take 150 mg by mouth 2 (two) times daily.   CALTRATE 600 PLUS-VIT D PO Take 1 tablet by mouth daily.   chlorthalidone 25 MG tablet Commonly known as: HYGROTON Take 25 mg by mouth every morning.   FLUoxetine 20 MG capsule Commonly known as: PROZAC Take 20 mg by mouth daily.   fluticasone 50 MCG/ACT nasal spray Commonly known as: FLONASE Place 2 sprays into both nostrils daily.   levocetirizine 5 MG tablet Commonly known as: Xyzal Take 1 tablet (5 mg total) by mouth every evening.   montelukast 10 MG  tablet Commonly known as: SINGULAIR Take 1 tablet (10 mg total) by mouth at bedtime.   pantoprazole 40 MG tablet Commonly known as: PROTONIX TAKE 1 TABLET BY MOUTH TWICE DAILY What changed: how to take this   simvastatin 10 MG tablet Commonly known as: ZOCOR Take 10 mg by mouth at bedtime.   SOOTHE XP OP Place 1 drop into both eyes 2 (two) times daily.   Symbicort 160-4.5 MCG/ACT inhaler Generic drug: budesonide-formoterol INHALE 2 PUFFS INTO THE LUNGS IN THE MORNING AND AT BEDTIME       Birth History: non-contributory  Developmental History: non-contributory  Past Surgical History: Past Surgical History:  Procedure Laterality Date  . ABDOMINAL HYSTERECTOMY     partial  . BREAST CYST EXCISION     benign, left x2, right x1  . CATARACT EXTRACTION W/PHACO  12/01/2011   Procedure: CATARACT EXTRACTION PHACO AND INTRAOCULAR LENS PLACEMENT (IOC);  Surgeon: Tonny Branch;  Location: [facility];  Service: Ophthalmology;  Laterality: Right;  CDE=14.87  . CATARACT EXTRACTION W/PHACO  02/06/2012   Procedure: CATARACT EXTRACTION PHACO AND INTRAOCULAR LENS PLACEMENT (IOC);  Surgeon: Tonny Branch, MD;  Location: [facility];  Service: Ophthalmology;  Laterality: Left;  CDE 13.00  . COLONOSCOPY  Sept 2008   Last colonoscopy/bx by Dr Rourk->(08/31/07) descending colon TUBULAR ADENOMA  . COLONOSCOPY  10/25/2012   Procedure: COLONOSCOPY;  Surgeon: Daneil Dolin, MD;  Location: [facility];  Service: Endoscopy;  Laterality: N/A;  10:15-changed to 10:30 Darius Bump notified  . PARTIAL MASTECTOMY WITH NEEDLE LOCALIZATION AND AXILLARY SENTINEL LYMPH NODE BX Right 03/18/2015   Procedure: PARTIAL MASTECTOMY AFTER NEEDLE LOCALIZATION AND AXILLARY SENTINEL LYMPH NODE BX;  Surgeon: Aviva Signs Md, MD;  Location: [facility];  Service: General;  Laterality: Right;  . PTOSIS REPAIR Left 05/11/2017   Procedure: INTERNAL PTOSIS REPAIR OF LEFT EYELID;  Surgeon: Clista Bernhardt, MD;  Location: [facility];  Service: Ophthalmology;   Laterality: Left;  . TUBAL LIGATION  1970   hysterectomy     Family History: Family History  Problem Relation Age of Onset  . Cancer Brother 41  . Coronary artery disease Brother   . Diabetes Sister   . Heart disease Father   . Heart disease Brother   . Anesthesia problems Neg Hx   . Hypotension Neg Hx   .  Pseudochol deficiency Neg Hx   . Malignant hyperthermia Neg Hx   . Colon cancer Neg Hx      Social History: Crystal [name] lives at home in an apartment with carpeting throughout.  She has electric heating and central cooling.  There are no animals inside the home.  There are no animals outside of the home.  She does not have dust mite covers on the bedding.  There is no tobacco exposure.  She is currently retired.  She is not exposed to fumes, chemicals, or dust.  She does have a HEPA filter in her home.    Review of Systems  Constitutional: Negative.  Negative for chills, fever, malaise/fatigue and weight loss.  HENT: Negative for congestion, ear discharge, ear pain and sinus pain.        Positive for postnasal drip.  Eyes: Negative for pain, discharge and redness.  Respiratory: Positive for cough. Negative for sputum production, shortness of breath and wheezing.   Cardiovascular: Negative.  Negative for chest pain and palpitations.  Gastrointestinal: Negative for abdominal pain, constipation, diarrhea, heartburn, nausea and vomiting.  Skin: Negative.  Negative for itching and rash.  Neurological: Negative for dizziness and headaches.  Endo/Heme/Allergies: Positive for environmental allergies. Does not bruise/bleed easily.       Objective:   Blood pressure (!) 140/52, pulse 67, temperature 97.7 F (36.5 C), resp. rate 14, height 5\' 3"  (1.6 m), weight 217 lb 9.6 oz (98.7 kg), SpO2 95 %. Body mass index is 38.55 kg/m.   Physical Exam:   Physical Exam Constitutional:      Appearance: She is well-developed.  HENT:     Head: Normocephalic and atraumatic.     Right Ear:  Tympanic membrane, ear canal and external ear normal. No drainage, swelling or tenderness. Tympanic membrane is not injected, scarred, erythematous, retracted or bulging.     Left Ear: Tympanic membrane, ear canal and external ear normal. No drainage, swelling or tenderness. Tympanic membrane is not injected, scarred, erythematous, retracted or bulging.     Nose: Rhinorrhea present. No nasal deformity, septal deviation or mucosal edema.     Right Turbinates: Enlarged. Not swollen.     Left Turbinates: Enlarged. Not swollen.     Right Sinus: No maxillary sinus tenderness or frontal sinus tenderness.     Left Sinus: No maxillary sinus tenderness or frontal sinus tenderness.     Comments: She does have quite a bit of clear rhinorrhea.    Mouth/Throat:     Mouth: Mucous membranes are not pale and not dry.     Pharynx: Uvula midline.  Eyes:     General: Lids are normal. Gaze aligned appropriately. No allergic shiner.       Right eye: No discharge.        Left eye: No discharge.     Conjunctiva/sclera: Conjunctivae normal.     Right eye: Right conjunctiva is not injected. No chemosis.    Left eye: Left conjunctiva is not injected. No chemosis.    Pupils: Pupils are equal, round, and reactive to light.  Cardiovascular:     Rate and Rhythm: Normal rate and regular rhythm.     Heart sounds: Normal heart sounds.  Pulmonary:     Effort: Pulmonary effort is normal. No tachypnea, accessory muscle usage or respiratory distress.     Breath sounds: Normal breath sounds. No wheezing, rhonchi or rales.     Comments: Intermittent coarse upper airway sounds.  No wheezing.  Moving air well in all  lung fields. Chest:     Chest wall: No tenderness.  Abdominal:     Tenderness: There is no abdominal tenderness. There is no guarding or rebound.  Lymphadenopathy:     Head:     Right side of head: No submandibular, tonsillar or occipital adenopathy.     Left side of head: No submandibular, tonsillar or  occipital adenopathy.     Cervical: No cervical adenopathy.  Skin:    Coloration: Skin is not pale.     Findings: No abrasion, erythema, petechiae or rash. Rash is not papular, urticarial or vesicular.  Neurological:     Mental Status: She is alert.  Psychiatric:        Behavior: Behavior is cooperative.      Diagnostic studies:   Spirometry: results normal (FEV1: 1.70/92%, FVC: 2.27/91%, FEV1/FVC: 75%).    Spirometry consistent with normal pattern.   Allergy Studies:     Airborne Adult Perc - 03/24/21 1440    Time Antigen Placed 1440    Allergen Manufacturer Lavella Hammock    Location Back    Number of Test 59    1. Control-Buffer 50% Glycerol Negative    2. Control-Histamine 1 mg/ml 2+    3. Albumin saline Negative    4. Piatt Negative    5. [place] Negative    6. Johnson Negative    7. [place] Blue Negative    8. Meadow Fescue Negative    9. Perennial Rye Negative    10. Sweet Vernal Negative    11. Timothy Negative    12. Cocklebur Negative    13. Burweed Marshelder Negative    14. Ragweed, short Negative    15. Ragweed, Giant Negative    16. Plantain,  English Negative    17. Lamb's Quarters Negative    18. Sheep Sorrell Negative    19. Rough Pigweed Negative    20. Marsh Elder, Rough Negative    21. Mugwort, Common Negative    22. Ash mix Negative    23. Birch mix Negative    24. Beech American Negative    25. Box, Elder Negative    26. Cedar, red Negative    27. Cottonwood, [place] Negative    28. Elm mix Negative    29. Hickory Negative    30. Maple mix Negative    31. Oak, [place] mix Negative    32. Pecan Pollen Negative    33. Pine mix Negative    34. Sycamore Eastern Negative    35. Garrison, Black Pollen Negative    36. Alternaria alternata Negative    37. Cladosporium Herbarum Negative    38. Aspergillus mix Negative    39. Penicillium mix Negative    40. Bipolaris sorokiniana (Helminthosporium) Negative    41. Drechslera spicifera (Curvularia)  Negative    42. Mucor plumbeus Negative    43. Fusarium moniliforme Negative    44. Aureobasidium pullulans (pullulara) Negative    45. Rhizopus oryzae Negative    46. Botrytis cinera Negative    47. Epicoccum nigrum Negative    48. Phoma betae Negative    49. Candida Albicans Negative    50. Trichophyton mentagrophytes Negative    51. Mite, D Farinae  5,000 AU/ml Negative    52. Mite, D Pteronyssinus  5,000 AU/ml Negative    53. Cat Hair 10,000 BAU/ml Negative    54.  Dog Epithelia Negative    55. Mixed Feathers Negative    56. Horse Epithelia Negative  57. Cockroach, German Negative    58. Mouse Negative    59. Tobacco Leaf Negative          Intradermal - 03/24/21 1507    Time Antigen Placed 1507    Allergen Manufacturer Lavella Hammock    Location Arm    Number of Test 15    Intradermal Select    Control Negative    [place] Negative    Johnson Negative    7 Grass Negative    Ragweed mix Negative    Weed mix Negative    Tree mix Negative    Mold 1 Negative    Mold 2 Negative    Mold 3 Negative    Mold 4 Negative    Cat 1+    Dog Negative    Cockroach Negative    Mite mix Negative           Allergy testing results were read and interpreted by myself, documented by clinical staff.         Salvatore Marvel, MD [facility]

## 2021-03-25 ENCOUNTER — Inpatient Hospital Stay (HOSPITAL_COMMUNITY): Payer: Medicare Other

## 2021-03-25 ENCOUNTER — Inpatient Hospital Stay (HOSPITAL_COMMUNITY): Payer: Medicare Other | Admitting: Hematology

## 2021-03-25 ENCOUNTER — Inpatient Hospital Stay (HOSPITAL_COMMUNITY): Payer: Medicare Other | Attending: Hematology

## 2021-03-25 ENCOUNTER — Other Ambulatory Visit: Payer: Self-pay

## 2021-03-25 VITALS — BP 161/53 | HR 64 | Temp 96.9°F | Resp 20 | Wt 217.8 lb

## 2021-03-25 VITALS — BP 161/53 | HR 64 | Temp 96.9°F | Resp 20

## 2021-03-25 DIAGNOSIS — C50411 Malignant neoplasm of upper-outer quadrant of right female breast: Secondary | ICD-10-CM | POA: Diagnosis not present

## 2021-03-25 DIAGNOSIS — Z170 Estrogen receptor positive status [ER+]: Secondary | ICD-10-CM | POA: Diagnosis not present

## 2021-03-25 DIAGNOSIS — Z9223 Personal history of estrogen therapy: Secondary | ICD-10-CM | POA: Insufficient documentation

## 2021-03-25 DIAGNOSIS — Z79899 Other long term (current) drug therapy: Secondary | ICD-10-CM | POA: Insufficient documentation

## 2021-03-25 DIAGNOSIS — M8580 Other specified disorders of bone density and structure, unspecified site: Secondary | ICD-10-CM | POA: Diagnosis not present

## 2021-03-25 DIAGNOSIS — Z923 Personal history of irradiation: Secondary | ICD-10-CM | POA: Diagnosis not present

## 2021-03-25 DIAGNOSIS — R42 Dizziness and giddiness: Secondary | ICD-10-CM | POA: Insufficient documentation

## 2021-03-25 LAB — COMPREHENSIVE METABOLIC PANEL
ALT: 20 U/L (ref 0–44)
AST: 25 U/L (ref 15–41)
Albumin: 4 g/dL (ref 3.5–5.0)
Alkaline Phosphatase: 57 U/L (ref 38–126)
Anion gap: 10 (ref 5–15)
BUN: 20 mg/dL (ref 8–23)
CO2: 29 mmol/L (ref 22–32)
Calcium: 9.6 mg/dL (ref 8.9–10.3)
Chloride: 100 mmol/L (ref 98–111)
Creatinine, Ser: 0.9 mg/dL (ref 0.44–1.00)
GFR, Estimated: >60 mL/min (ref >60)
Glucose, Bld: 92 mg/dL (ref 70–99)
Potassium: 3.4 mmol/L — ABNORMAL LOW (ref 3.5–5.1)
Sodium: 139 mmol/L (ref 135–145)
Total Bilirubin: 0.7 mg/dL (ref 0.3–1.2)
Total Protein: 7.1 g/dL (ref 6.5–8.1)

## 2021-03-25 LAB — CBC WITH DIFFERENTIAL/PLATELET
Abs Immature Granulocytes: 0.01 10*3/uL (ref 0.00–0.07)
Basophils Absolute: 0 10*3/uL (ref 0.0–0.1)
Basophils Relative: 1 %
Eosinophils Absolute: 0.2 10*3/uL (ref 0.0–0.5)
Eosinophils Relative: 3 %
HCT: 40.5 % (ref 36.0–46.0)
Hemoglobin: 13.3 g/dL (ref 12.0–15.0)
Immature Granulocytes: 0 %
Lymphocytes Relative: 27 %
Lymphs Abs: 1.5 10*3/uL (ref 0.7–4.0)
MCH: 31.7 pg (ref 26.0–34.0)
MCHC: 32.8 g/dL (ref 30.0–36.0)
MCV: 96.4 fL (ref 80.0–100.0)
Monocytes Absolute: 0.5 10*3/uL (ref 0.1–1.0)
Monocytes Relative: 10 %
Neutro Abs: 3.2 10*3/uL (ref 1.7–7.7)
Neutrophils Relative %: 59 %
Platelets: 212 10*3/uL (ref 150–400)
RBC: 4.2 MIL/uL (ref 3.87–5.11)
RDW: 12.9 % (ref 11.5–15.5)
WBC: 5.4 10*3/uL (ref 4.0–10.5)
nRBC: 0 % (ref 0.0–0.2)

## 2021-03-25 LAB — VITAMIN D 25 HYDROXY (VIT D DEFICIENCY, FRACTURES): Vit D, 25-Hydroxy: 31.04 ng/mL (ref 30–100)

## 2021-03-25 MED ORDER — DENOSUMAB 60 MG/ML ~~LOC~~ SOSY
60.0000 mg | PREFILLED_SYRINGE | Freq: Once | SUBCUTANEOUS | Status: AC
  Administered 2021-03-25: 60 mg via SUBCUTANEOUS

## 2021-03-25 NOTE — Patient Instructions (Signed)
Prolia injection today.  Follow up as scheduled.  Please call the clinic if you have any questions or concerns.

## 2021-03-25 NOTE — Progress Notes (Signed)
Pt here for prolia injection.  Pt is taking calcium with vitamin D.  Calcium is 9.6 .  Last injection was 08/2020.  Vital signs WNL for treatment today.    Crystal [name] presents today for injection per the provider's orders.  Prolia administration without incident; injection site WNL; see MAR for injection details.  Patient tolerated procedure well and without incident.  No questions or complaints noted at this time.  Pt stable during and after injection. Discharged in stable condition ambulatory with walker.

## 2021-03-25 NOTE — Progress Notes (Signed)
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 Arnoldo [name]    03/18/2015 Pathology Results   - INVASIVE DUCTAL CARCINOMA, NOTTINGHAM COMBINED HISTOLOGIC GRADE II, 0.9 CM. - LOW GRADE DUCTAL CARCINOMA IN SITU. - RESECTION MARGINS, NEGATIVE FOR ATYPIA OR MALIGNANCY.   03/31/2015 Initial Diagnosis   Breast cancer   04/02/2015 Oncotype testing   Recurrence score of 4, low-risk.   04/30/2015 - 06/05/2015 Radiation Therapy   Right breast 50 Gy at 2 Gy/fraction x 25 fractions.  Dr. Pablo [name]   06/08/2015 Imaging   DEXA-  BMD as determined from Femur Neck Right is 0.843 g/cm2 with a T-Score of -1.4. This patient is considered osteopenic according to [name]) criteria.   06/10/2015 -  Anti-estrogen oral therapy   Arimidex   09/15/2017 Miscellaneous   BCI: 3.6% risk of late recurrence. Low likelihood of benefit from extended endocrine therapy.     CHIEF COMPLIANT: Follow-up for right breast cancer   INTERVAL HISTORY: [name] is a 80 y.o. female here today for follow up of her right breast cancer. Her last visit was on 08/27/2020.   Today she reports feeling okay. She stopped taking Arimidex but continues taking calcium and vitamin D. She continues using a rollator due to feeling unsteady and dizzy when she stands.   REVIEW OF SYSTEMS:   Review of Systems  Constitutional: Positive for fatigue (25%). Negative for appetite change.  Respiratory: Positive for cough.   Gastrointestinal: Positive for constipation.  Neurological:  Positive for dizziness (occasional).  All other systems reviewed and are negative.   I have reviewed the past medical history, past surgical history, social history and family history with the patient and they are unchanged from previous note.   ALLERGIES:   is allergic to bactrim [sulfamethoxazole-trimethoprim].   MEDICATIONS:  Current Outpatient Medications  Medication Sig Dispense Refill  . amLODipine (NORVASC) 10 MG tablet Take 10 mg by mouth daily.    . Artificial Tear Solution (SOOTHE XP OP) Place 1 drop into both eyes 2 (two) times daily.    Marland Kitchen buPROPion (WELLBUTRIN SR) 150 MG 12 hr tablet Take 150 mg by mouth 2 (two) times daily.    . Calcium-Vitamin D (CALTRATE 600 PLUS-VIT D PO) Take 1 tablet by mouth daily.    . chlorthalidone (HYGROTON) 25 MG tablet Take 25 mg by mouth every morning.    Marland Kitchen FLUoxetine (PROZAC) 20 MG capsule Take 20 mg by mouth daily.    . fluticasone (FLONASE) 50 MCG/ACT nasal spray Place 2 sprays into both nostrils daily.     Marland Kitchen ipratropium (ATROVENT) 0.03 % nasal spray Place 2 sprays into both nostrils 3 (three) times daily. 30 mL 12  . levocetirizine (XYZAL) 5 MG tablet Take 1 tablet (5 mg total) by mouth every evening. 90 tablet 3  . montelukast (SINGULAIR) 10 MG tablet Take 1 tablet (10 mg total) by mouth at bedtime. 30 tablet 5  . pantoprazole (PROTONIX) 40 MG tablet TAKE 1 TABLET BY MOUTH TWICE DAILY (Patient taking differently: 40 mg 2 (  two) times daily.) 60 tablet 5  . simvastatin (ZOCOR) 10 MG tablet Take 10 mg by mouth at bedtime.     . SYMBICORT 160-4.5 MCG/ACT inhaler INHALE 2 PUFFS INTO THE LUNGS IN THE MORNING AND AT BEDTIME 10.2 g 5  . acetaminophen (TYLENOL) 650 MG CR tablet Take 1,300 mg by mouth 2 (two) times daily.  (Patient not taking: Reported on 03/25/2021)     No current facility-administered medications for this visit.     PHYSICAL EXAMINATION: Performance status (ECOG): 1 - Symptomatic but completely ambulatory  Vitals:   03/25/21  1136  BP: (!) 161/53  Pulse: 64  Resp: 20  Temp: (!) 96.9 F (36.1 C)  SpO2: 94%   Wt Readings from Last 3 Encounters:  03/25/21 217 lb 13 oz (98.8 kg)  03/24/21 217 lb 9.6 oz (98.7 kg)  02/27/21 220 lb (99.8 kg)   Physical Exam Vitals reviewed.  Constitutional:      Appearance: Normal appearance. She is obese.     Comments: With rollator  Cardiovascular:     Rate and Rhythm: Normal rate and regular rhythm.     Pulses: Normal pulses.     Heart sounds: Normal heart sounds.  Pulmonary:     Effort: Pulmonary effort is normal.     Breath sounds: Normal breath sounds.  Chest:  Breasts:     Right: No swelling, bleeding, inverted nipple, mass, nipple discharge, skin change (lumpectomy scar healed), tenderness, axillary adenopathy or supraclavicular adenopathy.     Left: No swelling, bleeding, inverted nipple, mass, nipple discharge, skin change (lumpectomy scar healed), tenderness, axillary adenopathy or supraclavicular adenopathy.    Abdominal:     Palpations: Abdomen is soft. There is no mass.     Tenderness: There is no abdominal tenderness.     Hernia: No hernia is present.  Musculoskeletal:     Right lower leg: No edema.     Left lower leg: No edema.  Lymphadenopathy:     Cervical: No cervical adenopathy.     Upper Body:     Right upper body: No supraclavicular, axillary or pectoral adenopathy.     Left upper body: No supraclavicular, axillary or pectoral adenopathy.  Neurological:     General: No focal deficit present.     Mental Status: She is alert and oriented to person, place, and time.  Psychiatric:        Mood and Affect: Mood normal.        Behavior: Behavior normal.     Breast Exam Chaperone: Crystal Antis, MD     LABORATORY DATA:  I have reviewed the data as listed CMP Latest Ref Rng & Units 03/25/2021 02/27/2021 08/27/2020  Glucose 70 - 99 mg/dL 92 106(H) 76  BUN 8 - 23 mg/dL 20 22 23   Creatinine 0.44 - 1.00 mg/dL 0.90 0.92 0.76  Sodium 135 - 145  mmol/L 139 139 139  Potassium 3.5 - 5.1 mmol/L 3.4(L) 3.6 4.1  Chloride 98 - 111 mmol/L 100 100 103  CO2 22 - 32 mmol/L 29 24 24   Calcium 8.9 - 10.3 mg/dL 9.6 9.5 9.5  Total Protein 6.5 - 8.1 g/dL 7.1 - 7.4  Total Bilirubin 0.3 - 1.2 mg/dL 0.7 - 0.9  Alkaline Phos 38 - 126 U/L 57 - 54  AST 15 - 41 U/L 25 - 24  ALT 0 - 44 U/L 20 - 22   No results found for: [id number] Lab Results  Component Value Date   WBC 5.4 03/25/2021  HGB 13.3 03/25/2021   HCT 40.5 03/25/2021   MCV 96.4 03/25/2021   PLT 212 03/25/2021   NEUTROABS 3.2 03/25/2021    ASSESSMENT:  1. Stage Ia right breast cancer: -0.9 cm, ER/PR positive, status post lumpectomy in February 2016, Oncotype DX score 4. -Status post XRT completed on 06/05/2015. Anastrozole started in June 2016. -BCI testing in September 2018 showed low likelihood of benefit from extended adjuvant therapy. -She is tolerating anastrozole reasonably well. -Last mammogram was from 03/17/2020 which was BI-RADS Category 2.  2. Osteopenia: -DEXA scan from 06/15/2019 showed T score -1.2 consistent with the osteopenia. -She is receiving Prolia 26-monthinjections which was started in August 2016. She will proceed with her Prolia today. She does not have any side effects. She will continue calcium and vitamin D.   PLAN:  1. Stage Ia right breast cancer: -She has completed 5 years of anastrozole. -We reviewed mammogram from 03/22/2021 which was BI-RADS Category 1. -Physical examination today did not reveal any palpable masses. -Reviewed her labs today which showed normal chemistries and CBC. -She will follow up with Dr. FWilley Bladeand will continue with yearly mammograms.  We will be glad to see her on an as-needed basis.  2. Osteopenia: -Her vitamin D level is 31. -She will receive her last dose of Prolia today.   Breast Cancer therapy associated bone loss: I have recommended calcium, Vitamin D and weight bearing exercises.   No orders of the defined  types were placed in this encounter.  The patient has a good understanding of the overall plan. she agrees with it. she will call with any problems that may develop before the next visit here.    SDerek Jack MD [phone]  I, DMilinda [name] am acting as a scribe for Dr. SSanda Linger  I, SDerek JackMD, have reviewed the above documentation for accuracy and completeness, and I agree with the above.

## 2021-03-25 NOTE — Patient Instructions (Signed)
[place] at [facility] Discharge Instructions  You were seen today by Dr. Delton Coombes. He went over your recent results and scans. You received your final Prolia injection today. Dr. Willey Blade will take over the care of your breast cancer and order annual mammograms. Continue taking calcium and vitamin D daily. Dr. Delton Coombes will see you back as needed for follow up.   Thank you for choosing [place] at [facility] to provide your oncology and hematology care.  To afford each patient quality time with our provider, please arrive at least 15 minutes before your scheduled appointment time.   If you have a lab appointment with the [facility] please come in thru the Main Entrance and check in at the main information desk  You need to re-schedule your appointment should you arrive 10 or more minutes late.  We strive to give you quality time with our providers, and arriving late affects you and other patients whose appointments are after yours.  Also, if you no show three or more times for appointments you may be dismissed from the clinic at the providers discretion.     Again, thank you for choosing [facility].  Our hope is that these requests will decrease the amount of time that you wait before being seen by our physicians.       _____________________________________________________________  Should you have questions after your visit to [facility], please contact our office at [phone] between the hours of 8:00 a.m. and 4:30 p.m.  Voicemails left after 4:00 p.m. will not be returned until the following business day.  For prescription refill requests, have your pharmacy contact our office and allow 72 hours.    Cancer Center Support Programs:   > Cancer Support Group  2nd Tuesday of the month 1pm-2pm, Journey Room

## 2021-03-26 ENCOUNTER — Encounter: Payer: Self-pay | Admitting: Allergy & Immunology

## 2021-03-29 DIAGNOSIS — E0500 Thyrotoxicosis with diffuse goiter without thyrotoxic crisis or storm: Secondary | ICD-10-CM | POA: Diagnosis not present

## 2021-03-29 DIAGNOSIS — E785 Hyperlipidemia, unspecified: Secondary | ICD-10-CM | POA: Diagnosis not present

## 2021-03-29 DIAGNOSIS — I491 Atrial premature depolarization: Secondary | ICD-10-CM | POA: Diagnosis not present

## 2021-03-29 DIAGNOSIS — Z853 Personal history of malignant neoplasm of breast: Secondary | ICD-10-CM | POA: Diagnosis not present

## 2021-03-29 DIAGNOSIS — I10 Essential (primary) hypertension: Secondary | ICD-10-CM | POA: Diagnosis not present

## 2021-04-07 NOTE — Patient Instructions (Signed)
Thyroid-Stimulating Hormone Test Why am I having this test? The thyroid is a gland in the lower front of the neck. It makes hormones that affect many body parts and systems, including the system that affects how quickly the body burns fuel for energy (metabolism). The pituitary gland is located just below the brain, behind the eyes and nasal passages. It helps maintain thyroid hormone levels and thyroid gland function. You may have a thyroid-stimulating hormone (TSH) test if you have possible symptoms of abnormal thyroid hormone levels. This test can help your health care provider:  Diagnose a disorder of the thyroid gland or pituitary gland.  Manage your condition and treatment if you have an underactive thyroid (hypothyroidism) or an overactive thyroid (hyperthyroidism). Newborn babies may have this test done to screen for hypothyroidism that is present at birth (congenital). What is being tested? This test measures the amount of TSH in your blood. TSH may also be called thyrotropin. When the thyroid does not make enough hormones, the pituitary gland releases TSH into the bloodstream to stimulate the thyroid gland to make more hormones. What kind of sample is taken? A blood sample is required for this test. It is usually collected by inserting a needle into a blood vessel. For newborns, a small amount of blood may be collected from the umbilical cord, or by using a small needle to prick the baby's heel (heel stick).      Tell a health care provider about:  All medicines you are taking, including vitamins, herbs, eye drops, creams, and over-the-counter medicines.  Any blood disorders you have.  Any surgeries you have had.  Any medical conditions you have.  Whether you are pregnant or may be pregnant. How are the results reported? Your test results will be reported as a value that indicates how much TSH is in your blood. Your health care provider will compare your results to normal ranges  that were established after testing a large group of people (reference ranges). Reference ranges may vary among labs and hospitals. For this test, common reference ranges are:  Adult: 2-10 microunits/mL or 2-10 milliunits/L.  Newborn: ? Heel stick: 3-18 microunits/mL or 3-18 milliunits/L. ? Umbilical cord: 2-53 microunits/mL or 3-12 milliunits/L. What do the results mean? Results that are within the reference range are considered normal. This means that you have a normal amount of TSH in your blood. Results that are higher than the reference range mean that your TSH levels are too high. This may mean:  Your thyroid gland is not making enough thyroid hormones.  Your thyroid medicine dosage is too low.  You have a tumor on your pituitary gland. This is rare. Results that are lower than the reference range mean that your TSH levels are too low. This may be caused by hyperthyroidism or by a problem with the pituitary gland function. Talk with your health care provider about what your results mean. Questions to ask your health care provider Ask your health care provider, or the department that is doing the test:  When will my results be ready?  How will I get my results?  What are my treatment options?  What other tests do I need?  What are my next steps? Summary  You may have a thyroid-stimulating hormone (TSH) test if you have possible symptoms of abnormal thyroid hormone levels.  The thyroid is a gland in the lower front of the neck. It makes hormones that affect many body parts and systems.  The pituitary gland is  located just below the brain, behind the eyes and nasal passages. It helps maintain thyroid hormone levels and thyroid gland function.  This test measures the amount of TSH in your blood. TSH is made by the pituitary gland. It may also be called thyrotropin. This information is not intended to replace advice given to you by your health care provider. Make sure you  discuss any questions you have with your health care provider. Document Revised: 08/27/2020 Document Reviewed: 08/27/2020 Elsevier Patient Education  2021 [employer].

## 2021-04-08 ENCOUNTER — Other Ambulatory Visit: Payer: Self-pay

## 2021-04-08 ENCOUNTER — Ambulatory Visit: Payer: Medicare Other | Admitting: Nurse Practitioner

## 2021-04-08 ENCOUNTER — Encounter: Payer: Self-pay | Admitting: Nurse Practitioner

## 2021-04-08 VITALS — BP 156/70 | HR 71 | Ht 63.0 in | Wt 220.0 lb

## 2021-04-08 DIAGNOSIS — R7989 Other specified abnormal findings of blood chemistry: Secondary | ICD-10-CM | POA: Diagnosis not present

## 2021-04-08 NOTE — Progress Notes (Signed)
04/08/2021     Endocrinology Consult Note    Subjective:    Patient ID: Crystal [name], female    DOB: 1941-02-11, PCP Asencion Noble, MD.   Past Medical History:  Diagnosis Date  . Breast cancer ([place]) 03/21/15   right  . Chronic fatigue   . Colitis, ischemic ([place]) 08/2007, 05/2011   Last colonoscopy/bx by Dr Rourk->(08/31/07) descending colon  . Depression   . Diverticulosis   . Enlarged thyroid   . Gait abnormality 10/18/2017  . GERD (gastroesophageal reflux disease)   . HTN (hypertension)   . Lumbar spinal stenosis 11/07/2017   Severe at L4-5, moderate at L3-4  . 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 OA (osteoarthritis)   . Obesity   . PONV (postoperative nausea and vomiting)   . Ptosis of eyelid, left   . Sleep apnea    cpap  . Spinal stenosis   . Tubular adenoma of colon 08/2007    Past Surgical History:  Procedure Laterality Date  . ABDOMINAL HYSTERECTOMY     partial  . BREAST CYST EXCISION     benign, left x2, right x1  . CATARACT EXTRACTION W/PHACO  12/01/2011   Procedure: CATARACT EXTRACTION PHACO AND INTRAOCULAR LENS PLACEMENT (IOC);  Surgeon: Tonny Branch;  Location: [facility];  Service: Ophthalmology;  Laterality: Right;  CDE=14.87  . CATARACT EXTRACTION W/PHACO  02/06/2012   Procedure: CATARACT EXTRACTION PHACO AND INTRAOCULAR LENS PLACEMENT (IOC);  Surgeon: Tonny Branch, MD;  Location: [facility];  Service: Ophthalmology;  Laterality: Left;  CDE 13.00  . COLONOSCOPY  Sept 2008   Last colonoscopy/bx by Dr Rourk->(08/31/07) descending colon TUBULAR ADENOMA  . COLONOSCOPY  10/25/2012   Procedure: COLONOSCOPY;  Surgeon: Daneil Dolin, MD;  [facility];  Service: Endoscopy;  Laterality: N/A;  10:15-changed to 10:30 Darius Bump notified  . PARTIAL MASTECTOMY WITH NEEDLE LOCALIZATION AND AXILLARY SENTINEL LYMPH NODE BX Right 03/18/2015   Procedure: PARTIAL MASTECTOMY AFTER NEEDLE LOCALIZATION AND AXILLARY SENTINEL LYMPH NODE BX;  Surgeon: Aviva Signs Md, MD;  Location:  [facility];  Service: General;  Laterality: Right;  . PTOSIS REPAIR Left 05/11/2017   Procedure: INTERNAL PTOSIS REPAIR OF LEFT EYELID;  Surgeon: Clista Bernhardt, MD;  Location: [facility];  Service: Ophthalmology;  Laterality: Left;  . TUBAL LIGATION  1970   hysterectomy    Social History   Socioeconomic History  . Marital status: Widowed    Spouse name: Not on file  . Number of children: 3  . Years of education: 35  . Highest education level: Not on file  Occupational History  . Occupation: Lab Tech-retired  Tobacco Use  . Smoking status: Never Smoker  . Smokeless tobacco: Never Used  Vaping Use  . Vaping Use: Never used  Substance and Sexual Activity  . Alcohol use: No  . Drug use: No  . Sexual activity: Not Currently    Birth control/protection: Surgical  Other Topics Concern  . Not on file  Social History Narrative   Lives alone   Caffeine use: less than 8 oz per week   Right handed    Social Determinants of Health   Financial Resource Strain: Not on file  Food Insecurity: Not on file  Transportation Needs: Not on file  Physical Activity: Not on file  Stress: Not on file  Social Connections: Not on file    Family History  Problem Relation Age of Onset  . Cancer Brother 26  . Coronary artery disease  Brother   . Diabetes Sister   . Heart disease Father   . Heart disease Brother   . Anesthesia problems Neg Hx   . Hypotension Neg Hx   . Pseudochol deficiency Neg Hx   . Malignant hyperthermia Neg Hx   . Colon cancer Neg Hx     Outpatient Encounter Medications as of 04/08/2021  Medication Sig  . acetaminophen (TYLENOL) 650 MG CR tablet Take 1,300 mg by mouth 2 (two) times daily.  Marland Kitchen amLODipine (NORVASC) 10 MG tablet Take 10 mg by mouth daily.  . Artificial Tear Solution (SOOTHE XP OP) Place 1 drop into both eyes 2 (two) times daily.  Marland Kitchen buPROPion (WELLBUTRIN SR) 150 MG 12 hr tablet Take 150 mg by mouth 2 (two) times daily.  . Calcium-Vitamin D (CALTRATE 600  PLUS-VIT D PO) Take 1 tablet by mouth daily.  . chlorthalidone (HYGROTON) 25 MG tablet Take 25 mg by mouth every morning.  . docusate sodium (COLACE) 100 MG capsule Take 100 mg by mouth 2 (two) times daily.  Marland Kitchen FLUoxetine (PROZAC) 20 MG capsule Take 20 mg by mouth daily.  Marland Kitchen ipratropium (ATROVENT) 0.03 % nasal spray Place 2 sprays into both nostrils 3 (three) times daily.  . metoprolol succinate (TOPROL-XL) 25 MG 24 hr tablet Take 25 mg by mouth daily.  . montelukast (SINGULAIR) 10 MG tablet Take 1 tablet (10 mg total) by mouth at bedtime.  . pantoprazole (PROTONIX) 40 MG tablet TAKE 1 TABLET BY MOUTH TWICE DAILY (Patient taking differently: 40 mg 2 (two) times daily.)  . simvastatin (ZOCOR) 10 MG tablet Take 10 mg by mouth at bedtime.   . SYMBICORT 160-4.5 MCG/ACT inhaler INHALE 2 PUFFS INTO THE LUNGS IN THE MORNING AND AT BEDTIME  . fluticasone (FLONASE) 50 MCG/ACT nasal spray Place 2 sprays into both nostrils daily.   Marland Kitchen levocetirizine (XYZAL) 5 MG tablet Take 1 tablet (5 mg total) by mouth every evening. (Patient not taking: Reported on 04/08/2021)   No facility-administered encounter medications on file as of 04/08/2021.    ALLERGIES: Allergies  Allergen Reactions  . Bactrim [Sulfamethoxazole-Trimethoprim] Nausea Only    VACCINATION STATUS: Immunization History  Administered Date(s) Administered  . Influenza, High Dose Seasonal PF 10/23/2018  . Influenza-Unspecified 09/10/2015  . Moderna Sars-Covid-2 Vaccination 02/19/2020, 03/18/2020, 12/10/2020  . Pneumococcal Conjugate-13 12/25/2014     HPI  Crystal [name] is 80 y.o. female who presents today with a medical history as above. she is being seen in consultation for hyperthyroidism requested by Asencion Noble, MD.  she has been dealing with symptoms of chronic cough, intermittent trouble sleeping for month/years. These symptoms are progressively worsening and troubling to her.  her most recent thyroid labs revealed suppressed TSH of  0.098 on 03/22/21.  She reports trouble breathing when she goes to the dentist and lays back with mouth open.  She gets her thyroid labs done every year and this is the first time anything was abnormal. she denies dysphagia, choking, shortness of breath, no recent voice change.    she reports family history of thyroid dysfunction in her mother with significant goiter, but denies family hx of thyroid cancer. she denies personal history of goiter. she is not on any anti-thyroid medications nor on any thyroid hormone supplements. Denies use of Biotin containing supplements.  she is willing to proceed with appropriate work up and therapy for thyrotoxicosis.   Review of systems  Constitutional: + Minimally fluctuating body weight, current Body mass index is 38.97 kg/m., no  fatigue, no subjective hyperthermia, no subjective hypothermia Eyes: no blurry vision, no xerophthalmia ENT: no sore throat, no nodules palpated in throat, no dysphagia/odynophagia, no hoarseness Cardiovascular: no chest pain, shortness of breath with exertion, no palpitations, no leg swelling Respiratory:  Chronic cough, no shortness of breath Gastrointestinal: no nausea/vomiting/diarrhea Musculoskeletal: no muscle/joint aches, walks with walker due to deconditioning Skin: no rashes, no hyperemia Neurological: no tremors, no numbness, no tingling, no dizziness Psychiatric: no depression, no anxiety   Objective:    BP (!) 156/70 (BP Location: Right Arm, Patient Position: Sitting)   Pulse 71   Ht 5\' 3"  (1.6 m)   Wt 220 lb (99.8 kg)   BMI 38.97 kg/m   Wt Readings from Last 3 Encounters:  04/08/21 220 lb (99.8 kg)  03/25/21 217 lb 13 oz (98.8 kg)  03/24/21 217 lb 9.6 oz (98.7 kg)     BP Readings from Last 3 Encounters:  04/08/21 (!) 156/70  03/25/21 (!) 161/53  03/25/21 (!) 161/53                          Physical Exam- Limited  Constitutional:  Body mass index is 38.97 kg/m. , not in acute distress, normal  state of mind Eyes:  EOMI, no exophthalmos Neck: Supple Thyroid: + gross goiter Cardiovascular: RRR, + murmur, rubs, or gallops, no edema Respiratory: Adequate breathing efforts, no crackles, rales, rhonchi, or wheezing Musculoskeletal: no gross deformities, strength intact in all four extremities, no gross restriction of joint movements, walks with walker Skin:  no rashes, no hyperemia Neurological: mild tremor with outstretched hands   CMP     Component Value Date/Time   NA 139 03/25/2021 1015   K 3.4 (L) 03/25/2021 1015   CL 100 03/25/2021 1015   CO2 29 03/25/2021 1015   GLUCOSE 92 03/25/2021 1015   BUN 20 03/25/2021 1015   CREATININE 0.90 03/25/2021 1015   CALCIUM 9.6 03/25/2021 1015   PROT 7.1 03/25/2021 1015   ALBUMIN 4.0 03/25/2021 1015   AST 25 03/25/2021 1015   ALT 20 03/25/2021 1015   ALKPHOS 57 03/25/2021 1015   BILITOT 0.7 03/25/2021 1015   GFRNONAA >60 03/25/2021 1015   GFRAA >60 08/27/2020 1025     CBC    Component Value Date/Time   WBC 5.4 03/25/2021 1015   RBC 4.20 03/25/2021 1015   HGB 13.3 03/25/2021 1015   HCT 40.5 03/25/2021 1015   PLT 212 03/25/2021 1015   MCV 96.4 03/25/2021 1015   MCH 31.7 03/25/2021 1015   MCHC 32.8 03/25/2021 1015   RDW 12.9 03/25/2021 1015   LYMPHSABS 1.5 03/25/2021 1015   MONOABS 0.5 03/25/2021 1015   EOSABS 0.2 03/25/2021 1015   BASOSABS 0.0 03/25/2021 1015     Diabetic Labs (most recent): No results found for: HGBA1C  Lipid Panel  No results found for: CHOL, TRIG, HDL, CHOLHDL, VLDL, LDLCALC, LDLDIRECT, LABVLDL   Lab Results  Component Value Date   TSH 0.10 (A) 03/22/2021   TSH 1.31 03/19/2020       Thyroid Ultrasound from 02/22/21 CLINICAL DATA:  Palpable abnormality.  Palpable thyroid nodule.   EXAM: THYROID ULTRASOUND   TECHNIQUE: Ultrasound examination of the thyroid gland and adjacent soft tissues was performed.   COMPARISON:  Chest CT-07/20/2020   FINDINGS: Parenchymal Echotexture:  Markedly heterogenous   Isthmus: Enlarged measuring 1.1 cm in diameter   Right lobe: Enlarged measuring 8.6 x 3.4 x 3.6 cm   Left  lobe: Enlarged measuring 6.1 x 3.9 x 3.6 cm   _________________________________________________________   Estimated total number of nodules >/= 1 cm: 1   Number of spongiform nodules >/=  2 cm not described below (TR1): 0   Number of mixed cystic and solid nodules >/= 1.5 cm not described below (TR2): 0   _________________________________________________________   Questioned 3.9 x 3.8 x 3.2 cm isoechoic ill-defined nodule/mass within the left lobe of the thyroid is favored to represent a pseudonodule as it lacks defined borders on both provided transverse and sagittal images, likely accentuated due to marked heterogeneity of the thyroid parenchymal echotexture.   IMPRESSION: Enlarged and markedly heterogeneous thyroid without discrete worrisome nodule or mass.   The above is in keeping with the ACR TI-RADS recommendations - J Am Coll Radiol 2017;14:587-595.     Electronically Signed   By: Sandi Mariscal M.D.   On: 02/22/2021 11:47    Assessment & Plan:   1. Abnormal TSH  she is being seen at a kind request of Asencion Noble, MD.  her history and most recent labs are reviewed, and she was examined clinically. Subjective and objective findings are consistent with thyrotoxicosis likely from primary hyperthyroidism. [place], more information is needed to confirm this suspicion. The potential risks of untreated thyrotoxicosis and the need for definitive therapy have been discussed in detail with her, and she agrees to proceed with diagnostic workup and treatment plan.   I will repeat full profile thyroid function tests today including antibody testing for autoimmune thyroid dysfunction.  If labs are consistent with hyperthyroidism, will proceed with uptake and scan for definitive confirmation.   Options of therapy are discussed with her.  We discussed  the option of treating it with medications including methimazole or PTU which may have side effects including rash, transaminitis, and bone marrow suppression.  We also discussed the option of definitive therapy with RAI ablation of the thyroid. If she is found to have primary hyperthyroidism from Graves' disease , toxic multinodular goiter or toxic nodular goiter the preferred modality of treatment would be I-131 thyroid ablation. Surgery is another choice of treatment in some cases, in her case surgery is not a good fit due to her age.  -Patient is made aware of the high likelihood of post ablative hypothyroidism with subsequent need for lifelong thyroid hormone replacement. she understands this outcome and she is willing to proceed.      she will return in 1 week for treatment decision.   I will did not initiate any new prescriptions today.    -Patient is advised to maintain close follow up with Asencion Noble, MD for primary care needs.   - Time spent with the patient: 45 minutes, of which >50% was spent in obtaining information about her symptoms, reviewing her previous labs, evaluations, and treatments, counseling her about her hyperthyroidism , and developing a plan to confirm the diagnosis and long term treatment as necessary. Please refer to "Patient Self Inventory" in the Media tab for reviewed elements of pertinent patient history.  Crystal [name] participated in the discussions, expressed understanding, and voiced agreement with the above plans.  All questions were answered to her satisfaction. she is encouraged to contact clinic should she have any questions or concerns prior to her return visit.   Follow up plan: Return in about 1 week (around 04/15/2021) for Thyroid follow up, Previsit labs.   Thank you for involving me in the care of this pleasant patient, and I will continue  to update you with her progress.  Rayetta Pigg, [facility] [address] [place], [place] [address] Phone: [phone] Fax: [phone]  04/08/2021, 2:17 PM

## 2021-04-09 LAB — THYROID PEROXIDASE ANTIBODY: Thyroperoxidase Ab SerPl-aCnc: <8 IU/mL (ref 0–34)

## 2021-04-09 LAB — TSH: TSH: 0.045 u[IU]/mL — ABNORMAL LOW (ref 0.450–4.500)

## 2021-04-09 LAB — THYROGLOBULIN ANTIBODY: Thyroglobulin Antibody: <1 IU/mL (ref 0.0–0.9)

## 2021-04-09 LAB — T3, FREE: T3, Free: 3.5 pg/mL (ref 2.0–4.4)

## 2021-04-09 LAB — T4, FREE: Free T4: 1.72 ng/dL (ref 0.82–1.77)

## 2021-04-12 NOTE — Progress Notes (Signed)
History of Present Illness: Crystal [name] is a 80 y.o. year old female presenting for E/M of gross hematuria--recently seen in ER for this.  She grew E. coli, was treated appropriately for this, and since then her gross hematuria has resolved.  She was found to have a right renal cyst in the lower pole.  This was present, but about half the size, on a CT scan performed in 2020.  She has had no flank pain.  She denies any prior kidney issues.  CT stone protocol--IMPRESSION: 1. 3.5 x 3 x 5.5 cm cystic structure within the LOWER RIGHT kidney, with possible complexity. Given hematuria, recommend elective MRI with and without contrast if clinically able. 2. Enlarging large hiatal hernia. 3. Slightly increased density within the dependent gallbladder may represent sludge or cholelithiasis. No CT evidence of acute cholecystitis. 4. Aortic Atherosclerosis (ICD10-I70.0).  Past Medical History:  Diagnosis Date  . Breast cancer ([place]) 03/21/15   right  . Chronic fatigue   . Colitis, ischemic ([place]) 08/2007, 05/2011   Last colonoscopy/bx by Dr Rourk->(08/31/07) descending colon  . Depression   . Diverticulosis   . Enlarged thyroid   . Gait abnormality 10/18/2017  . GERD (gastroesophageal reflux disease)   . HTN (hypertension)   . Lumbar spinal stenosis 11/07/2017   Severe at L4-5, moderate at L3-4  . 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 OA (osteoarthritis)   . Obesity   . PONV (postoperative nausea and vomiting)   . Ptosis of eyelid, left   . Sleep apnea    cpap  . Spinal stenosis   . Tubular adenoma of colon 08/2007    Past Surgical History:  Procedure Laterality Date  . ABDOMINAL HYSTERECTOMY     partial  . BREAST CYST EXCISION     benign, left x2, right x1  . CATARACT EXTRACTION W/PHACO  12/01/2011   Procedure: CATARACT EXTRACTION PHACO AND INTRAOCULAR LENS PLACEMENT (IOC);  Surgeon: Tonny Branch;  [facility]: [facility];  Service: Ophthalmology;  Laterality: Right;  CDE=14.87  . CATARACT  EXTRACTION W/PHACO  02/06/2012   Procedure: CATARACT EXTRACTION PHACO AND INTRAOCULAR LENS PLACEMENT (IOC);  Surgeon: Tonny Branch, MD;  Location: [facility];  Service: Ophthalmology;  Laterality: Left;  CDE 13.00  . COLONOSCOPY  Sept 2008   Last colonoscopy/bx by Dr Rourk->(08/31/07) descending colon TUBULAR ADENOMA  . COLONOSCOPY  10/25/2012   Procedure: COLONOSCOPY;  Surgeon: Daneil Dolin, MD;  [facility];  Service: Endoscopy;  Laterality: N/A;  10:15-changed to 10:30 Darius Bump notified  . PARTIAL MASTECTOMY WITH NEEDLE LOCALIZATION AND AXILLARY SENTINEL LYMPH NODE BX Right 03/18/2015   Procedure: PARTIAL MASTECTOMY AFTER NEEDLE LOCALIZATION AND AXILLARY SENTINEL LYMPH NODE BX;  Surgeon: Aviva Signs Md, MD;  Location: [facility];  Service: General;  Laterality: Right;  . PTOSIS REPAIR Left 05/11/2017   Procedure: INTERNAL PTOSIS REPAIR OF LEFT EYELID;  Surgeon: Clista Bernhardt, MD;  Location: [facility];  Service: Ophthalmology;  Laterality: Left;  . TUBAL LIGATION  1970   hysterectomy    Home Medications:  (Not in a hospital admission)   Allergies:  Allergies  Allergen Reactions  . Bactrim [Sulfamethoxazole-Trimethoprim] Nausea Only    Family History  Problem Relation Age of Onset  . Cancer Brother 36  . Coronary artery disease Brother   . Diabetes Sister   . Heart disease Father   . Heart disease Brother   . Anesthesia problems Neg Hx   . Hypotension Neg Hx   . Pseudochol deficiency Neg  Hx   . Malignant hyperthermia Neg Hx   . Colon cancer Neg Hx     Social History:  reports that she has never smoked. She has never used smokeless tobacco. She reports that she does not drink alcohol and does not use drugs.  ROS: A complete review of systems was performed.  All systems are negative except for pertinent findings as noted.  Physical Exam:  Vital signs in last 24 hours: @VSRANGES @ General:  Alert and oriented, No acute distress HEENT: Normocephalic, atraumatic Neck: No  JVD or lymphadenopathy Cardiovascular: Regular rate  Lungs: Normal inspiratory/expiratory excursion  Extremities: No edema Neurologic: Grossly intact  I have reviewed prior pt notes  I have reviewed notes from referring/previous physicians  I have reviewed urinalysis results--today urinalysis is clear  I have independently reviewed prior imaging--I reviewed prior CT scans from 2022 and 2020 with the patient and her daughter  I have reviewed prior urine culture  Impression/Assessment:  1.  Cystitis with hematuria, resolved-sporadic  2.  Slightly complex renal cyst on the right, with growth since 2020, suggest further evaluation  Plan:  1.  Patient and her daughter were reassured about the hematuria and that this is quite common with cystitis  2.  I will order renal ultrasound to evaluate her cyst.  If significant issue after that consider MRI  3.  Follow-up dependent on the above results  Jorja Loa 04/12/2021, 8:35 PM  Lillette Boxer. Brysten Reister MD

## 2021-04-13 ENCOUNTER — Ambulatory Visit (INDEPENDENT_AMBULATORY_CARE_PROVIDER_SITE_OTHER): Payer: Medicare Other | Admitting: Urology

## 2021-04-13 ENCOUNTER — Encounter: Payer: Self-pay | Admitting: Urology

## 2021-04-13 ENCOUNTER — Other Ambulatory Visit: Payer: Self-pay

## 2021-04-13 VITALS — BP 171/73 | HR 61 | Temp 98.8°F | Ht 63.0 in | Wt 220.0 lb

## 2021-04-13 DIAGNOSIS — N3091 Cystitis, unspecified with hematuria: Secondary | ICD-10-CM

## 2021-04-13 DIAGNOSIS — N390 Urinary tract infection, site not specified: Secondary | ICD-10-CM | POA: Diagnosis not present

## 2021-04-13 DIAGNOSIS — N281 Cyst of kidney, acquired: Secondary | ICD-10-CM

## 2021-04-13 LAB — URINALYSIS, ROUTINE W REFLEX MICROSCOPIC
Bilirubin, UA: NEGATIVE
Glucose, UA: NEGATIVE
Ketones, UA: NEGATIVE
Leukocytes,UA: NEGATIVE
Nitrite, UA: NEGATIVE
Protein,UA: NEGATIVE
Specific Gravity, UA: 1.015 (ref 1.005–1.030)
Urobilinogen, Ur: 0.2 mg/dL (ref 0.2–1.0)
pH, UA: 6.5 (ref 5.0–7.5)

## 2021-04-13 LAB — MICROSCOPIC EXAMINATION: RBC, Urine: NONE SEEN /hpf (ref 0–2)

## 2021-04-13 NOTE — Progress Notes (Signed)
Urological Symptom Review  Patient is experiencing the following symptoms: Frequent urination Get up at night to urinate   Review of Systems  Gastrointestinal (upper)  : Indigestion/heartburn  Gastrointestinal (lower) : Constipation  Constitutional : Negative for symptoms  Skin: Negative for skin symptoms  Eyes: Negative for eye symptoms  Ear/Nose/Throat : Negative for Ear/Nose/Throat symptoms  Hematologic/Lymphatic: Negative for Hematologic/Lymphatic symptoms  Cardiovascular : Negative for cardiovascular symptoms  Respiratory : Cough  Endocrine: Excessive thirst  Musculoskeletal: Back pain Joint pain  Neurological: Negative for neurological symptoms  Psychologic: Negative for psychiatric symptoms

## 2021-04-14 NOTE — Patient Instructions (Signed)

## 2021-04-15 ENCOUNTER — Telehealth (INDEPENDENT_AMBULATORY_CARE_PROVIDER_SITE_OTHER): Payer: Medicare Other | Admitting: Nurse Practitioner

## 2021-04-15 ENCOUNTER — Encounter: Payer: Self-pay | Admitting: Nurse Practitioner

## 2021-04-15 ENCOUNTER — Other Ambulatory Visit: Payer: Self-pay

## 2021-04-15 DIAGNOSIS — R7989 Other specified abnormal findings of blood chemistry: Secondary | ICD-10-CM

## 2021-04-15 DIAGNOSIS — E0590 Thyrotoxicosis, unspecified without thyrotoxic crisis or storm: Secondary | ICD-10-CM

## 2021-04-15 NOTE — Progress Notes (Signed)
04/15/2021     Endocrinology Follow Up Note     TELEHEALTH VISIT: The patient is being engaged in telehealth visit due to COVID-19.  This type of visit limits physical examination significantly, and thus is not preferable over face-to-face encounters.  I connected with  Crystal [name] on 04/15/21 by a video enabled telemedicine application and verified that I am speaking with the correct person using two identifiers.   I discussed the limitations of evaluation and management by telemedicine. The patient expressed understanding and agreed to proceed.   The participants involved in this visit include: Brita Romp, NP located at [facility] and Crystal [name]  located at their personal residence listed.   Subjective:    Patient ID: Crystal [name], female    DOB: Dec 20, 1941, PCP Asencion Noble, MD.   Past Medical History:  Diagnosis Date  . Anxiety   . Arthritis   . Breast cancer ([place]) 03/21/15   right  . Chronic fatigue   . Colitis, ischemic ([place]) 08/2007, 05/2011   Last colonoscopy/bx by Dr Rourk->(08/31/07) descending colon  . Depression   . Diverticulosis   . Enlarged thyroid   . Gait abnormality 10/18/2017  . GERD (gastroesophageal reflux disease)   . Gout   . HTN (hypertension)   . Hypercholesteremia   . Lumbar spinal stenosis 11/07/2017   Severe at L4-5, moderate at L3-4  . 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 OA (osteoarthritis)   . Obesity   . PONV (postoperative nausea and vomiting)   . Ptosis of eyelid, left   . Sleep apnea    cpap  . Spinal stenosis   . Tubular adenoma of colon 08/2007    Past Surgical History:  Procedure Laterality Date  . ABDOMINAL HYSTERECTOMY     partial  . BREAST CYST EXCISION     benign, left x2, right x1  . CATARACT EXTRACTION W/PHACO  12/01/2011   Procedure: CATARACT EXTRACTION PHACO AND INTRAOCULAR LENS PLACEMENT (IOC);  Surgeon: Tonny Branch;  Location: [facility];  Service: Ophthalmology;  Laterality: Right;   CDE=14.87  . CATARACT EXTRACTION W/PHACO  02/06/2012   Procedure: CATARACT EXTRACTION PHACO AND INTRAOCULAR LENS PLACEMENT (IOC);  Surgeon: Tonny Branch, MD;  Location: [facility];  Service: Ophthalmology;  Laterality: Left;  CDE 13.00  . COLONOSCOPY  Sept 2008   Last colonoscopy/bx by Dr Rourk->(08/31/07) descending colon TUBULAR ADENOMA  . COLONOSCOPY  10/25/2012   Procedure: COLONOSCOPY;  Surgeon: Daneil Dolin, MD;  Location: [facility];  Service: Endoscopy;  Laterality: N/A;  10:15-changed to 10:30 Darius Bump notified  . PARTIAL MASTECTOMY WITH NEEDLE LOCALIZATION AND AXILLARY SENTINEL LYMPH NODE BX Right 03/18/2015   Procedure: PARTIAL MASTECTOMY AFTER NEEDLE LOCALIZATION AND AXILLARY SENTINEL LYMPH NODE BX;  Surgeon: Aviva Signs Md, MD;  Location: [facility];  Service: General;  Laterality: Right;  . PTOSIS REPAIR Left 05/11/2017   Procedure: INTERNAL PTOSIS REPAIR OF LEFT EYELID;  Surgeon: Clista Bernhardt, MD;  Location: [place];  Service: Ophthalmology;  Laterality: Left;  . TUBAL LIGATION  1970   hysterectomy  . TUBAL LIGATION      Social History   Socioeconomic History  . Marital status: Widowed    Spouse name: Not on file  . Number of children: 3  . Years of education: 30  . Highest education level: Not on file  Occupational History  . Occupation: Lab Tech-retired  Tobacco Use  . Smoking status: Never Smoker  . Smokeless tobacco:  Never Used  Vaping Use  . Vaping Use: Never used  Substance and Sexual Activity  . Alcohol use: No  . Drug use: No  . Sexual activity: Not Currently    Birth control/protection: Surgical  Other Topics Concern  . Not on file  Social History Narrative   Lives alone   Caffeine use: less than 8 oz per week   Right handed    Social Determinants of Health   Financial Resource Strain: Not on file  Food Insecurity: Not on file  Transportation Needs: Not on file  Physical Activity: Not on file  Stress: Not on file  Social Connections: Not on file     Family History  Problem Relation Age of Onset  . Cancer Brother 68  . Coronary artery disease Brother   . Diabetes Sister   . Heart disease Father   . Heart disease Brother   . Anesthesia problems Neg Hx   . Hypotension Neg Hx   . Pseudochol deficiency Neg Hx   . Malignant hyperthermia Neg Hx   . Colon cancer Neg Hx     Outpatient Encounter Medications as of 04/15/2021  Medication Sig  . acetaminophen (TYLENOL) 650 MG CR tablet Take 1,300 mg by mouth 2 (two) times daily.  Marland Kitchen amLODipine (NORVASC) 10 MG tablet Take 10 mg by mouth daily.  . Artificial Tear Solution (SOOTHE XP OP) Place 1 drop into both eyes 2 (two) times daily.  Marland Kitchen buPROPion (WELLBUTRIN SR) 150 MG 12 hr tablet Take 150 mg by mouth 2 (two) times daily.  . Calcium-Vitamin D (CALTRATE 600 PLUS-VIT D PO) Take 1 tablet by mouth daily.  . chlorthalidone (HYGROTON) 25 MG tablet Take 25 mg by mouth every morning.  . docusate sodium (COLACE) 100 MG capsule Take 100 mg by mouth 2 (two) times daily.  Marland Kitchen FLUoxetine (PROZAC) 20 MG capsule Take 20 mg by mouth daily.  Marland Kitchen ipratropium (ATROVENT) 0.03 % nasal spray Place 2 sprays into both nostrils 3 (three) times daily.  . metoprolol succinate (TOPROL-XL) 25 MG 24 hr tablet Take 25 mg by mouth daily.  . montelukast (SINGULAIR) 10 MG tablet Take 1 tablet (10 mg total) by mouth at bedtime.  . pantoprazole (PROTONIX) 40 MG tablet TAKE 1 TABLET BY MOUTH TWICE DAILY (Patient taking differently: 40 mg 2 (two) times daily.)  . simvastatin (ZOCOR) 10 MG tablet Take 10 mg by mouth at bedtime.   . SYMBICORT 160-4.5 MCG/ACT inhaler INHALE 2 PUFFS INTO THE LUNGS IN THE MORNING AND AT BEDTIME   No facility-administered encounter medications on file as of 04/15/2021.    ALLERGIES: Allergies  Allergen Reactions  . Bactrim [Sulfamethoxazole-Trimethoprim] Nausea Only    VACCINATION STATUS: Immunization History  Administered Date(s) Administered  . Influenza, High Dose Seasonal PF 10/23/2018   . Influenza-Unspecified 09/10/2015  . Moderna Sars-Covid-2 Vaccination 02/19/2020, 03/18/2020, 12/10/2020  . Pneumococcal Conjugate-13 12/25/2014     HPI  Crystal [name] is 80 y.o. female who presents today with a medical history as above. she is being seen in follow up after being seen in consultation for hyperthyroidism requested by Asencion Noble, MD.  she has been dealing with symptoms of chronic cough, intermittent trouble sleeping for month/years. These symptoms are progressively worsening and troubling to her.  her most recent thyroid labs revealed suppressed TSH of 0.098 on 03/22/21.  She reports trouble breathing when she goes to the dentist and lays back with mouth open.  She gets her thyroid labs done every year and  this is the first time anything was abnormal. she denies dysphagia, choking, shortness of breath, no recent voice change.    she reports family history of thyroid dysfunction in her mother with significant goiter, but denies family hx of thyroid cancer. she denies personal history of goiter. she is not on any anti-thyroid medications nor on any thyroid hormone supplements. Denies use of Biotin containing supplements.  she is willing to proceed with appropriate work up and therapy for thyrotoxicosis.   Review of systems  Constitutional: + Minimally fluctuating body weight, current There is no height or weight on file to calculate BMI., no fatigue, no subjective hyperthermia, no subjective hypothermia Eyes: no blurry vision, no xerophthalmia ENT: no sore throat, no nodules palpated in throat, no dysphagia/odynophagia, no hoarseness Cardiovascular: no chest pain, shortness of breath with exertion, no palpitations, no leg swelling Respiratory:  Chronic cough- improved since starting inhalation treatments, no shortness of breath Gastrointestinal: no nausea/vomiting/diarrhea Musculoskeletal: no muscle/joint aches, walks with walker due to deconditioning Skin: no rashes, no  hyperemia Neurological: no tremors, no numbness, no tingling, no dizziness Psychiatric: no depression, no anxiety   Objective:    There were no vitals taken for this visit.  Wt Readings from Last 3 Encounters:  04/13/21 220 lb (99.8 kg)  04/08/21 220 lb (99.8 kg)  03/25/21 217 lb 13 oz (98.8 kg)     BP Readings from Last 3 Encounters:  04/13/21 (!) 171/73  04/08/21 (!) 156/70  03/25/21 (!) 161/53          Physical Exam- Telehealth- significantly limited due to nature of visit  Constitutional: There is no height or weight on file to calculate BMI. , not in acute distress, normal state of mind Respiratory: Adequate breathing efforts   CMP     Component Value Date/Time   NA 139 03/25/2021 1015   K 3.4 (L) 03/25/2021 1015   CL 100 03/25/2021 1015   CO2 29 03/25/2021 1015   GLUCOSE 92 03/25/2021 1015   BUN 20 03/25/2021 1015   CREATININE 0.90 03/25/2021 1015   CALCIUM 9.6 03/25/2021 1015   PROT 7.1 03/25/2021 1015   ALBUMIN 4.0 03/25/2021 1015   AST 25 03/25/2021 1015   ALT 20 03/25/2021 1015   ALKPHOS 57 03/25/2021 1015   BILITOT 0.7 03/25/2021 1015   GFRNONAA >60 03/25/2021 1015   GFRAA >60 08/27/2020 1025     CBC    Component Value Date/Time   WBC 5.4 03/25/2021 1015   RBC 4.20 03/25/2021 1015   HGB 13.3 03/25/2021 1015   HCT 40.5 03/25/2021 1015   PLT 212 03/25/2021 1015   MCV 96.4 03/25/2021 1015   MCH 31.7 03/25/2021 1015   MCHC 32.8 03/25/2021 1015   RDW 12.9 03/25/2021 1015   LYMPHSABS 1.5 03/25/2021 1015   MONOABS 0.5 03/25/2021 1015   EOSABS 0.2 03/25/2021 1015   BASOSABS 0.0 03/25/2021 1015     Diabetic Labs (most recent): No results found for: HGBA1C  Lipid Panel  No results found for: CHOL, TRIG, HDL, CHOLHDL, VLDL, LDLCALC, LDLDIRECT, LABVLDL   Lab Results  Component Value Date   TSH 0.045 (L) 04/08/2021   TSH 0.10 (A) 03/22/2021   TSH 1.31 03/19/2020   FREET4 1.72 04/08/2021     Results for CRETA, DORAME (MRN [id number]) as  of 04/15/2021 08:29  Ref. Range 04/08/2021 14:31  TSH Latest Ref Range: 0.450 - 4.500 uIU/mL 0.045 (L)  Triiodothyronine,Free,Serum Latest Ref Range: 2.0 - 4.4 pg/mL 3.5  T4,Free(Direct) Latest  Ref Range: 0.82 - 1.77 ng/dL 1.72  Thyroperoxidase Ab SerPl-aCnc Latest Ref Range: 0 - 34 IU/mL <8  Thyroglobulin Antibody Latest Ref Range: 0.0 - 0.9 IU/mL <1.0    Thyroid Ultrasound from 02/22/21 CLINICAL DATA:  Palpable abnormality.  Palpable thyroid nodule.   EXAM: THYROID ULTRASOUND   TECHNIQUE: Ultrasound examination of the thyroid gland and adjacent soft tissues was performed.   COMPARISON:  Chest CT-07/20/2020   FINDINGS: Parenchymal Echotexture: Markedly heterogenous   Isthmus: Enlarged measuring 1.1 cm in diameter   Right lobe: Enlarged measuring 8.6 x 3.4 x 3.6 cm   Left lobe: Enlarged measuring 6.1 x 3.9 x 3.6 cm   _________________________________________________________   Estimated total number of nodules >/= 1 cm: 1   Number of spongiform nodules >/=  2 cm not described below (TR1): 0   Number of mixed cystic and solid nodules >/= 1.5 cm not described below (TR2): 0   _________________________________________________________   Questioned 3.9 x 3.8 x 3.2 cm isoechoic ill-defined nodule/mass within the left lobe of the thyroid is favored to represent a pseudonodule as it lacks defined borders on both provided transverse and sagittal images, likely accentuated due to marked heterogeneity of the thyroid parenchymal echotexture.   IMPRESSION: Enlarged and markedly heterogeneous thyroid without discrete worrisome nodule or mass.   The above is in keeping with the ACR TI-RADS recommendations - J Am Coll Radiol 2017;14:587-595.     Electronically Signed   By: Sandi Mariscal M.D.   On: 02/22/2021 11:47    Assessment & Plan:   1. Abnormal TSH  she is being seen at a kind request of Asencion Noble, MD.  her history and most recent labs are reviewed, and she was  examined clinically. Subjective and objective findings are consistent with thyrotoxicosis likely from primary hyperthyroidism. [place], more information is needed to confirm this suspicion. The potential risks of untreated thyrotoxicosis and the need for definitive therapy have been discussed in detail with her, and she agrees to proceed with diagnostic workup and treatment plan.   Her repeat thyroid function tests show increasingly suppressed TSH of 0.045 with high normal FT4 level of 1.72 and normal FT3 level of 3.5.  Her thyroid antibody testing was negative, ruling out autoimmune thyroid disease such as Graves or Hashimoto's.  Given her lab results, I recommend pursuing the uptake and scan to confirm hyperthyroidism and she agees to proceed.   Options of therapy are discussed with her.  We discussed the option of treating it with medications including methimazole or PTU which may have side effects including rash, transaminitis, and bone marrow suppression.  We also discussed the option of definitive therapy with RAI ablation of the thyroid. If she is found to have primary hyperthyroidism from Graves' disease , toxic multinodular goiter or toxic nodular goiter the preferred modality of treatment would be I-131 thyroid ablation. Surgery is another choice of treatment in some cases, in her case surgery is not a good fit due to her age.  -Patient is made aware of the high likelihood of post ablative hypothyroidism with subsequent need for lifelong thyroid hormone replacement. she understands this outcome and she is willing to proceed.      she will return in 2 weeks for treatment decision.      -Patient is advised to maintain close follow up with Asencion Noble, MD for primary care needs.    I spent 20 minutes dedicated to the care of this patient on the date of this encounter to  include pre-visit review of records, face-to-face time with the patient, and post visit ordering of  testing.  Crystal [name] participated in the discussions, expressed understanding, and voiced agreement with the above plans.  All questions were answered to her satisfaction. she is encouraged to contact clinic should she have any questions or concerns prior to her return visit.    Follow up plan: Return in about 2 weeks (around 04/29/2021) for Thyroid follow up, Virtual visit ok; uptake and scan.   Thank you for involving me in the care of this pleasant patient, and I will continue to update you with her progress.   Rayetta Pigg, [facility] [address] [place], [place] [address] Phone: [phone] Fax: [phone]   04/15/2021, 11:35 AM

## 2021-04-20 ENCOUNTER — Other Ambulatory Visit: Payer: Self-pay

## 2021-04-20 ENCOUNTER — Ambulatory Visit (HOSPITAL_COMMUNITY)
Admission: RE | Admit: 2021-04-20 | Discharge: 2021-04-20 | Disposition: A | Discharge status: Home / Self Care | Payer: Medicare Other | Source: Ambulatory Visit | Attending: Urology | Admitting: Urology

## 2021-04-20 DIAGNOSIS — N281 Cyst of kidney, acquired: Secondary | ICD-10-CM | POA: Insufficient documentation

## 2021-04-20 DIAGNOSIS — K7689 Other specified diseases of liver: Secondary | ICD-10-CM | POA: Diagnosis not present

## 2021-04-20 DIAGNOSIS — N2889 Other specified disorders of kidney and ureter: Secondary | ICD-10-CM | POA: Diagnosis not present

## 2021-04-20 NOTE — Patient Instructions (Addendum)
1. Chronic cough - Continue to follow up with pulmonary - Continue with Symbicort two puffs twice daily as per Dr. Rolla Etienne and Ms. Pacific with albuterol as needed  2. Chronic rhinitis (cat) - Try decreasing: Atrovent (ipratropium) one spray per nostril up to [place], but this can be OVER DRYING, so be careful!   Please let us know if this treatment plan is not working well for you Schedule a follow-up appointment in 5-6 months or sooner if needed

## 2021-04-21 ENCOUNTER — Ambulatory Visit (HOSPITAL_COMMUNITY): Payer: Medicare Other

## 2021-04-21 ENCOUNTER — Encounter: Payer: Self-pay | Admitting: Family

## 2021-04-21 ENCOUNTER — Ambulatory Visit: Payer: Medicare Other | Admitting: Family

## 2021-04-21 VITALS — BP 138/68 | HR 60 | Temp 98.2°F | Resp 14 | Ht 63.0 in | Wt 220.0 lb

## 2021-04-21 DIAGNOSIS — R053 Chronic cough: Secondary | ICD-10-CM

## 2021-04-21 DIAGNOSIS — J3089 Other allergic rhinitis: Secondary | ICD-10-CM | POA: Diagnosis not present

## 2021-04-21 NOTE — Progress Notes (Signed)
[address], [address] [place] [address] Dept: [phone]  FOLLOW UP NOTE  Patient ID: Crystal [name], female    DOB: 03-07-1941  Age: 80 y.o. MRN: [id number] Date of Office Visit: 04/21/2021  Assessment  Chief Complaint: Cough  HPI Crystal [name] is an 80 year old female who presents today for follow-up of chronic cough and perennial allergic rhinitis (cat).  She was last seen on March 24, 2021 by Dr. Ernst [name].  Her daughter is here with her today and helps provide history.   Chronic cough is reported as gone for the past 2 weeks.  She has been using ipratropium bromide nasal spray 2 sprays each nostril 3 times a day and she denies any worsening in the dryness of her mouth.  She reports that she takes other medications that cause her mouth dry.  Instructed her to try decreasing the ipratropium bromide nasal spray to 1 spray each nostril 3 times a day as needed and see if that still helps keep her cough at bay.  She continues to use Symbicort 2 puffs twice a day and albuterol as needed as per her pulmonologist.  She also takes pantoprazole 40 mg twice a day.  Perennial allergic rhinitis is reported as controlled with ipratropium via nasal spray.  She did stop using fluticasone nasal spray.  She denies any rhinorrhea, nasal congestion, and postnasal drip.   Drug Allergies:  Allergies  Allergen Reactions  . Bactrim [Sulfamethoxazole-Trimethoprim] Nausea Only    Review of Systems: Review of Systems  Constitutional: Negative for chills and fever.  HENT:       Denies rhinorrhea, nasal congestion, and postnasal drip  Eyes:       Denies itchy watery eyes  Respiratory: Negative for cough.   Cardiovascular: Negative for chest pain and palpitations.  Gastrointestinal: Negative for heartburn.       Reports a little bit of nausea when she overeats.  Denies heartburn and reflux  Genitourinary: Negative for dysuria.  Skin: Negative for itching and rash.  Neurological:  Negative for headaches.    Physical Exam: BP 138/68 (BP Location: Left Arm, Patient Position: Sitting, Cuff Size: Large)   Pulse 60   Temp 98.2 F (36.8 C) (Temporal)   Resp 14   Ht 5\' 3"  (1.6 m)   Wt 220 lb (99.8 kg)   SpO2 96%   BMI 38.97 kg/m    Physical Exam Exam conducted with a chaperone present.  Constitutional:      Appearance: Normal appearance.  HENT:     Head: Normocephalic and atraumatic.     Comments: Pharynx normal, eyes normal, ears normal, nose normal    Right Ear: Tympanic membrane, ear canal and external ear normal.     Left Ear: Tympanic membrane, ear canal and external ear normal.     Nose: Nose normal.     Mouth/Throat:     Mouth: Mucous membranes are moist.     Pharynx: Oropharynx is clear.  Eyes:     Conjunctiva/sclera: Conjunctivae normal.  Cardiovascular:     Rate and Rhythm: Regular rhythm.     Heart sounds: Normal heart sounds.  Pulmonary:     Effort: Pulmonary effort is normal.     Breath sounds: Normal breath sounds.     Comments: Lungs clear to auscultation Musculoskeletal:     Cervical back: Neck supple.  Skin:    General: Skin is warm.  Neurological:     Mental Status: She is alert and oriented to person, place,  and time.  Psychiatric:        Mood and Affect: Mood normal.        Behavior: Behavior normal.        Thought Content: Thought content normal.        Judgment: Judgment normal.     Diagnostics: FVC 2.24 L, FEV1 1.73 L.  Predicted FVC 2.43 L, FEV1 1.85 L.  Spirometry indicates normal respiratory function  Assessment and Plan: 1. Chronic cough   2. Perennial allergic rhinitis     No orders of the defined types were placed in this encounter.   Patient Instructions  1. Chronic cough - Continue to follow up with pulmonary - Continue with Symbicort two puffs twice daily as per Dr. Rolla [name] and [name] with albuterol as needed  2. Chronic rhinitis (cat) - Try decreasing: Atrovent (ipratropium) one spray  per nostril up to Crystal [name], but this can be OVER DRYING, so be careful!   Please let us know if this treatment plan is not working well for you Schedule a follow-up appointment in 5-6 months or sooner if needed     Return in about 5 months (around 09/21/2021), or if symptoms worsen or fail to improve.    Thank you for the opportunity to care for this patient.  Please do not hesitate to contact me with questions.  Althea Charon, FNP [facility] [place]

## 2021-04-24 DIAGNOSIS — I10 Essential (primary) hypertension: Secondary | ICD-10-CM | POA: Diagnosis not present

## 2021-04-24 DIAGNOSIS — E785 Hyperlipidemia, unspecified: Secondary | ICD-10-CM | POA: Diagnosis not present

## 2021-04-24 DIAGNOSIS — K219 Gastro-esophageal reflux disease without esophagitis: Secondary | ICD-10-CM | POA: Diagnosis not present

## 2021-04-28 ENCOUNTER — Telehealth: Payer: Self-pay | Admitting: Nurse Practitioner

## 2021-04-28 NOTE — Telephone Encounter (Signed)
Pt is sch for a follow up next week but does not look like she has been set up for her uptake and scan. Can you please help pt set up

## 2021-04-28 NOTE — Telephone Encounter (Signed)
Pt scheduled for thyroid uptake & scan for 5/10 at 10:00 and 2:00 and 5/11 at 10:00. Pt rescheduled for office visit on 5/16 at 11:00. Pt made aware and voiced understanding.

## 2021-05-03 ENCOUNTER — Other Ambulatory Visit (HOSPITAL_COMMUNITY): Payer: Self-pay | Admitting: Internal Medicine

## 2021-05-03 DIAGNOSIS — M79671 Pain in right foot: Secondary | ICD-10-CM | POA: Diagnosis not present

## 2021-05-03 DIAGNOSIS — M79673 Pain in unspecified foot: Secondary | ICD-10-CM

## 2021-05-04 ENCOUNTER — Ambulatory Visit (HOSPITAL_COMMUNITY)
Admission: RE | Admit: 2021-05-04 | Discharge: 2021-05-04 | Disposition: A | Discharge status: Home / Self Care | Payer: Medicare Other | Source: Ambulatory Visit | Attending: Nurse Practitioner | Admitting: Nurse Practitioner

## 2021-05-04 ENCOUNTER — Ambulatory Visit (HOSPITAL_COMMUNITY)
Admission: RE | Admit: 2021-05-04 | Discharge: 2021-05-04 | Disposition: A | Discharge status: Home / Self Care | Payer: Medicare Other | Source: Ambulatory Visit | Attending: Internal Medicine | Admitting: Internal Medicine

## 2021-05-04 ENCOUNTER — Telehealth: Payer: Self-pay

## 2021-05-04 ENCOUNTER — Other Ambulatory Visit: Payer: Self-pay | Admitting: Urology

## 2021-05-04 DIAGNOSIS — M19071 Primary osteoarthritis, right ankle and foot: Secondary | ICD-10-CM | POA: Diagnosis not present

## 2021-05-04 DIAGNOSIS — R7989 Other specified abnormal findings of blood chemistry: Secondary | ICD-10-CM | POA: Diagnosis not present

## 2021-05-04 DIAGNOSIS — M2141 Flat foot [pes planus] (acquired), right foot: Secondary | ICD-10-CM | POA: Diagnosis not present

## 2021-05-04 DIAGNOSIS — M79673 Pain in unspecified foot: Secondary | ICD-10-CM | POA: Diagnosis not present

## 2021-05-04 DIAGNOSIS — G8929 Other chronic pain: Secondary | ICD-10-CM | POA: Diagnosis not present

## 2021-05-04 DIAGNOSIS — E0590 Thyrotoxicosis, unspecified without thyrotoxic crisis or storm: Secondary | ICD-10-CM | POA: Insufficient documentation

## 2021-05-04 DIAGNOSIS — N281 Cyst of kidney, acquired: Secondary | ICD-10-CM

## 2021-05-04 DIAGNOSIS — M21611 Bunion of right foot: Secondary | ICD-10-CM | POA: Diagnosis not present

## 2021-05-04 MED ORDER — SODIUM IODIDE I-123 7.4 MBQ CAPS
446.0000 | ORAL_CAPSULE | Freq: Once | ORAL | Status: AC
  Administered 2021-05-04: 446 via ORAL

## 2021-05-04 NOTE — Telephone Encounter (Signed)
-----   Message from Franchot Gallo, MD sent at 05/04/2021  2:25 PM EDT ----- Notify patient that ultrasound revealed some findings that I think should be evaluated with an MRI.  I put that order in place ----- Message ----- From: Dorisann Frames, RN Sent: 04/21/2021   9:59 AM EDT To: Franchot Gallo, MD  Please review

## 2021-05-04 NOTE — Telephone Encounter (Signed)
Patient called and made aware of results. Voiced understanding.

## 2021-05-05 ENCOUNTER — Telehealth: Payer: Medicare Other | Admitting: Nurse Practitioner

## 2021-05-05 ENCOUNTER — Other Ambulatory Visit: Payer: Self-pay

## 2021-05-05 ENCOUNTER — Ambulatory Visit (HOSPITAL_COMMUNITY)
Admission: RE | Admit: 2021-05-05 | Discharge: 2021-05-05 | Disposition: A | Discharge status: Home / Self Care | Payer: Medicare Other | Source: Ambulatory Visit | Attending: Nurse Practitioner | Admitting: Nurse Practitioner

## 2021-05-05 DIAGNOSIS — E042 Nontoxic multinodular goiter: Secondary | ICD-10-CM | POA: Diagnosis not present

## 2021-05-10 ENCOUNTER — Encounter: Payer: Self-pay | Admitting: Allergy & Immunology

## 2021-05-10 ENCOUNTER — Telehealth: Payer: Medicare Other | Admitting: Nurse Practitioner

## 2021-05-11 ENCOUNTER — Other Ambulatory Visit: Payer: Self-pay

## 2021-05-11 ENCOUNTER — Encounter: Payer: Self-pay | Admitting: Nurse Practitioner

## 2021-05-11 ENCOUNTER — Ambulatory Visit: Payer: Medicare Other | Admitting: Nurse Practitioner

## 2021-05-11 VITALS — BP 160/81 | HR 65 | Ht 63.0 in | Wt 217.8 lb

## 2021-05-11 DIAGNOSIS — E0590 Thyrotoxicosis, unspecified without thyrotoxic crisis or storm: Secondary | ICD-10-CM | POA: Diagnosis not present

## 2021-05-11 DIAGNOSIS — R7989 Other specified abnormal findings of blood chemistry: Secondary | ICD-10-CM | POA: Diagnosis not present

## 2021-05-11 NOTE — Progress Notes (Signed)
05/11/2021     Endocrinology Follow Up Note     Subjective:    Patient ID: Crystal [name], female    DOB: 08/13/1941, PCP Asencion Noble, MD.   Past Medical History:  Diagnosis Date  . Anxiety   . Arthritis   . Breast cancer ([place]) 03/21/15   right  . Chronic fatigue   . Colitis, ischemic ([place]) 08/2007, 05/2011   Last colonoscopy/bx by Dr Rourk->(08/31/07) descending colon  . Depression   . Diverticulosis   . Enlarged thyroid   . Gait abnormality 10/18/2017  . GERD (gastroesophageal reflux disease)   . Gout   . HTN (hypertension)   . Hypercholesteremia   . Lumbar spinal stenosis 11/07/2017   Severe at L4-5, moderate at L3-4  . 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 OA (osteoarthritis)   . Obesity   . PONV (postoperative nausea and vomiting)   . Ptosis of eyelid, left   . Sleep apnea    cpap  . Spinal stenosis   . Tubular adenoma of colon 08/2007    Past Surgical History:  Procedure Laterality Date  . ABDOMINAL HYSTERECTOMY     partial  . BREAST CYST EXCISION     benign, left x2, right x1  . CATARACT EXTRACTION W/PHACO  12/01/2011   Procedure: CATARACT EXTRACTION PHACO AND INTRAOCULAR LENS PLACEMENT (IOC);  Surgeon: Tonny Branch;  Location: [facility];  Service: Ophthalmology;  Laterality: Right;  CDE=14.87  . CATARACT EXTRACTION W/PHACO  02/06/2012   Procedure: CATARACT EXTRACTION PHACO AND INTRAOCULAR LENS PLACEMENT (IOC);  Surgeon: Tonny Branch, MD;  Location: [facility];  Service: Ophthalmology;  Laterality: Left;  CDE 13.00  . COLONOSCOPY  Sept 2008   Last colonoscopy/bx by Dr Rourk->(08/31/07) descending colon TUBULAR ADENOMA  . COLONOSCOPY  10/25/2012   Procedure: COLONOSCOPY;  Surgeon: Daneil Dolin, MD;  Location: [facility];  Service: Endoscopy;  Laterality: N/A;  10:15-changed to 10:30 Darius Bump notified  . PARTIAL MASTECTOMY WITH NEEDLE LOCALIZATION AND AXILLARY SENTINEL LYMPH NODE BX Right 03/18/2015   Procedure: PARTIAL MASTECTOMY AFTER NEEDLE LOCALIZATION AND AXILLARY  SENTINEL LYMPH NODE BX;  Surgeon: Aviva Signs Md, MD;  Location: [facility];  Service: General;  Laterality: Right;  . PTOSIS REPAIR Left 05/11/2017   Procedure: INTERNAL PTOSIS REPAIR OF LEFT EYELID;  Surgeon: Clista Bernhardt, MD;  Location: [facility];  Service: Ophthalmology;  Laterality: Left;  . TUBAL LIGATION  1970   hysterectomy  . TUBAL LIGATION      Social History   Socioeconomic History  . Marital status: Widowed    Spouse name: Not on file  . Number of children: 3  . Years of education: 64  . Highest education level: Not on file  Occupational History  . Occupation: Lab Tech-retired  Tobacco Use  . Smoking status: Never Smoker  . Smokeless tobacco: Never Used  Vaping Use  . Vaping Use: Never used  Substance and Sexual Activity  . Alcohol use: No  . Drug use: No  . Sexual activity: Not Currently    Birth control/protection: Surgical  Other Topics Concern  . Not on file  Social History Narrative   Lives alone   Caffeine use: less than 8 oz per week   Right handed    Social Determinants of Health   Financial Resource Strain: Not on file  Food Insecurity: Not on file  Transportation Needs: Not on file  Physical Activity: Not on file  Stress: Not on file  Social Connections:  Not on file    Family History  Problem Relation Age of Onset  . Cancer Brother 100  . Coronary artery disease Brother   . Diabetes Sister   . Heart disease Father   . Heart disease Brother   . Anesthesia problems Neg Hx   . Hypotension Neg Hx   . Pseudochol deficiency Neg Hx   . Malignant hyperthermia Neg Hx   . Colon cancer Neg Hx     Outpatient Encounter Medications as of 05/11/2021  Medication Sig  . acetaminophen (TYLENOL) 650 MG CR tablet Take 1,300 mg by mouth 2 (two) times daily.  Marland Kitchen amLODipine (NORVASC) 10 MG tablet Take 10 mg by mouth daily.  . Artificial Tear Solution (SOOTHE XP OP) Place 1 drop into both eyes 2 (two) times daily.  Marland Kitchen buPROPion (WELLBUTRIN SR) 150 MG 12 hr  tablet Take 150 mg by mouth 2 (two) times daily.  . Calcium-Vitamin D (CALTRATE 600 PLUS-VIT D PO) Take 1 tablet by mouth daily.  . chlorthalidone (HYGROTON) 25 MG tablet Take 25 mg by mouth every morning.  . docusate sodium (COLACE) 100 MG capsule Take 100 mg by mouth 2 (two) times daily.  Marland Kitchen FLUoxetine (PROZAC) 20 MG capsule Take 20 mg by mouth daily.  Marland Kitchen ipratropium (ATROVENT) 0.03 % nasal spray Place 2 sprays into both nostrils 3 (three) times daily.  . metoprolol succinate (TOPROL-XL) 25 MG 24 hr tablet Take 25 mg by mouth daily.  . montelukast (SINGULAIR) 10 MG tablet Take 1 tablet (10 mg total) by mouth at bedtime.  . pantoprazole (PROTONIX) 40 MG tablet TAKE 1 TABLET BY MOUTH TWICE DAILY (Patient taking differently: 40 mg 2 (two) times daily.)  . simvastatin (ZOCOR) 10 MG tablet Take 10 mg by mouth at bedtime.   . SYMBICORT 160-4.5 MCG/ACT inhaler INHALE 2 PUFFS INTO THE LUNGS IN THE MORNING AND AT BEDTIME   No facility-administered encounter medications on file as of 05/11/2021.    ALLERGIES: Allergies  Allergen Reactions  . Bactrim [Sulfamethoxazole-Trimethoprim] Nausea Only    VACCINATION STATUS: Immunization History  Administered Date(s) Administered  . Influenza, High Dose Seasonal PF 10/23/2018  . Influenza-Unspecified 09/10/2015  . Moderna Sars-Covid-2 Vaccination 02/19/2020, 03/18/2020, 12/10/2020  . Pneumococcal Conjugate-13 12/25/2014     HPI  Crystal [name] is 80 y.o. female who presents today with a medical history as above. she is being seen in follow up after being seen in consultation for hyperthyroidism requested by Asencion Noble, MD.  she has been dealing with symptoms of chronic cough, intermittent trouble sleeping for month/years. These symptoms are progressively worsening and troubling to her.  her most recent thyroid labs revealed suppressed TSH of 0.098 on 03/22/21.  She reports trouble breathing when she goes to the dentist and lays back with mouth open.  She  gets her thyroid labs done every year and this is the first time anything was abnormal. she denies dysphagia, choking, shortness of breath, no recent voice change.    she reports family history of thyroid dysfunction in her mother with significant goiter, but denies family hx of thyroid cancer. she denies personal history of goiter. she is not on any anti-thyroid medications nor on any thyroid hormone supplements. Denies use of Biotin containing supplements.  she is willing to proceed with appropriate work up and therapy for thyrotoxicosis.   Review of systems  Constitutional: + Minimally fluctuating body weight, current Body mass index is 38.58 kg/m., no fatigue, no subjective hyperthermia, no subjective hypothermia Eyes: no  blurry vision, no xerophthalmia ENT: no sore throat, no nodules palpated in throat, no dysphagia/odynophagia, no hoarseness Cardiovascular: no chest pain, shortness of breath with exertion, no palpitations, no leg swelling Respiratory:  Chronic cough- improved since starting inhalation treatments but recently getting worse again, no shortness of breath Gastrointestinal: no nausea/vomiting/diarrhea Musculoskeletal: no muscle/joint aches, walks with walker due to deconditioning Skin: no rashes, no hyperemia Neurological: no tremors, no numbness, no tingling, no dizziness Psychiatric: no depression, no anxiety   Objective:    BP (!) 160/81   Pulse 65   Ht 5\' 3"  (1.6 m)   Wt 217 lb 12.8 oz (98.8 kg)   SpO2 95%   BMI 38.58 kg/m   Wt Readings from Last 3 Encounters:  05/11/21 217 lb 12.8 oz (98.8 kg)  04/21/21 220 lb (99.8 kg)  04/13/21 220 lb (99.8 kg)     BP Readings from Last 3 Encounters:  05/11/21 (!) 160/81  04/21/21 138/68  04/13/21 (!) 171/73     Physical Exam- Limited  Constitutional:  Body mass index is 38.58 kg/m. , not in acute distress, normal state of mind Eyes:  EOMI, no exophthalmos Neck: neck fullness R>L Thyroid: + gross  goiter Cardiovascular: RRR, no murmurs, rubs, or gallops, no edema Respiratory: Adequate breathing efforts, no crackles, rales, rhonchi, or wheezing Musculoskeletal: no gross deformities, strength intact in all four extremities, no gross restriction of joint movements, walks with walker due to deconditioning Skin:  no rashes, no hyperemia Neurological: no tremor with outstretched hands  CMP     Component Value Date/Time   NA 139 03/25/2021 1015   K 3.4 (L) 03/25/2021 1015   CL 100 03/25/2021 1015   CO2 29 03/25/2021 1015   GLUCOSE 92 03/25/2021 1015   BUN 20 03/25/2021 1015   CREATININE 0.90 03/25/2021 1015   CALCIUM 9.6 03/25/2021 1015   PROT 7.1 03/25/2021 1015   ALBUMIN 4.0 03/25/2021 1015   AST 25 03/25/2021 1015   ALT 20 03/25/2021 1015   ALKPHOS 57 03/25/2021 1015   BILITOT 0.7 03/25/2021 1015   GFRNONAA >60 03/25/2021 1015   GFRAA >60 08/27/2020 1025     CBC    Component Value Date/Time   WBC 5.4 03/25/2021 1015   RBC 4.20 03/25/2021 1015   HGB 13.3 03/25/2021 1015   HCT 40.5 03/25/2021 1015   PLT 212 03/25/2021 1015   MCV 96.4 03/25/2021 1015   MCH 31.7 03/25/2021 1015   MCHC 32.8 03/25/2021 1015   RDW 12.9 03/25/2021 1015   LYMPHSABS 1.5 03/25/2021 1015   MONOABS 0.5 03/25/2021 1015   EOSABS 0.2 03/25/2021 1015   BASOSABS 0.0 03/25/2021 1015     Diabetic Labs (most recent): No results found for: HGBA1C  Lipid Panel  No results found for: CHOL, TRIG, HDL, CHOLHDL, VLDL, LDLCALC, LDLDIRECT, LABVLDL   Lab Results  Component Value Date   TSH 0.045 (L) 04/08/2021   TSH 0.10 (A) 03/22/2021   TSH 1.31 03/19/2020   FREET4 1.72 04/08/2021     Results for TANECIA, MCCAY (MRN [id number]) as of 04/15/2021 08:29  Ref. Range 04/08/2021 14:31  TSH Latest Ref Range: 0.450 - 4.500 uIU/mL 0.045 (L)  Triiodothyronine,Free,Serum Latest Ref Range: 2.0 - 4.4 pg/mL 3.5  T4,Free(Direct) Latest Ref Range: 0.82 - 1.77 ng/dL 1.72  Thyroperoxidase Ab SerPl-aCnc Latest  Ref Range: 0 - 34 IU/mL <8  Thyroglobulin Antibody Latest Ref Range: 0.0 - 0.9 IU/mL <1.0    Thyroid Ultrasound from 02/22/21 CLINICAL DATA:  Palpable  abnormality.  Palpable thyroid nodule.   EXAM: THYROID ULTRASOUND   TECHNIQUE: Ultrasound examination of the thyroid gland and adjacent soft tissues was performed.   COMPARISON:  Chest CT-07/20/2020   FINDINGS: Parenchymal Echotexture: Markedly heterogenous   Isthmus: Enlarged measuring 1.1 cm in diameter   Right lobe: Enlarged measuring 8.6 x 3.4 x 3.6 cm   Left lobe: Enlarged measuring 6.1 x 3.9 x 3.6 cm   _________________________________________________________   Estimated total number of nodules >/= 1 cm: 1   Number of spongiform nodules >/=  2 cm not described below (TR1): 0   Number of mixed cystic and solid nodules >/= 1.5 cm not described below (TR2): 0   _________________________________________________________   Questioned 3.9 x 3.8 x 3.2 cm isoechoic ill-defined nodule/mass within the left lobe of the thyroid is favored to represent a pseudonodule as it lacks defined borders on both provided transverse and sagittal images, likely accentuated due to marked heterogeneity of the thyroid parenchymal echotexture.   IMPRESSION: Enlarged and markedly heterogeneous thyroid without discrete worrisome nodule or mass.   The above is in keeping with the ACR TI-RADS recommendations - J Am Coll Radiol 2017;14:587-595.     Electronically Signed   By: Sandi Mariscal M.D.   On: 02/22/2021 11:47 ---------------------------------------------------------------------------------------------------------------------------  UPTAKE AND SCAN RESULTS FROM 05/05/21 CLINICAL DATA:  Suppressed TSH, suspected hyperthyroidism  EXAM: THYROID SCAN AND UPTAKE - 4 AND 24 HOURS  TECHNIQUE: Following oral administration of I-123 capsule, anterior planar imaging was acquired at 24 hours. Thyroid uptake was calculated with a thyroid  probe at 4-6 hours and 24 hours.  RADIOPHARMACEUTICALS:  446 uCi I-123 sodium iodide p.o.  COMPARISON:  None  Correlation: Thyroid ultrasound 02/22/2021  FINDINGS: Heterogeneous tracer uptake throughout both thyroid lobes. Areas of cold nodules are seen at the upper pole and lower pole LEFT lobe as well as lower pole RIGHT lobe. Small foci of focally increased tracer uptake at isthmus and RIGHT upper pole. Findings are consistent with a multinodular thyroid gland. No discrete thyroid nodules identified at the sites on ultrasound.  4 hour I-123 uptake = 7.8% (normal 5-20%)  24 hour I-123 uptake = 25.9% (normal 10-30%)  IMPRESSION: Enlarged multinodular thyroid gland.  Normal 4 hour and 24 hour radio iodine uptakes.   Electronically Signed   By: Lavonia Dana M.D.   On: 05/05/2021 11:51  Assessment & Plan:   1. Subclinical hyperthyroidism  she is being seen at a kind request of Asencion Noble, MD.  her history and most recent labs are reviewed, and she was examined clinically.    Her repeat thyroid function tests showed increasingly suppressed TSH of 0.045 with high normal FT4 level of 1.72 and normal FT3 level of 3.5.  Her thyroid antibody testing was negative, ruling out autoimmune thyroid disease such as Graves or Hashimoto's.    Her uptake and scan results showed grossly enlarged thyroid, no hot or cold nodules, and normal uptake at 4 hr (7.8%) and 24 hrs (25.9%).    -Her symptoms are stable at this time, and although she does have an enlarged thyroid, she is not a good surgical candidate due to her age and comorbidities. I do not feel she needs antithyroid medication at this time.  I suspect she had acute thyroiditis which will resolve with time.  Will monitor thyroid function tests in 2 months to reevaluate.      -Patient is advised to maintain close follow up with Asencion Noble, MD for primary care needs.  I spent 30 minutes in the care of the patient  today including review of labs from Thyroid Function, CMP, and other relevant labs ; imaging/biopsy records (current and previous including abstractions from other facilities); face-to-face time discussing  her lab results and symptoms, medications doses, her options of short and long term treatment based on the latest standards of care / guidelines;   and documenting the encounter.  Crystal [name]  participated in the discussions, expressed understanding, and voiced agreement with the above plans.  All questions were answered to her satisfaction. she is encouraged to contact clinic should she have any questions or concerns prior to her return visit.    Follow up plan: Return in about 2 months (around 07/11/2021) for Thyroid follow up, Previsit labs, Virtual visit ok.   Thank you for involving me in the care of this pleasant patient, and I will continue to update you with her progress.   Rayetta Pigg, [facility] [address] [place], [place] [address] Phone: [phone] Fax: [phone]   05/11/2021, 10:52 AM

## 2021-05-12 DIAGNOSIS — D3132 Benign neoplasm of left choroid: Secondary | ICD-10-CM | POA: Diagnosis not present

## 2021-05-13 MED ORDER — PREDNISONE 10 MG PO TABS: ORAL_TABLET | ORAL | 0 refills | Status: DC

## 2021-05-13 MED ORDER — ALBUTEROL SULFATE HFA 108 (90 BASE) MCG/ACT IN AERS: 4.0000 | INHALATION_SPRAY | Freq: Four times a day (QID) | RESPIRATORY_TRACT | 2 refills | Status: DC | PRN

## 2021-05-13 NOTE — Telephone Encounter (Signed)
I called Ms. Audino. She had been controlled for the past few weeks but it returned over the last week. She has been afebrile. She describes the cough as dry. She is still using Symbicort two puffs twice daily. She does not have any other inhaler. I went through all of the names for albuterol.   I sent in albuterol as well as a short prednisone taper. Patient's sister is going to pick up the medications.  Salvatore Marvel, MD [facility]

## 2021-05-19 ENCOUNTER — Ambulatory Visit (HOSPITAL_COMMUNITY)
Admission: RE | Admit: 2021-05-19 | Discharge: 2021-05-19 | Disposition: A | Discharge status: Home / Self Care | Payer: Medicare Other | Source: Ambulatory Visit | Attending: Urology | Admitting: Urology

## 2021-05-19 DIAGNOSIS — K449 Diaphragmatic hernia without obstruction or gangrene: Secondary | ICD-10-CM | POA: Diagnosis not present

## 2021-05-19 DIAGNOSIS — N281 Cyst of kidney, acquired: Secondary | ICD-10-CM | POA: Insufficient documentation

## 2021-05-19 DIAGNOSIS — K769 Liver disease, unspecified: Secondary | ICD-10-CM | POA: Diagnosis not present

## 2021-05-19 DIAGNOSIS — K8020 Calculus of gallbladder without cholecystitis without obstruction: Secondary | ICD-10-CM | POA: Diagnosis not present

## 2021-05-19 MED ORDER — GADOBUTROL 1 MMOL/ML IV SOLN
10.0000 mL | Freq: Once | INTRAVENOUS | Status: AC | PRN
  Administered 2021-05-19: 10 mL via INTRAVENOUS

## 2021-05-25 ENCOUNTER — Encounter: Payer: Self-pay | Admitting: Urology

## 2021-05-25 NOTE — Progress Notes (Signed)
Results sent via my chart 

## 2021-06-22 ENCOUNTER — Other Ambulatory Visit: Payer: Self-pay

## 2021-06-22 ENCOUNTER — Ambulatory Visit (HOSPITAL_COMMUNITY)
Admission: RE | Admit: 2021-06-22 | Discharge: 2021-06-22 | Disposition: A | Discharge status: Home / Self Care | Payer: Medicare Other | Source: Ambulatory Visit | Attending: Internal Medicine | Admitting: Internal Medicine

## 2021-06-22 ENCOUNTER — Other Ambulatory Visit (HOSPITAL_COMMUNITY): Payer: Self-pay | Admitting: Internal Medicine

## 2021-06-22 DIAGNOSIS — R053 Chronic cough: Secondary | ICD-10-CM | POA: Diagnosis not present

## 2021-06-22 DIAGNOSIS — I517 Cardiomegaly: Secondary | ICD-10-CM | POA: Diagnosis not present

## 2021-06-22 DIAGNOSIS — R0602 Shortness of breath: Secondary | ICD-10-CM | POA: Diagnosis not present

## 2021-06-22 DIAGNOSIS — R051 Acute cough: Secondary | ICD-10-CM | POA: Diagnosis not present

## 2021-07-05 DIAGNOSIS — E0590 Thyrotoxicosis, unspecified without thyrotoxic crisis or storm: Secondary | ICD-10-CM | POA: Diagnosis not present

## 2021-07-06 LAB — TSH: TSH: 0.847 u[IU]/mL (ref 0.450–4.500)

## 2021-07-06 LAB — T4, FREE: Free T4: 1.43 ng/dL (ref 0.82–1.77)

## 2021-07-06 LAB — T3, FREE: T3, Free: 3 pg/mL (ref 2.0–4.4)

## 2021-07-12 ENCOUNTER — Telehealth (INDEPENDENT_AMBULATORY_CARE_PROVIDER_SITE_OTHER): Payer: Medicare Other | Admitting: Nurse Practitioner

## 2021-07-12 ENCOUNTER — Encounter: Payer: Self-pay | Admitting: Nurse Practitioner

## 2021-07-12 DIAGNOSIS — E0590 Thyrotoxicosis, unspecified without thyrotoxic crisis or storm: Secondary | ICD-10-CM | POA: Diagnosis not present

## 2021-07-12 NOTE — Patient Instructions (Signed)
Hyperthyroidism  Hyperthyroidism is when the thyroid gland is too active (overactive). The thyroid gland is a small gland located in the lower front part of the neck, just in front of the windpipe (trachea). This gland makes hormones that help control how the body uses food for energy (metabolism) as well as how the heart and brain function. These hormones also play a role in keeping your bones strong. When the thyroid is overactive, it produces toomuch of a hormone called thyroxine. What are the causes? This condition may be caused by: Graves' disease. This is a disorder in which the body's disease-fighting system (immune system) attacks the thyroid gland. This is the most common cause. Inflammation of the thyroid gland. A tumor in the thyroid gland. Use of certain medicines, including: Prescription thyroid hormone replacement. Herbal supplements that mimic thyroid hormones. Amiodarone therapy. Solid or fluid-filled lumps within your thyroid gland (thyroid nodules). Taking in a large amount of iodine from foods or medicines. What increases the risk? You are more likely to develop this condition if: You are female. You have a family history of thyroid conditions. You smoke tobacco. You use a medicine called lithium. You take medicines that affect the immune system (immunosuppressants). What are the signs or symptoms? Symptoms of this condition include: Nervousness. Inability to tolerate heat. Unexplained weight loss. Diarrhea. Change in the texture of hair or skin. Heart skipping beats or making extra beats. Rapid heart rate. Loss of menstruation. Shaky hands. Fatigue. Restlessness. Sleep problems. Enlarged thyroid gland or a lump in the thyroid (nodule). You may also have symptoms of Graves' disease, which may include: Protruding eyes. Dry eyes. Red or swollen eyes. Problems with vision. How is this diagnosed? This condition may be diagnosed based on: Your symptoms and  medical history. A physical exam. Blood tests. Thyroid ultrasound. This test involves using sound waves to produce images of the thyroid gland. A thyroid scan. A radioactive substance is injected into a vein, and images show how much iodine is present in the thyroid. Radioactive iodine uptake test (RAIU). A small amount of radioactive iodine is given by mouth to see how much iodine the thyroid absorbs after a certain amount of time. How is this treated? Treatment depends on the cause and severity of the condition. Treatment may include: Medicines to reduce the amount of thyroid hormone your body makes. Radioactive iodine treatment (radioiodine therapy). This involves swallowing a small dose of radioactive iodine, in capsule or liquid form, to kill thyroid cells. Surgery to remove part or all of your thyroid gland. You may need to take thyroid hormone replacement medicine for the rest of your life after thyroid surgery. Medicines to help manage your symptoms. Follow these instructions at home:  Take over-the-counter and prescription medicines only as told by your health care provider. Do not use any products that contain nicotine or tobacco, such as cigarettes and e-cigarettes. If you need help quitting, ask your health care provider. Follow any instructions from your health care provider about diet. You may be instructed to limit foods that contain iodine. Keep all follow-up visits as told by your health care provider. This is important. You will need to have blood tests regularly so that your health care provider can monitor your condition. Contact a health care provider if: Your symptoms do not get better with treatment. You have a fever. You are taking thyroid hormone replacement medicine and you: Have symptoms of depression. Feel like you are tired all the time. Gain weight. Get help right  away if: You have chest pain. You have decreased alertness or a change in your awareness. You  have abdominal pain. You feel dizzy. You have a rapid heartbeat. You have an irregular heartbeat. You have difficulty breathing. Summary The thyroid gland is a small gland located in the lower front part of the neck, just in front of the windpipe (trachea). Hyperthyroidism is when the thyroid gland is too active (overactive) and produces too much of a hormone called thyroxine. The most common cause is Graves' disease, a disorder in which your immune system attacks the thyroid gland. Hyperthyroidism can cause various symptoms, such as unexplained weight loss, nervousness, inability to tolerate heat, or changes in your heartbeat. Treatment may include medicine to reduce the amount of thyroid hormone your body makes, radioiodine therapy, surgery, or medicines to manage symptoms. This information is not intended to replace advice given to you by your health care provider. Make sure you discuss any questions you have with your healthcare provider. Document Revised: 08/27/2020 Document Reviewed: 08/27/2020 Elsevier Patient Education  2022 [employer].

## 2021-07-12 NOTE — Progress Notes (Signed)
07/12/2021     Endocrinology Follow Up Note    TELEHEALTH VISIT: The patient is being engaged in telehealth visit due to COVID-19.  This type of visit limits physical examination significantly, and thus is not preferable over face-to-face encounters.  I connected with  Crystal [name] on 07/12/21 by a video enabled telemedicine application and verified that I am speaking with the correct person using two identifiers.   I discussed the limitations of evaluation and management by telemedicine. The patient expressed understanding and agreed to proceed.    The participants involved in this visit include: Brita Romp, NP located at [facility] and Crystal [name]  located at their personal residence listed.   Subjective:    Patient ID: Crystal [name], female    DOB: 1941-04-07, PCP Asencion Noble, MD.   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   OA (osteoarthritis)    Obesity    PONV (postoperative nausea and vomiting)    Ptosis of eyelid, left    Sleep apnea    cpap   Spinal stenosis    Tubular adenoma of colon 08/2007    Past Surgical History:  Procedure Laterality Date   ABDOMINAL HYSTERECTOMY     partial   BREAST CYST EXCISION     benign, left x2, right x1   CATARACT EXTRACTION W/PHACO  12/01/2011   Procedure: CATARACT EXTRACTION PHACO AND INTRAOCULAR LENS PLACEMENT ([place]);  Surgeon: Tonny Branch;  Location: [facility];  Service: Ophthalmology;  Laterality: Right;  CDE=14.87   CATARACT  EXTRACTION W/PHACO  02/06/2012   Procedure: CATARACT EXTRACTION PHACO AND INTRAOCULAR LENS PLACEMENT ([facility]);  Surgeon: Tonny Branch, MD;  Location: [facility];  Service: Ophthalmology;  Laterality: Left;  CDE 13.00   COLONOSCOPY  Sept 2008   Last colonoscopy/bx by Dr Rourk->(08/31/07) descending colon TUBULAR ADENOMA   COLONOSCOPY  10/25/2012   Procedure: COLONOSCOPY;  Surgeon: Daneil Dolin, MD;  Location: [facility];  Service: Endoscopy;  Laterality: N/A;  10:15-changed to 10:30 Leigh Ann notified   PARTIAL MASTECTOMY WITH NEEDLE LOCALIZATION AND AXILLARY SENTINEL LYMPH NODE BX Right 03/18/2015   Procedure: PARTIAL MASTECTOMY AFTER NEEDLE LOCALIZATION AND AXILLARY SENTINEL LYMPH NODE BX;  Surgeon: Aviva Signs Md, MD;  Location: [facility];  Service: General;  Laterality: Right;   PTOSIS REPAIR Left 05/11/2017   Procedure: INTERNAL PTOSIS REPAIR OF LEFT EYELID;  Surgeon: Clista Bernhardt, MD;  Location: [place];  Service: Ophthalmology;  Laterality: Left;   TUBAL LIGATION  1970   hysterectomy   TUBAL LIGATION      Social History   Socioeconomic History   Marital status: Widowed    Spouse name: Not on file   Number of children: 3   Years of education: 66   Highest education level: Not on file  Occupational History   Occupation: Lab Tech-retired  Tobacco Use   Smoking status: Never   Smokeless tobacco: Never  Vaping Use   Vaping Use: Never used  Substance and Sexual Activity   Alcohol use: No   Drug use: No   Sexual activity: Not Currently    Birth control/protection: Surgical  Other Topics Concern   Not on file  Social History Narrative   Lives alone   Caffeine use: less than 8 oz per week   Right handed    Social Determinants of Health   Financial Resource Strain: Not on file  Food Insecurity: Not on file  Transportation Needs: Not on file  Physical Activity: Not on file  Stress: Not on file  Social Connections: Not on file    Family History  Problem Relation Age of Onset    Cancer Brother 59   Coronary artery disease Brother    Diabetes Sister    Heart disease Father    Heart disease Brother    Anesthesia problems Neg Hx    Hypotension Neg Hx    Pseudochol deficiency Neg Hx    Malignant hyperthermia Neg Hx    Colon cancer Neg Hx     Outpatient Encounter Medications as of 07/12/2021  Medication Sig   acetaminophen (TYLENOL) 650 MG CR tablet Take 1,300 mg by mouth 2 (two) times daily.   albuterol (VENTOLIN HFA) 108 (90 Base) MCG/ACT inhaler Inhale 4 puffs into the lungs every 6 (six) hours as needed for wheezing or shortness of breath.   amLODipine (NORVASC) 10 MG tablet Take 10 mg by mouth daily.   Artificial Tear Solution (SOOTHE XP OP) Place 1 drop into both eyes 2 (two) times daily.   buPROPion (WELLBUTRIN SR) 150 MG 12 hr tablet Take 150 mg by mouth 2 (two) times daily.   Calcium-Vitamin D (CALTRATE 600 PLUS-VIT D PO) Take 1 tablet by mouth daily.   chlorthalidone (HYGROTON) 25 MG tablet Take 25 mg by mouth every morning.   docusate sodium (COLACE) 100 MG capsule Take 100 mg by mouth 2 (two) times daily.   FLUoxetine (PROZAC) 20 MG capsule Take 20 mg by mouth daily.   ipratropium (ATROVENT) 0.03 % nasal spray Place 2 sprays into both nostrils 3 (three) times daily.   metoprolol succinate (TOPROL-XL) 25 MG 24 hr tablet Take 25 mg by mouth daily.   montelukast (SINGULAIR) 10 MG tablet Take 1 tablet (10 mg total) by mouth at bedtime.   pantoprazole (PROTONIX) 40 MG tablet TAKE 1 TABLET BY MOUTH TWICE DAILY (Patient taking differently: 40 mg 2 (two) times daily.)   predniSONE (DELTASONE) 10 MG tablet Take two tablets (20mg ) twice daily for three days, then one tablet (10mg ) twice daily for three days, then STOP.   simvastatin (ZOCOR) 10 MG tablet Take 10 mg by mouth at bedtime.    SYMBICORT 160-4.5 MCG/ACT inhaler INHALE 2 PUFFS INTO THE LUNGS IN THE MORNING AND AT BEDTIME   No facility-administered encounter medications on file as of 07/12/2021.     ALLERGIES: Allergies  Allergen Reactions   Bactrim [Sulfamethoxazole-Trimethoprim] Nausea Only    VACCINATION STATUS: Immunization History  Administered Date(s) Administered   Influenza, High Dose Seasonal PF 10/23/2018   Influenza-Unspecified 09/10/2015   Moderna Sars-Covid-2 Vaccination 02/19/2020, 03/18/2020, 12/10/2020   Pneumococcal Conjugate-13 12/25/2014     HPI  Crystal [name] is 80 y.o. female who presents today with a medical history as above. she is being seen in follow up after being seen in consultation for hyperthyroidism requested by Asencion Noble, MD.  she has been dealing with symptoms of chronic cough, intermittent trouble  sleeping for month/years. These symptoms are progressively worsening and troubling to her.  her most recent thyroid labs revealed suppressed TSH of 0.098 on 03/22/21.  She reports trouble breathing when she goes to the dentist and lays back with mouth open.  She gets her thyroid labs done every year and this is the first time anything was abnormal. she denies dysphagia, choking, shortness of breath, no recent voice change.    she reports family history of thyroid dysfunction in her mother with significant goiter, but denies family hx of thyroid cancer. she denies personal history of goiter. she is not on any anti-thyroid medications nor on any thyroid hormone supplements. Denies use of Biotin containing supplements.  she is willing to proceed with appropriate work up and therapy for thyrotoxicosis.   Review of systems  Constitutional: + Minimally fluctuating body weight,  current There is no height or weight on file to calculate BMI. , no fatigue, no subjective hyperthermia, no subjective hypothermia Eyes: no blurry vision, no xerophthalmia ENT: no sore throat, no nodules palpated in throat, no dysphagia/odynophagia, no hoarseness Cardiovascular: no chest pain, no shortness of breath, no palpitations, no leg swelling Respiratory: + cough-improved,  no shortness of breath Gastrointestinal: no nausea/vomiting/diarrhea Musculoskeletal: no muscle/joint aches, complains of generalized weakness- needing walker to get around Skin: no rashes, no hyperemia Neurological: no tremors, no numbness, no tingling, no dizziness Psychiatric: no depression, no anxiety   Objective:    There were no vitals taken for this visit.  Wt Readings from Last 3 Encounters:  05/11/21 217 lb 12.8 oz (98.8 kg)  04/21/21 220 lb (99.8 kg)  04/13/21 220 lb (99.8 kg)     BP Readings from Last 3 Encounters:  05/11/21 (!) 160/81  04/21/21 138/68  04/13/21 (!) 171/73    Physical Exam- Telehealth- significantly limited due to nature of visit  Constitutional: There is no height or weight on file to calculate BMI. , not in acute distress, normal state of mind Respiratory: Adequate breathing efforts    CMP     Component Value Date/Time   NA 139 03/25/2021 1015   K 3.4 (L) 03/25/2021 1015   CL 100 03/25/2021 1015   CO2 29 03/25/2021 1015   GLUCOSE 92 03/25/2021 1015   BUN 20 03/25/2021 1015   CREATININE 0.90 03/25/2021 1015   CALCIUM 9.6 03/25/2021 1015   PROT 7.1 03/25/2021 1015   ALBUMIN 4.0 03/25/2021 1015   AST 25 03/25/2021 1015   ALT 20 03/25/2021 1015   ALKPHOS 57 03/25/2021 1015   BILITOT 0.7 03/25/2021 1015   GFRNONAA >60 03/25/2021 1015   GFRAA >60 08/27/2020 1025     CBC    Component Value Date/Time   WBC 5.4 03/25/2021 1015   RBC 4.20 03/25/2021 1015   HGB 13.3 03/25/2021 1015   HCT 40.5 03/25/2021 1015   PLT 212 03/25/2021 1015   MCV 96.4 03/25/2021 1015   MCH 31.7 03/25/2021 1015   MCHC 32.8 03/25/2021 1015   RDW 12.9 03/25/2021 1015   LYMPHSABS 1.5 03/25/2021 1015   MONOABS 0.5 03/25/2021 1015   EOSABS 0.2 03/25/2021 1015   BASOSABS 0.0 03/25/2021 1015     Diabetic Labs (most recent): No results found for: HGBA1C  Lipid Panel  No results found for: CHOL, TRIG, HDL, CHOLHDL, VLDL, LDLCALC, LDLDIRECT, LABVLDL   Lab  Results  Component Value Date   TSH 0.847 07/05/2021   TSH 0.045 (L) 04/08/2021   TSH 0.10 (A) 03/22/2021   TSH 1.31 03/19/2020  FREET4 1.43 07/05/2021   FREET4 1.72 04/08/2021     Results for Crystal [name], Crystal [name] (MRN [id number]) as of 04/15/2021 08:29  Ref. Range 04/08/2021 14:31  TSH Latest Ref Range: 0.450 - 4.500 uIU/mL 0.045 (L)  Triiodothyronine,Free,Serum Latest Ref Range: 2.0 - 4.4 pg/mL 3.5  T4,Free(Direct) Latest Ref Range: 0.82 - 1.77 ng/dL 1.72  Thyroperoxidase Ab SerPl-aCnc Latest Ref Range: 0 - 34 IU/mL <8  Thyroglobulin Antibody Latest Ref Range: 0.0 - 0.9 IU/mL <1.0    Thyroid Ultrasound from 02/22/21 CLINICAL DATA:  Palpable abnormality.  Palpable thyroid nodule.   EXAM: THYROID ULTRASOUND   TECHNIQUE: Ultrasound examination of the thyroid gland and adjacent soft tissues was performed.   COMPARISON:  Chest CT-07/20/2020   FINDINGS: Parenchymal Echotexture: Markedly heterogenous   Isthmus: Enlarged measuring 1.1 cm in diameter   Right lobe: Enlarged measuring 8.6 x 3.4 x 3.6 cm   Left lobe: Enlarged measuring 6.1 x 3.9 x 3.6 cm   _________________________________________________________   Estimated total number of nodules >/= 1 cm: 1   Number of spongiform nodules >/=  2 cm not described below (TR1): 0   Number of mixed cystic and solid nodules >/= 1.5 cm not described below (TR2): 0   _________________________________________________________   Questioned 3.9 x 3.8 x 3.2 cm isoechoic ill-defined nodule/mass within the left lobe of the thyroid is favored to represent a pseudonodule as it lacks defined borders on both provided transverse and sagittal images, likely accentuated due to marked heterogeneity of the thyroid parenchymal echotexture.   IMPRESSION: Enlarged and markedly heterogeneous thyroid without discrete worrisome nodule or mass.   The above is in keeping with the ACR TI-RADS recommendations - J Am Coll Radiol 2017;14:587-595.      Electronically Signed   By: Sandi Mariscal M.D.   On: 02/22/2021 11:47 ---------------------------------------------------------------------------------------------------------------------------  UPTAKE AND SCAN RESULTS FROM 05/05/21 CLINICAL DATA:  Suppressed TSH, suspected hyperthyroidism   EXAM: THYROID SCAN AND UPTAKE - 4 AND 24 HOURS   TECHNIQUE: Following oral administration of I-123 capsule, anterior planar imaging was acquired at 24 hours. Thyroid uptake was calculated with a thyroid probe at 4-6 hours and 24 hours.   RADIOPHARMACEUTICALS:  446 uCi I-123 sodium iodide p.o.   COMPARISON:  None   Correlation: Thyroid ultrasound 02/22/2021   FINDINGS: Heterogeneous tracer uptake throughout both thyroid lobes. Areas of cold nodules are seen at the upper pole and lower pole LEFT lobe as well as lower pole RIGHT lobe. Small foci of focally increased tracer uptake at isthmus and RIGHT upper pole. Findings are consistent with a multinodular thyroid gland. No discrete thyroid nodules identified at the sites on ultrasound.   4 hour I-123 uptake = 7.8% (normal 5-20%)   24 hour I-123 uptake = 25.9% (normal 10-30%)   IMPRESSION: Enlarged multinodular thyroid gland.   Normal 4 hour and 24 hour radio iodine uptakes.     Electronically Signed   By: Lavonia Dana M.D.   On: 05/05/2021 11:51   Results for Crystal [name], Crystal [name] (MRN [id number]) as of 07/12/2021 09:47  Ref. Range 07/05/2021 10:21  TSH Latest Ref Range: 0.450 - 4.500 uIU/mL 0.847  Triiodothyronine,Free,Serum Latest Ref Range: 2.0 - 4.4 pg/mL 3.0  T4,Free(Direct) Latest Ref Range: 0.82 - 1.77 ng/dL 1.43     Assessment & Plan:   1. Subclinical hyperthyroidism  she is being seen at a kind request of Asencion Noble, MD.  her history and most recent labs are reviewed, and she was examined clinically.  Her uptake and scan results showed grossly enlarged thyroid, no hot or cold nodules, and normal uptake at 4 hr (7.8%) and  24 hrs (25.9%).   Her repeat thyroid function tests are all WNL without any intervention.     -Her symptoms are stable at this time, and although she does have an enlarged thyroid, she is not a good surgical candidate due to her age and comorbidities. I do not feel she needs antithyroid medication at this time.  Will repeat thyroid function tests in 6 months.      -Patient is advised to maintain close follow up with Asencion Noble, MD for primary care needs.      I spent 20 minutes dedicated to the care of this patient on the date of this encounter to include pre-visit review of records, face-to-face time with the patient, and post visit ordering of  testing.  Crystal [name]  participated in the discussions, expressed understanding, and voiced agreement with the above plans.  All questions were answered to her satisfaction. she is encouraged to contact clinic should she have any questions or concerns prior to her return visit.    Follow up plan: Return in about 6 months (around 01/12/2022) for Thyroid follow up, Previsit labs, Virtual visit ok.   Thank you for involving me in the care of this pleasant patient, and I will continue to update you with her progress.   Rayetta Pigg, [facility] [address] [place], [place] [address] Phone: [phone] Fax: [phone]   07/12/2021, 9:51 AM

## 2021-07-13 ENCOUNTER — Encounter: Payer: Self-pay | Admitting: Allergy & Immunology

## 2021-07-13 ENCOUNTER — Other Ambulatory Visit: Payer: Self-pay | Admitting: *Deleted

## 2021-07-13 MED ORDER — BUDESONIDE-FORMOTEROL FUMARATE 160-4.5 MCG/ACT IN AERO: 2.0000 | INHALATION_SPRAY | Freq: Two times a day (BID) | RESPIRATORY_TRACT | 5 refills | Status: DC

## 2021-07-14 ENCOUNTER — Other Ambulatory Visit: Payer: Self-pay | Admitting: *Deleted

## 2021-07-14 MED ORDER — IPRATROPIUM BROMIDE 0.03 % NA SOLN: 2.0000 | Freq: Three times a day (TID) | NASAL | 1 refills | Status: DC

## 2021-07-20 ENCOUNTER — Other Ambulatory Visit: Payer: Self-pay | Admitting: *Deleted

## 2021-07-20 ENCOUNTER — Encounter: Payer: Self-pay | Admitting: Allergy & Immunology

## 2021-07-20 MED ORDER — ALBUTEROL SULFATE HFA 108 (90 BASE) MCG/ACT IN AERS: 4.0000 | INHALATION_SPRAY | Freq: Four times a day (QID) | RESPIRATORY_TRACT | 1 refills | Status: DC | PRN

## 2021-07-22 ENCOUNTER — Other Ambulatory Visit: Payer: Self-pay | Admitting: *Deleted

## 2021-07-22 MED ORDER — ALBUTEROL SULFATE HFA 108 (90 BASE) MCG/ACT IN AERS: 2.0000 | INHALATION_SPRAY | RESPIRATORY_TRACT | 1 refills | Status: DC | PRN

## 2021-07-25 DIAGNOSIS — I10 Essential (primary) hypertension: Secondary | ICD-10-CM | POA: Diagnosis not present

## 2021-07-25 DIAGNOSIS — K219 Gastro-esophageal reflux disease without esophagitis: Secondary | ICD-10-CM | POA: Diagnosis not present

## 2021-07-25 DIAGNOSIS — E785 Hyperlipidemia, unspecified: Secondary | ICD-10-CM | POA: Diagnosis not present

## 2021-08-05 DIAGNOSIS — G4733 Obstructive sleep apnea (adult) (pediatric): Secondary | ICD-10-CM | POA: Diagnosis not present

## 2021-08-12 ENCOUNTER — Other Ambulatory Visit: Payer: Self-pay | Admitting: Primary Care

## 2021-09-06 ENCOUNTER — Other Ambulatory Visit: Payer: Self-pay | Admitting: Primary Care

## 2021-10-10 ENCOUNTER — Other Ambulatory Visit: Payer: Self-pay | Admitting: Allergy & Immunology

## 2021-10-25 DIAGNOSIS — K219 Gastro-esophageal reflux disease without esophagitis: Secondary | ICD-10-CM | POA: Diagnosis not present

## 2021-10-25 DIAGNOSIS — I10 Essential (primary) hypertension: Secondary | ICD-10-CM | POA: Diagnosis not present

## 2021-10-25 DIAGNOSIS — E785 Hyperlipidemia, unspecified: Secondary | ICD-10-CM | POA: Diagnosis not present

## 2021-10-31 ENCOUNTER — Encounter (HOSPITAL_COMMUNITY): Payer: Self-pay | Admitting: *Deleted

## 2021-10-31 ENCOUNTER — Emergency Department (HOSPITAL_COMMUNITY)
Admission: EM | Admit: 2021-10-31 | Discharge: 2021-10-31 | Disposition: A | Discharge status: Home / Self Care | Payer: Medicare Other | Attending: Emergency Medicine | Admitting: Emergency Medicine

## 2021-10-31 ENCOUNTER — Emergency Department (HOSPITAL_COMMUNITY): Payer: Medicare Other

## 2021-10-31 DIAGNOSIS — W19XXXA Unspecified fall, initial encounter: Secondary | ICD-10-CM | POA: Insufficient documentation

## 2021-10-31 DIAGNOSIS — S5002XA Contusion of left elbow, initial encounter: Secondary | ICD-10-CM | POA: Insufficient documentation

## 2021-10-31 DIAGNOSIS — Z79899 Other long term (current) drug therapy: Secondary | ICD-10-CM | POA: Insufficient documentation

## 2021-10-31 DIAGNOSIS — M79632 Pain in left forearm: Secondary | ICD-10-CM | POA: Diagnosis not present

## 2021-10-31 DIAGNOSIS — Y92009 Unspecified place in unspecified non-institutional (private) residence as the place of occurrence of the external cause: Secondary | ICD-10-CM | POA: Insufficient documentation

## 2021-10-31 DIAGNOSIS — Y9301 Activity, walking, marching and hiking: Secondary | ICD-10-CM | POA: Insufficient documentation

## 2021-10-31 DIAGNOSIS — Z853 Personal history of malignant neoplasm of breast: Secondary | ICD-10-CM | POA: Insufficient documentation

## 2021-10-31 DIAGNOSIS — I10 Essential (primary) hypertension: Secondary | ICD-10-CM | POA: Diagnosis not present

## 2021-10-31 DIAGNOSIS — S6992XA Unspecified injury of left wrist, hand and finger(s), initial encounter: Secondary | ICD-10-CM | POA: Diagnosis present

## 2021-10-31 MED ORDER — NAPROXEN 500 MG PO TABS: 500.0000 mg | ORAL_TABLET | Freq: Two times a day (BID) | ORAL | 0 refills | Status: DC

## 2021-10-31 NOTE — ED Triage Notes (Signed)
Fell today, swelling left elbow

## 2021-10-31 NOTE — ED Provider Notes (Signed)
[facility] EMERGENCY DEPARTMENT Provider Note   CSN: [id number] Arrival date & time: 10/31/21  1518     History Chief Complaint  Patient presents with   Elbow Pain    Crystal [name] is a 80 y.o. female.  HPI  This patient is an 80 year old female, she has a history of multiple medical problems but ultimately states that she has had poor balance for approximately 1 year.  During the last year she is had the occasional time where she is having difficulty with her balance and that occurred today while she was walking down her hallway at home, she caught her foot on a rubber stopper that was on the foot and this caused her to lose her balance and fall striking her right shoulder on a doorway and her left elbow on the other side of the doorway as she fell to the ground.  The injury to her left elbow actually occurred when she flung her arm to try to stabilize her self striking the edge of the door jam.  This occurred just prior to arrival, the injury was directly over the olecranon process on the left elbow, she has had pain swelling and bruising since that time which seems to be worse with palpation but not particularly bad with range of motion.  She has no numbness or tingling of the hand.  No medications given prior to arrival.  Past Medical History:  Diagnosis Date   Anxiety    Arthritis    Breast cancer (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   Last colonoscopy/bx by Dr Rourk->(08/31/07) descending colon   Depression    Diverticulosis    Enlarged thyroid    Gait abnormality 10/18/2017   GERD (gastroesophageal reflux disease)    Gout    HTN (hypertension)    Hypercholesteremia    Lumbar spinal stenosis 11/07/2017   Severe at L4-5, moderate at L3-4   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   OA (osteoarthritis)    Obesity    PONV (postoperative nausea and vomiting)    Ptosis of eyelid, left    Sleep apnea    cpap   Spinal stenosis    Tubular  adenoma of colon 08/2007    Patient Active Problem List   Diagnosis Date Noted   Chronic cough 02/16/2021   Nodule of neck 02/16/2021   GERD (gastroesophageal reflux disease) 02/16/2021   Lumbar spinal stenosis 11/07/2017   Gait abnormality 10/18/2017   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   Osteopenia determined by x-ray 07/23/2015   Breast cancer ([place]) 03/31/2015   Colitis, ischemic ([place]) 06/27/2011   Tubular adenoma 06/27/2011    Past Surgical History:  Procedure Laterality Date   ABDOMINAL HYSTERECTOMY     partial   BREAST CYST EXCISION     benign, left x2, right x1   CATARACT EXTRACTION W/PHACO  12/01/2011   Procedure: CATARACT EXTRACTION PHACO AND INTRAOCULAR LENS PLACEMENT ([place]);  Surgeon: Tonny Branch;  Location: [facility];  Service: Ophthalmology;  Laterality: Right;  CDE=14.87   CATARACT EXTRACTION W/PHACO  02/06/2012   Procedure: CATARACT EXTRACTION PHACO AND INTRAOCULAR LENS PLACEMENT ([facility]);  Surgeon: Tonny Branch, MD;  Location: [facility];  Service: Ophthalmology;  Laterality: Left;  CDE 13.00   COLONOSCOPY  Sept 2008   Last colonoscopy/bx by Dr Rourk->(08/31/07) descending colon TUBULAR ADENOMA   COLONOSCOPY  10/25/2012   Procedure: COLONOSCOPY;  Surgeon: Daneil Dolin, MD;  Location: [facility];  Service: Endoscopy;  Laterality: N/A;  10:15-changed to 10:30 Leigh Ann notified   PARTIAL MASTECTOMY WITH NEEDLE LOCALIZATION AND AXILLARY SENTINEL LYMPH NODE BX Right 03/18/2015   Procedure: PARTIAL MASTECTOMY AFTER NEEDLE LOCALIZATION AND AXILLARY SENTINEL LYMPH NODE BX;  Surgeon: Aviva Signs Md, MD;  Location: [facility];  Service: General;  Laterality: Right;   PTOSIS REPAIR Left 05/11/2017   Procedure: INTERNAL PTOSIS REPAIR OF LEFT EYELID;  Surgeon: Clista Bernhardt, MD;  Location: [place];  Service: Ophthalmology;  Laterality: Left;   TUBAL LIGATION  1970   hysterectomy   TUBAL LIGATION       OB History   No obstetric history on file.     Family History  Problem Relation Age of  Onset   Cancer Brother 74   Coronary artery disease Brother    Diabetes Sister    Heart disease Father    Heart disease Brother    Anesthesia problems Neg Hx    Hypotension Neg Hx    Pseudochol deficiency Neg Hx    Malignant hyperthermia Neg Hx    Colon cancer Neg Hx     Social History   Tobacco Use   Smoking status: Never   Smokeless tobacco: Never  Vaping Use   Vaping Use: Never used  Substance Use Topics   Alcohol use: No   Drug use: No    Home Medications Prior to Admission medications   Medication Sig Start Date End Date Taking? Authorizing Provider  naproxen (NAPROSYN) 500 MG tablet Take 1 tablet (500 mg total) by mouth 2 (two) times daily with a meal. 10/31/21  Yes Noemi Chapel, MD  acetaminophen (TYLENOL) 650 MG CR tablet Take 1,300 mg by mouth 2 (two) times daily.    [provider]  albuterol (PROAIR HFA) 108 (90 Base) MCG/ACT inhaler Inhale 2 puffs into the lungs every 4 (four) hours as needed for wheezing or shortness of breath. 07/22/21   Althea Charon, FNP  albuterol (VENTOLIN HFA) 108 (90 Base) MCG/ACT inhaler Inhale 4 puffs into the lungs every 6 (six) hours as needed for wheezing or shortness of breath. 07/20/21   Valentina Shaggy, MD  amLODipine (NORVASC) 10 MG tablet Take 10 mg by mouth daily.    [provider]  Artificial Tear Solution (SOOTHE XP OP) Place 1 drop into both eyes 2 (two) times daily.    [provider]  budesonide-formoterol (SYMBICORT) 160-4.5 MCG/ACT inhaler Inhale 2 puffs into the lungs 2 (two) times daily. in the morning and at bedtime. 07/13/21   Mannam, Hart Robinsons, MD  buPROPion (WELLBUTRIN SR) 150 MG 12 hr tablet Take 150 mg by mouth 2 (two) times daily. 06/02/11   [provider]  Calcium-Vitamin D (CALTRATE 600 PLUS-VIT D PO) Take 1 tablet by mouth daily.    [provider]  chlorthalidone (HYGROTON) 25 MG tablet Take 25 mg by mouth every morning. 01/28/21   [provider]  docusate  sodium (COLACE) 100 MG capsule Take 100 mg by mouth 2 (two) times daily.    [provider]  FLUoxetine (PROZAC) 20 MG capsule Take 20 mg by mouth daily. 06/23/11   [provider]  ipratropium (ATROVENT) 0.03 % nasal spray USE 2 SPRAYS IN BOTH  NOSTRILS 3 TIMES DAILY 10/11/21   Valentina Shaggy, MD  metoprolol succinate (TOPROL-XL) 25 MG 24 hr tablet Take 25 mg by mouth daily.    [provider]  montelukast (SINGULAIR) 10 MG tablet TAKE 1 TABLET(10 MG) BY MOUTH AT BEDTIME 09/07/21  Martyn Ehrich, NP  pantoprazole (PROTONIX) 40 MG tablet TAKE 1 TABLET BY MOUTH TWICE DAILY Patient taking differently: 40 mg 2 (two) times daily. 12/14/15   Mannam, Hart Robinsons, MD  predniSONE (DELTASONE) 10 MG tablet Take two tablets (20mg ) twice daily for three days, then one tablet (10mg ) twice daily for three days, then STOP. 05/13/21   Valentina Shaggy, MD  simvastatin (ZOCOR) 10 MG tablet Take 10 mg by mouth at bedtime.     [provider]    Allergies    Bactrim [sulfamethoxazole-trimethoprim]  Review of Systems   Review of Systems  Musculoskeletal:  Positive for arthralgias.  Skin:  Positive for color change. Negative for wound.  Neurological:  Negative for weakness and numbness.   Physical Exam Updated Vital Signs BP (!) 144/66 (BP Location: Right Arm)   Pulse 66   Temp 98 F (36.7 C) (Oral)   Resp 14   SpO2 95%   Physical Exam Vitals and nursing note reviewed.  Constitutional:      Appearance: She is well-developed. She is not diaphoretic.  HENT:     Head: Normocephalic and atraumatic.  Eyes:     General:        Right eye: No discharge.        Left eye: No discharge.     Conjunctiva/sclera: Conjunctivae normal.  Pulmonary:     Effort: Pulmonary effort is normal. No respiratory distress.  Musculoskeletal:        General: Swelling, tenderness and signs of injury present. No deformity.     Right lower leg: No edema.     Left lower leg: No  edema.     Comments: The patient is able to fully flex and extend the left elbow, she can pronate and supinate without any difficulty, she does have some swelling and tenderness with bruising over the olecranon bursa of the left elbow, there is no lacerations no warmth and appears to be a focal injury.  All other joints are supple and all other compartments are soft  Skin:    General: Skin is warm and dry.     Findings: Bruising present. No erythema or rash.  Neurological:     Mental Status: She is alert.     Coordination: Coordination normal.     Comments: Awake alert and able to follow commands, she is able to move all 4 extremities without difficulty to command, cranial nerves III through XII appear normal  Psychiatric:        Mood and Affect: Mood normal.    ED Results / Procedures / Treatments   Labs (all labs ordered are listed, but only abnormal results are displayed) Labs Reviewed - No data to display  EKG None  Radiology DG Forearm Left  Result Date: 10/31/2021 CLINICAL DATA:  Fall.  Arm pain EXAM: LEFT FOREARM - 2 VIEW COMPARISON:  None. FINDINGS: Negative for fracture. Multiple soft tissue rounded calcification in the subcutaneous tissue most compatible with phleboliths dorsally. IMPRESSION: Negative for fracture. Electronically Signed   By: Franchot Gallo M.D.   On: 10/31/2021 16:37    Procedures Procedures   Medications Ordered in ED Medications - No data to display  ED Course  I have reviewed the triage vital signs and the nursing notes.  Pertinent labs & imaging results that were available during my care of the patient were reviewed by me and considered in my medical decision making (see chart for details).    MDM Rules/Calculators/A&P  I have personally viewed the x-rays, there is no signs of fracture of the ulna or the radial head, there is no distal humeral injuries, there is no joint effusion, she has a clinical exam that is  consistent with having a bursal contusion and hematoma, at this time I think it is reasonable to treat with an anti-inflammatory ice pack and home.  The patient is agreeable and can follow-up with orthopedics.  Final Clinical Impression(s) / ED Diagnoses Final diagnoses:  Contusion of left elbow, initial encounter    Rx / DC Orders ED Discharge Orders          Ordered    naproxen (NAPROSYN) 500 MG tablet  2 times daily with meals        10/31/21 1652             Noemi Chapel, MD 10/31/21 1652

## 2021-10-31 NOTE — Discharge Instructions (Addendum)
Your x-ray shows no broken bones, this is all swelling of the little fluid-filled sac over your elbow, please follow-up with your doctor or the orthopedic surgeon Dr. Aline Brochure who is listed above within 1 week if still hurting, you may take Naprosyn twice daily for pain and use ice packs on and off to help with some of the swelling

## 2021-11-08 DIAGNOSIS — Z23 Encounter for immunization: Secondary | ICD-10-CM | POA: Diagnosis not present

## 2021-11-24 DIAGNOSIS — E785 Hyperlipidemia, unspecified: Secondary | ICD-10-CM | POA: Diagnosis not present

## 2021-11-24 DIAGNOSIS — I10 Essential (primary) hypertension: Secondary | ICD-10-CM | POA: Diagnosis not present

## 2021-11-24 DIAGNOSIS — K219 Gastro-esophageal reflux disease without esophagitis: Secondary | ICD-10-CM | POA: Diagnosis not present

## 2021-12-14 HISTORY — PX: TOOTH EXTRACTION: SUR596

## 2021-12-24 DIAGNOSIS — I10 Essential (primary) hypertension: Secondary | ICD-10-CM | POA: Diagnosis not present

## 2021-12-24 DIAGNOSIS — K219 Gastro-esophageal reflux disease without esophagitis: Secondary | ICD-10-CM | POA: Diagnosis not present

## 2021-12-24 DIAGNOSIS — E785 Hyperlipidemia, unspecified: Secondary | ICD-10-CM | POA: Diagnosis not present

## 2022-01-04 DIAGNOSIS — E0590 Thyrotoxicosis, unspecified without thyrotoxic crisis or storm: Secondary | ICD-10-CM | POA: Diagnosis not present

## 2022-01-05 LAB — T4, FREE: Free T4: 1.48 ng/dL (ref 0.82–1.77)

## 2022-01-05 LAB — T3, FREE: T3, Free: 3.2 pg/mL (ref 2.0–4.4)

## 2022-01-05 LAB — TSH: TSH: 0.801 u[IU]/mL (ref 0.450–4.500)

## 2022-01-12 ENCOUNTER — Other Ambulatory Visit: Payer: Self-pay

## 2022-01-12 ENCOUNTER — Encounter: Payer: Self-pay | Admitting: Nurse Practitioner

## 2022-01-12 ENCOUNTER — Telehealth (INDEPENDENT_AMBULATORY_CARE_PROVIDER_SITE_OTHER): Payer: Medicare Other | Admitting: Nurse Practitioner

## 2022-01-12 VITALS — BP 142/75 | HR 69 | Ht 63.0 in | Wt 222.0 lb

## 2022-01-12 DIAGNOSIS — E0590 Thyrotoxicosis, unspecified without thyrotoxic crisis or storm: Secondary | ICD-10-CM | POA: Diagnosis not present

## 2022-01-12 NOTE — Progress Notes (Signed)
01/12/2022     Endocrinology Follow Up Note    TELEHEALTH VISIT: The patient is being engaged in telehealth visit.  This type of visit limits physical examination significantly, and thus is not preferable over face-to-face encounters.  I connected with  Crystal [name] on 01/12/22 by a video enabled telemedicine application and verified that I am speaking with the correct person using two identifiers.   I discussed the limitations of evaluation and management by telemedicine. The patient expressed understanding and agreed to proceed.    The participants involved in this visit include: Brita Romp, NP located at [facility] and Crystal [name]  located at their personal residence listed.   Subjective:    Patient ID: Crystal [name], female    DOB: 02/05/1941, PCP Asencion Noble, MD.   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   OA (osteoarthritis)    Obesity    PONV (postoperative nausea and vomiting)    Ptosis of eyelid, left    Sleep apnea    cpap   Spinal stenosis    Tubular adenoma of colon 08/2007    Past Surgical History:  Procedure Laterality Date   ABDOMINAL HYSTERECTOMY     partial   BREAST CYST EXCISION     benign, left x2, right x1   CATARACT EXTRACTION W/PHACO  12/01/2011   Procedure: CATARACT EXTRACTION PHACO AND INTRAOCULAR LENS PLACEMENT ([facility]);  Surgeon: Tonny Branch;  Location: [facility];  Service: Ophthalmology;  Laterality: Right;  CDE=14.87   CATARACT EXTRACTION  W/PHACO  02/06/2012   Procedure: CATARACT EXTRACTION PHACO AND INTRAOCULAR LENS PLACEMENT ([facility]);  Surgeon: Tonny Branch, MD;  Location: [facility];  Service: Ophthalmology;  Laterality: Left;  CDE 13.00   COLONOSCOPY  08/2007   Last colonoscopy/bx by Dr Rourk->(08/31/07) descending colon TUBULAR ADENOMA   COLONOSCOPY  10/25/2012   Procedure: COLONOSCOPY;  Surgeon: Daneil Dolin, MD;  Location: [facility];  Service: Endoscopy;  Laterality: N/A;  10:15-changed to 10:30 Leigh Ann notified   PARTIAL MASTECTOMY WITH NEEDLE LOCALIZATION AND AXILLARY SENTINEL LYMPH NODE BX Right 03/18/2015   Procedure: PARTIAL MASTECTOMY AFTER NEEDLE LOCALIZATION AND AXILLARY SENTINEL LYMPH NODE BX;  Surgeon: Aviva Signs Md, MD;  Location: [facility];  Service: General;  Laterality: Right;   PTOSIS REPAIR Left 05/11/2017   Procedure: INTERNAL PTOSIS REPAIR OF LEFT EYELID;  Surgeon: Clista Bernhardt, MD;  Location: [facility];  Service: Ophthalmology;  Laterality: Left;   TOOTH EXTRACTION  12/14/2021   TUBAL LIGATION  1970   hysterectomy   TUBAL LIGATION      Social History   Socioeconomic History   Marital status: Widowed    Spouse name: Not on file   Number of children: 3   Years of education: 1   Highest education level: Not on file  Occupational History   Occupation: Lab Tech-retired  Tobacco Use   Smoking status: Never   Smokeless  tobacco: Never  Vaping Use   Vaping Use: Never used  Substance and Sexual Activity   Alcohol use: No   Drug use: No   Sexual activity: Not Currently    Birth control/protection: Surgical  Other Topics Concern   Not on file  Social History Narrative   Lives alone   Caffeine use: less than 8 oz per week   Right handed    Social Determinants of Health   Financial Resource Strain: Not on file  Food Insecurity: Not on file  Transportation Needs: Not on file  Physical Activity: Not on file  Stress: Not on file  Social Connections: Not on file    Family History  Problem  Relation Age of Onset   Cancer Brother 43   Coronary artery disease Brother    Diabetes Sister    Heart disease Father    Heart disease Brother    Anesthesia problems Neg Hx    Hypotension Neg Hx    Pseudochol deficiency Neg Hx    Malignant hyperthermia Neg Hx    Colon cancer Neg Hx     Outpatient Encounter Medications as of 01/12/2022  Medication Sig   acetaminophen (TYLENOL) 650 MG CR tablet Take 1,300 mg by mouth 2 (two) times daily.   albuterol (PROAIR HFA) 108 (90 Base) MCG/ACT inhaler Inhale 2 puffs into the lungs every 4 (four) hours as needed for wheezing or shortness of breath.   albuterol (VENTOLIN HFA) 108 (90 Base) MCG/ACT inhaler Inhale 4 puffs into the lungs every 6 (six) hours as needed for wheezing or shortness of breath.   amLODipine (NORVASC) 10 MG tablet Take 10 mg by mouth daily.   Artificial Tear Solution (SOOTHE XP OP) Place 1 drop into both eyes 2 (two) times daily.   budesonide-formoterol (SYMBICORT) 160-4.5 MCG/ACT inhaler Inhale 2 puffs into the lungs 2 (two) times daily. in the morning and at bedtime.   buPROPion (WELLBUTRIN SR) 150 MG 12 hr tablet Take 150 mg by mouth 2 (two) times daily.   Calcium-Vitamin D (CALTRATE 600 PLUS-VIT D PO) Take 1 tablet by mouth daily.   chlorthalidone (HYGROTON) 25 MG tablet Take 25 mg by mouth every morning.   FLUoxetine (PROZAC) 20 MG capsule Take 20 mg by mouth daily.   ipratropium (ATROVENT) 0.03 % nasal spray USE 2 SPRAYS IN BOTH  NOSTRILS 3 TIMES DAILY   metoprolol succinate (TOPROL-XL) 25 MG 24 hr tablet Take 25 mg by mouth daily.   montelukast (SINGULAIR) 10 MG tablet TAKE 1 TABLET(10 MG) BY MOUTH AT BEDTIME   pantoprazole (PROTONIX) 40 MG tablet TAKE 1 TABLET BY MOUTH TWICE DAILY (Patient taking differently: 40 mg 2 (two) times daily.)   simvastatin (ZOCOR) 10 MG tablet Take 10 mg by mouth at bedtime.    [DISCONTINUED] docusate sodium (COLACE) 100 MG capsule Take 100 mg by mouth 2 (two) times daily. (Patient not taking:  Reported on 01/12/2022)   [DISCONTINUED] naproxen (NAPROSYN) 500 MG tablet Take 1 tablet (500 mg total) by mouth 2 (two) times daily with a meal. (Patient not taking: Reported on 01/12/2022)   [DISCONTINUED] predniSONE (DELTASONE) 10 MG tablet Take two tablets (20mg ) twice daily for three days, then one tablet (10mg ) twice daily for three days, then STOP. (Patient not taking: Reported on 01/12/2022)   No facility-administered encounter medications on file as of 01/12/2022.    ALLERGIES: Allergies  Allergen Reactions   Bactrim [Sulfamethoxazole-Trimethoprim] Nausea Only    VACCINATION STATUS: Immunization History  Administered Date(s) Administered  Influenza, High Dose Seasonal PF 10/23/2018   Influenza-Unspecified 09/10/2015   Moderna Sars-Covid-2 Vaccination 02/19/2020, 03/18/2020, 12/10/2020   Pneumococcal Conjugate-13 12/25/2014     HPI  TATIONA STECH is 81 y.o. female who presents today with a medical history as above. she is being seen in follow up after being seen in consultation for hyperthyroidism requested by Asencion Noble, MD.  she has been dealing with symptoms of chronic cough, intermittent trouble sleeping for month/years. These symptoms are progressively worsening and troubling to her.  her most recent thyroid labs revealed suppressed TSH of 0.098 on 03/22/21.  She reports trouble breathing when she goes to the dentist and lays back with mouth open.  She gets her thyroid labs done every year and this is the first time anything was abnormal. she denies dysphagia, choking, shortness of breath, no recent voice change.    she reports family history of thyroid dysfunction in her mother with significant goiter, but denies family hx of thyroid cancer. she denies personal history of goiter. she is not on any anti-thyroid medications nor on any thyroid hormone supplements. Denies use of Biotin containing supplements.  she is willing to proceed with appropriate work up and therapy for  thyrotoxicosis.   Review of systems  Constitutional: + Minimally fluctuating body weight,  current Body mass index is 39.33 kg/m. , no fatigue, no subjective hyperthermia, no subjective hypothermia Eyes: no blurry vision, no xerophthalmia ENT: no sore throat, no nodules palpated in throat, no dysphagia/odynophagia, no hoarseness Cardiovascular: no chest pain, no shortness of breath, no palpitations, no leg swelling Respiratory: + intermittent nagging cough, no shortness of breath Gastrointestinal: no nausea/vomiting/diarrhea Musculoskeletal: no muscle/joint aches Skin: no rashes, no hyperemia Neurological: no tremors, no numbness, no tingling, no dizziness Psychiatric: no depression, no anxiety   Objective:    BP (!) 142/75    Pulse 69    Ht 5\' 3"  (1.6 m)    Wt 222 lb (100.7 kg)    BMI 39.33 kg/m   Wt Readings from Last 3 Encounters:  01/12/22 222 lb (100.7 kg)  05/11/21 217 lb 12.8 oz (98.8 kg)  04/21/21 220 lb (99.8 kg)     BP Readings from Last 3 Encounters:  01/12/22 (!) 142/75  10/31/21 (!) 144/66  05/11/21 (!) 160/81    Physical Exam- Telehealth- significantly limited due to nature of visit  Constitutional: Body mass index is 39.33 kg/m. , not in acute distress, normal state of mind Respiratory: Adequate breathing efforts    CMP     Component Value Date/Time   NA 139 03/25/2021 1015   K 3.4 (L) 03/25/2021 1015   CL 100 03/25/2021 1015   CO2 29 03/25/2021 1015   GLUCOSE 92 03/25/2021 1015   BUN 20 03/25/2021 1015   CREATININE 0.90 03/25/2021 1015   CALCIUM 9.6 03/25/2021 1015   PROT 7.1 03/25/2021 1015   ALBUMIN 4.0 03/25/2021 1015   AST 25 03/25/2021 1015   ALT 20 03/25/2021 1015   ALKPHOS 57 03/25/2021 1015   BILITOT 0.7 03/25/2021 1015   GFRNONAA >60 03/25/2021 1015   GFRAA >60 08/27/2020 1025     CBC    Component Value Date/Time   WBC 5.4 03/25/2021 1015   RBC 4.20 03/25/2021 1015   HGB 13.3 03/25/2021 1015   HCT 40.5 03/25/2021 1015    PLT 212 03/25/2021 1015   MCV 96.4 03/25/2021 1015   MCH 31.7 03/25/2021 1015   MCHC 32.8 03/25/2021 1015   RDW 12.9 03/25/2021 1015  LYMPHSABS 1.5 03/25/2021 1015   MONOABS 0.5 03/25/2021 1015   EOSABS 0.2 03/25/2021 1015   BASOSABS 0.0 03/25/2021 1015     Diabetic Labs (most recent): No results found for: HGBA1C  Lipid Panel  No results found for: CHOL, TRIG, HDL, CHOLHDL, VLDL, LDLCALC, LDLDIRECT, LABVLDL   Lab Results  Component Value Date   TSH 0.801 01/04/2022   TSH 0.847 07/05/2021   TSH 0.045 (L) 04/08/2021   TSH 0.10 (A) 03/22/2021   TSH 1.31 03/19/2020   FREET4 1.48 01/04/2022   FREET4 1.43 07/05/2021   FREET4 1.72 04/08/2021     Results for CHINMAYI, RUMER (MRN [id number]) as of 04/15/2021 08:29  Ref. Range 04/08/2021 14:31  TSH Latest Ref Range: 0.450 - 4.500 uIU/mL 0.045 (L)  Triiodothyronine,Free,Serum Latest Ref Range: 2.0 - 4.4 pg/mL 3.5  T4,Free(Direct) Latest Ref Range: 0.82 - 1.77 ng/dL 1.72  Thyroperoxidase Ab SerPl-aCnc Latest Ref Range: 0 - 34 IU/mL <8  Thyroglobulin Antibody Latest Ref Range: 0.0 - 0.9 IU/mL <1.0    Thyroid Ultrasound from 02/22/21 CLINICAL DATA:  Palpable abnormality.  Palpable thyroid nodule.   EXAM: THYROID ULTRASOUND   TECHNIQUE: Ultrasound examination of the thyroid gland and adjacent soft tissues was performed.   COMPARISON:  Chest CT-07/20/2020   FINDINGS: Parenchymal Echotexture: Markedly heterogenous   Isthmus: Enlarged measuring 1.1 cm in diameter   Right lobe: Enlarged measuring 8.6 x 3.4 x 3.6 cm   Left lobe: Enlarged measuring 6.1 x 3.9 x 3.6 cm   _________________________________________________________   Estimated total number of nodules >/= 1 cm: 1   Number of spongiform nodules >/=  2 cm not described below (TR1): 0   Number of mixed cystic and solid nodules >/= 1.5 cm not described below (TR2): 0  _________________________________________________________   Questioned 3.9 x 3.8 x 3.2 cm  isoechoic ill-defined nodule/mass within the left lobe of the thyroid is favored to represent a pseudonodule as it lacks defined borders on both provided transverse and sagittal images, likely accentuated due to marked heterogeneity of the thyroid parenchymal echotexture.   IMPRESSION: Enlarged and markedly heterogeneous thyroid without discrete worrisome nodule or mass.   The above is in keeping with the ACR TI-RADS recommendations - J Am Coll Radiol 2017;14:587-595.     Electronically Signed   By: Sandi Mariscal M.D.   On: 02/22/2021 11:47 ---------------------------------------------------------------------------------------------------------------------------  UPTAKE AND SCAN RESULTS FROM 05/05/21 CLINICAL DATA:  Suppressed TSH, suspected hyperthyroidism   EXAM: THYROID SCAN AND UPTAKE - 4 AND 24 HOURS   TECHNIQUE: Following oral administration of I-123 capsule, anterior planar imaging was acquired at 24 hours. Thyroid uptake was calculated with a thyroid probe at 4-6 hours and 24 hours.   RADIOPHARMACEUTICALS:  446 uCi I-123 sodium iodide p.o.   COMPARISON:  None   Correlation: Thyroid ultrasound 02/22/2021   FINDINGS: Heterogeneous tracer uptake throughout both thyroid lobes. Areas of cold nodules are seen at the upper pole and lower pole LEFT lobe as well as lower pole RIGHT lobe. Small foci of focally increased tracer uptake at isthmus and RIGHT upper pole. Findings are consistent with a multinodular thyroid gland. No discrete thyroid nodules identified at the sites on ultrasound.   4 hour I-123 uptake = 7.8% (normal 5-20%)   24 hour I-123 uptake = 25.9% (normal 10-30%)   IMPRESSION: Enlarged multinodular thyroid gland.   Normal 4 hour and 24 hour radio iodine uptakes.     Electronically Signed   By: Lavonia Dana M.D.   On:  05/05/2021 11:51   Results for BRYONY, KAMAN (MRN [id number]) as of 07/12/2021 09:47  Ref. Range 07/05/2021 10:21  TSH Latest Ref  Range: 0.450 - 4.500 uIU/mL 0.847  Triiodothyronine,Free,Serum Latest Ref Range: 2.0 - 4.4 pg/mL 3.0  T4,Free(Direct) Latest Ref Range: 0.82 - 1.77 ng/dL 1.43     Latest Reference Range & Units 07/05/21 10:21 01/04/22 08:21  TSH 0.450 - 4.500 uIU/mL 0.847 0.801  Triiodothyronine,Free,Serum 2.0 - 4.4 pg/mL 3.0 3.2  T4,Free(Direct) 0.82 - 1.77 ng/dL 1.43 1.48   Assessment & Plan:   1. Subclinical hyperthyroidism-resolved  she is being seen at a kind request of Asencion Noble, MD.  her history and most recent labs are reviewed, and she was examined clinically.    Her uptake and scan results showed grossly enlarged thyroid, no hot or cold nodules, and normal uptake at 4 hr (7.8%) and 24 hrs (25.9%).   Her thyroid ultrasound shows enlarged thyroid, however given her age and comorbidities, surgery is not an ideal option for her.  Conservative management is recommended at this time.  Her repeat thyroid function tests are normal.  Given her stable thyroid function tests, she can go back to annual surveillance at this time.    -Patient is advised to maintain close follow up with Asencion Noble, MD for primary care needs.      I spent 20 minutes dedicated to the care of this patient on the date of this encounter to include pre-visit review of records, face-to-face time with the patient, and post visit ordering of  testing.  Crystal [name]  participated in the discussions, expressed understanding, and voiced agreement with the above plans.  All questions were answered to her satisfaction. she is encouraged to contact clinic should she have any questions or concerns prior to her return visit.    Follow up plan: Return in about 1 year (around 01/12/2023) for Thyroid follow up, Previsit labs.   Thank you for involving me in the care of this pleasant patient, and I will continue to update you with her progress.   Rayetta Pigg, [facility] [address] [place], [place] [address] Phone: [phone] Fax: [phone]   01/12/2022, 9:13 AM

## 2022-01-23 DIAGNOSIS — I10 Essential (primary) hypertension: Secondary | ICD-10-CM | POA: Diagnosis not present

## 2022-01-23 DIAGNOSIS — E785 Hyperlipidemia, unspecified: Secondary | ICD-10-CM | POA: Diagnosis not present

## 2022-01-23 DIAGNOSIS — K219 Gastro-esophageal reflux disease without esophagitis: Secondary | ICD-10-CM | POA: Diagnosis not present

## 2022-02-22 DIAGNOSIS — I10 Essential (primary) hypertension: Secondary | ICD-10-CM | POA: Diagnosis not present

## 2022-02-22 DIAGNOSIS — E785 Hyperlipidemia, unspecified: Secondary | ICD-10-CM | POA: Diagnosis not present

## 2022-02-22 DIAGNOSIS — K219 Gastro-esophageal reflux disease without esophagitis: Secondary | ICD-10-CM | POA: Diagnosis not present

## 2022-03-02 ENCOUNTER — Other Ambulatory Visit: Payer: Self-pay | Admitting: Pulmonary Disease

## 2022-03-17 ENCOUNTER — Other Ambulatory Visit: Payer: Self-pay | Admitting: *Deleted

## 2022-03-17 MED ORDER — MONTELUKAST SODIUM 10 MG PO TABS: ORAL_TABLET | ORAL | 3 refills | Status: DC

## 2022-03-25 DIAGNOSIS — I10 Essential (primary) hypertension: Secondary | ICD-10-CM | POA: Diagnosis not present

## 2022-03-25 DIAGNOSIS — E785 Hyperlipidemia, unspecified: Secondary | ICD-10-CM | POA: Diagnosis not present

## 2022-04-12 ENCOUNTER — Encounter: Payer: Self-pay | Admitting: Internal Medicine

## 2022-04-12 ENCOUNTER — Ambulatory Visit: Payer: Medicare Other | Admitting: Internal Medicine

## 2022-04-12 DIAGNOSIS — R053 Chronic cough: Secondary | ICD-10-CM | POA: Diagnosis not present

## 2022-04-12 DIAGNOSIS — R0609 Other forms of dyspnea: Secondary | ICD-10-CM | POA: Insufficient documentation

## 2022-04-12 MED ORDER — BENZONATATE 200 MG PO CAPS: 200.0000 mg | ORAL_CAPSULE | Freq: Three times a day (TID) | ORAL | 2 refills | Status: DC | PRN

## 2022-04-12 NOTE — Progress Notes (Addendum)
? ?Crystal [name], female    DOB: Oct 22, 1941,   MRN: [id number] ? ? ?Brief patient profile:  ?66  yowf  never smoker referred to pulmonary clinic in [place]  04/12/2022 by Dr Isaiah Serge for daytime sporadic dry cough x 25 years p starting cpap  but worse more severe  x spirng 2022. ? ?Allergy eval by Dr Dellis Anes pos for cat with pos eos 200-400, offered biologics at lat ov 03/24/21  ? ? ?History of Present Illness  ?04/12/2022  Pulmonary/ 1st office eval/ Dorma Altman / [place] Office maint on symb 160 with acceptable technique s apparent response  ?Chief Complaint  ?Patient presents with  ? New Patient (Initial Visit)  ?  Prev. Patient of Dr. Isaiah Serge.   ?Dyspnea:  rollator limited by energy > doe  ?Cough: dry hack typically doesn't wake up/ sleeps in recliner due back x years ?Sometimes cc dysphagia not consistent and does not correlate with cough  ?Sleep: no longer using cpap and feels like sleeps fine ?SABA use: not helping  ?Prednisone seems to help  ?Seems to correlate with nasal congestion  ?Onset this cough was one week prior to OV  ?  ? ?No obvious patterns discernable in day to day or daytime variability or assoc excess/ purulent sputum or mucus plugs or hemoptysis or cp or chest tightness, subjective wheeze or overt sinus or hb symptoms.  ? ?Sleeping  without nocturnal  or early am exacerbation  of respiratory  c/o's or need for noct saba. Also denies any obvious fluctuation of symptoms with weather or environmental changes or other aggravating or alleviating factors except as outlined above  ? ?No unusual exposure hx or h/o childhood pna/ asthma or knowledge of premature birth. ? ?Current Allergies, Complete Past Medical History, Past Surgical History, Family History, and Social History were reviewed in [employer] record. ? ?ROS  The following are not active complaints unless bolded ?Hoarseness, sore throat, dysphagia, dental problems, itching, sneezing,  nasal congestion or discharge of  excess mucus or purulent secretions, ear ache,   fever, chills, sweats, unintended wt loss or wt gain, classically pleuritic or exertional cp,  orthopnea pnd or arm/hand swelling  or leg swelling, presyncope, palpitations, abdominal pain, anorexia, nausea, vomiting, diarrhea  or change in bowel habits or change in bladder habits, change in stools or change in urine, dysuria, hematuria,  rash, arthralgias, visual complaints, headache, numbness, weakness or ataxia or problems with walking or coordination,  change in mood or  memory. ?      ?   ? ? ?Past Medical History:  ?Diagnosis Date  ? Anxiety   ? Arthritis   ? Breast cancer ([facility]) 03/21/15  ? right  ? Chronic fatigue   ? Colitis, ischemic ([facility]) 08/2007, 05/2011  ? Last colonoscopy/bx by Dr Rourk->(08/31/07) descending colon  ? Depression   ? Diverticulosis   ? Enlarged thyroid   ? Gait abnormality 10/18/2017  ? GERD (gastroesophageal reflux disease)   ? Gout   ? HTN (hypertension)   ? Hypercholesteremia   ? Lumbar spinal stenosis 11/07/2017  ? Severe at L4-5, moderate at L3-4  ? 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 OA (osteoarthritis)   ? Obesity   ? PONV (postoperative nausea and vomiting)   ? Ptosis of eyelid, left   ? Sleep apnea   ? cpap  ? Spinal stenosis   ? Tubular adenoma of colon 08/2007  ? ? ?Outpatient Medications Prior to Visit  ?Medication Sig Dispense Refill  ? acetaminophen (  TYLENOL) 650 MG CR tablet Take 1,300 mg by mouth 2 (two) times daily.    ? albuterol (PROAIR HFA) 108 (90 Base) MCG/ACT inhaler Inhale 2 puffs into the lungs every 4 (four) hours as needed for wheezing or shortness of breath. 54 g 1  ? amLODipine (NORVASC) 10 MG tablet Take 10 mg by mouth daily.    ? Artificial Tear Solution (SOOTHE XP OP) Place 1 drop into both eyes 2 (two) times daily.    ? budesonide-formoterol (SYMBICORT) 160-4.5 MCG/ACT inhaler Inhale 2 puffs into the lungs 2 (two) times daily. in the morning and at bedtime. 10.2 g 5  ? buPROPion (WELLBUTRIN SR) 150 MG 12 hr tablet Take  150 mg by mouth 2 (two) times daily.    ? Calcium-Vitamin D (CALTRATE 600 PLUS-VIT D PO) Take 1 tablet by mouth daily.    ? chlorthalidone (HYGROTON) 25 MG tablet Take 25 mg by mouth every morning.    ? FLUoxetine (PROZAC) 20 MG capsule Take 20 mg by mouth daily.    ? ipratropium (ATROVENT) 0.03 % nasal spray USE 2 SPRAYS IN BOTH  NOSTRILS 3 TIMES DAILY 120 mL 1  ? metoprolol succinate (TOPROL-XL) 25 MG 24 hr tablet Take 25 mg by mouth daily.    ? montelukast (SINGULAIR) 10 MG tablet TAKE 1 TABLET(10 MG) BY MOUTH AT BEDTIME 90 tablet 3  ? pantoprazole (PROTONIX) 40 MG tablet TAKE 1 TABLET BY MOUTH TWICE DAILY (Patient taking differently: 40 mg 2 (two) times daily.) 60 tablet 5  ? simvastatin (ZOCOR) 10 MG tablet Take 10 mg by mouth at bedtime.     ? albuterol (VENTOLIN HFA) 108 (90 Base) MCG/ACT inhaler Inhale 4 puffs into the lungs every 6 (six) hours as needed for wheezing or shortness of breath. 54 g 1  ? ?No facility-administered medications prior to visit.  ? ? ? ?Objective:  ?  ? ?BP 134/74 (BP Location: Left Arm, Patient Position: Sitting)   Pulse 74   Temp 98.7 ?F (37.1 ?C) (Temporal)   Ht 5\' 3"  (1.6 m)   Wt 225 lb 6.4 oz (102.2 kg)   SpO2 98% Comment: ra  BMI 39.93 kg/m?  ? ?SpO2: 98 % (ra) ? ?Wt Readings from Last 3 Encounters:  ?04/12/22 225 lb 6.4 oz (102.2 kg)  ?01/12/22 222 lb (100.7 kg)  ?05/11/21 217 lb 12.8 oz (98.8 kg)  ?  ?General appearance:    ? ?Obese wf unsteady on her feet and can't get up on exam table even with 2 person assist  ? ? HEENT : no cobblstoning/pnd/ nl turbinates   ? ? ?NECK :  without JVD/Nodes/TM/ nl carotid upstrokes bilaterally ? ? ?LUNGS: no acc muscle use,  Nl contour chest which is clear to A and P bilaterally without cough on insp or exp maneuvers ? ? ?CV:  RRR  no s3 or murmur or increase in P2, and no edema  ? ?ABD:  soft and nontender with nl inspiratory excursion in the supine position. No bruits or organomegaly appreciated, bowel sounds nl ? ?MS:  walks with  rollator /  ext warm without deformities, calf tenderness, cyanosis or clubbing ?No obvious joint restrictions  ? ?SKIN: warm and dry without lesions   ? ?NEURO:  alert, approp, nl sensorium with  no motor or cerebellar deficits apparent.  ? ? ? ? ?   ?Assessment  ? ?No problem-specific Assessment & Plan notes found for this encounter. ? ? ? ? ?Sandrea Hughs, MD ?04/12/2022 ?   ?

## 2022-04-12 NOTE — Patient Instructions (Addendum)
Protonix (pantoprazole) 40 mg Take 30- 60 min before your first and last meals of the day  ? ?GERD (REFLUX)  is an extremely common cause of respiratory symptoms just like yours , many times with no obvious heartburn at all.  ? ? It can be treated with medication, but also with lifestyle changes including  , avoidance of late meals, excessive alcohol, and avoid fatty foods, chocolate, peppermint, colas, red wine, and acidic juices such as orange juice.  ?NO MINT OR MENTHOL PRODUCTS SO NO COUGH DROPS  ?USE SUGARLESS CANDY INSTEAD (Jolley ranchers or Stover's or Life Savers) or even ice chips will also do - the key is to swallow to prevent all throat clearing. ?NO OIL BASED VITAMINS - use powdered substitutes.  Avoid fish oil when coughing.  ? ?Stop all your inhalers except albuterol which you can use as follows ?Only use your albuterol as a rescue medication to be used if you can't catch your breath by resting or doing a relaxed purse lip breathing pattern.  ?- The less you use it, the better it will work when you need it. ?- Ok to use up to 2 puffs  every 4 hours if you must but if you need more, restart symbicort 160 2 puffs every 12 hours  ? ?Ok to try albuterol 15 min before an activity (on alternating days)  that you know would usually make you short of breath and see if it makes any difference and if makes none then don't take albuterol after activity unless you can't catch your breath as this means it's the resting that helps, not the albuterol. ?    ? ?For cough > tessalon pearls 200 mg every 6 hours prn  ? ?Please schedule a follow up office visit in 6 weeks, call sooner if needed  ? ? ? ?

## 2022-04-12 NOTE — Assessment & Plan Note (Addendum)
Onset sporadic cough x 2000 ?- Allergy eval by Dr Ernst Bowler pos for cat with pos eos 200-400, offered biologics at lat ov 03/24/21  ?- multiple pfts thru 04/11/21 wnl ?- 04/12/2022  After extensive coaching inhaler device,  effectiveness =    80% hfa > try off symbicort and just use prn saba  ? 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 ?Upper airway cough syndrome (previously labeled PNDS),  is so named because it's frequently impossible to sort out how much is  CR/sinusitis with freq throat clearing (which can be related to primary GERD)   vs  causing  secondary (" extra esophageal")  GERD from wide swings in gastric pressure that occur with throat clearing, often  promoting self use of mint and menthol lozenges that reduce the lower esophageal sphincter tone and exacerbate the problem further in a cyclical fashion.  ? ?These are the same pts (now being labeled as having "irritable larynx syndrome" by some cough centers) who not infrequently have a history of having failed to tolerate ace inhibitors,  dry powder inhalers (or high dose ICS in any form)  or biphosphonates or report having atypical/extraesophageal reflux symptoms that don't respond to standard doses of PPI  and are easily confused as having aecopd or asthma flares by even experienced allergists/ pulmonologists (myself included).  ? ?rec  ?[facility] rx for gerd/ cough suppression with tessilon and reverse therapeutic trial off symbicort with caveat to restart if saba use goes up off ICS ? ?Advised: ?The standardized cough guidelines published in Chest by Lissa Morales in 2006 are still the best available and consist of a multiple step process (up to 12!) , not a single office visit,  and are intended  to address this problem logically,  with an alogrithm dependent on response to empiric treatment at  each progressive step  to determine a specific diagnosis with  minimal addtional testing needed. Therefore if adherence is an issue or can't  be accurately verified,  it's very unlikely the standard evaluation and treatment will be successful here.   ? ?Furthermore, response to therapy (other than acute cough suppression, which should only be used short term with avoidance of narcotic containing cough syrups if possible), can be a gradual process for which the patient is not likely to  perceive immediate benefit. ? ?Unlike going to an eye doctor where the best perscription is almost always the first one and is immediately effective, this is almost never the case in the management of chronic cough syndromes. Therefore the patient needs to commit up front to consistently adhere to recommendations  for up to 6 weeks of therapy directed at the likely underlying problem(s) before the response can be reasonably evaluated.  ? ?F/u in 6 weeks with all meds in hand using a trust but verify approach to confirm accurate Medication  Reconciliation The principal here is that until we are certain that the  patients are doing what we've asked, it makes no sense to ask them to do more.  ? ? ?    ?  ?  ?

## 2022-04-12 NOTE — Assessment & Plan Note (Signed)
04/12/2022   Walked on RA  x  one  lap(s) =  approx 150  ft  @ slow pace, stopped due to being tired  with lowest 02 sats 96%  ? ?Unclear whether or not saba or symbicort really helping either c/o cough or doe ? ? Re SABA :  I spent extra time with pt today reviewing appropriate use of albuterol for prn use on exertion with the following points: ?1) saba is for relief of sob that does not improve by walking a slower pace or resting but rather if the pt does not improve after trying this first. ?2) If the pt is convinced, as many are, that saba helps recover from activity faster then it's easy to tell if this is the case by re-challenging : ie stop, take the inhaler, then p 5 minutes try the exact same activity (intensity of workload) that just caused the symptoms and see if they are substantially diminished or not after saba ?3) if there is an activity that reproducibly causes the symptoms, try the saba 15 min before the activity on alternate days  ? ?If in fact the saba really does help, then fine to continue to use it prn but advised may need to look closer at the maintenance regimen being used to achieve better control of airways disease with exertion.  ? ?I don't believe this is asthma or any lung problem limiting activity tol but more likely obesity and cognitive/generiactric decline and don't plan additional w/u at this point and will focus on the cough going forward.  ? ?Each maintenance medication was reviewed in detail including emphasizing most importantly the difference between maintenance and prns and under what circumstances the prns are to be triggered using an action plan format where appropriate. ? ?Total time for H and P, chart review, counseling, reviewing hfa device(s) , directly observing portions of ambulatory 02 saturation study/ and generating customized AVS unique to this office visit / same day charting > 40 min with pt new to me ?     ?  ?      ?

## 2022-04-24 DIAGNOSIS — E785 Hyperlipidemia, unspecified: Secondary | ICD-10-CM | POA: Diagnosis not present

## 2022-04-24 DIAGNOSIS — K219 Gastro-esophageal reflux disease without esophagitis: Secondary | ICD-10-CM | POA: Diagnosis not present

## 2022-04-24 DIAGNOSIS — I10 Essential (primary) hypertension: Secondary | ICD-10-CM | POA: Diagnosis not present

## 2022-04-28 ENCOUNTER — Other Ambulatory Visit (HOSPITAL_COMMUNITY): Payer: Self-pay | Admitting: Internal Medicine

## 2022-04-28 DIAGNOSIS — Z1231 Encounter for screening mammogram for malignant neoplasm of breast: Secondary | ICD-10-CM

## 2022-05-04 ENCOUNTER — Ambulatory Visit (HOSPITAL_COMMUNITY)
Admission: RE | Admit: 2022-05-04 | Discharge: 2022-05-04 | Disposition: A | Discharge status: Home / Self Care | Payer: Medicare Other | Source: Ambulatory Visit | Attending: Internal Medicine | Admitting: Internal Medicine

## 2022-05-04 DIAGNOSIS — Z1231 Encounter for screening mammogram for malignant neoplasm of breast: Secondary | ICD-10-CM | POA: Diagnosis not present

## 2022-05-10 ENCOUNTER — Encounter: Payer: Self-pay | Admitting: Internal Medicine

## 2022-05-10 NOTE — Telephone Encounter (Signed)
Good morning.  I wanted to let you know that my Mother, [name], did not have a cough about 2 weeks after her visit with you.  It has now returned (pretty much like normal, off for 2 weeks and on again).  She sees you again on May 30th but wanted to see if there is anything she can take before the visit? ?  ?Thank you ? ? ?Dr. Melvyn Novas please advise  ?

## 2022-05-10 NOTE — Telephone Encounter (Signed)
Sorry to hear that - all I can offer is to go back to the instructions and follow them to the letter until returns. ? ?This includes tessalon pearls if needs refills (or any other refills)  ? ?If breathing getting worse or albuterol need going up then restart symbicort.  ? ?Be sure to return with all meds and inhalers to regroup  ?

## 2022-05-18 MED ORDER — BUDESONIDE-FORMOTEROL FUMARATE 160-4.5 MCG/ACT IN AERO: 2.0000 | INHALATION_SPRAY | Freq: Two times a day (BID) | RESPIRATORY_TRACT | 1 refills | Status: DC

## 2022-05-18 NOTE — Addendum Note (Signed)
Addended by: Fritzi Mandes D on: 05/18/2022 08:07 AM   Modules accepted: Orders

## 2022-05-24 ENCOUNTER — Encounter: Payer: Self-pay | Admitting: Internal Medicine

## 2022-05-24 ENCOUNTER — Ambulatory Visit: Payer: Medicare Other | Admitting: Internal Medicine

## 2022-05-24 DIAGNOSIS — R053 Chronic cough: Secondary | ICD-10-CM

## 2022-05-24 MED ORDER — BUDESONIDE-FORMOTEROL FUMARATE 80-4.5 MCG/ACT IN AERO: 2.0000 | INHALATION_SPRAY | Freq: Two times a day (BID) | RESPIRATORY_TRACT | 3 refills | Status: DC

## 2022-05-24 MED ORDER — BENZONATATE 200 MG PO CAPS
200.0000 mg | ORAL_CAPSULE | Freq: Three times a day (TID) | ORAL | 2 refills | Status: DC | PRN
Start: 2022-05-24 — End: 2022-05-24

## 2022-05-24 MED ORDER — BENZONATATE 200 MG PO CAPS: 200.0000 mg | ORAL_CAPSULE | Freq: Three times a day (TID) | ORAL | 3 refills | Status: DC | PRN

## 2022-05-24 MED ORDER — BUDESONIDE-FORMOTEROL FUMARATE 80-4.5 MCG/ACT IN AERO: INHALATION_SPRAY | RESPIRATORY_TRACT | 2 refills | Status: DC

## 2022-05-24 NOTE — Progress Notes (Signed)
[name], female    DOB: [birth year],   MRN: [id number]   Brief patient profile:  15   yowf  never smoker referred to pulmonary clinic in [place]  04/12/2022 by Dr Isaiah Serge for daytime sporadic dry cough x 25 years p starting cpap  but worse more severe  x spirng 2022.  Allergy eval by Dr Dellis Anes pos for cat with pos eos 200-400, offered biologics at lat ov 03/24/21    History of Present Illness  04/12/2022  Pulmonary/ 1st office eval/ [name] / [place] [facility] maint on symb 160 with acceptable technique s apparent response  Chief Complaint  Patient presents with   New Patient (Initial Visit)    Prev. Patient of Dr. Isaiah Serge.   Dyspnea:  rollator limited by energy > doe  Cough: dry hack typically doesn't wake up/ sleeps in recliner due back x years Sometimes cc dysphagia not consistent and does not correlate with cough  Sleep: no longer using cpap and feels like sleeps fine SABA use: not helping  Prednisone seems to help  Seems to correlate with nasal congestion  Onset this cough was one week prior to OV  Rec Protonix (pantoprazole) 40 mg Take 30- 60 min before your first and last meals of the day   GERD diet reviewed, bed blocks rec  Stop all your inhalers except albuterol which you can use as follows Only use your albuterol as a rescue medication  Ok to try albuterol 15 min before an activity (on alternating days)  that you know would usually make you short of breath   For cough > tessalon pearls 200 mg every 6 hours prn     05/24/2022  f/u ov/[place] [facility]/[name] re: gerd rx  maint on gerd rx and ? Symb 160 (confused with instructions)   Chief Complaint  Patient presents with   Follow-up    Coughing has improved- has been coming and going.  Would like symbicort and [facility]on perles [facility].   Dyspnea:  rollator one aisle and legs give out /poor balance not  doe limiting  Cough: better / tessalon helps some  Sleeping: 45  degrees x 3 y  and  off cpap   sleeps better s daytime hypersomnolence SABA use: didn't  help but not clear she understood instructions at all  02: none  Covid status: vax x 3    No obvious day to day or daytime variability or assoc excess/ purulent sputum or mucus plugs or hemoptysis or cp or chest tightness, subjective wheeze or overt sinus or hb symptoms.   Sleeping  without nocturnal  or early am exacerbation  of respiratory  c/o's or need for noct saba. Also denies any obvious fluctuation of symptoms with weather or environmental changes or other aggravating or alleviating factors except as outlined above   No unusual exposure hx or h/o childhood pna/ asthma or knowledge of premature birth.  Current Allergies, Complete Past Medical History, Past Surgical History, Family History, and Social History were reviewed in [employer] record.  ROS  The following are not active complaints unless bolded Hoarseness, sore throat, dysphagia, dental problems, itching, sneezing,  nasal congestion or discharge of excess mucus or purulent secretions, ear ache,   fever, chills, sweats, unintended wt loss or wt gain, classically pleuritic or exertional cp,  orthopnea pnd or arm/hand swelling  or leg swelling improving  presyncope, palpitations, abdominal pain, anorexia, nausea, vomiting, diarrhea  or change in bowel  habits or change in bladder habits, change in stools or change in urine, dysuria, hematuria,  rash, arthralgias, visual complaints, headache, numbness, weakness or ataxia or problems with walking or coordination,  change in mood or  memory.        Current Meds  Medication Sig   acetaminophen (TYLENOL) 650 MG CR tablet Take 1,300 mg by mouth 2 (two) times daily.   albuterol (PROAIR HFA) 108 (90 Base) MCG/ACT inhaler Inhale 2 puffs into the lungs every 4 (four) hours as needed for wheezing or shortness of breath.   amLODipine (NORVASC) 10 MG tablet Take 10 mg by mouth daily.   Artificial Tear  Solution (SOOTHE XP OP) Place 1 drop into both eyes 2 (two) times daily.   benzonatate (TESSALON) 200 MG capsule Take 1 capsule (200 mg total) by mouth 3 (three) times daily as needed for cough.   budesonide-formoterol (SYMBICORT) 160-4.5 MCG/ACT inhaler Inhale 2 puffs into the lungs 2 (two) times daily. in the morning and at bedtime.   buPROPion (WELLBUTRIN SR) 150 MG 12 hr tablet Take 150 mg by mouth 2 (two) times daily.   Calcium-Vitamin D (CALTRATE 600 PLUS-VIT D PO) Take 1 tablet by mouth daily.   chlorthalidone (HYGROTON) 25 MG tablet Take 25 mg by mouth every morning.   FLUoxetine (PROZAC) 20 MG capsule Take 20 mg by mouth daily.   ipratropium (ATROVENT) 0.03 % nasal spray USE 2 SPRAYS IN BOTH  NOSTRILS 3 TIMES DAILY   metoprolol succinate (TOPROL-XL) 25 MG 24 hr tablet Take 25 mg by mouth daily.   montelukast (SINGULAIR) 10 MG tablet TAKE 1 TABLET(10 MG) BY MOUTH AT BEDTIME   pantoprazole (PROTONIX) 40 MG tablet TAKE 1 TABLET BY MOUTH TWICE DAILY (Patient taking differently: 40 mg 2 (two) times daily.)   simvastatin (ZOCOR) 10 MG tablet Take 10 mg by mouth at bedtime.             Past Medical History:  Diagnosis Date   Anxiety    Arthritis    Breast cancer (HCC) 03/21/15   right   Chronic fatigue    Colitis, ischemic (HCC) 08/2007, 05/2011   Last colonoscopy/bx by Dr Rourk->(08/31/07) descending colon   Depression    Diverticulosis    Enlarged thyroid    Gait abnormality 10/18/2017   GERD (gastroesophageal reflux disease)    Gout    HTN (hypertension)    Hypercholesteremia    Lumbar spinal stenosis 11/07/2017   Severe at L4-5, moderate at L3-4   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   OA (osteoarthritis)    Obesity    PONV (postoperative nausea and vomiting)    Ptosis of eyelid, left    Sleep apnea    cpap   Spinal stenosis    Tubular adenoma of colon 08/2007        Objective:      wts  05/24/2022       225   04/12/22 225 lb 6.4 oz (102.2 kg)  01/12/22 222 lb (100.7 kg)   05/11/21 217 lb 12.8 oz (98.8 kg)    Vital signs reviewed  05/24/2022  - Note at rest 02 sats  97% on RA   General appearance:    obese jovial wf nad    HEENT : Oropharynx  clear  Nasal turbintes nl    NECK :  without  appent JVD/ palpable Nodes/TM    LUNGS: no acc muscle use,  Nl contour chest which is clear to A and P bilaterally without cough  on insp or exp maneuvers   CV:  RRR  no s3 or murmur or increase in P2, and no edema   ABD:  obese soft and nontender    MS:  Rollator gait ext warm without deformities Or obvious joint restrictions  calf tenderness, cyanosis or clubbing     SKIN: warm and dry without lesions    NEURO:  alert, approp, nl sensorium with  no motor or cerebellar deficits apparent.            Assessment

## 2022-05-24 NOTE — Patient Instructions (Addendum)
Once your symbiocrt 160 is used up please change to  symbicort to 80 Take 2 puffs first thing in am and then another 2 puffs about 12 hours later.    Work on maintaining perfect  inhaler technique:  relax and gently blow all the way out then take a nice smooth full deep breath back in, triggering the inhaler at same time you start breathing in.  Hold for up to 5 seconds if you can. Blow out thru nose. Rinse and gargle with water when done.  If mouth or throat bother you at all,  try brushing teeth/gums/tongue with arm and hammer toothpaste/ make a slurry and gargle and spit out.     For breathing >>> Only use your albuterol as a rescue medication to be used if you can't catch your breath by resting or doing a relaxed purse lip breathing pattern.  - The less you use it, the better it will work when you need it. - Ok to use up to 2 puffs  every 4 hours if you must but call for immediate appointment if use goes up over your usual need - Don't leave home without it !!  (think of it like the spare tire for your car)    For cough >>>   use tessalon 200 mg every 6 -8 hours  only if  needed     Please schedule a follow up visit in 2  months

## 2022-05-24 NOTE — Assessment & Plan Note (Signed)
Onset sporadic cough x 2000 - Allergy eval by Dr Ernst Bowler pos for cat with pos eos 200-400, offered biologics at last ov 03/24/21  - multiple pfts thru 04/11/21 wnl  - 04/12/2022  After extensive coaching inhaler device,  effectiveness =    80% hfa > try off symbicort and just use prn saba > did not follow instructions, continued to use symbicort 160 2bid but not consistently   Advised for cough can't use symbicort 160 as "comes and goes s pattern x years" and rx for tessilon was as needed, not as a maint rx (she confused the maint vs the prn here)   rec instead symbicort  160 2 bid until uses up  This rx then symb 80 2bid until return and just use tessalon prn cough and the albuterol prn doe   Re SABA :  I spent extra time with pt today reviewing appropriate use of albuterol for prn use on exertion with the following points: 1) saba is for relief of sob that does not improve by walking a slower pace or resting but rather if the pt does not improve after trying this first. 2) If the pt is convinced, as many are, that saba helps recover from activity faster then it's easy to tell if this is the case by re-challenging : ie stop, take the inhaler, then p 5 minutes try the exact same activity (intensity of workload) that just caused the symptoms and see if they are substantially diminished or not after saba 3) if there is an activity that reproducibly causes the symptoms, try the saba 15 min before the activity on alternate days   If in fact the saba really does help, then fine to continue to use it prn but advised may need to look closer at the maintenance regimen being used to achieve better control of airways disease with exertion.

## 2022-05-25 DIAGNOSIS — K219 Gastro-esophageal reflux disease without esophagitis: Secondary | ICD-10-CM | POA: Diagnosis not present

## 2022-05-25 DIAGNOSIS — E785 Hyperlipidemia, unspecified: Secondary | ICD-10-CM | POA: Diagnosis not present

## 2022-05-25 DIAGNOSIS — I10 Essential (primary) hypertension: Secondary | ICD-10-CM | POA: Diagnosis not present

## 2022-05-26 ENCOUNTER — Other Ambulatory Visit: Payer: Self-pay | Admitting: Allergy & Immunology

## 2022-05-28 ENCOUNTER — Other Ambulatory Visit: Payer: Self-pay | Admitting: Allergy & Immunology

## 2022-06-06 DIAGNOSIS — E785 Hyperlipidemia, unspecified: Secondary | ICD-10-CM | POA: Diagnosis not present

## 2022-06-06 DIAGNOSIS — E0501 Thyrotoxicosis with diffuse goiter with thyrotoxic crisis or storm: Secondary | ICD-10-CM | POA: Diagnosis not present

## 2022-06-06 DIAGNOSIS — I10 Essential (primary) hypertension: Secondary | ICD-10-CM | POA: Diagnosis not present

## 2022-06-06 DIAGNOSIS — I700 Atherosclerosis of aorta: Secondary | ICD-10-CM | POA: Diagnosis not present

## 2022-06-08 ENCOUNTER — Encounter: Payer: Self-pay | Admitting: Internal Medicine

## 2022-06-08 NOTE — Telephone Encounter (Signed)
loria H Grullon  P [facility] (supporting Tanda Rockers, MD) 14 minutes ago (9:19 AM)    Good morning, I am confirming if my Mother, Crystal [name], needs to continue taking the nasal spray, ipratropium, she wasn't sure?    Dr. Melvyn Novas please advise. I may have overlooked but did not see any specific instructions in patients last ov note regarding atrovent nasal spray.

## 2022-06-13 DIAGNOSIS — Z853 Personal history of malignant neoplasm of breast: Secondary | ICD-10-CM | POA: Diagnosis not present

## 2022-06-13 DIAGNOSIS — I10 Essential (primary) hypertension: Secondary | ICD-10-CM | POA: Diagnosis not present

## 2022-06-13 DIAGNOSIS — G4733 Obstructive sleep apnea (adult) (pediatric): Secondary | ICD-10-CM | POA: Diagnosis not present

## 2022-06-13 DIAGNOSIS — I700 Atherosclerosis of aorta: Secondary | ICD-10-CM | POA: Diagnosis not present

## 2022-07-25 ENCOUNTER — Encounter: Payer: Self-pay | Admitting: Internal Medicine

## 2022-07-25 ENCOUNTER — Ambulatory Visit: Payer: Medicare Other | Admitting: Internal Medicine

## 2022-07-25 DIAGNOSIS — R053 Chronic cough: Secondary | ICD-10-CM

## 2022-07-25 MED ORDER — BUDESONIDE-FORMOTEROL FUMARATE 160-4.5 MCG/ACT IN AERO: INHALATION_SPRAY | RESPIRATORY_TRACT | 3 refills | Status: DC

## 2022-07-25 MED ORDER — METHYLPREDNISOLONE ACETATE 80 MG/ML IJ SUSP
120.0000 mg | Freq: Once | INTRAMUSCULAR | Status: AC
  Administered 2022-07-25: 120 mg via INTRAMUSCULAR

## 2022-07-25 NOTE — Patient Instructions (Signed)
Change back symbicort to 160 Take 2 puffs first thing in am and then another 2 puffs about 12 hours later.    For drainage / throat tickle try take CHLORPHENIRAMINE  4 mg  ("Allergy Relief" '4mg'$   at [facility] should be easiest to find in the blue box usually on bottom shelf)  take one every 4 hours as needed - extremely effective and inexpensive over the counter- may cause drowsiness so start with just a dose or two an hour before bedtime and see how you tolerate it before trying in daytime.   Depomedrol 120 mg IM should work for about a week and if a lot better then worse again, please see Dr Ernst Bowler   Please schedule a follow up office visit in 6 weeks, call sooner if needed

## 2022-07-25 NOTE — Progress Notes (Signed)
Crystal [name], female    DOB: [birth year],   MRN: [id number]   Brief patient profile:  71   yowf  never smoker referred to pulmonary clinic in [name]  04/12/2022 by Dr [name] for daytime sporadic dry cough x 25 years p starting cpap  but worse more severe  x spirng 2022.  Allergy eval by Dr Crystal [name] pos for cat with pos eos 200-400, offered biologics at lat ov 03/24/21    History of Present Illness  04/12/2022  Pulmonary/ 1st office eval/ [name] / [place] [name] maint on symb 160 with acceptable technique s apparent response  Chief Complaint  Patient presents with   New Patient (Initial Visit)    Prev. Patient of Dr. Isaiah [name].   Dyspnea:  rollator limited by energy > doe  Cough: dry hack typically doesn't wake up/ sleeps in recliner due back x years Sometimes cc dysphagia not consistent and does not correlate with cough  Sleep: no longer using cpap and feels like sleeps fine SABA use: not helping  Prednisone seems to help  Seems to correlate with nasal congestion  Onset this cough was one week prior to OV  Rec Protonix (pantoprazole) 40 mg Take 30- 60 min before your first and last meals of the day   GERD diet reviewed, bed blocks rec  Stop all your inhalers except albuterol which you can use as follows Only use your albuterol as a rescue medication  Ok to try albuterol 15 min before an activity (on alternating days)  that you know would usually make you short of breath   For cough > tessalon pearls 200 mg every 6 hours prn     05/24/2022  f/u ov/[name] [facility]/[name] re: gerd rx  maint on gerd rx and ? Symb 160 (confused with instructions)   Chief Complaint  Patient presents with   Follow-up    Coughing has improved- has been coming and going.  Would like symbicort and t[facility]alon perles [facility] with [facility].   Dyspnea:  rollator one aisle and legs give out /poor balance not  doe limiting  Cough: better / tessalon helps some  Sleeping: 45  degrees x 3 y  and  off cpap   sleeps better s daytime hypersomnolence SABA use: didn't  help but not clear she understood instructions at all  02: none  Covid status: vax x 3  Rec Once your symbicort 160 is used up please change to  symbicort to 80 Take 2 puffs first thing in am and then another 2 puffs about 12 hours later.  Work on maintaining perfect  inhaler technique:   For breathing >>> Only use your albuterol as a rescue medication For cough >>>   use tessalon 200 mg every 6 -8 hours  only if  needed       07/25/2022  f/u ov/[place] [name]/[name] re: chronic cough x decades maint on symb 80 2bid / singulair /gerd rx   Chief Complaint  Patient presents with   Follow-up    Cough  is coming back needs clarification on symbicort  Dyspnea: gets let off at door due to fatigue > sob  Cough: worse on the 80 strength of symbicort  with increased need for tessalon  Sleeping: having to sit up recliner higher also since lowered dose of symb SABA use: not using  02: none    No obvious patterns in day to day or daytime variability or assoc excess/ purulent sputum or mucus plugs or  hemoptysis or cp or chest tightness, subjective wheeze or overt sinus or hb symptoms.    Also denies any obvious fluctuation of symptoms with weather or environmental changes or other aggravating or alleviating factors except as outlined above   No unusual exposure hx or h/o childhood pna/ asthma or knowledge of premature birth.  Current Allergies, Complete Past Medical History, Past Surgical History, Family History, and Social History were reviewed in [employer] record.  ROS  The following are not active complaints unless bolded Hoarseness, sore throat, dysphagia, dental problems, itching, sneezing,  nasal congestion or discharge of excess mucus or purulent secretions, ear ache,   fever, chills, sweats, unintended wt loss or wt gain, classically pleuritic or exertional cp,  orthopnea pnd or arm/hand  swelling  or leg swelling, presyncope, palpitations, abdominal pain, anorexia, nausea, vomiting, diarrhea  or change in bowel habits or change in bladder habits, change in stools or change in urine, dysuria, hematuria,  rash, arthralgias, visual complaints, headache, numbness, weakness or ataxia or problems with walking or coordination,  change in mood or  memory.        Current Meds  Medication Sig   acetaminophen (TYLENOL) 650 MG CR tablet Take 1,300 mg by mouth 2 (two) times daily.   albuterol (PROAIR HFA) 108 (90 Base) MCG/ACT inhaler Inhale 2 puffs into the lungs every 4 (four) hours as needed for wheezing or shortness of breath.   amLODipine (NORVASC) 10 MG tablet Take 10 mg by mouth daily.   Artificial Tear Solution (SOOTHE XP OP) Place 1 drop into both eyes 2 (two) times daily.   budesonide-formoterol (SYMBICORT) 80-4.5 MCG/ACT inhaler Inhale 2 puffs into the lungs 2 (two) times daily. Take 2 puffs first thing in am and then another 2 puffs about 12 hours later.   buPROPion (WELLBUTRIN SR) 150 MG 12 hr tablet Take 150 mg by mouth 2 (two) times daily.   Calcium-Vitamin D (CALTRATE 600 PLUS-VIT D PO) Take 1 tablet by mouth daily.   chlorthalidone (HYGROTON) 25 MG tablet Take 25 mg by mouth every morning.   FLUoxetine (PROZAC) 20 MG capsule Take 20 mg by mouth daily.   ipratropium (ATROVENT) 0.03 % nasal spray USE 2 SPRAYS IN BOTH  NOSTRILS 3 TIMES DAILY   metoprolol succinate (TOPROL-XL) 25 MG 24 hr tablet Take 25 mg by mouth daily.   montelukast (SINGULAIR) 10 MG tablet TAKE 1 TABLET(10 MG) BY MOUTH AT BEDTIME   pantoprazole (PROTONIX) 40 MG tablet TAKE 1 TABLET BY MOUTH TWICE DAILY (Patient taking differently: 40 mg 2 (two) times daily.)   simvastatin (ZOCOR) 10 MG tablet Take 10 mg by mouth at bedtime.             Past Medical History:  Diagnosis Date   Anxiety    Arthritis    Breast cancer (HCC) 03/21/15   right   Chronic fatigue    Colitis, ischemic (HCC) 08/2007, 05/2011    Last colonoscopy/bx by Dr Rourk->(08/31/07) descending colon   Depression    Diverticulosis    Enlarged thyroid    Gait abnormality 10/18/2017   GERD (gastroesophageal reflux disease)    Gout    HTN (hypertension)    Hypercholesteremia    Lumbar spinal stenosis 11/07/2017   Severe at L4-5, moderate at L3-4   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   OA (osteoarthritis)    Obesity    PONV (postoperative nausea and vomiting)    Ptosis of eyelid, left    Sleep apnea  cpap   Spinal stenosis    Tubular adenoma of colon 08/2007        Objective:      wts  07/25/2022       222 05/24/2022       225   04/12/22 225 lb 6.4 oz (102.2 kg)  01/12/22 222 lb (100.7 kg)  05/11/21 217 lb 12.8 oz (98.8 kg)     Vital signs reviewed  07/25/2022  - Note at rest 02 sats  96% on RA   General appearance:    amb mod obese wf nad    HEENT : Oropharynx  clear     Nasal turbinates nl    NECK :  without  apparent JVD/ palpable Nodes/TM    LUNGS: no acc muscle use,  Nl contour chest which is clear to A and P bilaterally without cough on insp or exp maneuvers   CV:  RRR  no s3 or murmur or increase in P2, and no edema   ABD:  soft and nontender with nl inspiratory excursion in the supine position. No bruits or organomegaly appreciated   MS:  Nl gait/ ext warm without deformities Or obvious joint restrictions  calf tenderness, cyanosis or clubbing    SKIN: warm and dry without lesions    NEURO:  alert, approp, nl sensorium with  no motor or cerebellar deficits apparent.        Assessment

## 2022-07-26 ENCOUNTER — Encounter: Payer: Self-pay | Admitting: Internal Medicine

## 2022-07-26 ENCOUNTER — Other Ambulatory Visit: Payer: Self-pay | Admitting: Allergy & Immunology

## 2022-07-26 NOTE — Assessment & Plan Note (Signed)
Onset sporadic cough x 2000 - Allergy eval by Dr Ernst Bowler pos for cat with pos eos 200-400, offered biologics at last ov 03/24/21  - multiple pfts thru 04/11/21 wnl  - 04/12/2022  After extensive coaching inhaler device,  effectiveness =    80% hfa > try off symbicort and just use prn saba > did not follow instructions, continued to use symbicort 160 2bid but not consistently   >>>  07/25/2022 worse cough on symb 80 so increased to 160/ added  Hs  1st gen H1 blockers per guidelines  And depomerol 120 mg IM   Suspect strongly she has upper and lower airway component to the cough so will try again on high dose symbicort p rx with depomedrol today as well as 1st gen H1 if helps and if not sustained better > refer back to Allergy  Discussed in detail all the  indications, usual  risks and alternatives  relative to the benefits with patient who agrees to proceed with Rx as outlined.             Each maintenance medication was reviewed in detail including emphasizing most importantly the difference between maintenance and prns and under what circumstances the prns are to be triggered using an action plan format where appropriate.  Total time for H and P, chart review, counseling, reviewing hfa device(s) and generating customized AVS unique to this summary office visit / same day charting > 30 min for   refractory respiratory  symptoms of uncertain etiology

## 2022-08-04 ENCOUNTER — Encounter: Payer: Self-pay | Admitting: Internal Medicine

## 2022-08-04 MED ORDER — GABAPENTIN 100 MG PO CAPS: 100.0000 mg | ORAL_CAPSULE | Freq: Three times a day (TID) | ORAL | 0 refills | Status: DC

## 2022-08-04 NOTE — Telephone Encounter (Signed)
"  I just wanted to ask how long the new cough meds prescribed start working, my cough is no better taking them and wanted to see if there is anything else that might help or do I need to give it a little longer?"   Patient states her cough has not changed at all since last visit and wants to know if theres something else she can try for it.   Please advise

## 2022-08-04 NOTE — Telephone Encounter (Signed)
My notes indicate she's been coughing for decades so it's unlikely we can totally eliminate the cough this quickly but I'm sorry to hear she's not seeing results yet from the recs made at the last ov   Would plan on f/u with Dr Ernst Bowler but in meantime can try gabapentin 100 mg tid and f/u with me with all meds in hand by the end of next week unless can see Ernst Bowler sooner

## 2022-09-06 ENCOUNTER — Ambulatory Visit: Payer: Medicare Other | Admitting: Internal Medicine

## 2022-09-06 ENCOUNTER — Encounter: Payer: Self-pay | Admitting: Internal Medicine

## 2022-09-06 DIAGNOSIS — R0609 Other forms of dyspnea: Secondary | ICD-10-CM | POA: Diagnosis not present

## 2022-09-06 DIAGNOSIS — R053 Chronic cough: Secondary | ICD-10-CM

## 2022-09-06 NOTE — Patient Instructions (Addendum)
Increase gabapentin 100 mg one extra per week until reach maximum of 300 three times a day or cough is gone or side effects   Increase chlorpheniramine to 1-2 every 4 hours as needed for throat clearing   Try off the symbicort completely, restart if breathing or coughing worse   If not improving you need see Dr Ernst Bowler next   Please schedule a follow up visit in 3 months but call sooner if needed

## 2022-09-06 NOTE — Progress Notes (Unsigned)
Crystal [name], female    DOB: October 26, 1941,   MRN: [id number]   Brief patient profile:  27   yowf  never smoker referred to pulmonary clinic in [place]  04/12/2022 by Dr Isaiah Serge for daytime sporadic dry cough x 25 years p starting cpap  but worse more severe  x spirng 2022.  Allergy eval by Dr Dellis Anes pos for cat with pos eos 200-400, offered biologics at lat ov 03/24/21    History of Present Illness  04/12/2022  Pulmonary/ 1st office eval/ Carman Auxier / [place] [facility] maint on symb 160 with acceptable technique s apparent response  Chief Complaint  Patient presents with   New Patient (Initial Visit)    Prev. Patient of Dr. Isaiah Serge.   Dyspnea:  rollator limited by energy > doe  Cough: dry hack typically doesn't wake up/ sleeps in recliner due back x years Sometimes cc dysphagia not consistent and does not correlate with cough  Sleep: no longer using cpap and feels like sleeps fine SABA use: not helping  Prednisone seems to help  Seems to correlate with nasal congestion  Onset this cough was one week prior to OV  Rec Protonix (pantoprazole) 40 mg Take 30- 60 min before your first and last meals of the day   GERD diet reviewed, bed blocks rec  Stop all your inhalers except albuterol which you can use as follows Only use your albuterol as a rescue medication  Ok to try albuterol 15 min before an activity (on alternating days)  that you know would usually make you short of breath   For cough > tessalon pearls 200 mg every 6 hours prn     05/24/2022  f/u ov/[place] office/Gavinn Collard re: gerd rx  maint on gerd rx and ? Symb 160 (confused with instructions)   Chief Complaint  Patient presents with   Follow-up    Coughing has improved- has been coming and going.  Would like symbicort and tessalon perles [facility] with [facility] days supply.   Dyspnea:  rollator one aisle and legs give out /poor balance not  doe limiting  Cough: better / tessalon helps some  Sleeping: 45  degrees x 3 y  and  off cpap   sleeps better s daytime hypersomnolence SABA use: didn't  help but not clear she understood instructions at all  02: none  Covid status: vax x 3  Rec Once your symbicort 160 is used up please change to  symbicort to 80 Take 2 puffs first thing in am and then another 2 puffs about 12 hours later.  Work on maintaining perfect  inhaler technique:   For breathing >>> Only use your albuterol as a rescue medication For cough >>>   use tessalon 200 mg every 6 -8 hours  only if  needed       07/25/2022  f/u ov/[place] office/Graceann Boileau re: chronic cough x decades maint on symb 80 2bid / singulair /gerd rx   Chief Complaint  Patient presents with   Follow-up    Cough  is coming back needs clarification on symbicort  Dyspnea: gets let off at door due to fatigue > sob  Cough: worse on the 80 strength of symbicort  with increased need for tessalon  Sleeping: having to sit up recliner higher also since lowered dose of symb SABA use: not using  02: none  Rec Change back symbicort to 160 Take 2 puffs first thing in am and then another 2 puffs about 12 hours  later.  For drainage / throat tickle try take CHLORPHENIRAMINE  4 mg   Depomedrol 120 mg IM should work for about a week and if a lot better then worse again, please see Dr Dellis Anes no better p shot    09/06/2022  f/u ov/Kaylib Furness re: cough  maint on symbicort 160  / gabapentin 100 mg three times  Chief Complaint  Patient presents with   Follow-up    She is doing a little better with her cough since last OV.   Dyspnea:  fatigue > sob  Cough: sporadic can go for weeks s coughing / does not cough at night p h1 taking chlorpheniramine  Sleeping: 45 degrees SABA use: just symbicort  02: not      No obvious day to day or daytime variability or assoc excess/ purulent sputum or mucus plugs or hemoptysis or cp or chest tightness, subjective wheeze or overt sinus or hb symptoms.   *** without nocturnal  or early am exacerbation  of  respiratory  c/o's or need for noct saba. Also denies any obvious fluctuation of symptoms with weather or environmental changes or other aggravating or alleviating factors except as outlined above   No unusual exposure hx or h/o childhood pna/ asthma or knowledge of premature birth.  Current Allergies, Complete Past Medical History, Past Surgical History, Family History, and Social History were reviewed in [employer] record.  ROS  The following are not active complaints unless bolded Hoarseness, sore throat, dysphagia, dental problems, itching, sneezing,  nasal congestion or discharge of excess mucus or purulent secretions, ear ache,   fever, chills, sweats, unintended wt loss or wt gain, classically pleuritic or exertional cp,  orthopnea pnd or arm/hand swelling  or leg swelling, presyncope, palpitations, abdominal pain, anorexia, nausea, vomiting, diarrhea  or change in bowel habits or change in bladder habits, change in stools or change in urine, dysuria, hematuria,  rash, arthralgias, visual complaints, headache, numbness, weakness or ataxia or problems with walking or coordination,  change in mood or  memory.        Current Meds  Medication Sig   acetaminophen (TYLENOL) 650 MG CR tablet Take 1,300 mg by mouth 2 (two) times daily.   albuterol (PROAIR HFA) 108 (90 Base) MCG/ACT inhaler Inhale 2 puffs into the lungs every 4 (four) hours as needed for wheezing or shortness of breath.   amLODipine (NORVASC) 10 MG tablet Take 10 mg by mouth daily.   Artificial Tear Solution (SOOTHE XP OP) Place 1 drop into both eyes 2 (two) times daily.   budesonide-formoterol (SYMBICORT) 160-4.5 MCG/ACT inhaler Take 2 puffs first thing in am and then another 2 puffs about 12 hours later.   buPROPion (WELLBUTRIN SR) 150 MG 12 hr tablet Take 150 mg by mouth 2 (two) times daily.   Calcium-Vitamin D (CALTRATE 600 PLUS-VIT D PO) Take 1 tablet by mouth daily.   chlorthalidone (HYGROTON) 25 MG  tablet Take 25 mg by mouth every morning.   FLUoxetine (PROZAC) 20 MG capsule Take 20 mg by mouth daily.   ipratropium (ATROVENT) 0.03 % nasal spray USE 2 SPRAYS IN BOTH  NOSTRILS 3 TIMES DAILY   loratadine (ALLERGY) 10 MG tablet Take 10 mg by mouth daily.   metoprolol succinate (TOPROL-XL) 25 MG 24 hr tablet Take 25 mg by mouth daily.   montelukast (SINGULAIR) 10 MG tablet TAKE 1 TABLET(10 MG) BY MOUTH AT BEDTIME   pantoprazole (PROTONIX) 40 MG tablet TAKE 1 TABLET BY MOUTH TWICE DAILY (  Patient taking differently: 40 mg 2 (two) times daily.)   simvastatin (ZOCOR) 10 MG tablet Take 10 mg by mouth at bedtime.           Past Medical History:  Diagnosis Date   Anxiety    Arthritis    Breast cancer (HCC) 03/21/15   right   Chronic fatigue    Colitis, ischemic (HCC) 08/2007, 05/2011   Last colonoscopy/bx by Dr Rourk->(08/31/07) descending colon   Depression    Diverticulosis    Enlarged thyroid    Gait abnormality 10/18/2017   GERD (gastroesophageal reflux disease)    Gout    HTN (hypertension)    Hypercholesteremia    Lumbar spinal stenosis 11/07/2017   Severe at L4-5, moderate at L3-4   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   OA (osteoarthritis)    Obesity    PONV (postoperative nausea and vomiting)    Ptosis of eyelid, left    Sleep apnea    cpap   Spinal stenosis    Tubular adenoma of colon 08/2007        Objective:      wts  09/06/2022       ***  07/25/2022       222 05/24/2022       225   04/12/22 225 lb 6.4 oz (102.2 kg)  01/12/22 222 lb (100.7 kg)  05/11/21 217 lb 12.8 oz (98.8 kg)     Vital signs reviewed  09/06/2022  - Note at rest 02 sats  ***% on ***   General appearance:    obese amb wf nad / rollator       Assessment

## 2022-09-07 ENCOUNTER — Encounter: Payer: Self-pay | Admitting: Internal Medicine

## 2022-09-07 NOTE — Assessment & Plan Note (Signed)
04/12/2022   Walked on RA  x  one  lap(s) =  approx 150  ft  @ slow pace, stopped due to being tired  with lowest 02 sats 96%  - 09/06/2022 try off symbicort 160 > ok to use prn   Based on two studies from NEJM  378; 20 p 1865 (2018) and 380 : p2020-30 (2019) in pts with mild asthma it is reasonable to use  symbicort  "prn" flare in this setting but I emphasized this was only shown with symbicort and takes advantage of the rapid onset of action but is not the same as "rescue therapy" but can be stopped once the acute symptoms have resolved and the need for rescue has been minimized (< 2 x weekly)           Each maintenance medication was reviewed in detail including emphasizing most importantly the difference between maintenance and prns and under what circumstances the prns are to be triggered using an action plan format where appropriate.  Total time for H and P, chart review, counseling, reviewing hfa device(s) and generating customized AVS unique to this office visit / same day charting = 30 min

## 2022-09-07 NOTE — Assessment & Plan Note (Addendum)
Onset sporadic cough x 2000 - Allergy eval by Dr Ernst Bowler pos for cat with pos eos 200-400, offered biologics at last ov 03/24/21  - multiple pfts thru 04/11/21 wnl  - 04/12/2022  After extensive coaching inhaler device,  effectiveness =    80% hfa > try off symbicort and just use prn saba > did not follow instructions, continued to use symbicort 160 2bid but not consistently  - 07/25/2022 worse cough on symb 80 so increased to 160/ added  Hs  1st gen H1 blockers per guidelines  And depomerol 120 mg IM > did not help cough or breating - 08/04/22  rx gabapentin 100 mg tid    >>>    09/06/2022 titrate up gabapentin 100 to max of 300 tid if tol   Cough appears entirely to be upper airway at this point, not sure symbicort really helping > try off and use h1 daytime if tol for throat tickle>> restart if worse and consider MCT next if still unclear as to cause and also ask her to regroup with Dr Ernst Bowler  Discussed in detail all the  indications, usual  risks and alternatives  relative to the benefits with patient who agrees to proceed with Rx as outlined.

## 2022-09-14 ENCOUNTER — Encounter: Payer: Self-pay | Admitting: Internal Medicine

## 2022-09-14 NOTE — Telephone Encounter (Signed)
She should drop dose by total of 100 mg (one dose) per day starting with the am and lunch doses first until the drowsiness is tolerable then stay on that dose until next ov - if groggy comes back again just keep reducing the dose by one pill a day.  If cough comes back go up by one pill a day to fine "just the righ dose"

## 2022-09-14 NOTE — Telephone Encounter (Signed)
Dr. Melvyn Novas please advise, patients cough is gone and and she has taken up to 300 mg in a day. She states it has made her groggy and is wanting clarification about whether she should continue medication or not since her cough has stopped for now. Please advise   From last AVS: "Increase gabapentin 100 mg one extra per week until reach maximum of 300 mg three times a day or cough is gone or side effects"

## 2022-10-12 ENCOUNTER — Ambulatory Visit: Payer: Medicare Other | Admitting: Family

## 2022-10-19 ENCOUNTER — Ambulatory Visit: Payer: Medicare Other | Admitting: Family

## 2022-10-19 ENCOUNTER — Encounter: Payer: Self-pay | Admitting: Family

## 2022-10-19 ENCOUNTER — Other Ambulatory Visit: Payer: Self-pay

## 2022-10-19 VITALS — BP 142/78 | HR 80 | Temp 98.1°F | Resp 16 | Ht 63.0 in | Wt 222.0 lb

## 2022-10-19 DIAGNOSIS — J3089 Other allergic rhinitis: Secondary | ICD-10-CM

## 2022-10-19 DIAGNOSIS — R053 Chronic cough: Secondary | ICD-10-CM | POA: Diagnosis not present

## 2022-10-19 NOTE — Patient Instructions (Signed)
1. Chronic cough - Continue to follow up with pulmonary - If ipratropium bromide nasal spray does not help with cough, recommend calling Dr. Melvyn Novas about restarting gabapentin - Continue with albuterol as needed  2. Chronic rhinitis (cat) - When you have trickle sensation coming down your throat start: Atrovent (ipratropium bromide nasal spray) one spray per nostril up to two times a day, but this can be OVER DRYING, so be careful!   Please let us know if this treatment plan is not working well for you Schedule a follow-up appointment in 2  months or sooner if needed

## 2022-10-19 NOTE — Progress Notes (Signed)
[address], [address] [place] [address] Dept: [phone]  FOLLOW UP NOTE  Patient ID: Crystal [name], female    DOB: 1941/02/03  Age: 81 y.o. MRN: [id number] Date of Office Visit: 10/19/2022  Assessment  Chief Complaint: Cough (Constant cough 4 -5 weeks and this week its on a pause but it usually starts back up after a week or two )  HPI Crystal [name] is an 81 year old female who presents today for follow-up of chronic cough and perennial allergic rhinitis.  She was last seen on April 21, 2021 by myself.  Crystal [name] is here with Crystal today and helps provide history.  She denies any new diagnosis or surgery since Crystal last office visit.  Chronic cough: This cough started approximately 35 to 40 years ago.  Dr. Melvyn Novas has taken Crystal off Symbicort and Crystal nose sprays to see how Crystal cough is.  She is also no longer taking gabapentin that was prescribed by Dr. Melvyn Novas.  She mentions that she was on it for a week and got dizzy 1 time and stopped.  She was not certain if it helped for the cough.  She reports that when she does cough she will have a hacky cough for 3 to 4 weeks and then it stops for a couple weeks and cough will come back.  She has not had a cough for the past 2 days.  She reports that she will feel a trickle down Crystal throat that causes the cough.  She has tried ipratropium nasal spray in the past.  Crystal [name] thinks that this is what helped the most with Crystal cough.  She is interested in restarting ipratropium bromide nasal spray.  She denies wheezing, tightness in Crystal chest, and nocturnal awakenings due to breathing problems.  She does have shortness of breath with exertion that goes away within 2 to 3 breaths.  She does have albuterol to use as needed and has not been using it.  When asked if albuterol has helped Crystal cough in the past she reports that nothing she has tried has helped.  She has seen ear nose and throat in the past and was told that Crystal throat looked a little red  and she is currently taking pantoprazole twice a day and only reports feeling nauseated when she eats too much once in a while.  Otherwise she denies heartburn or reflux.  She has been on pantoprazole twice a day for several years.  She cannot remember if she has seen GI in the past.  Perennial allergic rhinitis: Crystal skin testing in the past was allergic to only cat.  She does not have a cat.  She reports rhinorrhea only when she is coughing.  She also feels postnasal drip that feels like it trickles down Crystal throat and causes Crystal to cough.  She denies nasal congestion.  She has not had any sinus infections since we last saw Crystal.   Drug Allergies:  Allergies  Allergen Reactions   Bactrim [Sulfamethoxazole-Trimethoprim] Nausea Only    Review of Systems: Review of Systems  Constitutional:  Negative for chills and fever.  HENT:         Reports clear rhinorrhea only when she coughs.  She also reports that she will at times feel trickle down Crystal throat that does not cause coughing.  Denies nasal congestion  Eyes:        Reports watery eyes for which she has drops.  Crystal eyes will run also when  she coughs  Respiratory:  Positive for cough and shortness of breath. Negative for wheezing.        Reports dry cough that occurs for 3 to 4 weeks and stops for a couple weeks.  She also has shortness of breath with exertion and the shortness of breath will go away within 2-3 breaths.  She denies wheezing, tightness in Crystal chest, and nocturnal awakening due to breathing problems  Cardiovascular:  Negative for chest pain and palpitations.  Gastrointestinal:        Reports feeling nauseated once in a while only when she eats too much.  She does take pantoprazole twice a day  Genitourinary:  Positive for frequency.       Reports increased frequency of urination.  She will go to the bathroom every 2 hours but she does drink tea a lot.  She will drink a gallon a week.  Skin:  Negative for itching and rash.   Neurological:  Negative for headaches.  Endo/Heme/Allergies:  Positive for environmental allergies.     Physical Exam: BP (!) 142/78   Pulse 80   Temp 98.1 F (36.7 C)   Resp 16   Ht '5\' 3"'$  (1.6 m)   Wt 222 lb (100.7 kg)   SpO2 96%   BMI 39.33 kg/m    Physical Exam Exam conducted with a chaperone present.  Constitutional:      Appearance: Normal appearance.  HENT:     Head: Normocephalic and atraumatic.     Comments: Pharynx normal, eyes normal, ears normal, nose normal    Right Ear: Tympanic membrane, ear canal and external ear normal.     Left Ear: Tympanic membrane, ear canal and external ear normal.     Nose: Nose normal.     Mouth/Throat:     Mouth: Mucous membranes are moist.     Pharynx: Oropharynx is clear.  Eyes:     Conjunctiva/sclera: Conjunctivae normal.  Cardiovascular:     Rate and Rhythm: Normal rate and regular rhythm.     Heart sounds: Normal heart sounds.  Pulmonary:     Effort: Pulmonary effort is normal.     Breath sounds: Normal breath sounds.     Comments: Lungs clear to auscultation Musculoskeletal:     Cervical back: Neck supple.  Skin:    General: Skin is warm.  Neurological:     Mental Status: She is alert and oriented to person, place, and time.  Psychiatric:        Mood and Affect: Mood normal.        Behavior: Behavior normal.        Thought Content: Thought content normal.        Judgment: Judgment normal.     Diagnostics: None  She did have a chest x-ray on June 22, 2021 showing: ".IMPRESSION: No active cardiopulmonary disease. Cardiomegaly with low lung volume."     Assessment and Plan: 1. Chronic cough   2. Perennial allergic rhinitis     No orders of the defined types were placed in this encounter.   Patient Instructions  1. Chronic cough - Continue to follow up with pulmonary - If ipratropium bromide nasal spray does not help with cough, recommend calling Dr. Melvyn Novas about restarting gabapentin - Continue with  albuterol as needed  2. Chronic rhinitis (cat) - When you have trickle sensation coming down your throat start: Atrovent (ipratropium bromide nasal spray) one spray per nostril up to two times a day, but this can be  OVER DRYING, so be careful!   Please let us know if this treatment plan is not working well for you Schedule a follow-up appointment in 2  months or sooner if needed   Return in about 2 months (around 12/19/2022), or if symptoms worsen or fail to improve.    Thank you for the opportunity to care for this patient.  Please do not hesitate to contact me with questions.  Althea Charon, FNP [facility]

## 2022-11-10 ENCOUNTER — Other Ambulatory Visit: Payer: Self-pay | Admitting: Primary Care

## 2022-11-14 ENCOUNTER — Encounter: Payer: Self-pay | Admitting: Internal Medicine

## 2022-11-14 MED ORDER — GABAPENTIN 100 MG PO CAPS: 100.0000 mg | ORAL_CAPSULE | Freq: Three times a day (TID) | ORAL | 0 refills | Status: DC

## 2022-11-14 NOTE — Telephone Encounter (Signed)
Good morning.  My Mother saw Crystal [name] at the [facility] in [place] on Oct 25.  She advised her to start back taking the Ipratropium which she did.  Her cough started back around Nov 6 and has gotten pretty bad. Dr. Quita Skye advised her to contact your office to see if you would want her to start back on the galbapentin, if so she will need a refill.   Thank you

## 2022-11-14 NOTE — Telephone Encounter (Signed)
Ok to start back on gabapentin at whatever dose she prev tol but ov with me with all meds in 6 weeks to regroup - sooner if needed

## 2022-12-12 ENCOUNTER — Encounter: Payer: Self-pay | Admitting: Internal Medicine

## 2022-12-12 ENCOUNTER — Ambulatory Visit: Payer: Medicare Other | Admitting: Internal Medicine

## 2022-12-12 VITALS — BP 136/84 | HR 90 | Temp 97.9°F | Ht 63.0 in | Wt 228.4 lb

## 2022-12-12 DIAGNOSIS — R053 Chronic cough: Secondary | ICD-10-CM

## 2022-12-12 NOTE — Patient Instructions (Addendum)
Keep using the [employer] as needed and let Dr Gillermina Hu office know about any new nasal symptoms you have.  For drainage / throat tickle try (instead of the other antihistamines)  take CHLORPHENIRAMINE  4 mg  ("Allergy Relief" '4mg'$   at [facility] should be easiest to find in the blue box usually on bottom shelf)  take one every 4 hours as needed - extremely effective and inexpensive over the counter- may cause drowsiness so start with just a dose or two an hour before bedtime and see how you tolerate it before trying in daytime.   If need to return > with all meds in hand using a trust but verify approach to confirm accurate Medication  Reconciliation The principal here is that until we are certain that the  patients are doing what we've asked, it makes no sense to ask them to do more.

## 2022-12-12 NOTE — Progress Notes (Unsigned)
Crystal [name], female    DOB: 1940-12-31,   MRN: [id number]   Brief patient profile:  14   yowf  never smoker referred to pulmonary clinic in [place]  04/12/2022 by Dr Isaiah Serge for daytime sporadic dry cough x 1990s p starting cpap  but worse=  more severe  x spirng 2022.  Allergy eval by Dr Dellis Anes pos for cat with pos eos 200-400, offered biologics at last ov 03/24/21    History of Present Illness  04/12/2022  Pulmonary/ 1st office eval/ Zyire Eidson / [place] [facility] maint on symb 160 with acceptable technique s apparent response  Chief Complaint  Patient presents with   New Patient (Initial Visit)    Prev. Patient of Dr. Isaiah Serge.   Dyspnea:  rollator limited by energy > doe  Cough: dry hack typically doesn't wake up/ sleeps in recliner due back x years Sometimes cc dysphagia not consistent and does not correlate with cough  Sleep: no longer using cpap and feels like sleeps fine SABA use: not helping  Prednisone seems to help  Seems to correlate with nasal congestion  Onset this cough was one week prior to OV  Rec Protonix (pantoprazole) 40 mg Take 30- 60 min before your first and last meals of the day   GERD diet reviewed, bed blocks rec  Stop all your inhalers except albuterol which you can use as follows Only use your albuterol as a rescue medication  Ok to try albuterol 15 min before an activity (on alternating days)  that you know would usually make you short of breath   For cough > tessalon pearls 200 mg every 6 hours prn     05/24/2022  f/u ov/[place] office/Teffany Blaszczyk re: gerd rx  maint on gerd rx and ? Symb 160 (confused with instructions)   Chief Complaint  Patient presents with   Follow-up    Coughing has improved- has been coming and going.  Would like symbicort and tessalon perles [facility] with [facility] days supply.   Dyspnea:  rollator one aisle and legs give out /poor balance not  doe limiting  Cough: better / tessalon helps some  Sleeping: 45  degrees x 3 y  and  off cpap   sleeps better s daytime hypersomnolence SABA use: didn't  help but not clear she understood instructions at all  02: none  Covid status: vax x 3  Rec Once your symbicort 160 is used up please change to  symbicort to 80 Take 2 puffs first thing in am and then another 2 puffs about 12 hours later.  Work on maintaining perfect  inhaler technique:   For breathing >>> Only use your albuterol as a rescue medication For cough >>>   use tessalon 200 mg every 6 -8 hours  only if  needed       07/25/2022  f/u ov/[place] office/Tyller Bowlby re: chronic cough x decades maint on symb 80 2bid / singulair /gerd rx   Chief Complaint  Patient presents with   Follow-up    Cough  is coming back needs clarification on symbicort  Dyspnea: gets let off at door due to fatigue > sob  Cough: worse on the 80 strength of symbicort  with increased need for tessalon  Sleeping: having to sit up recliner higher also since lowered dose of symb SABA use: not using  02: none  Rec Change back symbicort to 160 Take 2 puffs first thing in am and then another 2 puffs about 12 hours  later.  For drainage / throat tickle try take CHLORPHENIRAMINE  4 mg   Depomedrol 120 mg IM should work for about a week and if a lot better then worse again, please see Dr Dellis Anes no better p shot    09/06/2022  f/u ov/Analiya Porco re: cough  maint on symbicort 160  / gabapentin 100 mg tid Chief Complaint  Patient presents with   Follow-up    She is doing a little better with her cough since last OV.   Dyspnea:  fatigue > sob  Cough: dry sporadic can go for weeks s coughing / does not cough at night p h1  chlorpheniramine  Sleeping: 45 degrees SABA use: just symbicort  02: not using  Rec Increase gabapentin 100 mg one extra per week until reach maximum of 300 three times a day or cough is gone or side effects  Increase chlorpheniramine to 1-2 every 4 hours as needed for throat clearing  Try off the symbicort completely, restart if  breathing or coughing worse  If not improving you need see Dr Dellis Anes next     12/12/2022  f/u ov/[place] office/Valen Gillison re: sporadic cough rx ipatropium and singulair and gerd  rx and h1 hs  Chief Complaint  Patient presents with   Follow-up    Cough improved    Dyspnea:  walks with rollator issues of balance  Cough: gone, so is the nasal congestion  Sleeping: recliner / 45 degrees  SABA use:   none  02: none        No obvious day to day or daytime variability or assoc excess/ purulent sputum or mucus plugs or hemoptysis or cp or chest tightness, subjective wheeze or overt sinus or hb symptoms.   *** without nocturnal  or early am exacerbation  of respiratory  c/o's or need for noct saba. Also denies any obvious fluctuation of symptoms with weather or environmental changes or other aggravating or alleviating factors except as outlined above   No unusual exposure hx or h/o childhood pna/ asthma or knowledge of premature birth.  Current Allergies, Complete Past Medical History, Past Surgical History, Family History, and Social History were reviewed in [employer] record.  ROS  The following are not active complaints unless bolded Hoarseness, sore throat, dysphagia, dental problems, itching, sneezing,  nasal congestion or discharge of excess mucus or purulent secretions, ear ache,   fever, chills, sweats, unintended wt loss or wt gain, classically pleuritic or exertional cp,  orthopnea pnd or arm/hand swelling  or leg swelling, presyncope, palpitations, abdominal pain, anorexia, nausea, vomiting, diarrhea  or change in bowel habits or change in bladder habits, change in stools or change in urine, dysuria, hematuria,  rash, arthralgias, visual complaints, headache, numbness, weakness or ataxia or problems with walking or coordination,  change in mood or  memory.        Current Meds  Medication Sig   acetaminophen (TYLENOL) 650 MG CR tablet Take 1,300 mg by  mouth 2 (two) times daily.   albuterol (PROAIR HFA) 108 (90 Base) MCG/ACT inhaler Inhale 2 puffs into the lungs every 4 (four) hours as needed for wheezing or shortness of breath.   amLODipine (NORVASC) 10 MG tablet Take 10 mg by mouth daily.   Artificial Tear Solution (SOOTHE XP OP) Place 1 drop into both eyes 2 (two) times daily.   budesonide-formoterol (SYMBICORT) 160-4.5 MCG/ACT inhaler Take 2 puffs first thing in am and then another 2 puffs about 12 hours later.  buPROPion (WELLBUTRIN SR) 150 MG 12 hr tablet Take 150 mg by mouth 2 (two) times daily.   Calcium-Vitamin D (CALTRATE 600 PLUS-VIT D PO) Take 1 tablet by mouth daily.   chlorthalidone (HYGROTON) 25 MG tablet Take 25 mg by mouth every morning.   FLUoxetine (PROZAC) 20 MG capsule Take 20 mg by mouth daily.   gabapentin (NEURONTIN) 100 MG capsule Take 1 capsule (100 mg total) by mouth 3 (three) times daily.   ipratropium (ATROVENT) 0.03 % nasal spray USE 2 SPRAYS IN BOTH  NOSTRILS 3 TIMES DAILY   loratadine (ALLERGY) 10 MG tablet Take 10 mg by mouth daily.   metoprolol succinate (TOPROL-XL) 25 MG 24 hr tablet Take 25 mg by mouth daily.   montelukast (SINGULAIR) 10 MG tablet TAKE 1 TABLET BY MOUTH AT  BEDTIME   pantoprazole (PROTONIX) 40 MG tablet TAKE 1 TABLET BY MOUTH TWICE DAILY (Patient taking differently: 40 mg 2 (two) times daily.)   simvastatin (ZOCOR) 10 MG tablet Take 10 mg by mouth at bedtime.            Past Medical History:  Diagnosis Date   Anxiety    Arthritis    Breast cancer (HCC) 03/21/15   right   Chronic fatigue    Colitis, ischemic (HCC) 08/2007, 05/2011   Last colonoscopy/bx by Dr Rourk->(08/31/07) descending colon   Depression    Diverticulosis    Enlarged thyroid    Gait abnormality 10/18/2017   GERD (gastroesophageal reflux disease)    Gout    HTN (hypertension)    Hypercholesteremia    Lumbar spinal stenosis 11/07/2017   Severe at L4-5, moderate at L3-4   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   OA  (osteoarthritis)    Obesity    PONV (postoperative nausea and vomiting)    Ptosis of eyelid, left    Sleep apnea    cpap   Spinal stenosis    Tubular adenoma of colon 08/2007        Objective:    wts  12/12/2022      ***  09/06/2022       222 07/25/2022       222 05/24/2022       225   04/12/22 225 lb 6.4 oz (102.2 kg)  01/12/22 222 lb (100.7 kg)  05/11/21 217 lb 12.8 oz (98.8 kg)    Vital signs reviewed  12/12/2022  - Note at rest 02 sats  ***% on ***   General appearance:    ***        Assessment

## 2022-12-13 ENCOUNTER — Ambulatory Visit: Payer: Medicare Other | Admitting: Internal Medicine

## 2022-12-13 NOTE — Assessment & Plan Note (Signed)
Onset sporadic cough x 2000 - Allergy eval by Dr Ernst Bowler pos for cat with pos eos 200-400, offered biologics at last ov 03/24/21  - multiple pfts thru 04/11/21 wnl  - 04/12/2022  After extensive coaching inhaler device,  effectiveness =    80% hfa > try off symbicort and just use prn saba > did not follow instructions, continued to use symbicort 160 2bid but not consistently  - 07/25/2022 worse cough on symb 80 so increased to 160/ added  Hs  1st gen H1 blockers per guidelines plus depomerol 120 mg IM > did not help cough or breathing - 08/04/22  rx gabapentin 100 mg tid  - 09/06/2022 titrate up gabapentin 100 to max of 300 tid > did not tolerate  Pattern of cough has been sporadic and for now seems better with rx for rhinitis and not asthma - pt getting confused with two sources of advice for symptoms and challenging med reconciliation so rec :  1st gen H1 blockers per guidelines  :  favor chlorpheniramine if tolerates as most cost effective for pnds for cough   F/u here prn with all meds in hand using a trust but verify approach to confirm accurate Medication  Reconciliation The principal here is that until we are certain that the  patients are doing what we've asked, it makes no sense to ask them to do more.          Each maintenance medication was reviewed in detail including emphasizing most importantly the difference between maintenance and prns and under what circumstances the prns are to be triggered using an action plan format where appropriate.  Total time for H and P, chart review, counseling, reviewing nasal/hfa device(s) and generating customized AVS unique to this summary final f/u office visit / same day charting > 30 min for   refractory respiratory  symptoms of uncertain etiology

## 2022-12-27 NOTE — Progress Notes (Signed)
[address] [place] [address] Dept: [phone]  FOLLOW UP NOTE  Patient ID: Crystal [name], female    DOB: 1941/12/16  Age: 82 y.o. MRN: [id number] Date of Office Visit: 12/28/2022  Assessment  Chief Complaint: Follow-up (Follow up cough comes and goes.)  HPI Crystal [name] is an 82 year old female who presents to the clinic for follow-up visit.  She was last seen in this clinic on 10/19/2022 by Althea Charon, FNP, for evaluation of chronic rhinitis positive to cat and chronic cough.  She last saw Dr. Melvyn Novas on 12/12/2022 and was prescribed chlorphenamine 4 mg once every 4 hours as needed.  She is accompanied by her daughter who assists with history.  At today's visit, she reports her cough has been intermittent occurring from November 8 to November 28 and then resolving until December 15 when the cough restarted.  She reports this cough is dry and occurs mainly as a tickle in her throat.  She denies fever, sweats, chills, and sick contacts.  She has tried Symbicort and albuterol with no change in her cough and has not used these medications for the last several months.  She has recently restarted gabapentin with immediate swelling in her lower extremities after which she discontinued gabapentin.  Allergic rhinitis is reported as not well-controlled with symptoms including clear rhinorrhea, occasional sneeze, and postnasal drainage with frequent throat clearing and cough.  She continues ipratropium about twice a day with moderate relief of rhinorrhea.  She is not currently using Flonase or nasal saline rinses.  She is not currently taking an antihistamine daily.  Her last environmental allergy testing was on 03/24/2021 and was positive to cats.  She reports that she does not currently have any exposure to cats.  Reflux is reported as moderately well-controlled with heartburn occurring about 3 days a week.  She continues pantoprazole 40 mg twice a day 30 minutes before her meals  with moderate relief of symptoms.  She is not currently taking famotidine.  Her current medications are listed in the chart.   Drug Allergies:  Allergies  Allergen Reactions   Bactrim [Sulfamethoxazole-Trimethoprim] Nausea Only    Physical Exam: BP 128/72   Pulse 97   Temp 98 F (36.7 C)   Resp 20   Ht '5\' 3"'$  (1.6 m)   Wt 228 lb (103.4 kg)   SpO2 98%   BMI 40.39 kg/m    Physical Exam Vitals reviewed.  Constitutional:      Appearance: Normal appearance.  HENT:     Head: Normocephalic and atraumatic.     Right Ear: Tympanic membrane normal.     Left Ear: Tympanic membrane normal.     Nose:     Comments: Bilateral nares slightly erythematous with clear thin nasal drainage noted. Pharynx slightly erythematous with no exudate.  Ears normal. Eyes normal. Eyes:     Conjunctiva/sclera: Conjunctivae normal.  Cardiovascular:     Rate and Rhythm: Normal rate and regular rhythm.     Heart sounds: Normal heart sounds. No murmur heard. Pulmonary:     Effort: Pulmonary effort is normal.     Breath sounds: Normal breath sounds.     Comments: Lungs clear to auscultation Musculoskeletal:        General: Normal range of motion.     Cervical back: Normal range of motion and neck supple.  Skin:    General: Skin is warm and dry.  Neurological:     Mental Status: She is alert and  oriented to person, place, and time.  Psychiatric:        Mood and Affect: Mood normal.        Behavior: Behavior normal.        Thought Content: Thought content normal.        Judgment: Judgment normal.     Assessment and Plan: 1. Chronic cough   2. Perennial allergic rhinitis   3. Gastroesophageal reflux disease, unspecified whether esophagitis present     Meds ordered this encounter  Medications   famotidine (PEPCID) 20 MG tablet    Sig: Take 1 tablet (20 mg total) by mouth daily.    Dispense:  30 tablet    Refill:  1   fluticasone (FLONASE) 50 MCG/ACT nasal spray    Sig: Use 1 to 2 sprays in  each nostril once a day as needed for stuffy nose.  In the right nostril, point the applicator out toward the right ear. In the left nostril, point the applicator out toward the left ear    Dispense:  16 g    Refill:  1    Patient Instructions  Cough Continue albuterol 2 puffs once every 4 hours as needed for cough or wheeze You may use albuterol 2 puffs 5-15 minutes before activity to decrease cough or wheeze Keep up the coughing journal. If your symptoms re-occur, begin a journal of events that occurred for up to 6 hours before your symptoms began including foods and beverages consumed, soaps or perfumes you had contact with, and medications.   Allergic rhinitis/non-allergic rhinitis Continue ipratropium nasal spray up to three times a day as needed for a runny nose Use Flonase nasal spray 1-2 sprays in each nostril once a day as needed for a stuffy nose or drainage Consider saline nasal rinses as needed for nasal symptoms. Use this before any medicated nasal sprays for best result  Reflux Continue dietary and lifestyle modifications as listed below Continue pantoprazole twice a day as previously prescribed May add famotidine 20 mg once a day for reflux symptoms. This may help to decrease cough  Call the clinic if this treatment plan is not working well for you  Follow up in 2 months or sooner if needed.   Return in about 2 months (around 02/26/2023), or if symptoms worsen or fail to improve.    Thank you for the opportunity to care for this patient.  Please do not hesitate to contact me with questions.  Crystal Morgan, FNP [facility]

## 2022-12-28 ENCOUNTER — Ambulatory Visit (INDEPENDENT_AMBULATORY_CARE_PROVIDER_SITE_OTHER): Payer: PPO | Admitting: Family Medicine

## 2022-12-28 ENCOUNTER — Other Ambulatory Visit: Payer: Self-pay

## 2022-12-28 ENCOUNTER — Encounter: Payer: Self-pay | Admitting: Family Medicine

## 2022-12-28 VITALS — BP 128/72 | HR 97 | Temp 98.0°F | Resp 20 | Ht 63.0 in | Wt 228.0 lb

## 2022-12-28 DIAGNOSIS — R053 Chronic cough: Secondary | ICD-10-CM | POA: Diagnosis not present

## 2022-12-28 DIAGNOSIS — K219 Gastro-esophageal reflux disease without esophagitis: Secondary | ICD-10-CM

## 2022-12-28 DIAGNOSIS — J3089 Other allergic rhinitis: Secondary | ICD-10-CM | POA: Diagnosis not present

## 2022-12-28 MED ORDER — FLUTICASONE PROPIONATE 50 MCG/ACT NA SUSP: NASAL | 1 refills | Status: DC

## 2022-12-28 MED ORDER — FAMOTIDINE 20 MG PO TABS: 20.0000 mg | ORAL_TABLET | Freq: Every day | ORAL | 1 refills | Status: DC

## 2022-12-28 NOTE — Patient Instructions (Addendum)
Cough Continue albuterol 2 puffs once every 4 hours as needed for cough or wheeze You may use albuterol 2 puffs 5-15 minutes before activity to decrease cough or wheeze Keep up the coughing journal. If your symptoms re-occur, begin a journal of events that occurred for up to 6 hours before your symptoms began including foods and beverages consumed, soaps or perfumes you had contact with, and medications.   Allergic rhinitis/non-allergic rhinitis Continue ipratropium nasal spray up to three times a day as needed for a runny nose Use Flonase nasal spray 1-2 sprays in each nostril once a day as needed for a stuffy nose or drainage Consider saline nasal rinses as needed for nasal symptoms. Use this before any medicated nasal sprays for best result  Reflux Continue dietary and lifestyle modifications as listed below Continue pantoprazole twice a day as previously prescribed May add famotidine 20 mg once a day for reflux symptoms. This may help to decrease cough  Call the clinic if this treatment plan is not working well for you  Follow up in 2 months or sooner if needed.

## 2023-01-04 ENCOUNTER — Encounter: Payer: Self-pay | Admitting: Family Medicine

## 2023-01-09 ENCOUNTER — Telehealth: Payer: Self-pay | Admitting: Nurse Practitioner

## 2023-01-09 DIAGNOSIS — R7989 Other specified abnormal findings of blood chemistry: Secondary | ICD-10-CM

## 2023-01-09 DIAGNOSIS — E0590 Thyrotoxicosis, unspecified without thyrotoxic crisis or storm: Secondary | ICD-10-CM | POA: Diagnosis not present

## 2023-01-09 NOTE — Telephone Encounter (Signed)
done

## 2023-01-09 NOTE — Telephone Encounter (Signed)
Can you update pt's labs?

## 2023-01-10 LAB — T4, FREE: Free T4: 1.7 ng/dL (ref 0.82–1.77)

## 2023-01-10 LAB — TSH: TSH: 0.696 u[IU]/mL (ref 0.450–4.500)

## 2023-01-10 LAB — T3, FREE: T3, Free: 3.5 pg/mL (ref 2.0–4.4)

## 2023-01-12 ENCOUNTER — Ambulatory Visit (INDEPENDENT_AMBULATORY_CARE_PROVIDER_SITE_OTHER): Payer: PPO | Admitting: Nurse Practitioner

## 2023-01-12 ENCOUNTER — Encounter: Payer: Self-pay | Admitting: Nurse Practitioner

## 2023-01-12 VITALS — BP 143/80 | HR 60 | Ht 63.0 in | Wt 222.0 lb

## 2023-01-12 DIAGNOSIS — E0590 Thyrotoxicosis, unspecified without thyrotoxic crisis or storm: Secondary | ICD-10-CM | POA: Diagnosis not present

## 2023-01-12 NOTE — Progress Notes (Signed)
01/12/2023     Endocrinology Follow Up Note      Subjective:    Patient ID: Crystal [name], female    DOB: 05-24-1941, PCP Asencion Noble, MD.   Past Medical History:  Diagnosis Date   Anxiety    Arthritis    Breast cancer ([place]) 03/21/15   right   Chronic fatigue    Colitis, ischemic ([place]) 08/2007, 05/2011   Last colonoscopy/bx by Dr Rourk->(08/31/07) descending colon   Depression    Diverticulosis    Enlarged thyroid    Gait abnormality 10/18/2017   GERD (gastroesophageal reflux disease)    Gout    HTN (hypertension)    Hypercholesteremia    Lumbar spinal stenosis 11/07/2017   Severe at L4-5, moderate at L3-4   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   OA (osteoarthritis)    Obesity    PONV (postoperative nausea and vomiting)    Ptosis of eyelid, left    Sleep apnea    cpap   Spinal stenosis    Tubular adenoma of colon 08/2007    Past Surgical History:  Procedure Laterality Date   ABDOMINAL HYSTERECTOMY     partial   BREAST CYST EXCISION     benign, left x2, right x1   CATARACT EXTRACTION W/PHACO  12/01/2011   Procedure: CATARACT EXTRACTION PHACO AND INTRAOCULAR LENS PLACEMENT ([place]);  Surgeon: Tonny Branch;  Location: [facility];  Service: Ophthalmology;  Laterality: Right;  CDE=14.87   CATARACT EXTRACTION W/PHACO  02/06/2012   Procedure: CATARACT EXTRACTION PHACO AND INTRAOCULAR LENS PLACEMENT ([facility]);  Surgeon: Tonny Branch, MD;  Location: [facility];  Service: Ophthalmology;  Laterality: Left;  CDE 13.00   COLONOSCOPY  08/2007   Last colonoscopy/bx by Dr Rourk->(08/31/07) descending colon TUBULAR ADENOMA   COLONOSCOPY  10/25/2012   Procedure: COLONOSCOPY;  Surgeon: Daneil Dolin, MD;  Location: [facility];  Service: Endoscopy;  Laterality: N/A;  10:15-changed to 10:30 Leigh Ann notified   PARTIAL MASTECTOMY WITH NEEDLE LOCALIZATION AND AXILLARY SENTINEL LYMPH NODE BX Right 03/18/2015   Procedure: PARTIAL MASTECTOMY AFTER NEEDLE LOCALIZATION AND AXILLARY SENTINEL LYMPH NODE BX;   Surgeon: Aviva Signs Md, MD;  Location: [facility];  Service: General;  Laterality: Right;   PTOSIS REPAIR Left 05/11/2017   Procedure: INTERNAL PTOSIS REPAIR OF LEFT EYELID;  Surgeon: Clista Bernhardt, MD;  Location: [facility];  Service: Ophthalmology;  Laterality: Left;   TOOTH EXTRACTION  12/14/2021   TUBAL LIGATION  1970   hysterectomy   TUBAL LIGATION      Social History   Socioeconomic History   Marital status: Widowed    Spouse name: Not on file   Number of children: 3   Years of education: 75   Highest education level: Not on file  Occupational History   Occupation: Lab Tech-retired  Tobacco Use   Smoking status: Never   Smokeless tobacco: Never  Vaping Use   Vaping Use: Never used  Substance and Sexual Activity   Alcohol use: No   Drug use: No   Sexual activity: Not Currently    Birth control/protection: Surgical  Other Topics Concern   Not on file  Social History Narrative   Lives alone   Caffeine use: less than 8 oz per week   Right handed    Social Determinants of Health   Financial Resource Strain: Not on file  Food Insecurity: Not on file  Transportation Needs: Not on file  Physical Activity: Not on file  Stress: Not on  file  Social Connections: Not on file    Family History  Problem Relation Age of Onset   Cancer Brother 65   Coronary artery disease Brother    Diabetes Sister    Heart disease Father    Heart disease Brother    Anesthesia problems Neg Hx    Hypotension Neg Hx    Pseudochol deficiency Neg Hx    Malignant hyperthermia Neg Hx    Colon cancer Neg Hx     Outpatient Encounter Medications as of 01/12/2023  Medication Sig   acetaminophen (TYLENOL) 650 MG CR tablet Take 1,300 mg by mouth 2 (two) times daily.   amLODipine (NORVASC) 10 MG tablet Take 10 mg by mouth daily.   Artificial Tear Solution (SOOTHE XP OP) Place 1 drop into both eyes 2 (two) times daily.   buPROPion (WELLBUTRIN SR) 150 MG 12 hr tablet Take 150 mg by mouth 2 (two)  times daily.   Calcium-Vitamin D (CALTRATE 600 PLUS-VIT D PO) Take 1 tablet by mouth daily.   chlorthalidone (HYGROTON) 25 MG tablet Take 25 mg by mouth every morning.   famotidine (PEPCID) 20 MG tablet Take 1 tablet (20 mg total) by mouth daily.   FLUoxetine (PROZAC) 20 MG capsule Take 20 mg by mouth daily.   fluticasone (FLONASE) 50 MCG/ACT nasal spray Use 1 to 2 sprays in each nostril once a day as needed for stuffy nose.  In the right nostril, point the applicator out toward the right ear. In the left nostril, point the applicator out toward the left ear   ipratropium (ATROVENT) 0.03 % nasal spray USE 2 SPRAYS IN BOTH  NOSTRILS 3 TIMES DAILY   loratadine (ALLERGY) 10 MG tablet Take 10 mg by mouth daily.   metoprolol succinate (TOPROL-XL) 25 MG 24 hr tablet Take 25 mg by mouth daily.   montelukast (SINGULAIR) 10 MG tablet TAKE 1 TABLET BY MOUTH AT  BEDTIME   pantoprazole (PROTONIX) 40 MG tablet TAKE 1 TABLET BY MOUTH TWICE DAILY (Patient taking differently: 40 mg 2 (two) times daily.)   simvastatin (ZOCOR) 10 MG tablet Take 10 mg by mouth at bedtime.    albuterol (PROAIR HFA) 108 (90 Base) MCG/ACT inhaler Inhale 2 puffs into the lungs every 4 (four) hours as needed for wheezing or shortness of breath. (Patient not taking: Reported on 12/28/2022)   gabapentin (NEURONTIN) 100 MG capsule Take 1 capsule (100 mg total) by mouth 3 (three) times daily.   No facility-administered encounter medications on file as of 01/12/2023.    ALLERGIES: Allergies  Allergen Reactions   Bactrim [Sulfamethoxazole-Trimethoprim] Nausea Only   Gabapentin Swelling    Patient reports that it caused swelling in her ankles    VACCINATION STATUS: Immunization History  Administered Date(s) Administered   Influenza Split 10/18/2013, 09/15/2014   Influenza, High Dose Seasonal PF 10/23/2018   Influenza-Unspecified 09/10/2015   Moderna Sars-Covid-2 Vaccination 02/19/2020, 03/18/2020, 12/10/2020   Pneumococcal  Conjugate-13 12/25/2014     HPI  Crystal [name] is 82 y.o. female who presents today with a medical history as above. she is being seen in follow up after being seen in consultation for hyperthyroidism requested by Asencion Noble, MD.  she has been dealing with symptoms of chronic cough, intermittent trouble sleeping for month/years. These symptoms are progressively worsening and troubling to her.  her most recent thyroid labs revealed suppressed TSH of 0.098 on 03/22/21.  She reports trouble breathing when she goes to the dentist and lays back with mouth open.  She gets her thyroid labs done every year and this is the first time anything was abnormal. she denies dysphagia, choking, shortness of breath, no recent voice change.    she reports family history of thyroid dysfunction in her mother with significant goiter, but denies family hx of thyroid cancer. she denies personal history of goiter. she is not on any anti-thyroid medications nor on any thyroid hormone supplements. Denies use of Biotin containing supplements.  she is willing to proceed with appropriate work up and therapy for thyrotoxicosis.   Review of systems  Constitutional: + Minimally fluctuating body weight,  current Body mass index is 39.33 kg/m. , no fatigue, no subjective hyperthermia, no subjective hypothermia Eyes: no blurry vision, no xerophthalmia ENT: no sore throat, no nodules palpated in throat, no dysphagia/odynophagia, no hoarseness Cardiovascular: no chest pain, no shortness of breath, no palpitations, no leg swelling Respiratory: no cough, no shortness of breath Gastrointestinal: no nausea/vomiting/diarrhea Musculoskeletal: no muscle/joint aches, walks with rollator Skin: no rashes, no hyperemia Neurological: no tremors, no numbness, no tingling, no dizziness Psychiatric: no depression, no anxiety   Objective:    BP (!) 143/80 (BP Location: Left Arm, Patient Position: Sitting, Cuff Size: Large)   Pulse 60   Ht  '5\' 3"'$  (1.6 m)   Wt 222 lb (100.7 kg)   BMI 39.33 kg/m   Wt Readings from Last 3 Encounters:  01/12/23 222 lb (100.7 kg)  12/28/22 228 lb (103.4 kg)  12/12/22 228 lb 6.4 oz (103.6 kg)     BP Readings from Last 3 Encounters:  01/12/23 (!) 143/80  12/28/22 128/72  12/12/22 136/84     Physical Exam- Limited  Constitutional:  Body mass index is 39.33 kg/m. , not in acute distress, normal state of mind Eyes:  EOMI, no exophthalmos Musculoskeletal: no gross deformities, strength intact in all four extremities, no gross restriction of joint movements Skin:  no rashes, no hyperemia Neurological: no tremor with outstretched hands    CMP     Component Value Date/Time   NA 139 03/25/2021 1015   K 3.4 (L) 03/25/2021 1015   CL 100 03/25/2021 1015   CO2 29 03/25/2021 1015   GLUCOSE 92 03/25/2021 1015   BUN 20 03/25/2021 1015   CREATININE 0.90 03/25/2021 1015   CALCIUM 9.6 03/25/2021 1015   PROT 7.1 03/25/2021 1015   ALBUMIN 4.0 03/25/2021 1015   AST 25 03/25/2021 1015   ALT 20 03/25/2021 1015   ALKPHOS 57 03/25/2021 1015   BILITOT 0.7 03/25/2021 1015   GFRNONAA >60 03/25/2021 1015   GFRAA >60 08/27/2020 1025     CBC    Component Value Date/Time   WBC 5.4 03/25/2021 1015   RBC 4.20 03/25/2021 1015   HGB 13.3 03/25/2021 1015   HCT 40.5 03/25/2021 1015   PLT 212 03/25/2021 1015   MCV 96.4 03/25/2021 1015   MCH 31.7 03/25/2021 1015   MCHC 32.8 03/25/2021 1015   RDW 12.9 03/25/2021 1015   LYMPHSABS 1.5 03/25/2021 1015   MONOABS 0.5 03/25/2021 1015   EOSABS 0.2 03/25/2021 1015   BASOSABS 0.0 03/25/2021 1015     Diabetic Labs (most recent): No results found for: "HGBA1C", "MICROALBUR"  Lipid Panel  No results found for: "CHOL", "TRIG", "HDL", "CHOLHDL", "VLDL", "LDLCALC", "LDLDIRECT", "LABVLDL"   Lab Results  Component Value Date   TSH 0.696 01/09/2023   TSH 0.801 01/04/2022   TSH 0.847 07/05/2021   TSH 0.045 (L) 04/08/2021   TSH 0.10 (A) 03/22/2021  TSH  1.31 03/19/2020   FREET4 1.70 01/09/2023   FREET4 1.48 01/04/2022   FREET4 1.43 07/05/2021   FREET4 1.72 04/08/2021      Thyroid Ultrasound from 02/22/21 CLINICAL DATA:  Palpable abnormality.  Palpable thyroid nodule.   EXAM: THYROID ULTRASOUND   TECHNIQUE: Ultrasound examination of the thyroid gland and adjacent soft tissues was performed.   COMPARISON:  Chest CT-07/20/2020   FINDINGS: Parenchymal Echotexture: Markedly heterogenous   Isthmus: Enlarged measuring 1.1 cm in diameter   Right lobe: Enlarged measuring 8.6 x 3.4 x 3.6 cm   Left lobe: Enlarged measuring 6.1 x 3.9 x 3.6 cm   _________________________________________________________   Estimated total number of nodules >/= 1 cm: 1   Number of spongiform nodules >/=  2 cm not described below (TR1): 0   Number of mixed cystic and solid nodules >/= 1.5 cm not described below (TR2): 0  _________________________________________________________   Questioned 3.9 x 3.8 x 3.2 cm isoechoic ill-defined nodule/mass within the left lobe of the thyroid is favored to represent a pseudonodule as it lacks defined borders on both provided transverse and sagittal images, likely accentuated due to marked heterogeneity of the thyroid parenchymal echotexture.   IMPRESSION: Enlarged and markedly heterogeneous thyroid without discrete worrisome nodule or mass.   The above is in keeping with the ACR TI-RADS recommendations - J Am Coll Radiol 2017;14:587-595.     Electronically Signed   By: Sandi Mariscal M.D.   On: 02/22/2021 11:47 ---------------------------------------------------------------------------------------------------------------------------  UPTAKE AND SCAN RESULTS FROM 05/05/21 CLINICAL DATA:  Suppressed TSH, suspected hyperthyroidism   EXAM: THYROID SCAN AND UPTAKE - 4 AND 24 HOURS   TECHNIQUE: Following oral administration of I-123 capsule, anterior planar imaging was acquired at 24 hours. Thyroid uptake was  calculated with a thyroid probe at 4-6 hours and 24 hours.   RADIOPHARMACEUTICALS:  446 uCi I-123 sodium iodide p.o.   COMPARISON:  None   Correlation: Thyroid ultrasound 02/22/2021   FINDINGS: Heterogeneous tracer uptake throughout both thyroid lobes. Areas of cold nodules are seen at the upper pole and lower pole LEFT lobe as well as lower pole RIGHT lobe. Small foci of focally increased tracer uptake at isthmus and RIGHT upper pole. Findings are consistent with a multinodular thyroid gland. No discrete thyroid nodules identified at the sites on ultrasound.   4 hour I-123 uptake = 7.8% (normal 5-20%)   24 hour I-123 uptake = 25.9% (normal 10-30%)   IMPRESSION: Enlarged multinodular thyroid gland.   Normal 4 hour and 24 hour radio iodine uptakes.     Electronically Signed   By: Lavonia Dana M.D.   On: 05/05/2021 11:51    Latest Reference Range & Units 04/08/21 14:31 07/05/21 10:21 01/04/22 08:21 01/09/23 11:08  TSH 0.450 - 4.500 uIU/mL 0.045 (L) 0.847 0.801 0.696  Triiodothyronine,Free,Serum 2.0 - 4.4 pg/mL 3.5 3.0 3.2 3.5  T4,Free(Direct) 0.82 - 1.77 ng/dL 1.72 1.43 1.48 1.70  Thyroperoxidase Ab SerPl-aCnc 0 - 34 IU/mL <8     Thyroglobulin Antibody 0.0 - 0.9 IU/mL <1.0     (L): Data is abnormally low  Assessment & Plan:   1. Subclinical hyperthyroidism-resolved  she is being seen at a kind request of Asencion Noble, MD.  her history and most recent labs are reviewed, and she was examined clinically.    Her uptake and scan results showed grossly enlarged thyroid, no hot or cold nodules, and normal uptake at 4 hr (7.8%) and 24 hrs (25.9%).   Her thyroid ultrasound shows enlarged  thyroid, however given her age and comorbidities, surgery is not an ideal option for her.  Conservative management is recommended at this time.  Her repeat thyroid function tests are normal.  Given her stable thyroid function tests, she can continue annual surveillance at this time with her PCP  and reach back out if needed.    -Patient is advised to maintain close follow up with Asencion Noble, MD for primary care needs.      I spent 34 minutes in the care of the patient today including review of labs from Thyroid Function, CMP, and other relevant labs ; imaging/biopsy records (current and previous including abstractions from other facilities); face-to-face time discussing  her lab results and symptoms, medications doses, her options of short and long term treatment based on the latest standards of care / guidelines;   and documenting the encounter.  Crystal [name]  participated in the discussions, expressed understanding, and voiced agreement with the above plans.  All questions were answered to her satisfaction. she is encouraged to contact clinic should she have any questions or concerns prior to her return visit.    Follow up plan: Return if symptoms worsen or fail to improve.   Thank you for involving me in the care of this pleasant patient, and I will continue to update you with her progress.   Rayetta Pigg, [facility] [address] [place], [place] [address] Phone: [phone] Fax: [phone]   01/12/2023, 11:04 AM

## 2023-03-01 DIAGNOSIS — J45909 Unspecified asthma, uncomplicated: Secondary | ICD-10-CM | POA: Diagnosis not present

## 2023-03-01 DIAGNOSIS — G8929 Other chronic pain: Secondary | ICD-10-CM | POA: Diagnosis not present

## 2023-03-01 DIAGNOSIS — K219 Gastro-esophageal reflux disease without esophagitis: Secondary | ICD-10-CM | POA: Diagnosis not present

## 2023-03-01 DIAGNOSIS — G620 Drug-induced polyneuropathy: Secondary | ICD-10-CM | POA: Diagnosis not present

## 2023-03-01 DIAGNOSIS — I10 Essential (primary) hypertension: Secondary | ICD-10-CM | POA: Diagnosis not present

## 2023-03-01 DIAGNOSIS — G4733 Obstructive sleep apnea (adult) (pediatric): Secondary | ICD-10-CM | POA: Diagnosis not present

## 2023-03-01 DIAGNOSIS — F419 Anxiety disorder, unspecified: Secondary | ICD-10-CM | POA: Diagnosis not present

## 2023-03-01 DIAGNOSIS — E785 Hyperlipidemia, unspecified: Secondary | ICD-10-CM | POA: Diagnosis not present

## 2023-03-01 DIAGNOSIS — F3342 Major depressive disorder, recurrent, in full remission: Secondary | ICD-10-CM | POA: Diagnosis not present

## 2023-03-01 DIAGNOSIS — H9193 Unspecified hearing loss, bilateral: Secondary | ICD-10-CM | POA: Diagnosis not present

## 2023-03-01 DIAGNOSIS — J309 Allergic rhinitis, unspecified: Secondary | ICD-10-CM | POA: Diagnosis not present

## 2023-03-08 ENCOUNTER — Other Ambulatory Visit: Payer: Self-pay

## 2023-03-08 ENCOUNTER — Ambulatory Visit (INDEPENDENT_AMBULATORY_CARE_PROVIDER_SITE_OTHER): Payer: HMO | Admitting: Allergy & Immunology

## 2023-03-08 ENCOUNTER — Encounter: Payer: Self-pay | Admitting: Allergy & Immunology

## 2023-03-08 VITALS — BP 138/88 | HR 65 | Temp 97.9°F | Resp 20 | Ht 63.0 in | Wt 222.0 lb

## 2023-03-08 DIAGNOSIS — R0981 Nasal congestion: Secondary | ICD-10-CM | POA: Diagnosis not present

## 2023-03-08 DIAGNOSIS — K219 Gastro-esophageal reflux disease without esophagitis: Secondary | ICD-10-CM | POA: Diagnosis not present

## 2023-03-08 DIAGNOSIS — J310 Chronic rhinitis: Secondary | ICD-10-CM | POA: Diagnosis not present

## 2023-03-08 DIAGNOSIS — R053 Chronic cough: Secondary | ICD-10-CM | POA: Diagnosis not present

## 2023-03-08 MED ORDER — GABAPENTIN 100 MG PO CAPS: 100.0000 mg | ORAL_CAPSULE | Freq: Three times a day (TID) | ORAL | 0 refills | Status: DC

## 2023-03-08 NOTE — Progress Notes (Signed)
FOLLOW UP  Date of Service/Encounter:  03/08/23   Assessment:   Chronic cough - likely multifactorial in nature (restarting gabapentin)   Perennial allergic rhinitis (cat) - without relevant clinical history   History of stage Ia right breast cancer status post lumpectomy in February 2016 - recently discontinued anastrozole September 2021   Osteopenia  History of OSA - was on CPAP for 25 + years  Plan/Recommendations:   1. Chronic cough - Lung testing has looked normal in the past.  - Restart gabapentin '100mg'$  three times daily.  - Let us know if that happens.   2. Chronic rhinitis - Previous testing showed: cat - Continue taking: Atrovent (ipratropium) one spray per nostril up to [place], but this can be OVER DRYING, so be careful!  - Continue taking chlorpheniramine to help with the postnasal drip.  - We are going to work on getting a sinus CT due to this chronic congestion of your sinuses.    3. Return in about 4 weeks (around 04/05/2023).    Subjective:   Crystal [name] is a 82 y.o. female presenting today for follow up of  Chief Complaint  Patient presents with   Follow-up   Cough    PT states she has a dry cough that comes and goes and the meds called in hasn't helped.    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  Crystal [name] is a 82 y.o. female presenting for a follow up visit.  She was last seen in  January 2024 Crystal [name], one of our esteemed Chemical engineer.  For her cough, she was continued on albuterol as needed.  For her mixed rhinitis, she was continued on ipratropium and Flonase.  For reflux, she was continued on pantoprazole with Pepcid added.  There was discussion at past visits about restarting gabapentin which was prescribed by Dr. Melvyn Novas.  Since last visit, she continues to have this cough. It has been going on for years. Nothing seems to help at all. Albuterol does not help.  She coughs and coughs for two weeks and then she stops coughing for two weeks. She feels that there is something that drains down her throat. She is not sure that if the inhaler helps at all. There is a timing with the treatment and improvement, but it is hard to pin down what the triggers are when the coughing resumed. She does not notice anything that makes sense in the timing. Cat was on the only thing positive on testing; she has not had a cat in years. Although it should be noted that she had 21 cats at one point in her life.   Of note, she was on a CPAP machine a long time ago. She used it faithfully for 25 years. Then it become so stopped up and she could not use the CPAP at all. She has lost some weight and she continues to sleep well even without the CPAP.  She does see Dr. Melvyn Novas. The last appointment was before she saw [place]. Dr. Melvyn Novas took "care of everything below the neck" and he he said that we would take care of everything "above the neck". She was on gabapentin for one week and her ankles started swelling. She is not sure that this was related to the swelling of her ankles.   Otherwise, there have been no changes to her past medical history, surgical history, family history, or social history.    Review of Systems  Constitutional: Negative.  Negative for chills, fever, malaise/fatigue and weight loss.  HENT: Negative.  Negative for congestion, ear discharge and ear pain.   Eyes:  Negative for  pain, discharge and redness.  Respiratory:  Positive for cough. Negative for sputum production, shortness of breath and wheezing.   Cardiovascular: Negative.  Negative for chest pain and palpitations.  Gastrointestinal:  Negative for abdominal pain, constipation, diarrhea, heartburn, nausea and vomiting.  Skin: Negative.  Negative for itching and rash.  Neurological:  Negative for dizziness and headaches.  Endo/Heme/Allergies:  Negative for environmental allergies. Does not bruise/bleed easily.       Objective:   Blood pressure 138/88, pulse 65, temperature 97.9 F (36.6 C), resp. rate 20, height '5\' 3"'$  (1.6 m), weight 222 lb (100.7 kg), SpO2 96 %. Body mass index is 39.33 kg/m.    Physical Exam Vitals reviewed.  Constitutional:      Appearance: She is well-developed.  HENT:     Head: Normocephalic and atraumatic.     Right Ear: Tympanic membrane, ear canal and external ear normal. No drainage, swelling or tenderness. Tympanic membrane is not injected, scarred, erythematous, retracted or bulging.     Left Ear: Tympanic membrane, ear canal and external ear normal. No drainage, swelling or tenderness. Tympanic membrane is not injected, scarred, erythematous, retracted or bulging.     Nose: Rhinorrhea present. No nasal deformity, septal deviation or mucosal edema.     Right Turbinates: Enlarged, swollen and pale.     Left Turbinates: Enlarged, swollen and pale.     Right Sinus: No maxillary sinus tenderness or frontal sinus tenderness.     Left Sinus: No maxillary sinus tenderness or frontal sinus tenderness.     Comments: She does have quite a bit of clear rhinorrhea.    Mouth/Throat:     Mouth: Mucous membranes are not pale and not dry.     Pharynx: Uvula midline.  Eyes:     General: Lids are normal. Gaze aligned appropriately. No allergic shiner.       Right eye: No discharge.        Left eye: No discharge.     Conjunctiva/sclera: Conjunctivae normal.     Right eye: Right  conjunctiva is not injected. No chemosis.    Left eye: Left conjunctiva is not injected. No chemosis.    Pupils: Pupils are equal, round, and reactive to light.  Cardiovascular:     Rate and Rhythm: Normal rate and regular rhythm.     Heart sounds: Normal heart sounds.  Pulmonary:     Effort: Pulmonary effort is normal. No tachypnea, accessory muscle usage or respiratory distress.     Breath sounds: Normal breath sounds. No wheezing, rhonchi or rales.     Comments: Intermittent coarse upper airway sounds.  No wheezing.  Moving air well in all lung fields. Chest:     Chest wall: No tenderness.  Abdominal:     Tenderness: There is no abdominal tenderness. There is  no guarding or rebound.  Lymphadenopathy:     Head:     Right side of head: No submandibular, tonsillar or occipital adenopathy.     Left side of head: No submandibular, tonsillar or occipital adenopathy.     Cervical: No cervical adenopathy.  Skin:    Coloration: Skin is not pale.     Findings: No abrasion, erythema, petechiae or rash. Rash is not papular, urticarial or vesicular.  Neurological:     Mental Status: She is alert.  Psychiatric:        Behavior: Behavior is cooperative.      Diagnostic studies: none     Salvatore Marvel, MD  [facility] [place]

## 2023-03-08 NOTE — Patient Instructions (Addendum)
1. Chronic cough - Lung testing has looked normal in the past.  - Restart gabapentin '100mg'$  three times daily.  - Let us know if that happens.   2. Chronic rhinitis - Previous testing showed: cat - Continue taking: Atrovent (ipratropium) one spray per nostril up to [place], but this can be OVER DRYING, so be careful!  - Continue taking chlorpheniramine to help with the postnasal drip.  - We are going to work on getting a sinus CT due to this chronic congestion of your sinuses.    3. Return in about 4 weeks (around 04/05/2023).    Please inform us of any Emergency Department visits, hospitalizations, or changes in symptoms. Call us before going to the ED for breathing or allergy symptoms since we might be able to fit you in for a sick visit. Feel free to contact us anytime with any questions, problems, or concerns.  It was a pleasure to see you again today!  Websites that have reliable patient information: 1. American Academy of Asthma, Allergy, and Immunology: www.aaaai.org 2. Food Allergy Research and Education (FARE): foodallergy.org 3. Mothers of Asthmatics: http://www.asthmacommunitynetwork.org 4. American College of Allergy, Asthma, and Immunology: www.acaai.org   COVID-19 Vaccine Information can be found at: ShippingScam.co.uk For questions related to vaccine distribution or appointments, please email [email or web address] or call [phone].   We realize that you might be concerned about having an allergic reaction to the COVID19 vaccines. To help with that concern, WE ARE OFFERING THE COVID19 VACCINES IN OUR OFFICE! Ask the front desk for dates!     "Like" [place] on Facebook and Instagram for our latest updates!      A healthy democracy works best when [employer] participate! Make sure you are registered to vote! If you have moved or changed any of your contact information, you will need to get this updated  before voting!  In some cases, you MAY be able to register to vote online: [email or web address]

## 2023-03-16 ENCOUNTER — Other Ambulatory Visit: Payer: Self-pay | Admitting: *Deleted

## 2023-03-16 MED ORDER — IPRATROPIUM BROMIDE 0.03 % NA SOLN: NASAL | 5 refills | Status: DC

## 2023-04-12 ENCOUNTER — Encounter: Payer: Self-pay | Admitting: Family Medicine

## 2023-04-12 ENCOUNTER — Ambulatory Visit (INDEPENDENT_AMBULATORY_CARE_PROVIDER_SITE_OTHER): Payer: HMO | Admitting: Family Medicine

## 2023-04-12 ENCOUNTER — Other Ambulatory Visit: Payer: Self-pay

## 2023-04-12 VITALS — BP 150/60 | HR 86 | Temp 98.0°F | Resp 16

## 2023-04-12 DIAGNOSIS — I499 Cardiac arrhythmia, unspecified: Secondary | ICD-10-CM

## 2023-04-12 DIAGNOSIS — J310 Chronic rhinitis: Secondary | ICD-10-CM | POA: Insufficient documentation

## 2023-04-12 DIAGNOSIS — J3089 Other allergic rhinitis: Secondary | ICD-10-CM

## 2023-04-12 DIAGNOSIS — R053 Chronic cough: Secondary | ICD-10-CM | POA: Diagnosis not present

## 2023-04-12 DIAGNOSIS — K219 Gastro-esophageal reflux disease without esophagitis: Secondary | ICD-10-CM

## 2023-04-12 NOTE — Patient Instructions (Addendum)
Cough/asthma Continue albuterol 2 puffs once every 4 hours as needed for cough or wheeze You may use albuterol 2 puffs 5-15 minutes before activity to decrease cough or wheeze Keep up the coughing journal. If your symptoms re-occur, begin a journal of events that occurred for up to 6 hours before your symptoms began including foods and beverages consumed, soaps or perfumes you had contact with, and medications.  Call the clinic if the cough returns and we will move forward with a sinus CT scan.   Allergic rhinitis/non-allergic rhinitis Continue ipratropium nasal spray up to three times a day as needed for a runny nose Use Flonase nasal spray 1-2 sprays in each nostril once a day as needed for a stuffy nose or drainage Consider saline nasal rinses as needed for nasal symptoms. Use this before any medicated nasal sprays for best result  Reflux Continue dietary and lifestyle modifications as listed below Continue pantoprazole twice a day as previously prescribed May add famotidine 20 mg once a day for reflux symptoms. This may help to decrease cough  Call your primary care provider to report your regularly irregular heart rate.   Call the clinic if this treatment plan is not working well for you  Follow up in 6 months or sooner if needed.

## 2023-04-12 NOTE — Progress Notes (Signed)
[address] [place] [address] Dept: [phone]  FOLLOW UP NOTE  Patient ID: Crystal [name], female    DOB: 09-14-1941  Age: 82 y.o. MRN: [id number] Date of Office Visit: 04/12/2023  Assessment  Chief Complaint: Cough (Cough has been doing better. )  HPI Crystal [name] is an 82 year old female who presents to the clinic for follow-up visit.  She was last seen in this clinic on 03/08/2023 by Dr. Dellis Anes for evaluation of asthma, chronic cough, allergic rhinitis, reflux, history of breast cancer and current osteopenia.  Her last environmental allergy testing was on 03/24/2021 and was positive to cats. She is accompanied by her daughter who assists with history.   At today's visit, she reports that she has not been experiencing cough over the last several weeks. She reports her breathing has also been well controlled with no wheeze or shortness of breath. She continues montelukast 10 mg once a day and has not used albuterol since her last visit to this building. She continues gabapentin 100 mg three times a day. She does report that she has been having issues with balance well before she restarted gabapentin.   Allergic rhinitis is reported as moderately well controlled with symptoms including occasional symptoms including nasal congestion and clear rhinorrhea. She continues Flonase and azelastine daily. She is not currently taking an antihistamine. Her last environmental allergy testing was on 03/24/2021 and was positive to cats only. She does not have any cats in her home.   Reflux is reported as well controlled with no symptoms including heartburn or vomiting. She continues pantoprazole and famotidine with relief of symptoms.   Her current medications are listed in the chart.   Drug Allergies:  Allergies  Allergen Reactions   Bactrim [Sulfamethoxazole-Trimethoprim] Nausea Only   Gabapentin Swelling    Patient reports that it caused swelling in her ankles     Physical Exam: BP (!) 150/60   Pulse 86   Temp 98 F (36.7 C)   Resp 16   SpO2 99%    Physical Exam Vitals reviewed.  Constitutional:      Appearance: Normal appearance.  HENT:     Head: Normocephalic and atraumatic.     Right Ear: Tympanic membrane normal.     Left Ear: Tympanic membrane normal.     Nose:     Comments: Bilateral nares slightly erythematous with clear nasal drainage noted. Pharynx normal. Ears normal.     Mouth/Throat:     Pharynx: Oropharynx is clear.  Eyes:     Conjunctiva/sclera: Conjunctivae normal.  Cardiovascular:     Rate and Rhythm: Normal rate. Rhythm irregular.     Heart sounds: Murmur heard.     Comments: Regularly irregular rhythm with murmur noted.  Pulmonary:     Effort: Pulmonary effort is normal.     Breath sounds: Normal breath sounds.     Comments: Lungs clear to auscultation Musculoskeletal:        General: Normal range of motion.     Cervical back: Normal range of motion and neck supple.  Skin:    General: Skin is warm and dry.  Neurological:     Mental Status: She is alert and oriented to person, place, and time.  Psychiatric:        Mood and Affect: Mood normal.        Behavior: Behavior normal.        Thought Content: Thought content normal.        Judgment:  Judgment normal.     Assessment and Plan: 1. Chronic cough   2. Gastroesophageal reflux disease, unspecified whether esophagitis present   3. Mixed rhinitis   4. Perennial allergic rhinitis   5. Irregular heart rhythm     Patient Instructions  Cough/asthma Continue albuterol 2 puffs once every 4 hours as needed for cough or wheeze You may use albuterol 2 puffs 5-15 minutes before activity to decrease cough or wheeze Keep up the coughing journal. If your symptoms re-occur, begin a journal of events that occurred for up to 6 hours before your symptoms began including foods and beverages consumed, soaps or perfumes you had contact with, and medications.  Call the  clinic if the cough returns and we will move forward with a sinus CT scan.   Allergic rhinitis/non-allergic rhinitis Continue ipratropium nasal spray up to three times a day as needed for a runny nose Use Flonase nasal spray 1-2 sprays in each nostril once a day as needed for a stuffy nose or drainage Consider saline nasal rinses as needed for nasal symptoms. Use this before any medicated nasal sprays for best result  Reflux Continue dietary and lifestyle modifications as listed below Continue pantoprazole twice a day as previously prescribed May add famotidine 20 mg once a day for reflux symptoms. This may help to decrease cough  Call your primary care provider to report your regularly irregular heart rate.   Call the clinic if this treatment plan is not working well for you  Follow up in 6 months or sooner if needed.  Return in about 6 months (around 10/12/2023), or if symptoms worsen or fail to improve.    Thank you for the opportunity to care for this patient.  Please do not hesitate to contact me with questions.  Thermon Leyland, FNP [facility]

## 2023-04-18 ENCOUNTER — Encounter: Payer: Self-pay | Admitting: Allergy & Immunology

## 2023-04-18 ENCOUNTER — Encounter: Payer: Self-pay | Admitting: Internal Medicine

## 2023-04-19 ENCOUNTER — Other Ambulatory Visit: Payer: Self-pay

## 2023-04-19 MED ORDER — FLUTICASONE PROPIONATE 50 MCG/ACT NA SUSP: NASAL | 5 refills | Status: DC

## 2023-04-19 MED ORDER — MONTELUKAST SODIUM 10 MG PO TABS: ORAL_TABLET | ORAL | 2 refills | Status: AC

## 2023-04-19 NOTE — Addendum Note (Signed)
Addended by: Dub Mikes on: 04/19/2023 06:09 PM   Modules accepted: Orders

## 2023-05-26 DIAGNOSIS — K219 Gastro-esophageal reflux disease without esophagitis: Secondary | ICD-10-CM | POA: Diagnosis not present

## 2023-05-26 DIAGNOSIS — I10 Essential (primary) hypertension: Secondary | ICD-10-CM | POA: Diagnosis not present

## 2023-05-26 DIAGNOSIS — E785 Hyperlipidemia, unspecified: Secondary | ICD-10-CM | POA: Diagnosis not present

## 2023-06-01 ENCOUNTER — Other Ambulatory Visit (HOSPITAL_COMMUNITY): Payer: Self-pay | Admitting: Internal Medicine

## 2023-06-01 DIAGNOSIS — Z1231 Encounter for screening mammogram for malignant neoplasm of breast: Secondary | ICD-10-CM

## 2023-06-01 NOTE — Addendum Note (Signed)
Addended by: Alfonse Spruce on: 06/01/2023 07:02 PM   Modules accepted: Orders

## 2023-06-02 ENCOUNTER — Telehealth: Payer: Self-pay | Admitting: *Deleted

## 2023-06-02 NOTE — Telephone Encounter (Signed)
Patient has been scheduled for a Sinus CT without Contrast at [facility] by Dr. Dellis Anes. Will need to check with insurance to see if a Prior Authorization is needed. Patient is aware that we will update her on the status and to get her scheduled.

## 2023-06-07 NOTE — Telephone Encounter (Signed)
Called and spoke with patients insurance. A Prior Authorization is not required, reference number [id number]. Called and spoke with [facility] and scheduled patients appointment for July 2nd at 9:30am, arrive at 9:15am. Called and advised patient of scheduled appointment for CT Scan. Patient verbalized understanding.

## 2023-06-08 MED ORDER — PREDNISONE 10 MG PO TABS: ORAL_TABLET | ORAL | 0 refills | Status: DC

## 2023-06-08 MED ORDER — BENZONATATE 100 MG PO CAPS: 100.0000 mg | ORAL_CAPSULE | Freq: Three times a day (TID) | ORAL | 0 refills | Status: DC | PRN

## 2023-06-08 NOTE — Addendum Note (Signed)
Addended by: Alfonse Spruce on: 06/08/2023 01:41 PM   Modules accepted: Orders

## 2023-06-08 NOTE — Telephone Encounter (Signed)
I sent in prednisone and lisinopril for the patient.

## 2023-06-12 ENCOUNTER — Encounter (HOSPITAL_COMMUNITY): Payer: Self-pay

## 2023-06-12 ENCOUNTER — Ambulatory Visit (HOSPITAL_COMMUNITY)
Admission: RE | Admit: 2023-06-12 | Discharge: 2023-06-12 | Disposition: A | Discharge status: Home / Self Care | Payer: HMO | Source: Ambulatory Visit | Attending: Internal Medicine | Admitting: Internal Medicine

## 2023-06-12 DIAGNOSIS — Z1231 Encounter for screening mammogram for malignant neoplasm of breast: Secondary | ICD-10-CM | POA: Diagnosis not present

## 2023-06-21 DIAGNOSIS — E049 Nontoxic goiter, unspecified: Secondary | ICD-10-CM | POA: Diagnosis not present

## 2023-06-21 DIAGNOSIS — E785 Hyperlipidemia, unspecified: Secondary | ICD-10-CM | POA: Diagnosis not present

## 2023-06-21 DIAGNOSIS — I10 Essential (primary) hypertension: Secondary | ICD-10-CM | POA: Diagnosis not present

## 2023-06-21 DIAGNOSIS — F329 Major depressive disorder, single episode, unspecified: Secondary | ICD-10-CM | POA: Diagnosis not present

## 2023-06-21 DIAGNOSIS — K219 Gastro-esophageal reflux disease without esophagitis: Secondary | ICD-10-CM | POA: Diagnosis not present

## 2023-06-21 DIAGNOSIS — C50919 Malignant neoplasm of unspecified site of unspecified female breast: Secondary | ICD-10-CM | POA: Diagnosis not present

## 2023-06-21 DIAGNOSIS — Z79899 Other long term (current) drug therapy: Secondary | ICD-10-CM | POA: Diagnosis not present

## 2023-06-26 DIAGNOSIS — F325 Major depressive disorder, single episode, in full remission: Secondary | ICD-10-CM | POA: Diagnosis not present

## 2023-06-26 DIAGNOSIS — E785 Hyperlipidemia, unspecified: Secondary | ICD-10-CM | POA: Diagnosis not present

## 2023-06-26 DIAGNOSIS — Z0001 Encounter for general adult medical examination with abnormal findings: Secondary | ICD-10-CM | POA: Diagnosis not present

## 2023-06-26 DIAGNOSIS — M79A19 Nontraumatic compartment syndrome of unspecified upper extremity: Secondary | ICD-10-CM | POA: Diagnosis not present

## 2023-06-26 DIAGNOSIS — R001 Bradycardia, unspecified: Secondary | ICD-10-CM | POA: Diagnosis not present

## 2023-06-26 DIAGNOSIS — I700 Atherosclerosis of aorta: Secondary | ICD-10-CM | POA: Diagnosis not present

## 2023-06-26 DIAGNOSIS — I10 Essential (primary) hypertension: Secondary | ICD-10-CM | POA: Diagnosis not present

## 2023-06-27 ENCOUNTER — Ambulatory Visit (HOSPITAL_COMMUNITY)
Admission: RE | Admit: 2023-06-27 | Discharge: 2023-06-27 | Disposition: A | Discharge status: Home / Self Care | Payer: HMO | Source: Ambulatory Visit | Attending: Allergy & Immunology | Admitting: Allergy & Immunology

## 2023-06-27 DIAGNOSIS — J310 Chronic rhinitis: Secondary | ICD-10-CM

## 2023-06-27 DIAGNOSIS — R0981 Nasal congestion: Secondary | ICD-10-CM

## 2023-06-27 DIAGNOSIS — R059 Cough, unspecified: Secondary | ICD-10-CM | POA: Diagnosis not present

## 2023-09-27 DIAGNOSIS — D3132 Benign neoplasm of left choroid: Secondary | ICD-10-CM | POA: Diagnosis not present

## 2023-10-11 ENCOUNTER — Other Ambulatory Visit: Payer: Self-pay

## 2023-10-11 ENCOUNTER — Encounter: Payer: Self-pay | Admitting: Allergy & Immunology

## 2023-10-11 ENCOUNTER — Ambulatory Visit (INDEPENDENT_AMBULATORY_CARE_PROVIDER_SITE_OTHER): Payer: HMO | Admitting: Allergy & Immunology

## 2023-10-11 VITALS — BP 130/60 | HR 86 | Temp 98.3°F | Resp 16 | Ht 63.0 in | Wt 220.0 lb

## 2023-10-11 DIAGNOSIS — K219 Gastro-esophageal reflux disease without esophagitis: Secondary | ICD-10-CM

## 2023-10-11 DIAGNOSIS — R053 Chronic cough: Secondary | ICD-10-CM

## 2023-10-11 DIAGNOSIS — J310 Chronic rhinitis: Secondary | ICD-10-CM

## 2023-10-11 MED ORDER — ALBUTEROL SULFATE HFA 108 (90 BASE) MCG/ACT IN AERS: 2.0000 | INHALATION_SPRAY | RESPIRATORY_TRACT | 2 refills | Status: DC | PRN

## 2023-10-11 NOTE — Progress Notes (Signed)
FOLLOW UP  Date of Service/Encounter:  10/11/23   Assessment:   Chronic cough - likely multifactorial in nature (restarting gabapentin)   Perennial allergic rhinitis (cat) - without relevant clinical history   History of stage Ia right breast cancer status post lumpectomy in February 2016 - recently discontinued anastrozole September 2021   Osteopenia   History of OSA - was on CPAP for 25 + years    Plan/Recommendations:    Cough/asthma Continue albuterol 2 puffs once every 4 hours as needed for cough or wheeze You may use albuterol 2 puffs 5-15 minutes before activity to decrease cough or wheeze Keep up the coughing journal. If your symptoms re-occur, begin a journal of events that occurred for up to 6 hours before your symptoms began including foods and beverages consumed, soaps or perfumes you had contact with, and medications.   Allergic rhinitis/non-allergic rhinitis HOLD the ipratropium nasal spray up to three times a day as needed for a runny nose HOLD the fluticasone nasal spray 1-2 sprays in each nostril once a day as needed for a stuffy nose or drainage You can always add this back if needed.   Reflux Continue dietary and lifestyle modifications as listed below Continue pantoprazole twice a day as previously prescribed Can add famotidine 20 mg once a day for reflux symptoms.   Return in about 6 months (around 04/10/2024). You can have the follow up appointment with Dr. Dellis Anes or a Nurse Practicioner (our Nurse Practitioners are excellent and always have Physician oversight!).   Subjective:   Crystal [name] is a 82 y.o. female presenting today for follow up of  Chief Complaint  Patient presents with   Follow-up   Cough    States she has this for 2 to 3 weeks and then it will stop. Feels like a tickle in throat.    Crystal [name] has a history of the following: Patient Active Problem List   Diagnosis Date Noted   Mixed rhinitis 04/12/2023   Irregular  heart rhythm 04/12/2023   Perennial allergic rhinitis 12/28/2022   DOE (dyspnea on exertion) 04/12/2022   Chronic cough 02/16/2021   Nodule of neck 02/16/2021   GERD (gastroesophageal reflux disease) 02/16/2021   Lumbar spinal stenosis 11/07/2017   Gait abnormality 10/18/2017   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   Osteopenia determined by x-ray 07/23/2015   Breast cancer (HCC) 03/31/2015   Colitis, ischemic (HCC) 06/27/2011   Tubular adenoma 06/27/2011    History obtained from: chart review and patient and daughter .  Discussed the use of AI scribe software for clinical note transcription with the patient and/or guardian, who gave verbal consent to proceed.  Crystal [name] is a 82 y.o. female presenting for a follow up visit.  Last seen in April 2024.  At that time, we continue with albuterol 2 puffs every 4 hours as needed.  For her allergic rhinitis, we continue with ipratropium up to 3 times daily as well as Flonase and nasal saline rinses.  We did add on famotidine 20 mg daily for her reflux symptoms.  We also continue with pantoprazole twice a day.  She had an irregular heart rate, so we recommended that she talk to her PCP about this.  The last visit, she has done well.   Asthma/Respiratory Symptom History: Miss Crystal [name], presents with a persistent cough described as 'coughing my head off.' The cough is a recurrent issue, lasting for a few weeks at a time before spontaneously resolving. Despite consistent medication use, the  cough remains unresponsive to treatment. The patient associates the cough with a sensation in the throat, often triggered by eating certain foods like cornbread.  She does have some sugar-free [employer] Ranchers that she uses a lot of coughing.  The patient also has a history of pulmonary consultations but was told there was nothing more that could be done. The patient also has albuterol at home but does not use it regularly as she does not experience significant shortness of breath. The  patient's cough does not appear to disrupt her sleep, and it eventually resolves on its own.   Allergic Rhinitis Symptom History: The patient uses nasal sprays, including Flonase and ipratropium, but finds them ineffective and reports that they cause excessive dryness in the throat.  She is open to stopping them.   GERD Symptom History: The patient has a history of gastroesophageal reflux disease (GERD) and is on pantoprazole twice daily. However, the patient denies any history of endoscopic evaluation. The patient also takes Citracal, a large pill, which she has learned to swallow with a specific technique to prevent it from getting stuck.  She lives in an apartment.  This seems to work well for her.  She has living independently, but she does report that her balance is not great.  She does have a walker in place.  She recently fell when she was going into her apartment, but thankfully her son and granddaughter were there to help her back up.  One of her daughters, who lives in [place], is here today.  She goes to visit her every Wednesday and they always go out to eat daughter cleans her apartment.  They are going to be going to [employer] today. Despite this, the patient does not express a desire for increased physical activity or exercise equipment.   Otherwise, there have been no changes to her past medical history, surgical history, family history, or social history.    Review of systems otherwise negative other than that mentioned in the HPI.    Objective:   Blood pressure 130/60, pulse 86, temperature 98.3 F (36.8 C), resp. rate 16, height 5\' 3"  (1.6 m), weight 220 lb (99.8 kg), SpO2 95%. Body mass index is 38.97 kg/m.    Physical Exam Vitals reviewed.  Constitutional:      Appearance: She is well-developed.  HENT:     Head: Normocephalic and atraumatic.     Right Ear: Tympanic membrane, ear canal and external ear normal. No drainage, swelling or tenderness. Tympanic membrane  is not injected, scarred, erythematous, retracted or bulging.     Left Ear: Tympanic membrane, ear canal and external ear normal. No drainage, swelling or tenderness. Tympanic membrane is not injected, scarred, erythematous, retracted or bulging.     Nose: No nasal deformity, septal deviation, mucosal edema or rhinorrhea.     Right Turbinates: Enlarged. Not swollen or pale.     Left Turbinates: Enlarged. Not swollen or pale.     Right Sinus: No maxillary sinus tenderness or frontal sinus tenderness.     Left Sinus: No maxillary sinus tenderness or frontal sinus tenderness.     Comments: She does have quite a bit of clear rhinorrhea.    Mouth/Throat:     Mouth: Mucous membranes are not pale and not dry.     Pharynx: Uvula midline.  Eyes:     General: Lids are normal. Gaze aligned appropriately. No allergic shiner.       Right eye: No discharge.  Left eye: No discharge.     Conjunctiva/sclera: Conjunctivae normal.     Right eye: Right conjunctiva is not injected. No chemosis.    Left eye: Left conjunctiva is not injected. No chemosis.    Pupils: Pupils are equal, round, and reactive to light.  Cardiovascular:     Rate and Rhythm: Normal rate and regular rhythm.     Heart sounds: Normal heart sounds.  Pulmonary:     Effort: Pulmonary effort is normal. No tachypnea, accessory muscle usage or respiratory distress.     Breath sounds: Normal breath sounds. No wheezing, rhonchi or rales.     Comments: Intermittent coarse upper airway sounds.  No wheezing.  Moving air well in all lung fields. Chest:     Chest wall: No tenderness.  Abdominal:     Tenderness: There is no abdominal tenderness. There is no guarding or rebound.  Lymphadenopathy:     Head:     Right side of head: No submandibular, tonsillar or occipital adenopathy.     Left side of head: No submandibular, tonsillar or occipital adenopathy.     Cervical: No cervical adenopathy.  Skin:    Coloration: Skin is not pale.      Findings: No abrasion, erythema, petechiae or rash. Rash is not papular, urticarial or vesicular.  Neurological:     Mental Status: She is alert.  Psychiatric:        Behavior: Behavior is cooperative.      Diagnostic studies:    Spirometry: results normal (FEV1: 1.81/102%, FVC: 2.20/93%, FEV1/FVC: 82%).    Spirometry consistent with normal pattern.   Allergy Studies: none        Malachi Bonds, MD  [facility]

## 2023-10-11 NOTE — Patient Instructions (Addendum)
Cough/asthma Continue albuterol 2 puffs once every 4 hours as needed for cough or wheeze You may use albuterol 2 puffs 5-15 minutes before activity to decrease cough or wheeze Keep up the coughing journal. If your symptoms re-occur, begin a journal of events that occurred for up to 6 hours before your symptoms began including foods and beverages consumed, soaps or perfumes you had contact with, and medications.   Allergic rhinitis/non-allergic rhinitis HOLD the ipratropium nasal spray up to three times a day as needed for a runny nose HOLD the fluticasone nasal spray 1-2 sprays in each nostril once a day as needed for a stuffy nose or drainage You can always add this back if needed.   Reflux Continue dietary and lifestyle modifications as listed below Continue pantoprazole twice a day as previously prescribed Can add famotidine 20 mg once a day for reflux symptoms.   Return in about 6 months (around 04/10/2024). You can have the follow up appointment with Dr. Dellis Anes or a Nurse Practicioner (our Nurse Practitioners are excellent and always have Physician oversight!).    Please inform us of any Emergency Department visits, hospitalizations, or changes in symptoms. Call us before going to the ED for breathing or allergy symptoms since we might be able to fit you in for a sick visit. Feel free to contact us anytime with any questions, problems, or concerns.  It was a pleasure to see you and your family again today!  Websites that have reliable patient information: 1. American Academy of Asthma, Allergy, and Immunology: www.aaaai.org 2. Food Allergy Research and Education (FARE): foodallergy.org 3. Mothers of Asthmatics: http://www.asthmacommunitynetwork.org 4. American College of Allergy, Asthma, and Immunology: www.acaai.org   COVID-19 Vaccine Information can be found at: PodExchange.nl For questions related to vaccine  distribution or appointments, please email [email or web address] or call [phone].     "Like" [place] on Facebook and Instagram for our latest updates!      A healthy democracy works best when [employer] participate! Make sure you are registered to vote! If you have moved or changed any of your contact information, you will need to get this updated before voting! Scan the QR codes below to learn more!

## 2023-11-13 DIAGNOSIS — I10 Essential (primary) hypertension: Secondary | ICD-10-CM | POA: Diagnosis not present

## 2023-11-13 DIAGNOSIS — W19XXXA Unspecified fall, initial encounter: Secondary | ICD-10-CM | POA: Diagnosis not present

## 2023-11-13 DIAGNOSIS — F325 Major depressive disorder, single episode, in full remission: Secondary | ICD-10-CM | POA: Diagnosis not present

## 2023-11-17 DIAGNOSIS — I10 Essential (primary) hypertension: Secondary | ICD-10-CM | POA: Diagnosis not present

## 2023-11-17 DIAGNOSIS — R296 Repeated falls: Secondary | ICD-10-CM | POA: Diagnosis not present

## 2023-11-17 DIAGNOSIS — F329 Major depressive disorder, single episode, unspecified: Secondary | ICD-10-CM | POA: Diagnosis not present

## 2023-11-17 DIAGNOSIS — R2689 Other abnormalities of gait and mobility: Secondary | ICD-10-CM | POA: Diagnosis not present

## 2023-12-08 DIAGNOSIS — W19XXXA Unspecified fall, initial encounter: Secondary | ICD-10-CM | POA: Diagnosis not present

## 2023-12-08 DIAGNOSIS — R2681 Unsteadiness on feet: Secondary | ICD-10-CM | POA: Diagnosis not present

## 2024-01-16 DIAGNOSIS — R296 Repeated falls: Secondary | ICD-10-CM | POA: Diagnosis not present

## 2024-01-16 DIAGNOSIS — I10 Essential (primary) hypertension: Secondary | ICD-10-CM | POA: Diagnosis not present

## 2024-01-16 DIAGNOSIS — F329 Major depressive disorder, single episode, unspecified: Secondary | ICD-10-CM | POA: Diagnosis not present

## 2024-01-16 DIAGNOSIS — R2689 Other abnormalities of gait and mobility: Secondary | ICD-10-CM | POA: Diagnosis not present

## 2024-02-13 ENCOUNTER — Other Ambulatory Visit: Payer: Self-pay | Admitting: Internal Medicine

## 2024-04-17 ENCOUNTER — Encounter: Payer: Self-pay | Admitting: Allergy & Immunology

## 2024-04-17 ENCOUNTER — Ambulatory Visit: Payer: HMO | Admitting: Allergy & Immunology

## 2024-04-17 VITALS — BP 140/70 | HR 71 | Temp 97.9°F | Resp 16

## 2024-04-17 DIAGNOSIS — K219 Gastro-esophageal reflux disease without esophagitis: Secondary | ICD-10-CM

## 2024-04-17 DIAGNOSIS — J310 Chronic rhinitis: Secondary | ICD-10-CM

## 2024-04-17 DIAGNOSIS — R053 Chronic cough: Secondary | ICD-10-CM | POA: Diagnosis not present

## 2024-04-17 MED ORDER — ALBUTEROL SULFATE HFA 108 (90 BASE) MCG/ACT IN AERS: 2.0000 | INHALATION_SPRAY | Freq: Four times a day (QID) | RESPIRATORY_TRACT | 2 refills | Status: AC | PRN

## 2024-04-17 MED ORDER — FAMOTIDINE 40 MG PO TABS
40.0000 mg | ORAL_TABLET | Freq: Every day | ORAL | 3 refills | Status: AC
Start: 2024-04-17 — End: ?

## 2024-04-17 MED ORDER — PANTOPRAZOLE SODIUM 40 MG PO TBEC: 40.0000 mg | DELAYED_RELEASE_TABLET | Freq: Two times a day (BID) | ORAL | 1 refills | Status: AC

## 2024-04-17 NOTE — Progress Notes (Signed)
 FOLLOW UP  Date of Service/Encounter:  04/17/24   Assessment:   Chronic cough - likely multifactorial in nature (restarting gabapentin )   Perennial allergic rhinitis (cat) - without relevant clinical history   History of stage Ia right breast cancer status post lumpectomy in February 2016 - recently discontinued anastrozole  September 2021   Osteopenia   History of OSA - was on CPAP for 25 + years  Plan/Recommendations:   Cough/asthma - This might be something that you have to live with. - We cannot figure out what is causing this.  - Continue with the Jolly Ranchers as needed. - Try using the albuterol  two puffs every 4-6 hours as needed for coughing to see if this helps at all.   Allergic rhinitis/non-allergic rhinitis - Continue to avoid the nose sprays.  - We can just hold the montelukast  since your symptoms have not changed much without it.   Reflux Continue dietary and lifestyle modifications as listed below Continue pantoprazole  twice a day as previously prescribed Add on Pepcid  (famotidine ) 20 mg once a day for reflux symptoms.   Return in about 6 months (around 10/17/2024). You can have the follow up appointment with Dr. Idolina Maker or a Nurse Practicioner (our Nurse Practitioners are excellent and always have Physician oversight!).   Subjective:   Crystal [name] is a 83 y.o. 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  Discussed the use of AI scribe software for clinical note transcription with the patient and/or guardian, who gave verbal consent to proceed.  Crystal [name] is a 83 y.o. female presenting for a follow up visit.  She was last seen in October 2024.  At that time, we continue with albuterol  as needed.  She was encouraged to keep a coughing journal.  For her mixed rhinitis, we stopped the Atrovent  and Flonase  nasal spray since she seems to be doing pretty well.  Reflux was controlled with Protonix  plus Pepcid .  Since last visit, she reports that she is not doing much better.  She experiences significant dizziness and imbalance, frequently stating 'I have no balance' and describing frequent falls. This has been a persistent issue, and she mentions that she would fall if she stood up suddenly.  Asthma/Respiratory Symptom History: She describes a chronic cough that occurs in episodes lasting about three weeks, followed by a quiet period. During these episodes, she coughs intensely, sometimes losing her breath. The cough is dry and starts with a tickle in her throat. She has tried various treatments, including albuterol , Symbicort , and nasal sprays, but none have provided relief. She uses cough drops like Jolly Ranchers to soothe her throat, although they are ineffective once a coughing spell begins. She notes that the cough can be triggered by eating crumbly foods like cornbread. She is not using  her albuterol  at all. It starts with a tickle in her throat.   She has a history of seeing a pulmonologist and has undergone a sinus CT, which was unremarkable. She has not had a recent chest X-ray. She is currently on pantoprazole  for reflux and has previously used montelukast , which she discontinued due to lack of efficacy. She also experiences dry mouth, particularly  at night, and has used a spray for this in the past.  She recently acquired new hearing aids, which have improved her hearing significantly. She mentions the financial burden of these aids, noting they are costly and not covered by Medicare.   Allergic Rhinitis Symptom History: She is not using her nose sprays. They never seemed to work at all. She is also on montelukast , which she has bene out of for a few weeks and has noted no worsening of symptoms.   Otherwise, there have been no changes to her past medical history, surgical history, family history, or social history.    Review of systems otherwise negative other than that mentioned in the HPI.    Objective:   Blood pressure (!) 150/70, pulse 71, temperature 97.9 F (36.6 C), resp. rate 16, SpO2 97%. There is no height or weight on file to calculate BMI.    Physical Exam Vitals reviewed.  Constitutional:      Appearance: She is well-developed.     Comments: Talkative.   HENT:     Head: Normocephalic and atraumatic.     Right Ear: Tympanic membrane, ear canal and external ear normal. No drainage, swelling or tenderness. Tympanic membrane is not injected, scarred, erythematous, retracted or bulging.     Left Ear: Tympanic membrane, ear canal and external ear normal. No drainage, swelling or tenderness. Tympanic membrane is not injected, scarred, erythematous, retracted or bulging.     Nose: No nasal deformity, septal deviation, mucosal edema or rhinorrhea.     Right Turbinates: Enlarged, swollen and pale.     Left Turbinates: Enlarged, swollen and pale.     Right Sinus: No maxillary sinus tenderness or frontal sinus tenderness.     Left Sinus: No maxillary sinus tenderness or frontal sinus tenderness.     Comments: Nasal passages are clear.     Mouth/Throat:     Mouth: Mucous membranes are not pale and not dry.     Pharynx: Uvula midline.  Eyes:     General: Lids are normal. Gaze aligned appropriately. No allergic shiner.        Right eye: No discharge.        Left eye: No discharge.     Conjunctiva/sclera: Conjunctivae normal.     Right eye: Right conjunctiva is not injected. No chemosis.    Left eye: Left conjunctiva is not injected. No chemosis.    Pupils: Pupils are equal, round, and reactive to light.  Cardiovascular:     Rate and Rhythm: Normal rate and regular rhythm.     Heart sounds: Normal heart sounds.  Pulmonary:     Effort: Pulmonary effort is normal. No tachypnea, accessory muscle usage or respiratory distress.     Breath sounds: Normal breath sounds. No wheezing, rhonchi or rales.     Comments: Intermittent coarse upper airway sounds.  No wheezing.  Moving air well in all lung fields. Chest:     Chest wall: No tenderness.  Abdominal:     Tenderness: There is no abdominal tenderness. There is no guarding or rebound.  Lymphadenopathy:     Head:  Right side of head: No submandibular, tonsillar or occipital adenopathy.     Left side of head: No submandibular, tonsillar or occipital adenopathy.     Cervical: No cervical adenopathy.  Skin:    Coloration: Skin is not pale.     Findings: No abrasion, erythema, petechiae or rash. Rash is not papular, urticarial or vesicular.  Neurological:     Mental Status: She is alert.  Psychiatric:        Behavior: Behavior is cooperative.      Diagnostic studies: none      Drexel Gentles, MD  [facility] 

## 2024-04-17 NOTE — Patient Instructions (Addendum)
 Cough/asthma - This might be something that you have to live with. - We cannot figure out what is causing this.  - Continue with the Jolly Ranchers as needed. - Try using the albuterol  two puffs every 4-6 hours as needed for coughing to see if this helps at all.   Allergic rhinitis/non-allergic rhinitis - Continue to avoid the nose sprays.  - We can just hold the montelukast  since your symptoms have not changed much without it.   Reflux Continue dietary and lifestyle modifications as listed below Continue pantoprazole  twice a day as previously prescribed Add on Pepcid  (famotidine ) 20 mg once a day for reflux symptoms.   Return in about 6 months (around 10/17/2024). You can have the follow up appointment with Dr. Idolina Maker or a Nurse Practicioner (our Nurse Practitioners are excellent and always have Physician oversight!).    Please inform us  of any Emergency Department visits, hospitalizations, or changes in symptoms. Call us  before going to the ED for breathing or allergy symptoms since we might be able to fit you in for a sick visit. Feel free to contact us  anytime with any questions, problems, or concerns.  It was a pleasure to see you and your family again today!  Websites that have reliable patient information: 1. American Academy of Asthma, Allergy, and Immunology: www.aaaai.org 2. Food Allergy Research and Education (FARE): foodallergy.org 3. Mothers of Asthmatics: http://www.asthmacommunitynetwork.org 4. American College of Allergy, Asthma, and Immunology: www.acaai.org   COVID-19 Vaccine Information can be found at: [email or web address] For questions related to vaccine distribution or appointments, please email [email or web address] or call [phone].     "Like" us  on Facebook and Instagram for our latest updates!      A healthy democracy works best when [employer] participate! Make sure you are registered to  vote! If you have moved or changed any of your contact information, you will need to get this updated before voting! Scan the QR codes below to learn more!

## 2024-06-24 DIAGNOSIS — R001 Bradycardia, unspecified: Secondary | ICD-10-CM | POA: Diagnosis not present

## 2024-06-24 DIAGNOSIS — I10 Essential (primary) hypertension: Secondary | ICD-10-CM | POA: Diagnosis not present

## 2024-06-24 DIAGNOSIS — R269 Unspecified abnormalities of gait and mobility: Secondary | ICD-10-CM | POA: Diagnosis not present

## 2024-06-24 DIAGNOSIS — M1990 Unspecified osteoarthritis, unspecified site: Secondary | ICD-10-CM | POA: Diagnosis not present

## 2024-06-24 DIAGNOSIS — I700 Atherosclerosis of aorta: Secondary | ICD-10-CM | POA: Diagnosis not present

## 2024-06-24 DIAGNOSIS — Z79899 Other long term (current) drug therapy: Secondary | ICD-10-CM | POA: Diagnosis not present

## 2024-06-24 DIAGNOSIS — E785 Hyperlipidemia, unspecified: Secondary | ICD-10-CM | POA: Diagnosis not present

## 2024-06-24 DIAGNOSIS — F329 Major depressive disorder, single episode, unspecified: Secondary | ICD-10-CM | POA: Diagnosis not present

## 2024-08-12 DIAGNOSIS — Z853 Personal history of malignant neoplasm of breast: Secondary | ICD-10-CM | POA: Diagnosis not present

## 2024-08-12 DIAGNOSIS — G4733 Obstructive sleep apnea (adult) (pediatric): Secondary | ICD-10-CM | POA: Diagnosis not present

## 2024-08-12 DIAGNOSIS — E785 Hyperlipidemia, unspecified: Secondary | ICD-10-CM | POA: Diagnosis not present

## 2024-08-12 DIAGNOSIS — Z0001 Encounter for general adult medical examination with abnormal findings: Secondary | ICD-10-CM | POA: Diagnosis not present

## 2024-08-12 DIAGNOSIS — F329 Major depressive disorder, single episode, unspecified: Secondary | ICD-10-CM | POA: Diagnosis not present

## 2024-08-12 DIAGNOSIS — I10 Essential (primary) hypertension: Secondary | ICD-10-CM | POA: Diagnosis not present

## 2024-08-12 DIAGNOSIS — I714 Abdominal aortic aneurysm, without rupture, unspecified: Secondary | ICD-10-CM | POA: Diagnosis not present

## 2024-10-02 DIAGNOSIS — D3132 Benign neoplasm of left choroid: Secondary | ICD-10-CM | POA: Diagnosis not present

## 2024-10-18 ENCOUNTER — Ambulatory Visit: Admitting: Allergy & Immunology

## 2024-11-11 DIAGNOSIS — M9902 Segmental and somatic dysfunction of thoracic region: Secondary | ICD-10-CM | POA: Diagnosis not present

## 2024-11-11 DIAGNOSIS — M542 Cervicalgia: Secondary | ICD-10-CM | POA: Diagnosis not present

## 2024-11-11 DIAGNOSIS — M9901 Segmental and somatic dysfunction of cervical region: Secondary | ICD-10-CM | POA: Diagnosis not present

## 2024-11-11 DIAGNOSIS — M546 Pain in thoracic spine: Secondary | ICD-10-CM | POA: Diagnosis not present

## 2024-11-18 DIAGNOSIS — M542 Cervicalgia: Secondary | ICD-10-CM | POA: Diagnosis not present

## 2024-11-18 DIAGNOSIS — M9902 Segmental and somatic dysfunction of thoracic region: Secondary | ICD-10-CM | POA: Diagnosis not present

## 2024-11-18 DIAGNOSIS — M9901 Segmental and somatic dysfunction of cervical region: Secondary | ICD-10-CM | POA: Diagnosis not present

## 2024-11-18 DIAGNOSIS — M546 Pain in thoracic spine: Secondary | ICD-10-CM | POA: Diagnosis not present

## 2024-11-25 DIAGNOSIS — M542 Cervicalgia: Secondary | ICD-10-CM | POA: Diagnosis not present

## 2024-11-25 DIAGNOSIS — M546 Pain in thoracic spine: Secondary | ICD-10-CM | POA: Diagnosis not present

## 2024-11-25 DIAGNOSIS — M9901 Segmental and somatic dysfunction of cervical region: Secondary | ICD-10-CM | POA: Diagnosis not present

## 2024-11-25 DIAGNOSIS — M9902 Segmental and somatic dysfunction of thoracic region: Secondary | ICD-10-CM | POA: Diagnosis not present

## 2024-12-02 DIAGNOSIS — M9901 Segmental and somatic dysfunction of cervical region: Secondary | ICD-10-CM | POA: Diagnosis not present

## 2024-12-02 DIAGNOSIS — M546 Pain in thoracic spine: Secondary | ICD-10-CM | POA: Diagnosis not present

## 2024-12-02 DIAGNOSIS — M9902 Segmental and somatic dysfunction of thoracic region: Secondary | ICD-10-CM | POA: Diagnosis not present

## 2024-12-02 DIAGNOSIS — M542 Cervicalgia: Secondary | ICD-10-CM | POA: Diagnosis not present
